# Patient Record
Sex: Male | Born: 1950
Health system: Southern US, Community
[De-identification: ages and names within clinical notes are randomized; demographics above are authoritative.]

## PROBLEM LIST (undated history)

## (undated) DIAGNOSIS — M549 Dorsalgia, unspecified: Secondary | ICD-10-CM

## (undated) DIAGNOSIS — I2510 Atherosclerotic heart disease of native coronary artery without angina pectoris: Secondary | ICD-10-CM

## (undated) DIAGNOSIS — J40 Bronchitis, not specified as acute or chronic: Secondary | ICD-10-CM

## (undated) DIAGNOSIS — T8859XA Other complications of anesthesia, initial encounter: Secondary | ICD-10-CM

## (undated) DIAGNOSIS — L853 Xerosis cutis: Secondary | ICD-10-CM

## (undated) DIAGNOSIS — E7800 Pure hypercholesterolemia, unspecified: Secondary | ICD-10-CM

## (undated) DIAGNOSIS — R0602 Shortness of breath: Secondary | ICD-10-CM

## (undated) DIAGNOSIS — M1990 Unspecified osteoarthritis, unspecified site: Secondary | ICD-10-CM

## (undated) DIAGNOSIS — G4730 Sleep apnea, unspecified: Secondary | ICD-10-CM

## (undated) DIAGNOSIS — G8929 Other chronic pain: Secondary | ICD-10-CM

## (undated) DIAGNOSIS — Z8719 Personal history of other diseases of the digestive system: Secondary | ICD-10-CM

## (undated) DIAGNOSIS — J189 Pneumonia, unspecified organism: Secondary | ICD-10-CM

## (undated) DIAGNOSIS — K219 Gastro-esophageal reflux disease without esophagitis: Secondary | ICD-10-CM

## (undated) DIAGNOSIS — T4145XA Adverse effect of unspecified anesthetic, initial encounter: Secondary | ICD-10-CM

## (undated) DIAGNOSIS — E119 Type 2 diabetes mellitus without complications: Secondary | ICD-10-CM

## (undated) DIAGNOSIS — I739 Peripheral vascular disease, unspecified: Secondary | ICD-10-CM

## (undated) HISTORY — PX: CORONARY ANGIOPLASTY: SHX604

## (undated) HISTORY — PX: SHOULDER SURGERY: SHX246

## (undated) HISTORY — PX: FOOT SURGERY: SHX648

## (undated) HISTORY — DX: Type 2 diabetes mellitus without complications: E11.9

## (undated) HISTORY — PX: UPPER GI ENDOSCOPY: SHX6162

---

## 1997-10-18 ENCOUNTER — Ambulatory Visit (HOSPITAL_COMMUNITY): Admission: RE | Admit: 1997-10-18 | Discharge: 1997-10-18 | Payer: Self-pay | Admitting: *Deleted

## 2000-11-17 ENCOUNTER — Encounter: Payer: Self-pay | Admitting: Emergency Medicine

## 2000-11-17 ENCOUNTER — Emergency Department (HOSPITAL_COMMUNITY): Admission: EM | Admit: 2000-11-17 | Discharge: 2000-11-17 | Payer: Self-pay | Admitting: Emergency Medicine

## 2000-11-22 ENCOUNTER — Encounter (INDEPENDENT_AMBULATORY_CARE_PROVIDER_SITE_OTHER): Payer: Self-pay

## 2000-11-22 ENCOUNTER — Ambulatory Visit (HOSPITAL_COMMUNITY): Admission: RE | Admit: 2000-11-22 | Discharge: 2000-11-22 | Payer: Self-pay | Admitting: *Deleted

## 2001-02-21 HISTORY — PX: OTHER SURGICAL HISTORY: SHX169

## 2001-11-05 ENCOUNTER — Encounter: Payer: Self-pay | Admitting: Internal Medicine

## 2001-11-05 ENCOUNTER — Encounter: Admission: RE | Admit: 2001-11-05 | Discharge: 2001-11-05 | Payer: Self-pay | Admitting: Internal Medicine

## 2001-11-12 ENCOUNTER — Encounter: Payer: Self-pay | Admitting: Internal Medicine

## 2001-11-12 ENCOUNTER — Ambulatory Visit (HOSPITAL_COMMUNITY): Admission: RE | Admit: 2001-11-12 | Discharge: 2001-11-13 | Payer: Self-pay | Admitting: Internal Medicine

## 2001-11-23 ENCOUNTER — Encounter: Payer: Self-pay | Admitting: Vascular Surgery

## 2001-11-27 ENCOUNTER — Encounter (INDEPENDENT_AMBULATORY_CARE_PROVIDER_SITE_OTHER): Payer: Self-pay | Admitting: *Deleted

## 2001-11-27 ENCOUNTER — Inpatient Hospital Stay (HOSPITAL_COMMUNITY): Admission: RE | Admit: 2001-11-27 | Discharge: 2001-12-04 | Payer: Self-pay | Admitting: Vascular Surgery

## 2001-11-27 ENCOUNTER — Encounter: Payer: Self-pay | Admitting: Vascular Surgery

## 2001-11-27 HISTORY — PX: AORTA - BILATERAL FEMORAL ARTERY BYPASS GRAFT: SHX1175

## 2001-11-28 ENCOUNTER — Encounter: Payer: Self-pay | Admitting: Vascular Surgery

## 2002-01-29 ENCOUNTER — Encounter: Payer: Self-pay | Admitting: Internal Medicine

## 2002-01-29 ENCOUNTER — Encounter: Admission: RE | Admit: 2002-01-29 | Discharge: 2002-01-29 | Payer: Self-pay | Admitting: Internal Medicine

## 2002-01-31 ENCOUNTER — Encounter: Payer: Self-pay | Admitting: Internal Medicine

## 2002-01-31 ENCOUNTER — Encounter: Admission: RE | Admit: 2002-01-31 | Discharge: 2002-01-31 | Payer: Self-pay | Admitting: Internal Medicine

## 2002-02-21 HISTORY — PX: BACK SURGERY: SHX140

## 2002-03-14 ENCOUNTER — Ambulatory Visit (HOSPITAL_COMMUNITY): Admission: RE | Admit: 2002-03-14 | Discharge: 2002-03-14 | Payer: Self-pay | Admitting: Neurosurgery

## 2002-03-14 ENCOUNTER — Encounter: Payer: Self-pay | Admitting: Neurosurgery

## 2002-07-01 ENCOUNTER — Encounter: Admission: RE | Admit: 2002-07-01 | Discharge: 2002-07-01 | Payer: Self-pay | Admitting: Neurosurgery

## 2002-07-01 ENCOUNTER — Encounter: Payer: Self-pay | Admitting: Neurosurgery

## 2002-07-01 ENCOUNTER — Encounter: Payer: Self-pay | Admitting: Diagnostic Radiology

## 2002-07-15 ENCOUNTER — Encounter: Admission: RE | Admit: 2002-07-15 | Discharge: 2002-07-15 | Payer: Self-pay | Admitting: Neurosurgery

## 2002-07-15 ENCOUNTER — Encounter: Payer: Self-pay | Admitting: Neurosurgery

## 2002-07-30 ENCOUNTER — Encounter: Payer: Self-pay | Admitting: Neurosurgery

## 2002-07-30 ENCOUNTER — Encounter: Admission: RE | Admit: 2002-07-30 | Discharge: 2002-07-30 | Payer: Self-pay | Admitting: Neurosurgery

## 2002-09-17 ENCOUNTER — Ambulatory Visit (HOSPITAL_COMMUNITY): Admission: RE | Admit: 2002-09-17 | Discharge: 2002-09-17 | Payer: Self-pay | Admitting: *Deleted

## 2002-09-17 ENCOUNTER — Encounter (INDEPENDENT_AMBULATORY_CARE_PROVIDER_SITE_OTHER): Payer: Self-pay | Admitting: Specialist

## 2002-10-01 ENCOUNTER — Encounter: Payer: Self-pay | Admitting: Neurosurgery

## 2002-10-03 ENCOUNTER — Inpatient Hospital Stay (HOSPITAL_COMMUNITY): Admission: RE | Admit: 2002-10-03 | Discharge: 2002-10-05 | Payer: Self-pay | Admitting: Neurosurgery

## 2002-10-03 ENCOUNTER — Encounter: Payer: Self-pay | Admitting: Neurosurgery

## 2003-05-06 ENCOUNTER — Encounter: Admission: RE | Admit: 2003-05-06 | Discharge: 2003-05-06 | Payer: Self-pay | Admitting: Neurosurgery

## 2004-02-10 ENCOUNTER — Ambulatory Visit (HOSPITAL_COMMUNITY): Admission: RE | Admit: 2004-02-10 | Discharge: 2004-02-10 | Payer: Self-pay | Admitting: *Deleted

## 2004-02-10 ENCOUNTER — Encounter (INDEPENDENT_AMBULATORY_CARE_PROVIDER_SITE_OTHER): Payer: Self-pay | Admitting: Specialist

## 2006-01-10 ENCOUNTER — Encounter: Admission: RE | Admit: 2006-01-10 | Discharge: 2006-01-10 | Payer: Self-pay | Admitting: Family Medicine

## 2006-02-03 ENCOUNTER — Encounter: Admission: RE | Admit: 2006-02-03 | Discharge: 2006-02-03 | Payer: Self-pay | Admitting: Neurosurgery

## 2006-05-22 ENCOUNTER — Encounter (INDEPENDENT_AMBULATORY_CARE_PROVIDER_SITE_OTHER): Payer: Self-pay | Admitting: Specialist

## 2006-05-22 ENCOUNTER — Ambulatory Visit (HOSPITAL_COMMUNITY): Admission: RE | Admit: 2006-05-22 | Discharge: 2006-05-22 | Payer: Self-pay | Admitting: *Deleted

## 2006-09-26 ENCOUNTER — Encounter: Admission: RE | Admit: 2006-09-26 | Discharge: 2006-09-26 | Payer: Self-pay | Admitting: Family Medicine

## 2006-10-09 ENCOUNTER — Ambulatory Visit (HOSPITAL_COMMUNITY): Admission: RE | Admit: 2006-10-09 | Discharge: 2006-10-10 | Payer: Self-pay | Admitting: Cardiology

## 2006-10-17 ENCOUNTER — Encounter: Admission: RE | Admit: 2006-10-17 | Discharge: 2006-10-17 | Payer: Self-pay | Admitting: Cardiology

## 2006-11-03 ENCOUNTER — Ambulatory Visit (HOSPITAL_COMMUNITY): Admission: RE | Admit: 2006-11-03 | Discharge: 2006-11-03 | Payer: Self-pay | Admitting: Cardiology

## 2007-05-29 ENCOUNTER — Encounter: Admission: RE | Admit: 2007-05-29 | Discharge: 2007-05-29 | Payer: Self-pay | Admitting: Neurosurgery

## 2007-08-23 ENCOUNTER — Ambulatory Visit (HOSPITAL_COMMUNITY): Admission: RE | Admit: 2007-08-23 | Discharge: 2007-08-23 | Payer: Self-pay | Admitting: *Deleted

## 2007-08-30 ENCOUNTER — Ambulatory Visit (HOSPITAL_COMMUNITY): Admission: RE | Admit: 2007-08-30 | Discharge: 2007-08-30 | Payer: Self-pay | Admitting: *Deleted

## 2007-08-30 ENCOUNTER — Encounter (INDEPENDENT_AMBULATORY_CARE_PROVIDER_SITE_OTHER): Payer: Self-pay | Admitting: *Deleted

## 2008-02-22 HISTORY — PX: CARDIAC CATHETERIZATION: SHX172

## 2008-07-08 ENCOUNTER — Encounter: Admission: RE | Admit: 2008-07-08 | Discharge: 2008-07-08 | Payer: Self-pay | Admitting: Family Medicine

## 2008-08-18 ENCOUNTER — Encounter: Admission: RE | Admit: 2008-08-18 | Discharge: 2008-08-18 | Payer: Self-pay | Admitting: Family Medicine

## 2008-08-24 ENCOUNTER — Inpatient Hospital Stay (HOSPITAL_COMMUNITY): Admission: EM | Admit: 2008-08-24 | Discharge: 2008-08-27 | Payer: Self-pay | Admitting: Emergency Medicine

## 2008-09-04 ENCOUNTER — Encounter (HOSPITAL_COMMUNITY): Admission: RE | Admit: 2008-09-04 | Discharge: 2008-12-03 | Payer: Self-pay | Admitting: Cardiology

## 2008-12-04 ENCOUNTER — Encounter (HOSPITAL_COMMUNITY): Admission: RE | Admit: 2008-12-04 | Discharge: 2009-02-18 | Payer: Self-pay | Admitting: Cardiology

## 2009-04-15 ENCOUNTER — Encounter: Admission: RE | Admit: 2009-04-15 | Discharge: 2009-04-15 | Payer: Self-pay | Admitting: Family Medicine

## 2009-08-04 HISTORY — PX: COLONOSCOPY: SHX174

## 2010-01-13 ENCOUNTER — Encounter: Admission: RE | Admit: 2010-01-13 | Discharge: 2010-01-13 | Payer: Self-pay | Admitting: Advanced Practice Midwife

## 2010-01-22 ENCOUNTER — Observation Stay (HOSPITAL_COMMUNITY)
Admission: EM | Admit: 2010-01-22 | Discharge: 2010-01-24 | Payer: Self-pay | Source: Home / Self Care | Admitting: Emergency Medicine

## 2010-05-03 LAB — CARDIAC PANEL(CRET KIN+CKTOT+MB+TROPI)
CK, MB: 1 ng/mL (ref 0.3–4.0)
Relative Index: 1.3 (ref 0.0–2.5)
Total CK: 71 U/L (ref 7–232)
Troponin I: 0.01 ng/mL (ref 0.00–0.06)
Troponin I: 0.02 ng/mL (ref 0.00–0.06)

## 2010-05-03 LAB — TROPONIN I: Troponin I: <0.01 ng/mL (ref 0.00–0.06)

## 2010-05-03 LAB — CULTURE, BLOOD (ROUTINE X 2)
Culture  Setup Time: 201112031729
Culture  Setup Time: 201112031729
Culture: NO GROWTH

## 2010-05-03 LAB — CBC
HCT: 40.5 % (ref 39.0–52.0)
Hemoglobin: 13.6 g/dL (ref 13.0–17.0)
Hemoglobin: 16.1 g/dL (ref 13.0–17.0)
MCHC: 33.6 g/dL (ref 30.0–36.0)
MCHC: 34.5 g/dL (ref 30.0–36.0)
MCV: 89.8 fL (ref 78.0–100.0)
RBC: 5.26 MIL/uL (ref 4.22–5.81)
RDW: 13.5 % (ref 11.5–15.5)
WBC: 11.5 10*3/uL — ABNORMAL HIGH (ref 4.0–10.5)

## 2010-05-03 LAB — URINALYSIS, ROUTINE W REFLEX MICROSCOPIC
Glucose, UA: NEGATIVE mg/dL
Ketones, ur: NEGATIVE mg/dL
Nitrite: NEGATIVE
Specific Gravity, Urine: 1.003 — ABNORMAL LOW (ref 1.005–1.030)
pH: 7 (ref 5.0–8.0)

## 2010-05-03 LAB — POCT CARDIAC MARKERS: Troponin i, poc: <0.05 ng/mL (ref 0.00–0.09)

## 2010-05-03 LAB — LIPID PANEL
LDL Cholesterol: 81 mg/dL (ref 0–99)
Total CHOL/HDL Ratio: 5.3 RATIO
VLDL: 38 mg/dL (ref 0–40)

## 2010-05-03 LAB — DIFFERENTIAL
Basophils Absolute: 0.1 10*3/uL (ref 0.0–0.1)
Eosinophils Absolute: 0.4 10*3/uL (ref 0.0–0.7)
Eosinophils Relative: 3 % (ref 0–5)
Lymphocytes Relative: 27 % (ref 12–46)
Monocytes Relative: 8 % (ref 3–12)
Neutro Abs: 7 10*3/uL (ref 1.7–7.7)
Neutrophils Relative %: 61 % (ref 43–77)

## 2010-05-03 LAB — BASIC METABOLIC PANEL
BUN: 14 mg/dL (ref 6–23)
Calcium: 8.8 mg/dL (ref 8.4–10.5)
Calcium: 9.8 mg/dL (ref 8.4–10.5)
Creatinine, Ser: 1.09 mg/dL (ref 0.4–1.5)
GFR calc Af Amer: >60 mL/min (ref >60)
GFR calc non Af Amer: >60 mL/min (ref >60)
GFR calc non Af Amer: >60 mL/min (ref >60)
Glucose, Bld: 137 mg/dL — ABNORMAL HIGH (ref 70–99)
Glucose, Bld: 82 mg/dL (ref 70–99)
Potassium: 4 mEq/L (ref 3.5–5.1)
Sodium: 140 mEq/L (ref 135–145)

## 2010-05-03 LAB — CK TOTAL AND CKMB (NOT AT ARMC)
CK, MB: 1.6 ng/mL (ref 0.3–4.0)
Relative Index: 1.6 (ref 0.0–2.5)
Total CK: 101 U/L (ref 7–232)

## 2010-05-03 LAB — URINE CULTURE
Colony Count: NO GROWTH
Culture  Setup Time: 201112021929

## 2010-05-03 LAB — BRAIN NATRIURETIC PEPTIDE: Pro B Natriuretic peptide (BNP): <30 pg/mL (ref 0.0–100.0)

## 2010-05-03 LAB — MRSA PCR SCREENING: MRSA by PCR: NEGATIVE

## 2010-05-30 LAB — CARDIAC PANEL(CRET KIN+CKTOT+MB+TROPI)
CK, MB: 0.7 ng/mL (ref 0.3–4.0)
CK, MB: 0.8 ng/mL (ref 0.3–4.0)
Total CK: 109 U/L (ref 7–232)

## 2010-05-30 LAB — CBC
HCT: 39.3 % (ref 39.0–52.0)
HCT: 41.4 % (ref 39.0–52.0)
HCT: 45.1 % (ref 39.0–52.0)
Hemoglobin: 13.4 g/dL (ref 13.0–17.0)
Hemoglobin: 14 g/dL (ref 13.0–17.0)
Hemoglobin: 15.6 g/dL (ref 13.0–17.0)
MCV: 92.9 fL (ref 78.0–100.0)
Platelets: 191 10*3/uL (ref 150–400)
RBC: 4.21 MIL/uL — ABNORMAL LOW (ref 4.22–5.81)
RBC: 4.24 MIL/uL (ref 4.22–5.81)
WBC: 6.4 10*3/uL (ref 4.0–10.5)
WBC: 7.1 10*3/uL (ref 4.0–10.5)
WBC: 7.4 10*3/uL (ref 4.0–10.5)
WBC: 8.5 10*3/uL (ref 4.0–10.5)

## 2010-05-30 LAB — COMPREHENSIVE METABOLIC PANEL
ALT: 14 U/L (ref 0–53)
Alkaline Phosphatase: 104 U/L (ref 39–117)
CO2: 24 mEq/L (ref 19–32)
Chloride: 107 mEq/L (ref 96–112)
GFR calc non Af Amer: >60 mL/min (ref >60)
Glucose, Bld: 85 mg/dL (ref 70–99)
Potassium: 4 mEq/L (ref 3.5–5.1)
Sodium: 138 mEq/L (ref 135–145)
Total Bilirubin: 0.9 mg/dL (ref 0.3–1.2)
Total Protein: 7.1 g/dL (ref 6.0–8.3)

## 2010-05-30 LAB — DIFFERENTIAL
Eosinophils Absolute: 0.1 10*3/uL (ref 0.0–0.7)
Eosinophils Relative: 1 % (ref 0–5)
Lymphocytes Relative: 27 % (ref 12–46)
Lymphs Abs: 2.3 10*3/uL (ref 0.7–4.0)
Monocytes Absolute: 0.5 10*3/uL (ref 0.1–1.0)

## 2010-05-30 LAB — POCT CARDIAC MARKERS
CKMB, poc: <1 ng/mL — ABNORMAL LOW (ref 1.0–8.0)
Myoglobin, poc: 48.4 ng/mL (ref 12–200)
Troponin i, poc: <0.05 ng/mL (ref 0.00–0.09)

## 2010-05-30 LAB — POCT I-STAT, CHEM 8
BUN: 9 mg/dL (ref 6–23)
Creatinine, Ser: 0.9 mg/dL (ref 0.4–1.5)
Potassium: 4 mEq/L (ref 3.5–5.1)
Sodium: 139 mEq/L (ref 135–145)
TCO2: 23 mmol/L (ref 0–100)

## 2010-05-30 LAB — BASIC METABOLIC PANEL
BUN: 5 mg/dL — ABNORMAL LOW (ref 6–23)
Chloride: 107 mEq/L (ref 96–112)
Potassium: 4.1 mEq/L (ref 3.5–5.1)

## 2010-05-30 LAB — HEPARIN LEVEL (UNFRACTIONATED): Heparin Unfractionated: 0.62 IU/mL (ref 0.30–0.70)

## 2010-05-30 LAB — PROTIME-INR: Prothrombin Time: 12.9 seconds (ref 11.6–15.2)

## 2010-05-30 LAB — CK TOTAL AND CKMB (NOT AT ARMC)
Relative Index: INVALID (ref 0.0–2.5)
Total CK: 80 U/L (ref 7–232)

## 2010-07-06 NOTE — Discharge Summary (Signed)
NAME:  Ray Brown, Ray Brown               ACCOUNT NO.:  [id number]   MEDICAL RECORD NO.:  [id number]          PATIENT TYPE:  INP   LOCATION:  2508                         FACILITY:  [facility]   PHYSICIAN:  Jake Bathe, MD      DATE OF BIRTH:  1950/08/31   DATE OF ADMISSION:  08/24/2008  DATE OF DISCHARGE:  08/27/2008                               DISCHARGE SUMMARY   FINAL DIAGNOSES:  1. Coronary artery disease.  2. Hypertension.  3. Hyperlipidemia.  4. Barrett esophagus.  5. Gastroesophageal reflux disease.  6. Hiatal hernia.  7. History of pneumonia.  8. Peripheral vascular disease, status post aortobifemoral bypass      graft.   PROCEDURES:  Cardiac catheterization performed on August 26, 2008,  demonstrated high-grade in-stent restenosis of mid right coronary  artery.  Drug-eluting stent to the mid right coronary artery was placed  - 2.75 x 23 mm XIENCE stent.  LVEF was 55% with no other abnormalities.   LABORATORIES ON DISCHARGE:  Creatinine 0.85 and potassium 4.1.  White  count 6.4, hemoglobin 14, hematocrit 41.4, and platelets 191.  CT  angiogram of the abdomen, pelvic, chest due to admission with severe  chest pain radiating to back showed no abnormalities with aorta or stent  or bypass grafts.   BRIEF HOSPITAL COURSE:  A 60 year old male with severe peripheral  vascular disease, status post aortobifemoral bypass in October 2003 with  coronary artery stenting with bare-metal stent in August 2008 to his  right coronary artery with hypertension, hyperlipidemia with severe mid  sternal pain radiating to his back which severe, felt as if something  had kicked him in the chest.  He was worked up as above with CT  angiogram showing no evidence of dissection.  He then went on to cardiac  catheterization where bare-metal stent showed in-stent restenosis and a  drug-eluting stent was placed.  He tolerated this procedure well.   PHYSICAL EXAMINATION:  VITAL SIGNS:  Temperature 97.9, pulse  57,  respirations 20, blood pressure 123/61, and sating 95% on room air.  GENERAL:  Alert and oriented x3, in no acute distress.  LUNGS:  Clear to auscultation bilaterally.  ABDOMEN:  Soft, nontender, and normoactive bowel sounds.  EXTREMITIES:  No clubbing, cyanosis, or edema.  Cath site clean, dry,  and intact, and ambulating well.   DISCHARGE MEDICATIONS:  1. Plavix 75 mg once a day.  2. Aspirin 325 mg a day.  3. Lipitor 20 mg a day.  4. Protonix 40 mg a day.  5. Niaspan 1000 mg a day.  6. Metoprolol tartrate 25 mg twice a day.  7. Lasix 20 mg a day.  8. NTG 0.4 mg SL PRN  9. Fish oil 1000 mg twice a day.   HOSPITAL FOLLOWUP:  He will be seeing me in 2 weeks posthospitalization.  He knows to contact me immediately if any problems arise with his  catheterization site.  Once again, discharge medications have been  reviewed and correlate with office medications.   Discharge time >31minutes.      Jake Bathe, MD  Electronically  Signed     MCS/MEDQ  D:  10/13/2008  T:  10/13/2008  Job:  [id number]

## 2010-07-06 NOTE — Cardiovascular Report (Signed)
NAME:  Ray Brown, Ray Brown               ACCOUNT NO.:  [id number]   MEDICAL RECORD NO.:  [id number]          PATIENT TYPE:  OIB   LOCATION:  6527                         FACILITY:  [facility]   PHYSICIAN:  Corky Crafts, MDDATE OF BIRTH:  November 28, 1950   DATE OF PROCEDURE:  DATE OF DISCHARGE:                            CARDIAC CATHETERIZATION   PROCEDURES PERFORMED:  PCI of the right coronary artery.   OPERATOR:  Dr. Eldridge Dace.   INDICATIONS:  Stable angina and peripheral arterial disease.   PROCEDURE NARRATIVE:  The diagnostic catheterization was performed by  Dr. Anne Fu.  This revealed an 80% mid-right coronary artery lesion.  A  JR-4 guiding catheter was used to engage the ostium of right coronary  artery.  A Prowater wire was placed across the lesion.  The lesion was a  short 80% lesion.  However, there is a small ectatic segment just past  the lesion.  Because this ectatic segment was out of proportion to the  rest of the vessel in terms of size, I thought a drug-eluting stent  would be difficult to apposed against all of the vessel wall.  Therefore, we went with a bare metal stent which I think would be easier  to endothelialize.  A 2.5 x 9-mm Maverick balloon was placed across the  lesion and inflated to 8 atmospheres for 29 seconds and then again to 8  atmospheres for 14 seconds.  A 2.5 x 16-mm Express stent was then placed  across the lesion.  It did cover the entire ectatic segment and extend  proximal to the lesion.  There was disease all the way up to an acute  marginal branch.  The stent was deployed at 14 atmospheres for 46  seconds.  There is a good angiographic result.  The ectatic segment  appeared to be well treated.  The tightest portion of the initial artery  was then post dilated with a 2.75 x 10 mm dura Star inflated to 20  atmospheres for 56 seconds and then to 20 atmospheres for 17 seconds to  post dilate the midportion of the stent.  There is a small irregularity  noted at the proximal edge of the stent.  This improved significantly  with 200 mcg of intracoronary nitroglycerin.  There was an excellent  angiographic result.  TIMI III flow was maintained throughout.  There  was no residual stenosis.   IMPRESSION:  1. Successful PCI of the right coronary artery with a bare metal      stent.  A bare metal stent was used because of the ectatic segment      as noted above.  2. Other moderate atherosclerosis including a 75% lesion in a small      posterolateral branch which was not amendable to PCI.   RECOMMENDATIONS:  The patient will continue with aspirin and Plavix  indefinitely.  He will be watched overnight.  Angiomax was used for  anticoagulation.  The sheath will be removed using manual compression.      Corky Crafts, MD  Electronically Signed     JSV/MEDQ  D:  10/09/2006  T:  10/10/2006  Job:  [id number]   cc:   Jake Bathe, MD  Donia Guiles, M.D.

## 2010-07-06 NOTE — Cardiovascular Report (Signed)
NAME:  Ray Brown, Ray Brown               ACCOUNT NO.:  [id number]   MEDICAL RECORD NO.:  [id number]          PATIENT TYPE:  INP   LOCATION:  2508                         FACILITY:  [facility]   PHYSICIAN:  Jake Bathe, MD      DATE OF BIRTH:  May 30, 1950   DATE OF PROCEDURE:  08/26/2008  DATE OF DISCHARGE:                            CARDIAC CATHETERIZATION   PROCEDURES:  1. Left heart catheterization.  2. Selective coronary angiography.  3. Left ventriculogram.   INDICATIONS:  A 60 year old male with unstable angina who was admitted  with severe chest pain radiating to his back.  He did not have any  evidence of dissection.  He has prior aortobifemoral bypasses.  Hyperlipidemia, peripheral vascular disease as above.  Yesterday, had a  vagal episode with 6-second pause while eating.   PROCEDURE DETAILS:  Informed consent was obtained.  Risk of stroke,  heart attack, death, renal impairment, bleeding, arterial damage, graft  damage were explained to the patient and his wife at length.  Lidocaine  1% was used for local anesthesia.  Visualization of femoral head with  fluoroscopy was obtained.  Using the modified Seldinger technique, a 5-  French sheath was placed into the graft right side.  A Judkins left #4  catheter and a Judkins right #4 catheter were used to selectively  cannulate the left coronary and right coronary artery.  Multiple views  with hand injection of Omnipaque were obtained.  An angled pigtail was  used crossing the left ventricle.  A 30 mL power injection shot in the  RAO position was performed for left ventriculogram.  Pullback was  obtained.  Hemodynamics obtained.  Following procedure, case was  discussed with Dr. Katrinka Blazing and the patient.   FINDINGS:  1. Left main artery.  No angiographically significant coronary artery      disease.  2. Left anterior descending artery - there are 3 diagonal branches,      small in caliber.  In the mid to distal segment, there is a  myocardial bridge present.  No angiographically significant      coronary artery disease.  There is some proximal calcification      noted.  3. Circumflex artery - there is some minor irregularity up to 30% in      the proximal circumflex region.  Otherwise, no angiographically      significant coronary artery disease.  4. Right coronary artery - there is 90% in-stent stenosis in the      previously placed 2.5 x 16-mm bare-metal stent, which was placed in      2008.  Minor irregularities throughout vessel.  5. Left ventriculogram - normal left ventricular ejection fraction of      55% with no wall motion abnormality.   HEMODYNAMICS:  Left ventricular systolic pressure 100 with an end-  diastolic pressure of 9 mmHg, aortic pressure 100/57 with a mean of 75  mmHg.   IMPRESSION:  Severe in-stent restenosis of right coronary artery mid  bare-metal stent of 90%.   PLAN:  Findings discussed with Dr. Verdis Prime, who will plan  percutaneous  intervention with drug-eluting stent.  Note:  The patient  does have a femoral bypass.      Jake Bathe, MD  Electronically Signed     MCS/MEDQ  D:  08/26/2008  T:  08/26/2008  Job:  [id number]   cc:   Donia Guiles, M.D.

## 2010-07-06 NOTE — Cardiovascular Report (Signed)
NAME:  Ray Brown, Ray Brown               ACCOUNT NO.:  [id number]   MEDICAL RECORD NO.:  [id number]          PATIENT TYPE:  OIB   LOCATION:  6527                         FACILITY:  [facility]   PHYSICIAN:  Jake Bathe, MD           DATE OF BIRTH:   DATE OF PROCEDURE:  10/09/2006  DATE OF DISCHARGE:                            CARDIAC CATHETERIZATION   INDICATIONS:  Mr. Ray Brown is a 60 year old male with peripheral arterial  disease status post aortofemoral bypass and right iliac stenting with  hyperlipidemia and former tobacco use, who was seen in consultation by  me and deemed to have stable angina.   Procedure: Patient was consented.  Risks and benefits of catheterization  were explained.  The patient was draped in a sterile fashion and placed  on the catheterization table.  His right groin was infiltrated with 1%  lidocaine for local anesthesia.  He was also given 1 mg of Versed and 25  mcg of fentanyl for mild conscious sedation.  Fluoroscopy of the groin  was performed to visualize femoral head.  Utilizing the modified  Seldinger technique, the right femoral artery was cannulated, and a #6  French sheath was placed in the right groin.  Utilizing a Wholey wire  and a JL4 catheter, selective angiography of the left coronary system  was performed with hand injections.  Catheter exchanges were made over  the wire.  A Judkins right 4 catheter was then used for selective  angiography of the right coronary artery, then 200 mcg of intracoronary  nitro was administered to the left coronary system via hand injection.  A pigtail catheter was then placed in the ascending aorta, then under  fluoroscopic guidance passed into the left ventricle.  Pressures were  recorded.  A left ventriculography was then performed with power  injection in the RAO position.  A pullback was performed across the  aortic valve.  Then an abdominal aortogram was performed with digital  subtraction angiography to visualize the  renal arteries.   FINDINGS:  1. Left main.  No significant disease.  Branches into LAD and left      circumflex.  2. Left anterior descending artery.  The mid segment of the left      anterior descending artery is somewhat small in caliber and did      expand slightly upon injection of intracoronary nitroglycerin.      There appears to be a small hinge point and/or a bridging of the      LAD.  The LAD continues to wrap around the apex.  There are 4      diagonal branches which are small in caliber, off  of the LAD.  3. Left circumflex.  There is a large obtuse marginal which is      branching off the circumflex system.  There are minor      irregularities throughout.  4. Right coronary artery.  There is heavy proximal calcification      noted.  There is a focal 80%  mid RCA lesion with an ectatic  aneurysmal segment.  There is a distal small-caliber posterolateral      branch with 75% stenosis not amenable to PCI.  5. Abdominal aortogram.  There is no renal artery stenosis.  6. Hemodynamics.  LV pressures were 108/4, with an LVEDP of 8.  Aortic      pressure was 108/64, with a mean of 82.  There was no gradient.  7. Left ventriculogram.  Normal LV systolic function, EF 60%.   IMPRESSIONS:  1. Coronary artery disease as described above, with a focal right      coronary artery 80% lesion which should be amenable to percutaneous      coronary intervention. I reviewed the films with Dr. Catalina Gravel,      who will perform percutaneous intervention with a bare metal stent      to his right coronary artery (see intervention note).  2. Normal left ventricular systolic function, with ejection fraction      of 60%.  3. No renal artery stenosis.  4. Will be placed on Plavix in addition to his other outpatient      medications including Lipitor, Niaspan, Toprol and aspirin.  Will      follow up with me in clinic after observation overnight.      Jake Bathe, MD  Electronically  Signed     MCS/MEDQ  D:  10/09/2006  T:  10/10/2006  Job:  [id number]   cc:   Donia Guiles, M.D.  Corky Crafts, MD

## 2010-07-06 NOTE — Op Note (Signed)
NAME:  Ray Brown, Ray Brown               ACCOUNT NO.:  [id number]   MEDICAL RECORD NO.:  [id number]          PATIENT TYPE:  AMB   LOCATION:  ENDO                         FACILITY:  [facility]   PHYSICIAN:  Georgiana Spinner, M.D.    DATE OF BIRTH:  1950-10-30   DATE OF PROCEDURE:  08/30/2007  DATE OF DISCHARGE:                               OPERATIVE REPORT   PROCEDURE:  Upper endoscopy with dilation and biopsy.   ANESTHESIA:  1. Fentanyl 50 mcg.  2. Versed 5 mg.   PROCEDURE:  With the patient mildly sedated in the left lateral  decubitus position, the Pentax videoscopic endoscope was inserted in the  mouth and passed under direct vision through the esophagus, which  appeared normal, until we reached distal esophagus, and there were  changes of Barrett's esophagus.  We entered into the stomach.  Fundus,  body, antrum, duodenal bulb, and second portion of the duodenum were  visualized.  From this point, the endoscope was slowly withdrawn, taking  circumferential views of the duodenal mucosa until the endoscope had  been pulled back into the stomach and placed in retroflexion to view the  stomach from below, and there appeared to be a tight wrap of the GE  junction around the endoscope, which was photographed.  The endoscope  was then straightened, and a guidewire was passed.  Subsequently, Savary  dilators 15 and 17 were passed rather easily, with no blood seen on  either dilator.  With the latter, the guidewire was removed.  The  endoscope was reinserted.  No bleeding was noted, and biopsies were  taken of the area of Barrett's esophagus.  The endoscope was withdrawn.  The patient's vital signs and pulse oximeter remained stable.  The  patient tolerated the procedure well, without apparent complications.   FINDINGS:  Barrett's esophagus, biopsied, with dilation of distal  esophagus to 15 and 17 Savary.   PLAN:  Await clinical response and biopsy report.  The patient will call  me for results  and follow up with me as needed as an outpatient.           ______________________________  Georgiana Spinner, M.D.     GMO/MEDQ  D:  08/30/2007  T:  08/30/2007  Job:  [id number]   cc:   Donia Guiles, M.D.  Fax: [phone]

## 2010-07-06 NOTE — Cardiovascular Report (Signed)
NAME:  Ray Brown, Ray Brown               ACCOUNT NO.:  [id number]   MEDICAL RECORD NO.:  [id number]          PATIENT TYPE:  INP   LOCATION:  2508                         FACILITY:  [facility]   PHYSICIAN:  Lyn Records, M.D.   DATE OF BIRTH:  24-Dec-1950   DATE OF PROCEDURE:  08/26/2008  DATE OF DISCHARGE:                            CARDIAC CATHETERIZATION   INDICATION:  The patient presented with atypical chest pain.  He had an  episode of sinus arrest with near syncope after being admitted to the  unit.  Markers have been negative.  Cath today by Dr. Anne Fu  demonstrated high-grade in-stent restenosis in the mid-right coronary in  the 80-90% range.   PROCEDURE PERFORMED:  Drug-eluting stent mid-right coronary artery in-  stent restenosis.   DESCRIPTION:  The diagnostic procedure was performed by Dr. Anne Fu.  We  discussed with the patient the proposed procedure of DES stenting of the  right coronary ISR region.   A 6-French side-hole right coronary artery guide catheter was used.  A  0.75 mg/kg bolus of Angiomax was given followed by 1.75 mg/kg/hour  infusion.  ACT was 460 seconds.  Plavix 300 mg was administered.   A BMW wire was used across the region of stenosis in the right coronary  artery.  A 10-mm long 2.75-mm cutting balloon was then used for  predilatation.  We then performed stenting of the mid right coronary  artery using a 2.75- x 23-mm Xience V.  The stent was deployed to 10  atmospheres.  We then postdilated with a 15-mm long x 3.0-mm Quantum  Apex balloon to 13 atmospheres.  Three balloon inflations were  performed.  Postprocedure, a beautiful angiographic result was noted.  A  0% stenosis was seen.  No dissection was noted.  TIMI III was noted.   Angiomax was discontinued poststenting.   CONCLUSION:  Successful PCI of the right coronary for ISR within a bare-  metal stent (deployed in 2008) from 85% to 0% with TIMI grade 3 flow.   PLAN:  Plavix for at least a year.   Further management per Dr. Anne Fu.      Lyn Records, M.D.  Electronically Signed     HWS/MEDQ  D:  08/26/2008  T:  08/26/2008  Job:  [id number]   cc:   Jake Bathe, MD  Donia Guiles, M.D.

## 2010-07-06 NOTE — Discharge Summary (Signed)
NAME:  Delpilar, Tasha               ACCOUNT NO.:  [id number]   MEDICAL RECORD NO.:  [id number]          PATIENT TYPE:  OIB   LOCATION:  6527                         FACILITY:  [facility]   PHYSICIAN:  Jake Bathe, MD      DATE OF BIRTH:  September 15, 1950   DATE OF ADMISSION:  10/09/2006  DATE OF DISCHARGE:  10/10/2006                               DISCHARGE SUMMARY   DISCHARGE DIAGNOSES:  1. Coronary artery disease status post bare-metal stent to the right      coronary artery.  2. Peripheral vascular disease.  3. Hyperlipidemia.  4. Former smoker, smoking cessation counseling.  5. Family history of coronary artery disease.   HOSPITAL COURSE:  Mr. Clayson is a 60 year old male patient who was  initially seen in the office on October 05, 2006 with angina.  He has a  strong family history of coronary artery disease with his brother  recently undergoing coronary artery bypass grafting.  Patient has  significant risk factors including peripheral vascular disease,  hyperlipidemia, age and sex.  Because of his symptoms and significant  cardiac risk factors, he was planned to be brought into [facility]  Cardiac Catheterization.   On October 09, 2006, the patient was brought in for cardiac  catheterization and found to have the following:  A focal 80% mid-RCA  stenosis with a small distal PL 75% stenosis; LAD showed minimal  irregularities as well as the circumflex.  The left main was normal, EF  was normal.  No renal artery stenosis.   The patient then underwent bare-metal stent placement to the right  coronary artery under the care of Dr. Everette Rank utilizing splenic  grafts, two Monorail stents 2.5 x 16 mm in length.  Patient remained in  the hospital and was ready for discharge the following day.   LAB STUDIES:  BUN 11, creatinine 1.03, potassium 3.9, hemoglobin 14.6,  hematocrit 40.8.   Patient was discharged home in stable and improved condition.   DISCHARGE MEDICATIONS:  1.  Enteric-coated aspirin 325 mg a day.  2. Plavix 75 mg a day for 30 days.  3. Aciphex 20 mg a day.  4. Lipitor 20 mg a day.  5. Niaspan 500 mg daily.  6. Sublingual nitroglycerin p.r.n. chest pain.  7. Metoprolol ER 25mg  a day.   Remain on a low sodium, heart-healthy diet.  Clean cath site gently with  soap and water, no scrubbing.  Increase activity slowly.  No driving for  two days.  No lifting over ten pounds for one week.   Follow up with Dr. Anne Fu on October 25, 2006 at 9:30 a.m.      Guy Franco, P.A.      Jake Bathe, MD  Electronically Signed    LB/MEDQ  D:  10/10/2006  T:  10/10/2006  Job:  [id number]   cc:   Donia Guiles, M.D.

## 2010-07-06 NOTE — H&P (Signed)
NAME:  Ray Brown, Ray Brown               ACCOUNT NO.:  [id number]   MEDICAL RECORD NO.:  [id number]          PATIENT TYPE:  EMS   LOCATION:  MAJO                         FACILITY:  [facility]   PHYSICIAN:  Georga Hacking, M.D.DATE OF BIRTH:  Feb 12, 1951   DATE OF ADMISSION:  08/24/2008  DATE OF DISCHARGE:                              HISTORY & PHYSICAL   REASON FOR ADMISSION:  Chest pain.   HISTORY:  This is 60 year old male has a known history of severe  peripheral vascular disease with a previous aortobifemoral bypass  grafting in October of 2003, and coronary artery disease with stenting  of the right coronary artery with a bare metal stent in August of 2008.  He has previously had a history of hypertension, hyperlipidemia, and  cigarette smoking but quit smoking when he had his aortobifemoral bypass  grafting.  He has been doing well, but when he came out of church today  had the onset of severe midsternal pain radiating through to his back,  which was severe and felt as if something had kicked him in the chest.  The discomfort then abated somewhat but never went away and has  continued, and he came to the emergency room where he was continuing to  have chest discomfort.  The pain definitely radiates to his back under  his scapula.  It radiated initially up into the neck somewhat.  He did  not have syncope or severe shortness of breath with it.  Initial cardiac  enzymes were negative and 2 EKGs were normal.  Because he was having  ongoing chest pain, I was asked to see him.   PAST MEDICAL HISTORY:  Remarkable for:  1. A known history of hypertension.  2. A previous history of hyperlipidemia.  3. Barrett's esophagitis.  4. Hiatal hernia.  5. Reflux disease.  6. A previous history of pneumonia.   PAST SURGICAL HISTORY:  1. Previous foot surgery for infections in 1981.  2. Previous aortobifemoral bypass grafting.   ALLERGIES:  None.   CURRENT MEDICATIONS:  Include Lipitor, Lasix,  Niaspan, Plavix,  metoprolol, Protonix, and aspirin.   FAMILY HISTORY:  Mother died of colon cancer.  Father died at age 61 of  diabetes.  One brother had bypass grafting at an early age.   SOCIAL HISTORY:  He is married with 3 children and is retired as a  Herbalist.  He has a greater than 50 pack-year  history of smoking but quit smoking in 2002.   REVIEW OF SYSTEMS:  No history of weight loss.  No eye, ear, nose, or  throat problems.  He has a history of pneumonia and bronchitis.  He has  a past history of esophageal reflux disease with a hiatal hernia and has  known Barrett's esophagus.  No recent GI or urinary symptoms.  No  history of stroke or TIA, no headaches.   PHYSICAL EXAMINATION:  GENERAL:  He is a pleasant male, who appears  mildly uncomfortable.  VITAL SIGNS:  His blood pressure was 115/73 in the right arm, pulse was  50.  SKIN:  Warm  and dry.  ENT: EOMI, PERRLA, CNS clear, fundi not examined, pharynx negative.  NECK:  Supple without masses, JVD, thyromegaly, or bruits.  LUNGS:  Clear to A and P.  CARDIAC:  Normal S1 and S2, no S3, no murmur.  ABDOMEN:  Soft and nontender.  A midline abdominal scar noted.  Bilateral femoral scars noted.  PULSES:  Present in the femoral area, reduced in the pedals, no edema is  noted.  Radial pulses appear equal bilaterally.  Carotids appear equal  bilaterally.  NEUROLOGIC:  Normal.   A 12-lead EKG initially was normal and a repeat EKG an hour and a half  later is normal.   LABORATORY DATA:  Shows a normal CBC.  D-dimer is 0.30.  Initial point-  of-care enzymes were negative.  Sodium is 139, potassium is 4, chloride  106, glucose is 88, BUN 9, and creatinine is 0.9.  Chest x-ray,  portable, shows questionable cardiomegaly, no definite acute change,  clear lungs.   IMPRESSION:  1. Severe midsternal pain initially radiating through to the back,      which is persistent with negative electrocardiogram and  enzymes.      Some features of the pain may suggest aortic dissection and this      will need to be looked for.  Other possibilities would include      acute coronary syndrome, but initial enzymes are negative.  2. Coronary artery disease with stenting of the right coronary artery      in 2008 with minimal disease in other vessels.  3. Hyperlipidemia under treatment.  4. History of esophageal reflux and Barrett's esophagitis.  5. Peripheral vascular disease with previous aortobifemoral bypass      grafting for severe claudication.   RECOMMENDATIONS:  Obtain CT angiogram of chest, rule out MI, serial CPK,  and EKGs.      Georga Hacking, M.D.  Electronically Signed     WST/MEDQ  D:  08/24/2008  T:  08/24/2008  Job:  [id number]   cc:   Donia Guiles, M.D.  Jake Bathe, MD

## 2010-07-09 NOTE — Op Note (Signed)
NAME:  Ray [name], Ray [name]               ACCOUNT NO.:  [id number]   MEDICAL RECORD NO.:  [id number]          PATIENT TYPE:  AMB   LOCATION:  ENDO                         FACILITY:  [facility]   PHYSICIAN:  Georgiana Spinner, M.D.    DATE OF BIRTH:  09/27/1950   DATE OF PROCEDURE:  05/22/2006  DATE OF DISCHARGE:                               OPERATIVE REPORT   PROCEDURE:  Upper endoscopy.   INDICATIONS:  GERD with Barrett esophagus.   ANESTHESIA:  Fentanyl 75 mcg, Versed 7.5 mg.   PROCEDURE:  With patient mildly sedated in the left lateral decubitus  position, the Pentax videoscopic endoscope was inserted in the mouth,  passed under direct vision through the esophagus, which appeared normal  until we reached the distal esophagus and there appeared to be Barrett  esophagus, photographed and multiple biopsies taken.  We entered into  the stomach.  The fundus, body, antrum appeared normal.  The duodenal  bulb showed minimal erythema.  The second portion of duodenum appeared  normal.  From this point, the endoscope was slowly withdrawn taking  circumferential views of the duodenal mucosa until the endoscope had  been pulled back into the stomach, placed in retroflexion and viewed the  stomach from below.  The endoscope was straightened and withdrawn,  taking circumferential views of the remaining gastric and esophageal  mucosa.  The patient's vital signs and pulse oximeter remained stable.  The patient tolerated the procedure well.  No apparent complications.   FINDINGS:  Changes of Barrett esophagus, biopsied.  Await biopsy report.  The patient will call me for results and follow up with me as an  outpatient.  Continue PPI therapy.  Minimal erythema of duodenal bulb  noted.           ______________________________  Georgiana Spinner, M.D.     GMO/MEDQ  D:  05/22/2006  T:  05/22/2006  Job:  [id number]   cc:   Donia Guiles, M.D.

## 2010-07-09 NOTE — Op Note (Signed)
NAME:  Ray [name], Ray [name]                           ACCOUNT NO.:  [id number]   MEDICAL RECORD NO.:  [id number]                   PATIENT TYPE:  INP   LOCATION:  2856                                 FACILITY:  [facility]   PHYSICIAN:  Larina Earthly, M.D.                 DATE OF BIRTH:  1951-02-19   DATE OF PROCEDURE:  11/27/2001  DATE OF DISCHARGE:                                 OPERATIVE REPORT   PREOPERATIVE DIAGNOSIS:  Aortoiliac occlusive disease.   POSTOPERATIVE DIAGNOSIS:  Aortoiliac occlusive disease.   PROCEDURE:  Aortobifemoral bypass with a 14 x 8 Hemashield graft.   SURGEON:  Larina Earthly, M.D.   ASSISTANTS:  Di Kindle. Edilia Bo, M.D., and Eber Hong, P.A.   ANESTHESIA:  General endotracheal.   COMPLICATIONS:  None.   DISPOSITION:  To recovery room, extubated and hemodynamically stable.   INDICATION FOR PROCEDURE:  The patient is a 60 year old gentleman with  progressive pain in his right leg with walking.  Preoperative noninvasive  vascular studies revealed bilateral occlusive disease.  He underwent  arteriography in the radiology department and was found to have severe  stenosis of the distal aorta above the level of the bifurcation.  The  procedure was complicated by dissection of the right iliac artery, and he  had stenting from the hypogastric artery takeoff into the distal external  iliac artery on the right.  I discussed options with the patient and  recommended aortofemoral bypass for better long-term patency.  I explained  the option of bilateral stents placed in the distal aorta into the iliac  arteries but explained that this would have a much less durable long-term  result.  The patient understands and wishes to proceed with surgery.   DESCRIPTION OF PROCEDURE:  The patient was taken to the operating room and  placed in the supine position, where the area of the abdomen and both groins  was prepped and draped in the usual sterile fashion.  An  incision made from  the level of the xiphoid to the umbilicus, carried down through the midline  fascia with electrocautery.  The abdomen was explored.  The liver, stomach,  small and large bowel were normal.  The Omnitract retractor was used for  exposure.  The duodenum was reflected to the right, and the aorta was  encircled with a vessel loop below the level of the renal arteries.  The  aorta had minimal atherosclerotic disease at this level.  Next separate  incisions were made obliquely over the femoral level bilaterally and carried  down to isolate the common, superficial femoral, and profunda femoris  arteries bilaterally.  There was extensive plaque disease in the common  femoral arteries bilaterally.  Tunnels were created from the level of the  groin to the aortic bifurcation bilaterally, taking care to pass behind the  level of the ureters bilaterally.  The patient was given  8000 units of  intravenous heparin and 25 g of mannitol.  After adequate circulation time  the aorta was occluded with a Harken clamp below the level of the renal  arteries, and the aorta was occluded slightly more distally above the  inferior mesenteric artery takeoff.  The aorta was transected at this level.  An ellipse of aorta was removed, and the distal aorta was oversewn with two  layers of 3-0 Prolene suture.  The distal clamp was removed.  Next the 14 x  8 Hemashield graft was brought onto the field, was cut to appropriate length  and using a felt strip for reinforcement was sewn end-to-end to the aorta  with a running 3-0 Prolene suture.  A felt strip was used for reinforcement.  The anastomosis was tested and found to be adequate.  The right and left  limbs of the graft were brought to the appropriate groin, and the common,  superficial, and profunda femoris arteries were occluded bilaterally.  The  common femoral arteries were opened bilaterally and extended longitudinally  with Potts scissors onto  the superficial femoral artery takeoff.  Grafts  were cut to the appropriate length, were spatulated and sewn end-to-side to  the junction of the common and superficial femoral arteries bilaterally with  running 5-0 Prolene sutures.  Prior to completion of each anastomosis, the  usual flushing maneuvers were undertaken.  The anastomosis was completed and  clamps were removed, restoring flow to the groins bilaterally.  The patient  was given 50 mg of protamine to reverse the heparin.  The wounds were  irrigated with saline, hemostasis with electrocautery.  The wounds were  closed with 3-0 Vicryl in the subcutaneous and subcuticular tissue, Benzoin  and Steri-Strips were applied.                                               Larina Earthly, M.D.    TFE/MEDQ  D:  11/27/2001  T:  11/27/2001  Job:  [id number]   cc:   Ike Bene, M.D.  [address]  [place]  [place] [address]  Fax: [phone]   Art A. Hoss, M.D.  [address]  [place], [place] [address]

## 2010-07-09 NOTE — Procedures (Signed)
[facility]  Patient:    OSBORNE, SERIO Visit Number: [id number] MRN: [id number]          Service Type: END Location: ENDO Attending Physician:  Sabino Gasser Proc. Date: 11/22/00 Admit Date:  11/22/2000                             Procedure Report  PROCEDURE:  Upper endoscopy with biopsy.  INDICATIONS:  Gastroesophageal reflux disease.  ANESTHESIA:  Demerol 50 mg, Versed 5 mg.  DESCRIPTION OF PROCEDURE:  With the patient mildly sedated in the left lateral decubitus position, the Olympus videoscopic endoscope was inserted into the mouth and passed under direct vision through the esophagus.  There were changes of mild irritation, photographed and biopsied, consistent with esophagitis.  We entered into the stomach.  Fundus and body were erythematous, and there was a demarcation between the body of the stomach and the antrum. This was photographed, and the erythematous area was biopsied.  We entered into the duodenal bulb, and it too showed changes of duodenitis, photographed. The second portion of the duodenum was normal.  From this point, the endoscope was slowly withdrawn, taking circumferential views of the entire duodenal mucosa until the endoscope then pulled back into the stomach and placed in retroflexion to view the stomach from below.  The endoscope was then straightened and withdrawn taking circumferential views of the entire gastric and subsequently the esophageal mucosa which otherwise appeared normal.  The patients vital signs and pulse oximeter remained stable.  The patient tolerated the procedure well without apparent complications.  FINDINGS: 1. Esophagitis, biopsied. 2. Gastritis, biopsied. 3. Duodenitis.  PLAN:  Await biopsy report.  The patient call me for the results and follow up with me as an outpatient.  Proceed to colonoscopy as planned. Attending Physician:  Sabino Gasser DD:  11/22/00 TD:  11/22/00 Job:  [id number]

## 2010-07-09 NOTE — Op Note (Signed)
NAME:  Ray [name], Ray [name]               ACCOUNT NO.:  [id number]   MEDICAL RECORD NO.:  [id number]          PATIENT TYPE:  AMB   LOCATION:  ENDO                         FACILITY:  [facility]   PHYSICIAN:  Georgiana Spinner, M.D.    DATE OF BIRTH:  10-10-1950   DATE OF PROCEDURE:  02/10/2004  DATE OF DISCHARGE:                                 OPERATIVE REPORT   PROCEDURE:  Upper endoscopy with biopsy.   ENDOSCOPIST:  Georgiana Spinner, M.D.   INDICATIONS:  Gastroesophageal reflux disease with Barrett's esophagus.   ANESTHESIA:  Demerol 40 mg, Versed 5 mg.   DESCRIPTION OF PROCEDURE:  With the patient mildly sedated in the left  lateral decubitus position, the Olympus videoscopic endoscope was inserted  into the mouth, passed under direct vision through the esophagus, and there  were changes of Barrett's esophagus, photographed and biopsied as we entered  into the stomach.  The fundus, biopsy, antrum, duodenal bulb, second portion  of the duodenum were visualized.  From this point, the endoscope was slowly  withdrawn taking  circumferential views of duodenal mucosa until the  endoscope was pulled back into the stomach, placed in retroflexion to view  the stomach from below.  The endoscope was then straightened and withdrawn  taking circumferential views of the remaining gastric and esophageal mucosa.  The patient's vital signs and pulse oximeter remained stable.  The patient  tolerated the procedure well without apparent complications.   FINDINGS:  1.  Changes of Barrett's esophagus, biopsied.  Await biopsy report.   PLAN:  The patient is to call me for results and follow up with me as an  outpatient.       GMO/MEDQ  D:  02/10/2004  T:  02/10/2004  Job:  [id number]   cc:   Ike Bene, M.D.  [address]  [place]  [place] [address]  Fax: [phone]

## 2010-07-09 NOTE — Discharge Summary (Signed)
NAMEBRENDEN, Ray [name]                           ACCOUNT NO.:  [id number]   MEDICAL RECORD NO.:  [id number]                   PATIENT TYPE:  INP   LOCATION:  2034                                 FACILITY:  [facility]   PHYSICIAN:  Larina Earthly, M.D.                 DATE OF BIRTH:  1950-03-04   DATE OF ADMISSION:  11/27/2001  DATE OF DISCHARGE:  12/03/2001                                 DISCHARGE SUMMARY   PRIMARY ADMITTING DIAGNOSES:  1. Aortoiliac occlusive disease.  2. Progressive bilateral lower extremity claudication.   ADDITIONAL DIAGNOSES:  1. Hyperlipidemia.  2. History of hiatal hernia/gastroesophageal reflux disease.   PROCEDURE PERFORMED:  Aortobifemoral bypass with 14 x 8 Hemashield graft.   HISTORY:  The patient is a 60 year old male who, over the past two years,  has had progressive bilateral lower extremity claudication symptoms.  He  does not have rest pain but notices that prolonged walking causes severe  pain in his hips, groin, buttocks and his legs.  He initially had  noninvasive vascular study which showed evidence of occlusive disease  bilaterally.  He underwent arteriography in the radiology department and was  found to have 70-80% stenosis of the distal abdominal aorta with mild  disease in the right external iliac artery.  The procedure was complicated  by dissection of the right iliac artery and he had stenting of hypogastric  artery take-off into the distal external iliac artery on the right.  He was  seen by Dr. Kristen Loader. Early and it was felt that he would benefit from  surgical revascularization.   HOSPITAL COURSE:  He was admitted on November 27, 2001 and taken to the  operating room where he underwent an aortobifemoral bypass with 14 x 8  Hemashield graft.  He tolerated the procedure well and was transferred to  the SICU in stable condition.  He was hemodynamically stable by postop day  1, therefore was placed in the ICU for further observation.  At that  time,  he was having larger than expected output from his NG tube, therefore, it  was kept in place.  By postop day 2, the output had decreased and his NG  tube was removed.  He was stable and doing well overall and was able at that  time to be transferred to the floor.  Postoperatively, he has done well.  By  postop day 4, he was having small bowel movements and passing flatus without  difficulty.  He was initially started on a clear liquid diet and has been  slowly advanced, as his bowel function has returned, to a regular diet.  He  is at the present, tolerating a regular diet without difficulty.  Again, his  bowel function has slowly returned.  He has been ambulating in the halls  without difficulty.  He has remained afebrile and all vital signs have been  stable.  He has been weaned off supplemental oxygen and is maintaining O2  saturations between 90% and 95% on room air.  His incisions are healing  well.  He has strong peripheral pulses bilaterally.  His pain is well-  controlled with p.o. pain medication.  It is felt that if he continues to  remain stable, he will be ready for discharge home on December 03, 2001.   DISCHARGE MEDICATIONS:  1. Pepcid 20 mg b.i.d.  2. Vicodin 5/500 mg one to two q.4h. p.r.n. for pain.  3. Aspirin 81 mg every day.  4. Lipitor 20 mg every day.   DISCHARGE INSTRUCTIONS:  He is to refrain from driving, heavy lifting or  strenuous activity.  He may continue daily walking and he is to use his  incentive spirometer.  He is asked to continue a regular diet.  He may  shower daily and clean his incisions with soap and water.   DISCHARGE FOLLOWUP:  The [facility] office will call and schedule a followup  appointment with Dr. Arbie Cookey in three weeks and have repeat ankle brachial  indices at that time.  Of note, his postoperative ABIs were greater than 1  bilaterally.  He is asked to call our office if he experiences any redness,  swelling or drainage from the  incision site or fever greater than 101.     Coral Ceo, [place]                          Larina Earthly, M.D.    GC/MEDQ  D:  12/02/2001  T:  12/04/2001  Job:  [id number]   cc:   Ike Bene, M.D.  [address]  [place]  [place] [address]  Fax: [phone]

## 2010-07-09 NOTE — Procedures (Signed)
[facility]  Patient:    Ray [name], Ray [name] Visit Number: [id number] MRN: [id number]          Service Type: END Location: ENDO Attending Physician:  Sabino Gasser Proc. Date: 11/22/00 Admit Date:  11/22/2000                             Procedure Report  PROCEDURE:  Colonoscopy.  INDICATIONS:  Colon polyps, rectal bleeding.  ANESTHESIA:  Demerol 10, Versed 1 mg additionally.  DESCRIPTION OF PROCEDURE:  With the patient mildly sedated in the left lateral decubitus position, the Olympus videoscopic colonoscope was inserted in the rectum after a normal rectal exam and passed under direct vision to the cecum, identified by the ileocecal valve and the crows foot of the cecum. After exploring the cecum and clearing it of some fecal debris, the colonoscope was then slowly withdrawn taking circumferential views of the entire colonic mucosa until we reached the rectum.  We stopped only at 30 cm from the anal verge, where proximally in that area three polyps were seen adjacent to each other.  The largest was removed with snare cautery technique, the other two with hot biopsy forceps technique setting of 20-20 blended current.  There were some diverticula noticed in this area as well.  The endoscope, as stated, was pulled back to the rectum which showed a polyp.  It, too, was removed using snare cautery technique, setting of 20-20 blended current, and there were three smaller polyps in the rectum seen on retroflex view only.  One was removed using hot biopsy forceps technique.  The two much smaller ones were just eradicated using the hot biopsy forceps, all with a setting of 20-20 blended current.  The endoscope was then straightened and withdrawn.  The patients vital signs and pulse oximeter remained stable.  The patient tolerated the procedure well without apparent complications.  FINDINGS:  Scattered rare diverticula throughout the colon, more so as typically,  on the left side.  Polyps at 30 cm within the rectum.  Await biopsy report.  The patient will call me for results and follow up with me as an outpatient.  Avoid aspirin therapy or any NSAIDs for approximately two weeks. Attending Physician:  Sabino Gasser DD:  11/22/00 TD:  11/22/00 Job: [id number]

## 2010-07-09 NOTE — H&P (Signed)
Ray [name], Ray [name]                           ACCOUNT NO.:  [id number]   MEDICAL RECORD NO.:  [id number]                   PATIENT TYPE:  INP   LOCATION:  2856                                 FACILITY:  [facility]   PHYSICIAN:  Larina Earthly, M.D.                 DATE OF BIRTH:  30-Oct-1950   DATE OF ADMISSION:  11/27/2001  DATE OF DISCHARGE:                                HISTORY & PHYSICAL   PRIMARY CARE PHYSICIAN:  Ike Bene, M.D., [place], [place].   PRESENTING CIRCUMSTANCE:  Aortoiliac occlusive disease.   HISTORY OF PRESENT ILLNESS:  The patient is a 60 year old male with history  of progressive bilateral lower extremity claudication, greater than two year  history which involves the hips, the groins, buttocks and legs.  He finds  that his symptoms are aggravated by prolonged walking and that they are  relieved by rest.  He does not have rest pain.  He has no lower extremity  ischemic changes.  No varicosities.  He is status post an aortogram done  November 12, 2001.  This study showed the right renal arteries were patent.  There was a 70% to 80% stenosis of the distal abdominal aorta.  There was  mild disease in the right external iliac artery.  It was noted that the  right external iliac artery was dissected from the right internal iliac  artery to the distal portion of the right external iliac artery.  During the  aortogram two stents were placed.  The left external iliac artery was widely  patent.  Ankle branchial indexes were also obtained November 05, 2001 and  varied from rest to after exercising.  On the right they went from 0.8 to  0.52; on the left 0.81 to 0.66.  The patient was scheduled to admit to [facility] on November 27, 2001.  He will undergo aortobifemoral  bypass by Dr. Gretta Began.   PAST MEDICAL HISTORY:  1. Hypercholesterolemia.  2. History of bronchitis and pneumonia.  3. Hiatal hernia, gastroesophageal reflux  disease.   PAST SURGICAL HISTORY:  The patient is status post foot surgery for  infections in 1981.  Patient is status post colonoscopy X2.   MEDICATIONS:  Include Lipitor 20 mg q.d. at bedtime and ibuprofen 20 mg 4X  daily which currently has been discontinued.   ALLERGIES:  The patient has no known drug allergies.   REVIEW OF SYMPTOMS:  CARDIAC:  Patient has no prior history of coronary  artery disease.  No history of myocardial infarction.  No history of  dysrhythmias or angina.  No history of syncope.  He does not paroxysmal  nocturnal dyspnea nor does he have orthopnea.  PULMONARY:  He has history of  pneumonia and bronchitis but no history of asthma or chronic obstructive  pulmonary disease.  GASTROINTESTINAL:  He has history of gastroesophageal  reflux disease with hiatal  hernia.  No history of peptic ulcer disease.  No  history of hemoptysis or hematemesis.  He has no current weight gain or  loss.  He has no history of fever or chills. NEUROLOGICAL:  He has no  history of cerebrovascular accident or transient ischemic attack.  No  amaurosis fugax.  No presyncope or dizziness.  He has no prior history of  congestive heart failure or hypertension.   SOCIAL HISTORY:  Patient is married.  He has three children.  He works as a  Data processing manager person. He has a history of tobacco habituation,  smoking two packs per day for the last 37 years.  He quit one year ago.  He  does not partake of alcoholic beverages.   FAMILY HISTORY:  Mother died of colon cancer.  Father died age 72, had  diabetes.  He has one brother who is status post coronary artery bypass  grafting at an early age.   PHYSICAL EXAMINATION:  GENERAL:  An alert, oriented male in no acute  distress.  VITAL SIGNS:  Taken at [facility] on preadmission  physical.  HEENT:  Normocephalic, atraumatic.  Eyes: Pupils equal, round, reactive to  light. Extraocular movements intact.  He does not present  with macular  degeneration or arcus senilis.  NECK:  Supple.  No jugular venous distention.  No carotid bruits are  auscultated.  No cervical lymphadenopathy.  CHEST:  Symmetrical on inspiration.  There are no wheezes, rhonchi or rales.  LUNGS:  Clear to auscultation and percussion bilaterally.  HEART:  Regular rate and rhythm without murmurs.  ABDOMEN:  Soft, nondistended, nontender.  Bowel sounds are present  throughout.  No masses palpable.  No abdominal bruits auscultated.  EXTREMITIES:  Show no evidence of cyanosis, clubbing or edema.  He has no  varicosities in the lower extremities.  No ulcerations.  He has palpable  pedal pulses at the dorsalis pedis region bilaterally.  Both feet are warm.  Carotid upstrokes 2+ bilaterally.  Femoral pulses 4/4 bilaterally.  He has  evidence of aortogram which was done November 20, 2001.  There is a small  hematoma in the left groin which is nontender and is only palpable with  careful palpation.   IMPRESSION:  1. Aortoiliac occlusive disease.  2. Progressive bilateral lower extremity claudication.   PLAN:  Aortobifemoral bypass by Dr. Gretta Began, November 27, 2001.     Maple Mirza, P.A.                    Larina Earthly, M.D.    GM/MEDQ  D:  11/23/2001  T:  11/27/2001  Job:  [id number]

## 2010-07-09 NOTE — Op Note (Signed)
   NAME:  [name], Ray                           ACCOUNT NO.:  [id number]   MEDICAL RECORD NO.:  [id number]                   PATIENT TYPE:  AMB   LOCATION:  ENDO                                 FACILITY:  [facility]   PHYSICIAN:  Georgiana Spinner, M.D.                 DATE OF BIRTH:  10-09-1950   DATE OF PROCEDURE:  09/17/2002  DATE OF DISCHARGE:                                 OPERATIVE REPORT   PROCEDURE:  Upper endoscopy with biopsy.   INDICATIONS:  Known Barrett's esophagus by biopsy.   ANESTHESIA:  Demerol 70 mg, Versed 7 mg.   DESCRIPTION OF PROCEDURE:  With the patient mildly sedated in the left  lateral decubitus position, the Olympus videoscopic endoscope was inserted  in the mouth, passed under direct vision through the esophagus, which  appeared normal until we reached the distal esophagus, and there was clear  evidence of Barrett's seen, photographed, and multiple biopsies were taken.  It was difficult to ascertain that we were actually getting the areas of  Barrett's clearly because of a lot of esophageal peristalsis and the patient  also started to cough and have hiccups, but we biopsied as best we could.  We entered into the stomach.  Fundus, body, antrum all were normal.  Duodenal bulb showed mild change of duodenitis.  Second-portion duodenum was  normal.  From this point the endoscope was slowly withdrawn, taking  circumferential views of the duodenal mucosa until the endoscope had been  pulled back into the stomach, placed in retroflexion to view the stomach  from below.  The endoscope was then straightened and withdrawn, taking  circumferential views of the remaining gastric and esophageal mucosa.  The  patient's vital signs and pulse oximetry remained stable.  The patient  tolerated the procedure well and without apparent complications.   FINDINGS:  1. Barrett's esophagus.  2. Mild duodenitis.   Await biopsy report.  The patient will call me for results and follow  up  with me as an outpatient.                                                Georgiana Spinner, M.D.    GMO/MEDQ  D:  09/17/2002  T:  09/17/2002  Job:  [id number]

## 2010-07-09 NOTE — Op Note (Signed)
NAME:  KOBI, MARIO               ACCOUNT NO.:  [id number]   MEDICAL RECORD NO.:  [id number]          PATIENT TYPE:  AMB   LOCATION:  ENDO                         FACILITY:  [facility]   PHYSICIAN:  Georgiana Spinner, M.D.    DATE OF BIRTH:  [birth year]   DATE OF PROCEDURE:  05/22/2006  DATE OF DISCHARGE:                               OPERATIVE REPORT   SURGEON:  Georgiana Spinner, M.D.   PROCEDURE:  Colonoscopy.   INDICATIONS:  Colon polyp, colon cancer screening.   ANESTHESIA:  Fentanyl 25 mcg, Versed 2.5 mg.   DESCRIPTION OF PROCEDURE:  With the patient mildly sedated in the left  lateral decubitus position, a rectal exam was performed, which was  unremarkable to my examination.  Subsequently, the Olympus videoscopic  colonoscope was inserted into the rectum and passed under direct vision  to the cecum, identified by the ileocecal valve and appendiceal orifice,  both of which were photographed.  From this point, the colonoscope was  slowly withdrawn, taking circumferential views of colonic mucosa,  stopping in the sigmoid colon at approximately 30 cm from the anal  verge, at which point a small polyp was seen, photographed and removed  using hot biopsy forceps technique at a setting of 20/200 blended  current.  There were diverticula noted in this area as well, which were  photographed.  And then we withdrew all the way to the rectum, which  appeared normal.  And the rectum showed small hemorrhoids on retroflex  view.  The endoscope was straightened and withdrawn.  The patient's  vital signs and pulse oximetry remained stable.  The patient tolerated  the procedure well and without apparent complications.   FINDINGS:  1. Small polyp at 30 cm from the anal verge.  2. Internal hemorrhoids.  3. Diverticulosis, mild, of the sigmoid colon.   PLAN:  Await biopsy report.  The patient will call me for results and  follow up with me as needed as an outpatient.     ______________________________  Georgiana Spinner, M.D.     GMO/MEDQ  D:  05/22/2006  T:  05/22/2006  Job:  [id number]   cc:   Donia Guiles, M.D.

## 2010-07-09 NOTE — Op Note (Signed)
NAME:  Ray [name], Ray [name]                           ACCOUNT NO.:  [id number]   MEDICAL RECORD NO.:  [id number]                   PATIENT TYPE:  INP   LOCATION:  3005                                 FACILITY:  [facility]   PHYSICIAN:  Donalee Citrin, M.D.                     DATE OF BIRTH:  09-29-1950   DATE OF PROCEDURE:  10/03/2002  DATE OF DISCHARGE:                                 OPERATIVE REPORT   PREOPERATIVE DIAGNOSIS:  Diskogenic mechanical back pain L3-4, L4-5,  spondylosis L3-4 and L4-5 with biforaminal stenosis, right greater than left  at L4 and L5.   POSTOPERATIVE DIAGNOSIS:  Diskogenic mechanical back pain L3-4, L4-5,  spondylosis L3-4 and L4-5 with biforaminal stenosis, right greater than left  at L4 and L5.   OPERATION PERFORMED:  Radical decompressive laminectomy L3-4, L4-5,  posterior lumbar interbody fusion L3-4 and L4-5 using 12 x 26 mm tangent  allograft wedges, pedical screw fixation L3-4, L4-5 using Legacy pedicle  screw system with 6.5 x 45 pedicle screws inserted at all six pedicles.  Posterolateral arthrodesis using locally harvested autograft L3-4, L4-5 and  placement of medium Hemovac drain.   SURGEON:  Donalee Citrin, M.D.   ASSISTANT:  Reinaldo Meeker, M.D.   ANESTHESIA:  General endotracheal.   INDICATIONS FOR PROCEDURE:  The patient is a very pleasant 60 year old  gentleman who has had longstanding back pain going on for a couple of years  progressively worse with radiation down both legs in what appeared to be an  L4 and L5 distribution.  The patient has been refractory to all forms of  conservative treatment.  His pain was predominantly diskogenic and  mechanical in nature worst going from a lying to sitting and sitting to  standing position.  His back pain was much, much greater than his leg pain.  His films showed degenerative disk disease and spondylosis at L3-4 and L4-5  with biforaminal stenosis.  Due to the patient's failure of conservative  treatment  and his symptomatology being prominently back pain, mechanical in  nature with secondary radiculopathy, the patient is recommended  decompression and stabilization procedure.  I extensively went over the  risks and benefits of surgery with him. He understands and agreed to proceed  forward.   DESCRIPTION OF PROCEDURE:  The patient was brought to the operating room and  placed under general anesthesia, positioned prone on a Wilson frame, back  prepped and draped in the usual sterile fashion.  Preoperative x-ray  localized the L3-4 disk space and a midline incision was made after  infiltration of 10mL lidocaine with epinephrine.  Bovie electrocautery was  used to take down subcutaneous tissues.  Subperiosteal dissection was  carried out to the limit of L3, 4, and 5 bilaterally.  A self-retaining  retractor was placed and the transverse processes of L3, 4, and 5 were also  exposed, then radical  decompressive laminectomy was begun after  intraoperative x-ray confirmed localization of the L4 pedicle.  Leksell  rongeur was used to remove the spinous processes of L3 and L4 with removal  of the inferior aspect of the lamina of L3-4 bilaterally.  Then using a high  speed drill the facet complex of the L3-4 and L4-5 were removed.  Then using  a 3 and 4 mm Kerrison punch the remainder of the laminectomy was completed.  Radical medial facetectomies exposing the L3, L4 and L5 nerve roots. There  was noted to be a large amount of facet arthropathy and degenerative facet  disease predominantly at L4-5 and this was much worse on the right side than  it was on the left.  This was also evident at L3-4 with incompetence of the  facet joint and diastasis.  After all the medial facets were removed and the  L3, 4, 5 nerve roots were adequately decompressed out their neural foramina,  attention was taken to pedicle screw placement.  The pilot holes were  drilled in the pedicle at L3 on the right and cannulated  with the awl,  tapped with a 5.5 tap and 6.5 x 45 pedicle screw inserted.  All pedicles  were probed in a 360-degree orientation and direct intracanalicular  inspection confirmed no medial breach.  The remainder of the pedicle screws  were inserted in a similar fashion at L4 and L5 on the right as well as L3,  4, 5 on the left.  After this, D'Errico nerve root retractor was used to  reflect the right L4 nerve root medially.  Annulotomy was made with an 11  blade scalpel, pituitary rongeurs were used to adequately clean out  degenerated disk as well as bulbous disk fragment that was compressing the  proximal aspect of the L4 nerve root.  Then a 12 distractor was inserted.  This had good apposition with the end plates on the left side.  Disk was  adequately cleaned out, cut with a size 12 cutter and chisel and the 12 x 26  mm tangent allograft was inserted on the left side.  Then attention taken  back to the right side.  Disk space was again adequately cleaned out, cut  with a size 12 cutter and chisel.  Epstein curet used to scrape the central  end plates and a large amount of central disk was also removed from the L3-4  disk space with the __________  and locally harvested autograft was packed  against the allograft on the left side and right-sided allograft was  inserted.  Both allografts were inserted approximately 2 mm deep to a  posterior vertebral body line and confirmed by fluoroscopy.  At the L4-5  disk space the procedure was repeated with distractor inserted on the left  side, tangent on the right and then locally harvested allograft inserted on  the left and the tangent allograft inserted on the left.  This procedure was  repeated in a similar fashion as the L3-4 disk space.  All grafts were noted  to be in good position and end plates had been scraped and noted to have  good apposition with all bone grafts. Then the wound was copiously irrigated.  Meticulous hemostasis was  maintained.  The lateral gutters and  transverse processes were aggressively decorticated.  The remainder of the  locally harvested allograft was packed in the lateral gutters on the  transverse processes and lateral facet complexes.  Then a size 60 mm rod  was  sized, selected, and inserted, felt to be a nice fit, tightened down and  torqued down at L5.  The pedicle screw at L4 was compressed against L5 and  L3 against L4.  Then a 321 Crosslink was inserted. All screws and rods and  bone grafts were noted to be in excellent position on postoperative  fluoroscopy.  Medium Hemovac drain was placed.  Gelfoam was overlaid on top  of the dura.  Meticulous hemostasis was maintained.  Muscle and fascia  reapproximated with 0 interrupted Vicryl and the subcutaneous tissue was  closed with 2-0 interrupted Vicryl and the skin was closed with running 4-0  subcuticular.  Benzoin and Steri-Strips applied.  The patient was then  transferred to the recovery room in stable condition.  At the end of the  case all sponge, needle and instrument counts were correct.                                               Donalee Citrin, M.D.    GC/MEDQ  D:  10/03/2002  T:  10/03/2002  Job:  [id number]

## 2010-10-31 ENCOUNTER — Emergency Department (HOSPITAL_COMMUNITY): Payer: 59

## 2010-10-31 ENCOUNTER — Inpatient Hospital Stay (HOSPITAL_COMMUNITY)
Admission: EM | Admit: 2010-10-31 | Discharge: 2010-11-01 | DRG: 313 | Disposition: A | Discharge status: Home / Self Care | Payer: 59 | Attending: Cardiology | Admitting: Cardiology

## 2010-10-31 DIAGNOSIS — I10 Essential (primary) hypertension: Secondary | ICD-10-CM | POA: Diagnosis present

## 2010-10-31 DIAGNOSIS — I498 Other specified cardiac arrhythmias: Secondary | ICD-10-CM | POA: Diagnosis present

## 2010-10-31 DIAGNOSIS — I2510 Atherosclerotic heart disease of native coronary artery without angina pectoris: Secondary | ICD-10-CM | POA: Diagnosis present

## 2010-10-31 DIAGNOSIS — I70209 Unspecified atherosclerosis of native arteries of extremities, unspecified extremity: Secondary | ICD-10-CM | POA: Diagnosis present

## 2010-10-31 DIAGNOSIS — Z9861 Coronary angioplasty status: Secondary | ICD-10-CM

## 2010-10-31 DIAGNOSIS — Z7982 Long term (current) use of aspirin: Secondary | ICD-10-CM

## 2010-10-31 DIAGNOSIS — R0789 Other chest pain: Principal | ICD-10-CM | POA: Diagnosis present

## 2010-10-31 DIAGNOSIS — Z87891 Personal history of nicotine dependence: Secondary | ICD-10-CM

## 2010-10-31 DIAGNOSIS — R079 Chest pain, unspecified: Secondary | ICD-10-CM

## 2010-10-31 DIAGNOSIS — Z79899 Other long term (current) drug therapy: Secondary | ICD-10-CM

## 2010-10-31 DIAGNOSIS — Z7902 Long term (current) use of antithrombotics/antiplatelets: Secondary | ICD-10-CM

## 2010-10-31 DIAGNOSIS — K449 Diaphragmatic hernia without obstruction or gangrene: Secondary | ICD-10-CM | POA: Diagnosis present

## 2010-10-31 DIAGNOSIS — E785 Hyperlipidemia, unspecified: Secondary | ICD-10-CM | POA: Diagnosis present

## 2010-10-31 DIAGNOSIS — K219 Gastro-esophageal reflux disease without esophagitis: Secondary | ICD-10-CM | POA: Diagnosis present

## 2010-10-31 LAB — BASIC METABOLIC PANEL
BUN: 11 mg/dL (ref 6–23)
CO2: 25 mEq/L (ref 19–32)
Chloride: 109 mEq/L (ref 96–112)
Glucose, Bld: 95 mg/dL (ref 70–99)
Potassium: 4.2 mEq/L (ref 3.5–5.1)

## 2010-10-31 LAB — CBC
HCT: 39.8 % (ref 39.0–52.0)
Hemoglobin: 13.8 g/dL (ref 13.0–17.0)
MCHC: 34.7 g/dL (ref 30.0–36.0)
RBC: 4.48 MIL/uL (ref 4.22–5.81)
WBC: 7.6 10*3/uL (ref 4.0–10.5)

## 2010-10-31 LAB — DIFFERENTIAL
Basophils Absolute: 0 10*3/uL (ref 0.0–0.1)
Lymphocytes Relative: 33 % (ref 12–46)
Monocytes Absolute: 0.6 10*3/uL (ref 0.1–1.0)
Neutro Abs: 4.2 10*3/uL (ref 1.7–7.7)
Neutrophils Relative %: 55 % (ref 43–77)

## 2010-10-31 LAB — POCT I-STAT TROPONIN I
Troponin i, poc: 0 ng/mL (ref 0.00–0.08)
Troponin i, poc: 0 ng/mL (ref 0.00–0.08)

## 2010-10-31 LAB — CK TOTAL AND CKMB (NOT AT ARMC)
CK, MB: 4.7 ng/mL — ABNORMAL HIGH (ref 0.3–4.0)
Relative Index: 1.6 (ref 0.0–2.5)

## 2010-10-31 LAB — D-DIMER, QUANTITATIVE: D-Dimer, Quant: 0.51 ug/mL-FEU — ABNORMAL HIGH (ref 0.00–0.48)

## 2010-10-31 MED ORDER — IOHEXOL 300 MG/ML  SOLN: 80.0000 mL | Freq: Once | INTRAMUSCULAR | Status: AC | PRN

## 2010-11-01 LAB — CARDIAC PANEL(CRET KIN+CKTOT+MB+TROPI)
CK, MB: 3.1 ng/mL (ref 0.3–4.0)
Relative Index: 1.8 (ref 0.0–2.5)
Troponin I: <0.3 ng/mL (ref <0.30)

## 2010-11-01 LAB — CK TOTAL AND CKMB (NOT AT ARMC)
CK, MB: 3.6 ng/mL (ref 0.3–4.0)
Relative Index: 1.6 (ref 0.0–2.5)

## 2010-11-01 LAB — PROTIME-INR: Prothrombin Time: 12.6 seconds (ref 11.6–15.2)

## 2010-11-01 NOTE — H&P (Signed)
NAMEKATRELL, [name]               ACCOUNT NO.:  [id number]  MEDICAL RECORD NO.:  [id number]  LOCATION:  3729                         FACILITY:  [facility]  PHYSICIAN:  Zacarias Pontes, MD       DATE OF BIRTH:  03-Oct-1950  DATE OF ADMISSION:  10/31/2010 DATE OF DISCHARGE:                             HISTORY & PHYSICAL   PRIMARY CARDIOLOGIST:  Loraine Leriche C. Anne Fu, MD  CHIEF COMPLAINT:  Chest pain.  HISTORY OF PRESENT ILLNESS:  Mr. Ray Brown is a 60 year old gentleman with coronary artery disease requiring PCI to his RCA most recently in 2010 who presents with abrupt onset chest pain earlier today.  The patient was at church this morning and while seated, he developed chest pain radiating to both sides of his chest and to his back.  He experienced some associated nausea.  He denies any associated diaphoresis or shortness of breath.  Pain lasted 20-30 minutes and was similar to his pain years ago that led to his PCI.  The patient says that he has had a recent cough with some URI symptoms in the last week, but that he has not had any fevers nor any productive cough or sputum.  He denies PND, orthopnea, or any recent leg swelling.  He denies palpitations or syncope.  Upon arrival to the ED, he became chest pain free after administration of aspirin and sublingual nitroglycerin.  PAST MEDICAL HISTORY: 1. Coronary artery disease status post PCI to his RCA in July 2010 for     90% in-stent restenosis in that vessel.  That cath also revealed     30% proximal disease in his left circumflex.  An LV gram done at     that time revealed a normal ejection fraction. 2. Esophagitis. 3. Hypertension. 4. Dyslipidemia. 5. Peripheral vascular disease status post aortofemoral bypass. 6. GERD. 7. Hiatal hernia. 8. History of facial cellulitis.  SOCIAL HISTORY:  Remote tobacco having quit 8 years ago.  He is accompanied in the ER tonight by his wife.  FAMILY HISTORY:  Noncontributory.  REVIEW OF SYSTEMS:   As per HPI, otherwise is comprehensively negative.  ALLERGIES:  No known drug allergies.  MEDICATIONS: 1. Aspirin 81 mg daily. 2. Plavix 75 mg daily. 3. Metoprolol 12.5 mg p.o. b.i.d. 4. Pantoprazole 40 mg daily. 5. Lasix 20 mg p.o. p.r.n. 6. Atorvastatin 80 mg daily.  PHYSICAL EXAMINATION:  VITAL SIGNS:  Heart rate is 62 and a blood pressure of 132/78 with a respiratory rate of 15 and oxygen saturations of 97% on room air. GENERAL:  He is in no acute distress with no use of accessory muscles to assist in his breathing. HEENT:  Notable for a supple neck with no masses or lymphadenopathy. His JVP is normal. CARDIAC:  Notable for a normal S1, S2 with a regular rate and no murmurs or rubs appreciated. LUNGS:  Clear to auscultation bilaterally with no crackles or wheezing appreciated. ABDOMEN:  Soft, nontender, nondistended with positive bowel sounds and no abdominal bruits. EXTREMITIES:  Warm and well perfused with no edema appreciated. NEUROLOGICAL:  He is alert and oriented x3 with no focal neurologic deficits detected.  LABORATORY DATA:  His white count is 7,  hematocrit 39, platelets 226,000.  Sodium 141, potassium 4.2, chloride 109, bicarb 25, BUN 11, creatinine 0.7 with a glucose of 95.  Initial set of cardiac enzymes showed CK of 300, CK-MB of 4.7, and troponin of 0.0.  A D-dimer was mildly elevated at 0.51.  His ECG showed normal sinus rhythm with no ST-segment deviation.  A CT of his chest with IV contrast did not show any evidence of pulmonary embolism.  Of note, he received 80 mL of IV contrast to the setting.  IMPRESSIONS:  This is a 60 year old gentleman with known history of coronary artery disease with 20-30 minutes of chest pain earlier today similar to his past angina, concerning for unstable angina.  We will admit to telemetry on IV heparin for serial enzymes and ECG acquisition.  If his enzymes and ECGs evolve, we will plan to take him to the cath lab for  invasive angiography in particular focus on his stented RCA.  However, if his enzymes and ECGs remain unremarkable with no return of his symptoms until tomorrow, we can then discontinue the heparin and pursue a provocative noninvasive test as a way to assess whether he indeed has inducible ischemia with particular attention to the territory supplied by his stented RCA.  Given his exposure to IV contrast today for his CT of the chest performed in the emergency room, we will hydrate him gently with IV fluids in the event that he ends up requiring more contrast during this admission.  The patient and his wife voiced understanding to the plan and their questions were answered to the best of my ability.          ______________________________ Zacarias Pontes, MD     DM/MEDQ  D:  11/01/2010  T:  11/01/2010  Job:  [id number]  Electronically Signed by Zacarias Pontes MD on 11/01/2010 04:19:04 AM

## 2010-11-04 NOTE — Discharge Summary (Signed)
  Ray Brown, Ray Brown               ACCOUNT NO.:  [id number]  MEDICAL RECORD NO.:  [id number]  LOCATION:  3729                         FACILITY:  [facility]  PHYSICIAN:  Jake Bathe, MD      DATE OF BIRTH:  24-Mar-1950  DATE OF ADMISSION:  10/31/2010 DATE OF DISCHARGE:                              DISCHARGE SUMMARY   ADDENDUM.  MEDICATIONS:  His medications also include: 1. Trilipix 135 mg once a day. 2. As well as niacin sustained release 1000 mg once a day. 3. Fish oil 2 caps b.i.d.     Jake Bathe, MD     MCS/MEDQ  D:  11/01/2010  T:  11/01/2010  Job:  [id number]  Electronically Signed by Donato Schultz MD on 11/04/2010 06:14:50 AM

## 2010-11-04 NOTE — Discharge Summary (Signed)
Ray Brown, Ray Brown               ACCOUNT NO.:  [id number]  MEDICAL RECORD NO.:  [id number]  LOCATION:  3729                         FACILITY:  [facility]  PHYSICIAN:  Jake Bathe, MD      DATE OF BIRTH:  [birth year]  DATE OF ADMISSION:  10/31/2010 DATE OF DISCHARGE:  11/01/2010                              DISCHARGE SUMMARY   CARDIOLOGIST:  Jake Bathe, MD  FINAL DIAGNOSES: 1. Chest pain. 2. Coronary artery disease with recent catheterization in 2010, with     drug-eluting stent placed to right coronary artery due to in-stent     restenosis from 2008. 3. Gastroesophageal reflux disease. 4. Prior esophagitis with clindamycin. 5. Hypertension. 6. Prior syncopal syncope, vasovagal. 7. Hiatal hernia. 8. History of facial cellulitis. 9. Peripheral vascular disease status post aortobifemoral bypass. 10.Dyslipidemia. 11.Hypertension, prior smoker quit 8 years ago.  BRIEF HOSPITAL COURSE:  A 60 year old male with coronary artery disease status post two prior interventions to his right coronary artery first of which was in 2008, with a bare-metal stent, the second which was in 2010, with a drug-eluting stent within that bare-metal stent secondary to in-stent restenosis who was sent to the [facility] Emergency Department from church after developing sudden onset severe chest discomfort substernal radiating both sides of his chest into his back. While in the emergency department, this pain was not relieved immediately with nitroglycerin or morphine, in fact it took him several hours before his pain subsided.  He did state that when turning in his bed to the right side while lying down, the pain changed in character somewhat.  He states that he is currently pain free this morning, however, he feels  a very subtle change when he twists his body.  D-dimer was positive in the ED therefore, a CT scan was performed which demonstrated no evidence of pulmonary embolism, no evidence of  aortic dissection, and less than 4-mm tiny scattered nodules in the right upper lung in which the radiologist stated that one may consider a repeat CT scan in 12 months to document stability or resolution.  LABORATORY DATA:  Cardiac markers thus far are all negative.  Creatinine is 0.7, hemoglobin 13.8, white count 7.6, platelet count 226.  In the last week, he has had some mild cough but no fevers or productive sputum.  No heart failure like symptoms.  In the ED, the EKG showed sinus bradycardia rate 53 with no ST-segment changes.  No new Q-waves developed.  EKG in the morning was the same. These were both personally reviewed.  After discussion with the patient and if cardiac markers from earlier this morning are negative with concomitant silent EKG and resolution of chest discomfort with some slight atypical features such as twisting his body and worsening pain, I feel comfortable allowing him to be discharged and to follow up as an outpatient with a prompt nuclear stress test.  I am having this established currently.  We will continuewith his current medications which will include nitroglycerin p.r.n.  DISCHARGE MEDICATIONS: 1. Aspirin 81 mg a day. 2. Plavix 75 mg a day. 3. Metoprolol 12.5 mg twice a day. 4. Pantoprazole 40 mg a day. 5. Lasix  20 mg p.r.n. 6. Atorvastatin 80 mg a day. 7. Nitroglycerin 0.4 mg sublingual p.r.n.  I have instructed him to seek prompt medical attention if his chest pain returns or becomes more severe.  Certainly, with his prior coronary artery disease, he may still bear possibilities of once again in-stent restenosis of the right coronary artery, however, I do not have any objective evidence of myocardial infarction or EKG changes.  Up until the point in church, he was able to complete all activities at home without any difficulty.  Residual disease was 30% in the left circumflex on prior catheterization with normal EF.  Discharge time was 35  minutes spent with the patient med reconciliation, review of labs, medical records, prior catheterization, and the patient instruction.     Jake Bathe, MD     MCS/MEDQ  D:  11/01/2010  T:  11/01/2010  Job:  [id number]  Electronically Signed by Donato Schultz MD on 11/04/2010 06:14:44 AM

## 2010-11-12 ENCOUNTER — Ambulatory Visit (HOSPITAL_COMMUNITY)
Admission: RE | Admit: 2010-11-12 | Discharge: 2010-11-12 | Disposition: A | Discharge status: Home / Self Care | Payer: 59 | Source: Ambulatory Visit | Attending: Cardiology | Admitting: Cardiology

## 2010-11-12 DIAGNOSIS — I2510 Atherosclerotic heart disease of native coronary artery without angina pectoris: Secondary | ICD-10-CM | POA: Insufficient documentation

## 2010-11-12 DIAGNOSIS — Z9861 Coronary angioplasty status: Secondary | ICD-10-CM | POA: Insufficient documentation

## 2010-11-12 DIAGNOSIS — R079 Chest pain, unspecified: Secondary | ICD-10-CM | POA: Insufficient documentation

## 2010-11-19 NOTE — Cardiovascular Report (Signed)
NAME:  Ray Brown, Ray Brown               ACCOUNT NO.:  [id number]  MEDICAL RECORD NO.:  [id number]  LOCATION:  [facility]                         FACILITY:  [facility]  PHYSICIAN:  Jake Bathe, MD      DATE OF BIRTH:  08-22-1950  DATE OF PROCEDURE:  11/12/2010 DATE OF DISCHARGE:                           CARDIAC CATHETERIZATION   PROCEDURE:  Left heart catheterization via the right radial artery approach.  INDICATIONS:  Abnormal nuclear stress test, prior coronary artery disease with two separate stents in the right coronary artery, the first one bare-metal, the second one drug-eluting after in-stent re-stenosis who has been having chest pain radiating to his back at church, was admitted over the hospital stay, negative troponin.  CT of chest negative for emboli, anxious.  His nuclear stress test did show some inferior wall ischemia and therefore, he is being sent for catheterization.  He had some inferior wall hypokinesis as well on this stress test.  Informed consent was obtained.  PROCEDURE DETAILS:  Informed consent was obtained.  Risk of stroke, heart attack, death, renal impairment, arterial damage, bleeding were explained to the patient at length.  Questions were answered, alternative treatments were discussed.  A 5-French hydrophilic sheath was inserted in the right radial artery without difficulty after 1% lidocaine was used for local anesthesia.  A 3 mg Verapamil was inserted into the sheath and 4000 units of heparin was administered after the aortic arch was successfully traversed.  A Judkins left 3.5 catheter was used to of successfully cannulate the left main artery.  Multiple views with hand injection of Omnipaque were obtained.  The Judkins right catheter was difficult and could not selectively engage the right eye. I then tried a no torque Williams right, which also was unsuccessful and then, we used a Medtronic ERAD right catheter, which was successful cannulating the right  coronary artery.  Multiple views with hand injection of Omnipaque were then obtained.  Left ventriculogram utilizing 25 mL contrast was then successfully performed in the RAO position with power injection.  FINDINGS: 1. Left main artery branched into the LAD and circumflex.  No     angiographically significant disease is present. 2. LAD.  There are four diagonal branches, first of which is large in     caliber than the other small caliber branches.  Mild irregularities     throughout this vessel.  No angiographically significant disease is     present. 3. Circumflex artery.  This vessel gives rise to three obtuse marginal     branches more distally and two very small obtuse marginal branches     in its proximal/mid section.  Minor luminal irregularities     throughout. 4. Right coronary artery.  The previously placed stents are widely     patent and just distal to the stent, there was a minor step-down;     however, PDA bifurcation is normal and reassuring. 5. Left ventriculogram.  Multiple ectopic beats were present during     the left ventriculogram; however, that does not appear to be any     significant wall motion abnormality present especially in the     inferior wall as was described by  nuclear stress test.  EF appears     to be normal in the 60% range.  HEMODYNAMICS:  Left ventricular systolic pressure is 115 with an end- diastolic pressure of 16 mmHg.  Aortic pressure 113/62 with a mean of 83 mmHg.  Following procedure, sheath was removed.  Allen's test was normal pre and post procedure, Terumo T band was used for hemostasis.  IMPRESSION: 1. Previously placed stent in the right coronary artery distribution,     both bare-metal and drug-eluting for in-stent restenosis are widely     patent.  Other arteries demonstrate only minor luminal     irregularities. 2. Normal left ventricular ejection fraction of 60% and there was no     apparent inferior wall motion abnormality.   There were several     successive beats of ectopy during left ventriculogram, which     reduces sensitivity. 3. No mitral regurgitation present and no significant aortic valve     gradient.  PLAN:  Continue with aggressive medical management, reassurance.  We will see the patient back in 1 week for postcatheterization followup.     Jake Bathe, MD     MCS/MEDQ  D:  11/12/2010  T:  11/12/2010  Job:  [id number]  Electronically Signed by Donato Schultz MD on 11/19/2010 06:49:16 AM

## 2010-12-03 LAB — CBC
HCT: 40.8
MCHC: 34.7
MCV: 88.6
RBC: 4.61

## 2010-12-03 LAB — BASIC METABOLIC PANEL
Creatinine, Ser: 1.03
Glucose, Bld: 109 — ABNORMAL HIGH
Potassium: 3.9
Sodium: 141

## 2011-02-28 DIAGNOSIS — E782 Mixed hyperlipidemia: Secondary | ICD-10-CM | POA: Diagnosis not present

## 2011-02-28 DIAGNOSIS — Z79899 Other long term (current) drug therapy: Secondary | ICD-10-CM | POA: Diagnosis not present

## 2011-04-01 DIAGNOSIS — H00019 Hordeolum externum unspecified eye, unspecified eyelid: Secondary | ICD-10-CM | POA: Diagnosis not present

## 2011-04-01 DIAGNOSIS — G5600 Carpal tunnel syndrome, unspecified upper limb: Secondary | ICD-10-CM | POA: Diagnosis not present

## 2011-05-02 DIAGNOSIS — H43399 Other vitreous opacities, unspecified eye: Secondary | ICD-10-CM | POA: Diagnosis not present

## 2011-06-24 DIAGNOSIS — J209 Acute bronchitis, unspecified: Secondary | ICD-10-CM | POA: Diagnosis not present

## 2011-07-14 DIAGNOSIS — J209 Acute bronchitis, unspecified: Secondary | ICD-10-CM | POA: Diagnosis not present

## 2011-07-14 DIAGNOSIS — H9209 Otalgia, unspecified ear: Secondary | ICD-10-CM | POA: Diagnosis not present

## 2011-08-15 DIAGNOSIS — I2510 Atherosclerotic heart disease of native coronary artery without angina pectoris: Secondary | ICD-10-CM | POA: Diagnosis not present

## 2011-08-15 DIAGNOSIS — R42 Dizziness and giddiness: Secondary | ICD-10-CM | POA: Diagnosis not present

## 2011-08-15 DIAGNOSIS — I70209 Unspecified atherosclerosis of native arteries of extremities, unspecified extremity: Secondary | ICD-10-CM | POA: Diagnosis not present

## 2011-08-15 DIAGNOSIS — E782 Mixed hyperlipidemia: Secondary | ICD-10-CM | POA: Diagnosis not present

## 2011-08-30 ENCOUNTER — Other Ambulatory Visit: Payer: Self-pay | Admitting: Family Medicine

## 2011-08-30 DIAGNOSIS — M5430 Sciatica, unspecified side: Secondary | ICD-10-CM | POA: Diagnosis not present

## 2011-08-30 DIAGNOSIS — M545 Low back pain: Secondary | ICD-10-CM | POA: Diagnosis not present

## 2011-09-04 ENCOUNTER — Ambulatory Visit
Admission: RE | Admit: 2011-09-04 | Discharge: 2011-09-04 | Disposition: A | Discharge status: Home / Self Care | Payer: Medicare Other | Source: Ambulatory Visit | Attending: Family Medicine | Admitting: Family Medicine

## 2011-09-04 DIAGNOSIS — M5430 Sciatica, unspecified side: Secondary | ICD-10-CM

## 2011-09-29 DIAGNOSIS — M47817 Spondylosis without myelopathy or radiculopathy, lumbosacral region: Secondary | ICD-10-CM | POA: Diagnosis not present

## 2011-09-29 DIAGNOSIS — M48061 Spinal stenosis, lumbar region without neurogenic claudication: Secondary | ICD-10-CM | POA: Diagnosis not present

## 2011-09-29 DIAGNOSIS — M5137 Other intervertebral disc degeneration, lumbosacral region: Secondary | ICD-10-CM | POA: Diagnosis not present

## 2011-09-29 DIAGNOSIS — IMO0002 Reserved for concepts with insufficient information to code with codable children: Secondary | ICD-10-CM | POA: Diagnosis not present

## 2011-10-17 ENCOUNTER — Other Ambulatory Visit: Payer: Self-pay | Admitting: Neurosurgery

## 2011-11-03 ENCOUNTER — Encounter (HOSPITAL_COMMUNITY): Payer: Self-pay | Admitting: Pharmacist

## 2011-11-03 NOTE — Pre-Procedure Instructions (Signed)
[address] [name]  11/03/2011   Your procedure is scheduled on:  Friday November 18, 2011.  Report to [facility] at 0530 AM.  Call this number if you have problems the morning of surgery: [phone]   Remember:   Do not eat food or drink:After Midnight.    Take these medicines the morning of surgery with A SIP OF WATER: Hydrocodone (Vicodin) if needed for pain, and Pantoprazole (Protonix)    Do not wear jewelry  Do not wear lotions or colognes.  Men may shave face and neck.  Do not bring valuables to the hospital.  Contacts, dentures or bridgework may not be worn into surgery.  Leave suitcase in the car. After surgery it may be brought to your room.  For patients admitted to the hospital, checkout time is 11:00 AM the day of discharge.   Patients discharged the day of surgery will not be allowed to drive home.  Name and phone number of your driver:   Special Instructions: CHG Shower Use Special Wash: 1/2 bottle night before surgery and 1/2 bottle morning of surgery.   Please read over the following fact sheets that you were given: Pain Booklet, Coughing and Deep Breathing, Blood Transfusion Information, MRSA Information and Surgical Site Infection Prevention

## 2011-11-04 ENCOUNTER — Encounter (HOSPITAL_COMMUNITY)
Admission: RE | Admit: 2011-11-04 | Discharge: 2011-11-04 | Disposition: A | Discharge status: Home / Self Care | Payer: 59 | Source: Ambulatory Visit | Attending: Neurosurgery | Admitting: Neurosurgery

## 2011-11-04 ENCOUNTER — Encounter (HOSPITAL_COMMUNITY): Payer: Self-pay

## 2011-11-04 DIAGNOSIS — Z01811 Encounter for preprocedural respiratory examination: Secondary | ICD-10-CM | POA: Diagnosis not present

## 2011-11-04 HISTORY — DX: Unspecified osteoarthritis, unspecified site: M19.90

## 2011-11-04 HISTORY — DX: Gastro-esophageal reflux disease without esophagitis: K21.9

## 2011-11-04 HISTORY — DX: Other chronic pain: G89.29

## 2011-11-04 HISTORY — DX: Pure hypercholesterolemia, unspecified: E78.00

## 2011-11-04 HISTORY — DX: Dorsalgia, unspecified: M54.9

## 2011-11-04 HISTORY — DX: Personal history of other diseases of the digestive system: Z87.19

## 2011-11-04 HISTORY — DX: Sleep apnea, unspecified: G47.30

## 2011-11-04 HISTORY — DX: Xerosis cutis: L85.3

## 2011-11-04 HISTORY — DX: Pneumonia, unspecified organism: J18.9

## 2011-11-04 HISTORY — DX: Bronchitis, not specified as acute or chronic: J40

## 2011-11-04 HISTORY — DX: Peripheral vascular disease, unspecified: I73.9

## 2011-11-04 HISTORY — DX: Atherosclerotic heart disease of native coronary artery without angina pectoris: I25.10

## 2011-11-04 LAB — ABO/RH: ABO/RH(D): O POS

## 2011-11-04 LAB — BASIC METABOLIC PANEL
CO2: 24 mEq/L (ref 19–32)
GFR calc non Af Amer: 87 mL/min — ABNORMAL LOW (ref >90)
Glucose, Bld: 98 mg/dL (ref 70–99)
Potassium: 3.9 mEq/L (ref 3.5–5.1)
Sodium: 139 mEq/L (ref 135–145)

## 2011-11-04 LAB — CBC
Hemoglobin: 14.9 g/dL (ref 13.0–17.0)
MCH: 31.4 pg (ref 26.0–34.0)
RBC: 4.75 MIL/uL (ref 4.22–5.81)

## 2011-11-04 LAB — TYPE AND SCREEN: ABO/RH(D): O POS

## 2011-11-04 NOTE — Progress Notes (Signed)
Patient had stress test several years ago but unaware as to where he had it performed. Patient also had a cardiac caths and had two stents placed. Cardiologist is Dr. Anne Fu. PCP is Dr. Cam Hai at [facility]. Patient informed Nurse that he was tested several years ago for sleep apnea but does not wear CPAP as instructed.

## 2011-11-04 NOTE — Progress Notes (Signed)
Nurse called and spoke with Erie Noe at Dr. Lonie Peak office and informed her that the orders were under "signed and held" and Dr. Wynetta Emery would need to sign orders. Erie Noe stated Dr. Wynetta Emery was in the office and she would let him know that he needed to sign the orders.

## 2011-11-04 NOTE — Progress Notes (Signed)
Orders remain under the "signed and held" tab. Patient will sign consent form DOS.

## 2011-11-08 NOTE — Consult Note (Addendum)
Anesthesia Chart Review:  Patient is a 61 year old male posted for one level posterior lumbar fusion with hardware removal on 11/18/11 by Dr. Wynetta Emery.  History includes former smoker, OSA with CPAP use, GERD, CAD s/p RCA (DES 08/2008 and BMS 09/2006), hiatal hernia, hypercholesterolemia, PVD s/p AFBG '03.  PCP is Dr. Cam Hai.  His Cardiologist is Dr. Anne Fu, last visit was on 08/15/11.  By notes, patient was doing well from a CAD standpoint.  He remained on Plavix due to PVD.  His metoprolol was stopped in June 2013 due to dizziness.  Six month follow-up was recommended.   EKG on 11/04/11 showed NSR, poor r wave progression/possible anterior infarct (age undetermined).   His r wave is slightly lower in V3, otherwise I think his EKG is overall stable when compared to one from 11/04/10 Deboraha Sprang).  Cardiac cath on 11/12/10 showed (done after abnormal stress test on 11/04/10 that showed some inferior wall ischemia, EF 42%): 1. Previously placed stent in the right coronary artery distribution, both bare-metal and drug-eluting for in-stent restenosis are widely patent. Other arteries demonstrate only minor luminal irregularities.  2. Normal left ventricular ejection fraction of 60% and there was no apparent inferior wall motion abnormality. There were several successive beats of ectopy during left ventriculogram, which reduces sensitivity.  3. No mitral regurgitation present and no significant aortic valve gradient.  Continued medical therapy was recommended at that time.  Echo on 10/17/06 showed was normal, EF 56%.  There was no active disease on CXR from 11/04/11.  Labs noted.  Patient was seen by his Cardiologist within the past three months, his cardiac cath from 11/12/10 showed patent stents with normal EF.  If no new/acute CV symptoms, then anticipate he can proceed as planned.Shonna Chock, PA-C

## 2011-11-17 MED ORDER — CEFAZOLIN SODIUM-DEXTROSE 2-3 GM-% IV SOLR
2.0000 g | INTRAVENOUS | Status: AC
  Administered 2011-11-18: 2 g via INTRAVENOUS
  Filled 2011-11-17: qty 50

## 2011-11-17 MED ORDER — DEXAMETHASONE SODIUM PHOSPHATE 10 MG/ML IJ SOLN
10.0000 mg | INTRAMUSCULAR | Status: AC
  Administered 2011-11-18: 10 mg via INTRAVENOUS
  Filled 2011-11-17: qty 1

## 2011-11-18 ENCOUNTER — Encounter (HOSPITAL_COMMUNITY): Payer: Self-pay | Admitting: *Deleted

## 2011-11-18 ENCOUNTER — Ambulatory Visit (HOSPITAL_COMMUNITY): Payer: 59 | Admitting: Vascular Surgery

## 2011-11-18 ENCOUNTER — Encounter (HOSPITAL_COMMUNITY): Payer: Self-pay | Admitting: Vascular Surgery

## 2011-11-18 ENCOUNTER — Encounter (HOSPITAL_COMMUNITY)
Admission: RE | Disposition: A | Discharge status: Home / Self Care | Payer: Self-pay | Source: Ambulatory Visit | Attending: Neurosurgery

## 2011-11-18 ENCOUNTER — Ambulatory Visit (HOSPITAL_COMMUNITY): Payer: 59

## 2011-11-18 ENCOUNTER — Inpatient Hospital Stay (HOSPITAL_COMMUNITY)
Admission: RE | Admit: 2011-11-18 | Discharge: 2011-11-21 | DRG: 460 | Disposition: A | Discharge status: Home / Self Care | Payer: 59 | Source: Ambulatory Visit | Attending: Neurosurgery | Admitting: Neurosurgery

## 2011-11-18 DIAGNOSIS — M51379 Other intervertebral disc degeneration, lumbosacral region without mention of lumbar back pain or lower extremity pain: Secondary | ICD-10-CM | POA: Diagnosis present

## 2011-11-18 DIAGNOSIS — K219 Gastro-esophageal reflux disease without esophagitis: Secondary | ICD-10-CM | POA: Diagnosis not present

## 2011-11-18 DIAGNOSIS — Z981 Arthrodesis status: Secondary | ICD-10-CM

## 2011-11-18 DIAGNOSIS — I2510 Atherosclerotic heart disease of native coronary artery without angina pectoris: Secondary | ICD-10-CM | POA: Diagnosis present

## 2011-11-18 DIAGNOSIS — G4730 Sleep apnea, unspecified: Secondary | ICD-10-CM | POA: Diagnosis present

## 2011-11-18 DIAGNOSIS — M5137 Other intervertebral disc degeneration, lumbosacral region: Secondary | ICD-10-CM | POA: Diagnosis present

## 2011-11-18 DIAGNOSIS — M48061 Spinal stenosis, lumbar region without neurogenic claudication: Principal | ICD-10-CM | POA: Diagnosis present

## 2011-11-18 DIAGNOSIS — M549 Dorsalgia, unspecified: Secondary | ICD-10-CM | POA: Diagnosis not present

## 2011-11-18 DIAGNOSIS — I70209 Unspecified atherosclerosis of native arteries of extremities, unspecified extremity: Secondary | ICD-10-CM | POA: Diagnosis present

## 2011-11-18 DIAGNOSIS — G4710 Hypersomnia, unspecified: Secondary | ICD-10-CM | POA: Diagnosis not present

## 2011-11-18 SURGERY — POSTERIOR LUMBAR FUSION 1 WITH HARDWARE REMOVAL
Anesthesia: General | Site: Back | Laterality: Bilateral | Wound class: Clean

## 2011-11-18 MED ORDER — PHENOL 1.4 % MT LIQD: 1.0000 | OROMUCOSAL | Status: DC | PRN

## 2011-11-18 MED ORDER — BACITRACIN 50000 UNITS IM SOLR
INTRAMUSCULAR | Status: AC
  Filled 2011-11-18: qty 1

## 2011-11-18 MED ORDER — CEFAZOLIN SODIUM 1-5 GM-% IV SOLN
1.0000 g | Freq: Three times a day (TID) | INTRAVENOUS | Status: AC
  Administered 2011-11-18 – 2011-11-19 (×2): 1 g via INTRAVENOUS
  Filled 2011-11-18 (×3): qty 50

## 2011-11-18 MED ORDER — OXYCODONE-ACETAMINOPHEN 5-325 MG PO TABS: 1.0000 | ORAL_TABLET | ORAL | Status: DC | PRN

## 2011-11-18 MED ORDER — SODIUM CHLORIDE 0.9 % IR SOLN
Status: DC | PRN
  Administered 2011-11-18: 08:00:00

## 2011-11-18 MED ORDER — PANTOPRAZOLE SODIUM 40 MG PO TBEC
40.0000 mg | DELAYED_RELEASE_TABLET | Freq: Every day | ORAL | Status: DC
  Administered 2011-11-18 – 2011-11-21 (×4): 40 mg via ORAL
  Filled 2011-11-18 (×4): qty 1

## 2011-11-18 MED ORDER — OXYCODONE HCL 5 MG PO TABS
5.0000 mg | ORAL_TABLET | Freq: Once | ORAL | Status: AC | PRN
  Administered 2011-11-18: 5 mg via ORAL

## 2011-11-18 MED ORDER — PROPOFOL 10 MG/ML IV BOLUS
INTRAVENOUS | Status: DC | PRN
  Administered 2011-11-18: 120 mg via INTRAVENOUS

## 2011-11-18 MED ORDER — CYCLOBENZAPRINE HCL 10 MG PO TABS
10.0000 mg | ORAL_TABLET | Freq: Three times a day (TID) | ORAL | Status: DC | PRN
  Administered 2011-11-18 – 2011-11-21 (×4): 10 mg via ORAL
  Filled 2011-11-18 (×4): qty 1

## 2011-11-18 MED ORDER — MIDAZOLAM HCL 5 MG/5ML IJ SOLN
INTRAMUSCULAR | Status: DC | PRN
  Administered 2011-11-18: 2 mg via INTRAVENOUS

## 2011-11-18 MED ORDER — ACETAMINOPHEN 650 MG RE SUPP: 650.0000 mg | RECTAL | Status: DC | PRN

## 2011-11-18 MED ORDER — ONDANSETRON HCL 4 MG/2ML IJ SOLN: 4.0000 mg | Freq: Once | INTRAMUSCULAR | Status: AC | PRN

## 2011-11-18 MED ORDER — CLOPIDOGREL BISULFATE 75 MG PO TABS
75.0000 mg | ORAL_TABLET | Freq: Every day | ORAL | Status: DC
  Administered 2011-11-19 – 2011-11-20 (×2): 75 mg via ORAL
  Filled 2011-11-18 (×3): qty 1

## 2011-11-18 MED ORDER — NEOSTIGMINE METHYLSULFATE 1 MG/ML IJ SOLN
INTRAMUSCULAR | Status: DC | PRN
  Administered 2011-11-18: 5 mg via INTRAVENOUS

## 2011-11-18 MED ORDER — OXYCODONE-ACETAMINOPHEN 5-325 MG PO TABS
1.0000 | ORAL_TABLET | ORAL | Status: DC | PRN
  Administered 2011-11-21: 2 via ORAL
  Filled 2011-11-18: qty 2

## 2011-11-18 MED ORDER — HYDROMORPHONE HCL PF 1 MG/ML IJ SOLN
0.5000 mg | INTRAMUSCULAR | Status: DC | PRN
  Administered 2011-11-19: 1 mg via INTRAVENOUS
  Filled 2011-11-18: qty 1

## 2011-11-18 MED ORDER — TRIAMCINOLONE ACETONIDE 0.147 MG/GM EX AERS
INHALATION_SPRAY | Freq: Three times a day (TID) | CUTANEOUS | Status: DC
  Filled 2011-11-18 (×3): qty 1

## 2011-11-18 MED ORDER — THROMBIN 20000 UNITS EX SOLR
CUTANEOUS | Status: DC | PRN
  Administered 2011-11-18 (×2): via TOPICAL

## 2011-11-18 MED ORDER — LIDOCAINE HCL (CARDIAC) 20 MG/ML IV SOLN
INTRAVENOUS | Status: DC | PRN
  Administered 2011-11-18: 100 mg via INTRAVENOUS

## 2011-11-18 MED ORDER — SODIUM CHLORIDE 0.9 % IJ SOLN
3.0000 mL | Freq: Two times a day (BID) | INTRAMUSCULAR | Status: DC
  Administered 2011-11-18 – 2011-11-21 (×4): 3 mL via INTRAVENOUS

## 2011-11-18 MED ORDER — HYDROCODONE-ACETAMINOPHEN 5-325 MG PO TABS
2.0000 | ORAL_TABLET | ORAL | Status: DC | PRN
  Administered 2011-11-18 – 2011-11-20 (×3): 2 via ORAL
  Filled 2011-11-18 (×4): qty 2

## 2011-11-18 MED ORDER — OXYCODONE HCL 5 MG PO TABS: 5.0000 mg | ORAL_TABLET | Freq: Once | ORAL | Status: AC | PRN

## 2011-11-18 MED ORDER — OXYCODONE HCL 5 MG PO TABS
ORAL_TABLET | ORAL | Status: AC
  Filled 2011-11-18: qty 1

## 2011-11-18 MED ORDER — NIACIN ER (ANTIHYPERLIPIDEMIC) 500 MG PO TBCR
1500.0000 mg | EXTENDED_RELEASE_TABLET | Freq: Every day | ORAL | Status: DC
  Administered 2011-11-18 – 2011-11-20 (×3): 1500 mg via ORAL
  Filled 2011-11-18 (×4): qty 3

## 2011-11-18 MED ORDER — MENTHOL 3 MG MT LOZG
1.0000 | LOZENGE | OROMUCOSAL | Status: DC | PRN
  Filled 2011-11-18: qty 9

## 2011-11-18 MED ORDER — VECURONIUM BROMIDE 10 MG IV SOLR
INTRAVENOUS | Status: DC | PRN
  Administered 2011-11-18: 3 mg via INTRAVENOUS
  Administered 2011-11-18 (×4): 2 mg via INTRAVENOUS

## 2011-11-18 MED ORDER — LACTATED RINGERS IV SOLN
INTRAVENOUS | Status: DC | PRN
  Administered 2011-11-18 (×3): via INTRAVENOUS

## 2011-11-18 MED ORDER — ACETAMINOPHEN 325 MG PO TABS: 650.0000 mg | ORAL_TABLET | ORAL | Status: DC | PRN

## 2011-11-18 MED ORDER — ROCURONIUM BROMIDE 100 MG/10ML IV SOLN
INTRAVENOUS | Status: DC | PRN
  Administered 2011-11-18: 50 mg via INTRAVENOUS

## 2011-11-18 MED ORDER — CYCLOBENZAPRINE HCL 10 MG PO TABS: 10.0000 mg | ORAL_TABLET | Freq: Three times a day (TID) | ORAL | Status: DC | PRN

## 2011-11-18 MED ORDER — ACETAMINOPHEN 10 MG/ML IV SOLN
1000.0000 mg | Freq: Four times a day (QID) | INTRAVENOUS | Status: AC
  Administered 2011-11-18 – 2011-11-19 (×4): 1000 mg via INTRAVENOUS
  Filled 2011-11-18 (×4): qty 100

## 2011-11-18 MED ORDER — GLYCOPYRROLATE 0.2 MG/ML IJ SOLN
INTRAMUSCULAR | Status: DC | PRN
  Administered 2011-11-18: .8 mg via INTRAVENOUS

## 2011-11-18 MED ORDER — FENOFIBRATE 160 MG PO TABS
160.0000 mg | ORAL_TABLET | Freq: Every day | ORAL | Status: DC
  Administered 2011-11-18 – 2011-11-20 (×3): 160 mg via ORAL
  Filled 2011-11-18 (×4): qty 1

## 2011-11-18 MED ORDER — NITROGLYCERIN 0.4 MG SL SUBL: 0.4000 mg | SUBLINGUAL_TABLET | SUBLINGUAL | Status: DC | PRN

## 2011-11-18 MED ORDER — HYDROXYZINE HCL 25 MG PO TABS
25.0000 mg | ORAL_TABLET | ORAL | Status: DC | PRN
  Filled 2011-11-18: qty 1

## 2011-11-18 MED ORDER — MEPERIDINE HCL 25 MG/ML IJ SOLN: 6.2500 mg | INTRAMUSCULAR | Status: DC | PRN

## 2011-11-18 MED ORDER — HEPARIN SODIUM (PORCINE) 1000 UNIT/ML IJ SOLN
INTRAMUSCULAR | Status: AC
  Filled 2011-11-18: qty 1

## 2011-11-18 MED ORDER — OXYCODONE HCL 5 MG/5ML PO SOLN: 5.0000 mg | Freq: Once | ORAL | Status: AC | PRN

## 2011-11-18 MED ORDER — HYDROMORPHONE HCL PF 1 MG/ML IJ SOLN
INTRAMUSCULAR | Status: AC
  Filled 2011-11-18: qty 1

## 2011-11-18 MED ORDER — ACETAMINOPHEN 10 MG/ML IV SOLN
INTRAVENOUS | Status: AC
  Administered 2011-11-18: 1000 mg via INTRAVENOUS
  Filled 2011-11-18: qty 100

## 2011-11-18 MED ORDER — SIMVASTATIN 5 MG PO TABS
5.0000 mg | ORAL_TABLET | Freq: Every day | ORAL | Status: DC
  Administered 2011-11-18 – 2011-11-20 (×3): 5 mg via ORAL
  Filled 2011-11-18 (×4): qty 1

## 2011-11-18 MED ORDER — ONDANSETRON HCL 4 MG/2ML IJ SOLN
INTRAMUSCULAR | Status: DC | PRN
  Administered 2011-11-18: 4 mg via INTRAVENOUS

## 2011-11-18 MED ORDER — ONDANSETRON HCL 4 MG/2ML IJ SOLN: 4.0000 mg | Freq: Once | INTRAMUSCULAR | Status: DC | PRN

## 2011-11-18 MED ORDER — ALBUTEROL SULFATE (5 MG/ML) 0.5% IN NEBU
2.5000 mg | INHALATION_SOLUTION | Freq: Once | RESPIRATORY_TRACT | Status: AC
  Administered 2011-11-18: 2.5 mg via RESPIRATORY_TRACT

## 2011-11-18 MED ORDER — ASPIRIN EC 81 MG PO TBEC
81.0000 mg | DELAYED_RELEASE_TABLET | Freq: Every day | ORAL | Status: DC
  Administered 2011-11-18 – 2011-11-20 (×3): 81 mg via ORAL
  Filled 2011-11-18 (×4): qty 1

## 2011-11-18 MED ORDER — BUPIVACAINE HCL (PF) 0.25 % IJ SOLN
INTRAMUSCULAR | Status: DC | PRN
  Administered 2011-11-18: 10 mL

## 2011-11-18 MED ORDER — ALBUTEROL SULFATE (5 MG/ML) 0.5% IN NEBU
INHALATION_SOLUTION | RESPIRATORY_TRACT | Status: AC
  Filled 2011-11-18: qty 0.5

## 2011-11-18 MED ORDER — LIDOCAINE-EPINEPHRINE 1 %-1:100000 IJ SOLN
INTRAMUSCULAR | Status: DC | PRN
  Administered 2011-11-18: 10 mL

## 2011-11-18 MED ORDER — FENTANYL CITRATE 0.05 MG/ML IJ SOLN
INTRAMUSCULAR | Status: DC | PRN
  Administered 2011-11-18 (×2): 50 ug via INTRAVENOUS
  Administered 2011-11-18: 100 ug via INTRAVENOUS
  Administered 2011-11-18: 50 ug via INTRAVENOUS
  Administered 2011-11-18: 100 ug via INTRAVENOUS
  Administered 2011-11-18: 50 ug via INTRAVENOUS

## 2011-11-18 MED ORDER — SODIUM CHLORIDE 0.9 % IJ SOLN: 3.0000 mL | INTRAMUSCULAR | Status: DC | PRN

## 2011-11-18 MED ORDER — INFLUENZA VIRUS VACC SPLIT PF IM SUSP
0.5000 mL | INTRAMUSCULAR | Status: AC
  Administered 2011-11-19: 0.5 mL via INTRAMUSCULAR
  Filled 2011-11-18: qty 0.5

## 2011-11-18 MED ORDER — SODIUM CHLORIDE 0.9 % IV SOLN
INTRAVENOUS | Status: AC
  Filled 2011-11-18: qty 500

## 2011-11-18 MED ORDER — POTASSIUM CHLORIDE IN NACL 20-0.9 MEQ/L-% IV SOLN
INTRAVENOUS | Status: DC
  Administered 2011-11-18: 999 mL via INTRAVENOUS
  Administered 2011-11-19: 07:00:00 via INTRAVENOUS
  Filled 2011-11-18 (×7): qty 1000

## 2011-11-18 MED ORDER — SODIUM CHLORIDE 0.9 % IV SOLN: 250.0000 mL | INTRAVENOUS | Status: DC

## 2011-11-18 MED ORDER — HYDROMORPHONE HCL PF 1 MG/ML IJ SOLN: 0.2500 mg | INTRAMUSCULAR | Status: DC | PRN

## 2011-11-18 MED ORDER — 0.9 % SODIUM CHLORIDE (POUR BTL) OPTIME
TOPICAL | Status: DC | PRN
  Administered 2011-11-18: 1000 mL

## 2011-11-18 MED ORDER — HYDROMORPHONE HCL PF 1 MG/ML IJ SOLN
0.2500 mg | INTRAMUSCULAR | Status: DC | PRN
  Administered 2011-11-18 (×4): 0.5 mg via INTRAVENOUS

## 2011-11-18 MED ORDER — ONDANSETRON HCL 4 MG/2ML IJ SOLN
4.0000 mg | INTRAMUSCULAR | Status: DC | PRN
  Administered 2011-11-21: 4 mg via INTRAVENOUS
  Filled 2011-11-18: qty 2

## 2011-11-18 SURGICAL SUPPLY — 74 items
ADH SKN CLS APL DERMABOND .7 (GAUZE/BANDAGES/DRESSINGS) ×1
APL SKNCLS STERI-STRIP NONHPOA (GAUZE/BANDAGES/DRESSINGS) ×1
BAG DECANTER FOR FLEXI CONT (MISCELLANEOUS) ×2 IMPLANT
BENZOIN TINCTURE PRP APPL 2/3 (GAUZE/BANDAGES/DRESSINGS) ×2 IMPLANT
BLADE SURG 11 STRL SS (BLADE) ×2 IMPLANT
BLADE SURG ROTATE 9660 (MISCELLANEOUS) ×1 IMPLANT
BRUSH SCRUB EZ PLAIN DRY (MISCELLANEOUS) ×2 IMPLANT
BUR MATCHSTICK NEURO 3.0 LAGG (BURR) ×2 IMPLANT
BUR PRECISION FLUTE 6.0 (BURR) ×2 IMPLANT
CANISTER SUCTION 2500CC (MISCELLANEOUS) ×2 IMPLANT
CLOTH BEACON ORANGE TIMEOUT ST (SAFETY) ×2 IMPLANT
CONT SPEC 4OZ CLIKSEAL STRL BL (MISCELLANEOUS) ×4 IMPLANT
COVER BACK TABLE 24X17X13 BIG (DRAPES) IMPLANT
COVER TABLE BACK 60X90 (DRAPES) ×2 IMPLANT
CROSSLINK DANEK (Orthopedic Implant) ×1 IMPLANT
DECANTER SPIKE VIAL GLASS SM (MISCELLANEOUS) ×2 IMPLANT
DERMABOND ADVANCED (GAUZE/BANDAGES/DRESSINGS) ×1
DERMABOND ADVANCED .7 DNX12 (GAUZE/BANDAGES/DRESSINGS) ×1 IMPLANT
DRAPE C-ARM 42X72 X-RAY (DRAPES) ×4 IMPLANT
DRAPE LAPAROTOMY 100X72X124 (DRAPES) ×2 IMPLANT
DRAPE POUCH INSTRU U-SHP 10X18 (DRAPES) ×2 IMPLANT
DRAPE PROXIMA HALF (DRAPES) IMPLANT
DRAPE SURG 17X23 STRL (DRAPES) ×2 IMPLANT
DRSG OPSITE 4X5.5 SM (GAUZE/BANDAGES/DRESSINGS) ×3 IMPLANT
ELECT REM PT RETURN 9FT ADLT (ELECTROSURGICAL) ×2
ELECTRODE REM PT RTRN 9FT ADLT (ELECTROSURGICAL) ×1 IMPLANT
EVACUATOR 3/16  PVC DRAIN (DRAIN) ×1
EVACUATOR 3/16 PVC DRAIN (DRAIN) ×1 IMPLANT
GAUZE SPONGE 4X4 16PLY XRAY LF (GAUZE/BANDAGES/DRESSINGS) ×2 IMPLANT
GLOVE BIO SURGEON STRL SZ8 (GLOVE) ×4 IMPLANT
GLOVE BIOGEL PI IND STRL 7.0 (GLOVE) IMPLANT
GLOVE BIOGEL PI IND STRL 8 (GLOVE) IMPLANT
GLOVE BIOGEL PI INDICATOR 7.0 (GLOVE) ×1
GLOVE BIOGEL PI INDICATOR 8 (GLOVE) ×2
GLOVE ECLIPSE 7.5 STRL STRAW (GLOVE) ×1 IMPLANT
GLOVE EXAM NITRILE LRG STRL (GLOVE) IMPLANT
GLOVE EXAM NITRILE MD LF STRL (GLOVE) IMPLANT
GLOVE EXAM NITRILE XL STR (GLOVE) IMPLANT
GLOVE EXAM NITRILE XS STR PU (GLOVE) IMPLANT
GLOVE INDICATOR 7.0 STRL GRN (GLOVE) ×1 IMPLANT
GLOVE INDICATOR 8.5 STRL (GLOVE) ×4 IMPLANT
GLOVE SS BIOGEL STRL SZ 6.5 (GLOVE) IMPLANT
GLOVE SUPERSENSE BIOGEL SZ 6.5 (GLOVE) ×2
GLOVE SURG SS PI 7.5 STRL IVOR (GLOVE) ×2 IMPLANT
GOWN BRE IMP SLV AUR LG STRL (GOWN DISPOSABLE) IMPLANT
GOWN BRE IMP SLV AUR XL STRL (GOWN DISPOSABLE) ×5 IMPLANT
GOWN STRL REIN 2XL LVL4 (GOWN DISPOSABLE) IMPLANT
KIT BASIN OR (CUSTOM PROCEDURE TRAY) ×2 IMPLANT
KIT ROOM TURNOVER OR (KITS) ×2 IMPLANT
MILL MEDIUM DISP (BLADE) ×1 IMPLANT
NDL HYPO 25X1 1.5 SAFETY (NEEDLE) ×1 IMPLANT
NEEDLE HYPO 25X1 1.5 SAFETY (NEEDLE) ×2 IMPLANT
NS IRRIG 1000ML POUR BTL (IV SOLUTION) ×2 IMPLANT
PACK LAMINECTOMY NEURO (CUSTOM PROCEDURE TRAY) ×2 IMPLANT
PAD ARMBOARD 7.5X6 YLW CONV (MISCELLANEOUS) ×6 IMPLANT
PUTTY BONE DBX 5CC MIX (Putty) ×1 IMPLANT
ROD PREBENT CDH 5.5X60 (Rod) ×2 IMPLANT
SCREW NON BREAK OFF LGY 55 (Screw) ×4 IMPLANT
SCREW PEDICLE VA L55 6.5X45MM (Screw) ×2 IMPLANT
SPACER CALIBER 10X22MM 11-15MM (Spacer) ×2 IMPLANT
SPONGE GAUZE 4X4 12PLY (GAUZE/BANDAGES/DRESSINGS) ×2 IMPLANT
SPONGE LAP 4X18 X RAY DECT (DISPOSABLE) IMPLANT
SPONGE SURGIFOAM ABS GEL 100 (HEMOSTASIS) ×3 IMPLANT
STRIP CLOSURE SKIN 1/2X4 (GAUZE/BANDAGES/DRESSINGS) ×3 IMPLANT
SUT BONE WAX W31G (SUTURE) ×1 IMPLANT
SUT VIC AB 0 CT1 18XCR BRD8 (SUTURE) ×2 IMPLANT
SUT VIC AB 0 CT1 8-18 (SUTURE) ×2
SUT VIC AB 2-0 CT1 18 (SUTURE) ×2 IMPLANT
SUT VICRYL 4-0 PS2 18IN ABS (SUTURE) ×2 IMPLANT
SYR 20ML ECCENTRIC (SYRINGE) ×2 IMPLANT
TOWEL OR 17X24 6PK STRL BLUE (TOWEL DISPOSABLE) ×2 IMPLANT
TOWEL OR 17X26 10 PK STRL BLUE (TOWEL DISPOSABLE) ×2 IMPLANT
TRAY FOLEY CATH 14FRSI W/METER (CATHETERS) ×2 IMPLANT
WATER STERILE IRR 1000ML POUR (IV SOLUTION) ×2 IMPLANT

## 2011-11-18 NOTE — H&P (Signed)
Ray Brown is an 61 y.o. male.   Chief Complaint: Back and bilateral leg pain HPI: Patient is a very pleasant 61 year old gentleman has undergone previous L3-L5 fusion many years ago he did very well in the last several weeks and months of progressive worsening back and bilateral hip and leg pain radiating to the anterior and Interpore of his thighs consistent with both an L2 and L3 nerve root pattern physical exam was consistent with the decreased in lower extremity reflexes and decreased sensation in L3 nerve root pattern. Imaging findings showed progressive breakdown disc space above his previous fusion with appear to be solid bony fusion on flexion extension films of his lower lumbar levels. Patient's clinical syndrome was progressing with worsening pain he failed conservative treatment with anti-inflammatories exercise treatment was not interested  Proceeding 4 with any injection treatments. So due to his failure conservative treatment progression of clinical syndrome and imaging findings she was recommended decompression stabilization procedure the disc space above his fusion I extensively reviewed the risks and benefits of a posterior lumbar interbody fusion L2-3 as well as perioperative course and expectations of outcome alternatives of surgery he understands and agrees to proceed forward.  Past Medical History  Diagnosis Date  . Sleep apnea     has CPAP but does not use  . Coronary artery disease   . GERD (gastroesophageal reflux disease)   . Pneumonia   . Bronchitis   . H/O hiatal hernia   . Arthritis   . Hypercholesteremia   . Dry skin     in the back of head-left side  . Peripheral vascular disease   . Chronic back pain     Past Surgical History  Procedure Date  . Foot surgery 1980s    right foot  . Back surgery 2004  . Aortic bypass 2003    aortobifemoral bypass  . Cardiac catheterization 2010    x 2 stents Right Iliac artery; first closed so they restented the area    . Coronary angioplasty   . Shoulder surgery     left shoulder rotator cuff repair  . Colonoscopy 08/04/2009    History reviewed. No pertinent family history. Social History:  reports that he has quit smoking. His smoking use included Cigarettes. He quit after 35 years of use. He does not have any smokeless tobacco history on file. He reports that he does not drink alcohol or use illicit drugs.  Allergies:  Allergies  Allergen Reactions  . Ciprofloxacin Rash  . Daypro (Oxaprozin) Rash    Medications Prior to Admission  Medication Sig Dispense Refill  . aspirin EC 81 MG tablet Take 81 mg by mouth at bedtime.      . clopidogrel (PLAVIX) 75 MG tablet Take 75 mg by mouth at bedtime.      . fenofibrate 160 MG tablet Take 160 mg by mouth at bedtime.      Marland Kitchen HYDROcodone-acetaminophen (VICODIN) 5-500 MG per tablet Take 1 tablet by mouth every 6 (six) hours as needed. For pain      . niacin (NIASPAN) 750 MG CR tablet Take 1,500 mg by mouth at bedtime.      . nitroGLYCERIN (NITROSTAT) 0.4 MG SL tablet Place 0.4 mg under the tongue every 5 (five) minutes as needed. For chest pain      . pantoprazole (PROTONIX) 40 MG tablet Take 40 mg by mouth at bedtime.      . pravastatin (PRAVACHOL) 80 MG tablet Take 80 mg by mouth at  bedtime.        No results found for this or any previous visit (from the past 48 hour(s)). No results found.  Review of Systems  Constitutional: Negative.   HENT: Negative.   Eyes: Negative.   Respiratory: Negative.   Cardiovascular: Negative.   Gastrointestinal: Negative.   Genitourinary: Negative.   Musculoskeletal: Positive for back pain.  Skin: Negative.   Neurological: Positive for tingling.    Blood pressure 113/60, pulse 63, temperature 97.5 F (36.4 C), temperature source Oral, resp. rate 18, SpO2 96.00%. Physical Exam  Constitutional: He is oriented to person, place, and time. He appears well-developed.  Eyes: Pupils are equal, round, and reactive to  light.  Neck: Normal range of motion.  Respiratory: Effort normal.  GI: Soft.  Neurological: He is alert and oriented to person, place, and time.  Skin: Skin is warm and dry.     Assessment/Plan 61 year old presents for an L2-3 posterior lumbar interbody fusion  Ray Brown 11/18/2011, 7:09 AM

## 2011-11-18 NOTE — Preoperative (Signed)
Beta Blockers   Reason not to administer Beta Blockers:Not Applicable 

## 2011-11-18 NOTE — Op Note (Signed)
Preoperative diagnosis: Degenerative disc disease lumbar spinal stenosis instability at L2-3 with severe stenosis at L2 and L3  Postoperative diagnosis: Same  Procedure: #1 decompressive laminectomy in  excess of what would  be needed with a standard interbody fusion at L2-3  #2 posterior lumbar interbody fusion L2-3 using a caliper expandable peek cages size 11 mm x 15 lordosis packed with local graft mixed DBX  #3 pedicle screw fixation L2-3 using the 5.5 Medtronic Legacy pedicle to system  #4 exploration of fusion removal of hardware L3-L5  #5 placement of a large Hemovac drain  Surgeon: Jillyn Hidden Torry Istre  Assistant: Shirlean Kelly  Anesthesia: Gen.  EBL less than 500  History of present illness: Patient is a very pleasant 61 year old gentleman is a progress worsening back and bilateral leg pain radiating through both hips and legs and L2 and L3 nerve root pattern. Imaging revealed progressive breakdown L2-3 above the level of his previous C5 fusion was severe stenosis at the L2 and L3 nerve roots the patient takes her treatment imaging findings and progression of clinical syndrome he was recommended a reexploration of fusion removal of hardware L3-L5 and posterior lumbar interbody fusion and decompression at L2-3 I excessively reviewed the risks and benefits of the operation as well as perioperative course expectations about alternatives of surgery he understood and agreed to proceed forward.  Operative procedure: Patient brought into the or was induced under general anesthesia positioned prone the Wilson frame his back was prepped and draped in routine sterile fashion also checked in his lower extremities and felt and noted be strong adequate. Then his old incision was opened up and extended slightly cephalad dissection was carried out and subperiosteal dissections care lamina of L2 and L3 exposing the TPS at L2-L3 and the hardware was exposed and L3 down at L5 fusion was inspected and felt to  be solid the crossing was disconnected the rods were also disconnected the fusion was again reinspected and felt to be solid from L3-L5 city L4 and L5 screws removed. Then the spinous process at L2 was removed central decompression was begun there was marked ligamentous hypertrophy causing hourglass compression of thecal sac at this level for this is all teased off the dura with a 4 Penfield movement piecemeal fashion. The medial facetectomies were performed at L2-3 the L2 and L3 nerve root were markedly stenotic from a marked facet arthropathy this is all unroofed removing these foramina were widely decompressed the aggressive abutting the superior tickling facet of L3 helped in access to the lateral margins disc space after adequate decompression exposure been achieved this taken to pedicle screw placement using a high-speed drill a pilot hole was drilled L2 on the right cannulated awl probed P. Probed again a 6 x 45 screw inserted 2 in the right fluoroscopy U. C7 we confirm Deppen trajectory. Similar fashion 6 x 45 screws inserted on the left and intense taken the interbody work the epidermis isolated and the space was incised and cleanout of the right a size 10 distractor was inserted based on the position this retractor and the interspace this oh was indicative using a size 11:15 degree lordotic graft so than the spaces cleanout the left with 9 rotating cutter downgoing Epstein curettes suture rongeurs. After adequate endplate preparation been achieved the graft was packed with local autograft mixed DBX and inserted then expanded up to approximately 14 mm this had good apposition the endplates opened up the disc space without over distraction. Then the right-sided disc spaces cleanout  central disc was cleaned out a local are graft was packed centrally right-sided cage was inserted and opened up in a similar fashion fluoroscopy confirmed good position of the implants and screws then the was closely irrigated  aggressive decortication care MTPs or lateral gutters the remainder the local Arthrex packed posterior laterally then rods were then placed top tightness tightened at L3 DL prescription 2 screws compressed against L3 all the foraminal reinspected and noted be widely patent then Gelfoam was laid top of the dura across it was applied Hemovac drain was placed and was closed in layers with Vicryl and skin was closed running 4 subcuticular benzo and Steri-Strips were applied patient recovered in stable condition at the M. and counts sponge counts were correct.

## 2011-11-18 NOTE — OR Nursing (Signed)
Hardware removed (four screws and caps, crosslink) were saved for patient. These were sent to processing to be decontaminated

## 2011-11-18 NOTE — Anesthesia Preprocedure Evaluation (Signed)
Anesthesia Evaluation  Patient identified by MRN, date of birth, ID band Patient awake    Reviewed: Allergy & Precautions, H&P , NPO status , Patient's Chart, lab work & pertinent test results  Airway Mallampati: II TM Distance: >3 FB Neck ROM: Full  Mouth opening: Limited Mouth Opening  Dental   Pulmonary          Cardiovascular + CAD and + Cardiac Stents     Neuro/Psych    GI/Hepatic GERD-  Medicated and Controlled,  Endo/Other    Renal/GU      Musculoskeletal   Abdominal   Peds  Hematology   Anesthesia Other Findings   Reproductive/Obstetrics                           Anesthesia Physical Anesthesia Plan  ASA: III  Anesthesia Plan: General   Post-op Pain Management:    Induction: Intravenous  Airway Management Planned: Oral ETT  Additional Equipment:   Intra-op Plan:   Post-operative Plan: Extubation in OR  Informed Consent: I have reviewed the patients History and Physical, chart, labs and discussed the procedure including the risks, benefits and alternatives for the proposed anesthesia with the patient or authorized representative who has indicated his/her understanding and acceptance.     Plan Discussed with: CRNA and Surgeon  Anesthesia Plan Comments:         Anesthesia Quick Evaluation

## 2011-11-18 NOTE — Transfer of Care (Addendum)
Immediate Anesthesia Transfer of Care Note  Patient: Ray Brown  Procedure(s) Performed: Procedure(s) (LRB) with comments: POSTERIOR LUMBAR FUSION 1 WITH HARDWARE REMOVAL (Bilateral) - Lumbar two-three posterior lumbar interbody fusion, exploration and removal of hardware of lumbar three to five  Patient Location: PACU  Anesthesia Type: General  Level of Consciousness: awake, alert  and oriented  Airway & Oxygen Therapy: Pt spontaneously breathing with nasal cannula Oxygen 4 LPM, pt complains of not being able to breathe. O2 sat 100% pt appears stable good chest rise and fall no other weakness. Anesthesiologist notified breathing treatment ordered.  Post-op Assessment: Report given to PACU RN, Post -op Vital signs reviewed and stable, Patient moving all extremities X 4 and Patient able to stick tongue midline  Post vital signs: Reviewed and stable  Complications: No apparent anesthesia complications

## 2011-11-18 NOTE — Anesthesia Procedure Notes (Signed)
Procedure Name: Intubation Date/Time: 11/18/2011 7:41 AM Performed by: Margaree Mackintosh Pre-anesthesia Checklist: Patient identified, Timeout performed, Emergency Drugs available, Suction available and Patient being monitored Patient Re-evaluated:Patient Re-evaluated prior to inductionOxygen Delivery Method: Circle system utilized Preoxygenation: Pre-oxygenation with 100% oxygen Intubation Type: IV induction Ventilation: Mask ventilation without difficulty and Oral airway inserted - appropriate to patient size Laryngoscope Size: Mac and 3 Grade View: Grade II Tube type: Oral Tube size: 7.5 mm Number of attempts: 2 Airway Equipment and Method: Stylet and LTA kit utilized Placement Confirmation: ETT inserted through vocal cords under direct vision,  positive ETCO2 and breath sounds checked- equal and bilateral Secured at: 23 cm Tube secured with: Tape Dental Injury: Teeth and Oropharynx as per pre-operative assessment

## 2011-11-18 NOTE — Care Management Note (Signed)
    Page 1 of 1   11/21/2011     4:45:30 PM   CARE MANAGEMENT NOTE 11/21/2011  Patient:  Ray Brown, Ray Brown   Account Number:  [id number]  Date Initiated:  11/18/2011  Documentation initiated by:  Onnie Boer  Subjective/Objective Assessment:   PT WAS ADMITTED FOR A BACK SURGERY     Action/Plan:   PROGRESSION OF CARE AND DISCHARGE PLANNING   Anticipated DC Date:  11/20/2011   Anticipated DC Plan:  HOME W HOME HEALTH SERVICES      DC Planning Services  CM consult      Choice offered to / List presented to:  C-1 Patient   DME arranged  3-N-1  Levan Hurst      DME agency  [facility]        Status of service:  Completed, signed off Medicare Important Message given?   (If response is "NO", the following Medicare IM given date fields will be blank) Date Medicare IM given:   Date Additional Medicare IM given:    Discharge Disposition:  HOME/SELF CARE  Per UR Regulation:  Reviewed for med. necessity/level of care/duration of stay  If discussed at Long Length of Stay Meetings, dates discussed:    Comments:  11/18/11 Onnie Boer, RN, BSN 1500 PT WAS ADMITTED FOR A LEVEL 1 FUSION.  PTA PT WAS AT HOME WITH SELF CARE.

## 2011-11-18 NOTE — Anesthesia Postprocedure Evaluation (Signed)
Anesthesia Post Note  Patient: Ray Brown  Procedure(s) Performed: Procedure(s) (LRB): POSTERIOR LUMBAR FUSION 1 WITH HARDWARE REMOVAL (Bilateral)  Anesthesia type: general  Patient location: PACU  Post pain: Pain level controlled  Post assessment: Patient's Cardiovascular Status Stable  Last Vitals:  Filed Vitals:   11/18/11 1120  BP:   Pulse:   Temp: 36.1 C  Resp:     Post vital signs: Reviewed and stable  Level of consciousness: sedated  Complications: No apparent anesthesia complications

## 2011-11-19 MED ORDER — OXYCODONE HCL 5 MG PO TABS
5.0000 mg | ORAL_TABLET | ORAL | Status: DC | PRN
Start: 2011-11-19 — End: 2011-11-21
  Administered 2011-11-19 – 2011-11-21 (×5): 10 mg via ORAL
  Filled 2011-11-19 (×6): qty 2

## 2011-11-19 NOTE — Progress Notes (Signed)
Filed Vitals:   11/18/11 2340 11/19/11 0140 11/19/11 0846 11/19/11 0918  BP: 105/55 108/60 104/67 114/61  Pulse: 69 70 77 66  Temp: 98.4 F (36.9 C) 98.6 F (37 C)  97.9 F (36.6 C)  TempSrc: Oral Oral  Oral  Resp: 16 16  18   SpO2: 96% 98%  99%    Patient resting in bed. Having significant pain which is limiting mobility and activity. Dressing clean and dry, Hemovac drain in place, put out 40 cc overnight.  Plan: Have added OxyIR as an alternative analgesic. We'll plan on changing dressing and removing drain in a.m.  Hewitt Shorts, MD 11/19/2011, 10:46 AM

## 2011-11-19 NOTE — Progress Notes (Signed)
Occupational Therapy Evaluation Patient Details Name: Ray Brown MRN: [id number] DOB: 08/18/1950 Today's Date: 11/19/2011 Time: 4401-0272 OT Time Calculation (min): 29 min  OT Assessment / Plan / Recommendation Clinical Impression  Pt s/p PLF L2-3 with hardware removal L5-S1 thus affecting PLOF. Will benefit from acute OT services to address below problem list in prep for safe d/c home.    OT Assessment  Patient needs continued OT Services    Follow Up Recommendations  No OT follow up;Supervision/Assistance - 24 hour    Barriers to Discharge      Equipment Recommendations  3 in 1 bedside comode;Rolling walker with 5" wheels    Recommendations for Other Services    Frequency  Min 2X/week    Precautions / Restrictions Precautions Precautions: Back Precaution Booklet Issued: Yes (comment) Precaution Comments: pt educated on 3/3 back precautions Required Braces or Orthoses: Spinal Brace Spinal Brace: Lumbar corset;Applied in sitting position Restrictions Weight Bearing Restrictions: No   Pertinent Vitals/Pain See vitals    ADL  Upper Body Bathing: Simulated;Moderate assistance Where Assessed - Upper Body Bathing: Supported sitting Lower Body Bathing: Simulated;+1 Total assistance Where Assessed - Lower Body Bathing: Supported sit to stand Upper Body Dressing: Simulated;Moderate assistance Where Assessed - Upper Body Dressing: Supported sitting Lower Body Dressing: Performed;+1 Total assistance Where Assessed - Lower Body Dressing: Supported sit to Pharmacist, hospital: [employer] Method: Sit to Barista:  (bed) Equipment Used: Back brace Transfers/Ambulation Related to ADLs: Min assist for support with sit<>stand.  Pt unable to tolerate ambulation due to pain. ADL Comments: Pt significantly limited by pain during session. Required mod assist to don/doff back brace as pt was bracing himself with bil UE while  sitting EOB.    OT Diagnosis: Acute pain  OT Problem List: Decreased activity tolerance;Decreased knowledge of use of DME or AE;Decreased knowledge of precautions;Pain OT Treatment Interventions: Self-care/ADL training;DME and/or AE instruction;Therapeutic activities;Patient/family education   OT Goals Acute Rehab OT Goals OT Goal Formulation: With patient Time For Goal Achievement: 11/26/11 Potential to Achieve Goals: Good ADL Goals Pt Will Perform Grooming: with modified independence;Standing at sink ADL Goal: Grooming - Progress: Goal set today Pt Will Perform Lower Body Bathing: with modified independence;Sit to stand from chair;Sit to stand from bed;with adaptive equipment ADL Goal: Lower Body Bathing - Progress: Goal set today Pt Will Perform Lower Body Dressing: with modified independence;Sit to stand from chair;Sit to stand from bed;with adaptive equipment ADL Goal: Lower Body Dressing - Progress: Goal set today Pt Will Transfer to Toilet: with modified independence;Ambulation;with DME;Comfort height toilet;Maintaining back safety precautions ADL Goal: Toilet Transfer - Progress: Goal set today Miscellaneous OT Goals Miscellaneous OT Goal #1: Pt will independently don/doff back brace sitting EOB in prep for functional mobility. OT Goal: Miscellaneous Goal #1 - Progress: Goal set today Miscellaneous OT Goal #2: Pt will perform bed mobility with mod I in prep for EOB ADLs. OT Goal: Miscellaneous Goal #2 - Progress: Goal set today  Visit Information  Last OT Received On: 11/19/11 Assistance Needed: +1    Subjective Data      Prior Functioning     Home Living Lives With: Spouse Available Help at Discharge: Family;Available 24 hours/day Type of Home: House Home Access: Stairs to enter [employer] of Steps: 6 Entrance Stairs-Rails: Right;Left Home Layout: One level Bathroom Shower/Tub: Walk-in shower;Door [employer] Toilet: Standard Bathroom Accessibility:  Yes How Accessible: Accessible via walker Home Adaptive Equipment: Straight cane Prior Function Level of Independence:  Independent Able to Take Stairs?: Yes Driving: Yes Vocation: Retired Musician: No difficulties Dominant Hand: Right         Vision/Perception     Cognition  Overall Cognitive Status: Appears within functional limits for tasks assessed/performed Arousal/Alertness: Awake/alert Orientation Level: Appears intact for tasks assessed Behavior During Session: [facility] for tasks performed    Extremity/Trunk Assessment Right Upper Extremity Assessment RUE ROM/Strength/Tone: Within functional levels Left Upper Extremity Assessment LUE ROM/Strength/Tone: Within functional levels     Mobility Bed Mobility Bed Mobility: Rolling Right;Right Sidelying to Sit;Sit to Sidelying Right;Scooting to [facility] Rolling Right: 4: Min assist Right Sidelying to Sit: 4: Min assist Sit to Sidelying Right: 4: Min assist Scooting to [facility]: 3: Mod assist Details for Bed Mobility Assistance: VC for proper sequencing and log rolling to maintain back precautions. Mod assist to get to [facility] as pt in a lot of pain after activity.  Transfers Transfers: Sit to Stand;Stand to Sit Sit to Stand: 4: Min assist;With upper extremity assist;From bed Stand to Sit: 4: Min assist;With upper extremity assist;To bed Details for Transfer Assistance: Min assist for stability as pt with increased pain with all mobility. Cues for hand placement for a safe stand to/from RW     Shoulder Instructions     Exercise     Balance Static Standing Balance Static Standing - Balance Support: Bilateral upper extremity supported Static Standing - Level of Assistance: 5: Stand by assistance Static Standing - Comment/# of Minutes: Pt stood at [facility] for ~3 minutes without any physical assist. Pt requested to sit secondary rto pain   End of Session OT - End of Session Equipment Utilized During Treatment: Gait  belt;Back brace Activity Tolerance: Patient limited by pain Patient left: in bed;with call bell/phone within reach;with family/visitor present Nurse Communication: Mobility status  GO   11/19/2011 Cipriano Mile OTR/L Pager [phone] Office [phone]   Cipriano Mile 11/19/2011, 3:02 PM

## 2011-11-19 NOTE — Progress Notes (Signed)
Patient ambulated in hall a short distance with severe pain despite pre-medicatting, however did much better physically than his early attempt with PT. Patient located back to bed, reposition, more comfortable when not moving. Will cont to monitor and ambulate.  Minor, Yvette Rack

## 2011-11-19 NOTE — Evaluation (Signed)
Physical Therapy Evaluation Patient Details Name: Ray Brown MRN: [id number] DOB: January 18, 1951 Today's Date: 11/19/2011 Time: 8295-6213 PT Time Calculation (min): 32 min  PT Assessment / Plan / Recommendation Clinical Impression  Pt s/p PLF L2-3 with hardware removal L5-S1. Evaluation limited secondary to pain in 10/10 pain with all mobility. Pt will benefit from skilled PT in the acute care setting in order to maximize functional mobility and return to PLOF for a safe d/c home    PT Assessment  Patient needs continued PT services    Follow Up Recommendations  No PT follow up;Supervision for mobility/OOB    Barriers to Discharge        Equipment Recommendations  Rolling walker with 5" wheels    Recommendations for Other Services     Frequency Min 5X/week    Precautions / Restrictions Precautions Precautions: Back Precaution Booklet Issued: Yes (comment) Precaution Comments: pt educated on 3/3 back precautions Required Braces or Orthoses: Spinal Brace Spinal Brace: Lumbar corset;Applied in sitting position Restrictions Weight Bearing Restrictions: No   Pertinent Vitals/Pain 10/10 pain during all mobility       Mobility  Bed Mobility Bed Mobility: Rolling Right;Right Sidelying to Sit;Sit to Sidelying Right;Scooting to [facility] Rolling Right: 4: Min assist Right Sidelying to Sit: 4: Min assist Sit to Sidelying Right: 4: Min assist Scooting to [facility]: 3: Mod assist Details for Bed Mobility Assistance: VC for proper sequencing and log rolling to maintain back precautions. Mod assist to get to [facility] as pt in a lot of pain after activity.  Transfers Transfers: Sit to Stand;Stand to Sit Sit to Stand: 4: Min assist;With upper extremity assist;From bed Stand to Sit: 4: Min assist;With upper extremity assist;To bed Details for Transfer Assistance: Min assist for stability as pt with increased pain with all mobility. Cues for hand placement for a safe stand to/from  RW Ambulation/Gait Ambulation/Gait Assistance: Not tested (comment) (pt in 10/10 pain with standing)           PT Diagnosis: Acute pain;Difficulty walking  PT Problem List: Decreased activity tolerance;Decreased mobility;Decreased knowledge of use of DME;Decreased safety awareness;Decreased knowledge of precautions;Pain PT Treatment Interventions: DME instruction;Gait training;Stair training;Functional mobility training;Therapeutic activities;Patient/family education   PT Goals Acute Rehab PT Goals PT Goal Formulation: With patient Time For Goal Achievement: 11/26/11 Potential to Achieve Goals: Good Pt will Roll Supine to Right Side: with modified independence PT Goal: Rolling Supine to Right Side - Progress: Goal set today Pt will Roll Supine to Left Side: with modified independence PT Goal: Rolling Supine to Left Side - Progress: Goal set today Pt will go Supine/Side to Sit: with modified independence PT Goal: Supine/Side to Sit - Progress: Goal set today Pt will go Sit to Supine/Side: with modified independence PT Goal: Sit to Supine/Side - Progress: Goal set today Pt will go Sit to Stand: with modified independence PT Goal: Sit to Stand - Progress: Goal set today Pt will go Stand to Sit: with modified independence PT Goal: Stand to Sit - Progress: Goal set today Pt will Transfer Bed to Chair/Chair to Bed: with modified independence PT Transfer Goal: Bed to Chair/Chair to Bed - Progress: Goal set today Pt will Ambulate: >150 feet;with modified independence;with least restrictive assistive device PT Goal: Ambulate - Progress: Goal set today Pt will Go Up / Down Stairs: 3-5 stairs;with rail(s);with supervision PT Goal: Up/Down Stairs - Progress: Goal set today  Visit Information  Last PT Received On: 11/19/11 Assistance Needed: +1 PT/OT Co-Evaluation/Treatment: Yes  Subjective Data  Patient Stated Goal: to go home soon   Prior Functioning  Home Living Lives With:  Spouse Available Help at Discharge: Family;Available 24 hours/day Type of Home: House Home Access: Stairs to enter [employer] of Steps: 6 Entrance Stairs-Rails: Right;Left Home Layout: One level Bathroom Shower/Tub: Walk-in shower;Door [employer] Toilet: Standard Bathroom Accessibility: Yes How Accessible: Accessible via walker Home Adaptive Equipment: Straight cane Prior Function Level of Independence: Independent Able to Take Stairs?: Yes Driving: Yes Vocation: Retired Comments: woodworking, very active Musician: No difficulties Dominant Hand: Right    Cognition  Overall Cognitive Status: Appears within functional limits for tasks assessed/performed Arousal/Alertness: Awake/alert Orientation Level: Appears intact for tasks assessed Behavior During Session: [facility] for tasks performed    Extremity/Trunk Assessment Right Lower Extremity Assessment RLE ROM/Strength/Tone: Within functional levels RLE Sensation: WFL - Light Touch Left Lower Extremity Assessment LLE ROM/Strength/Tone: Within functional levels LLE Sensation: WFL - Light Touch   Balance Balance Balance Assessed: Yes Static Standing Balance Static Standing - Balance Support: Bilateral upper extremity supported Static Standing - Level of Assistance: 5: Stand by assistance Static Standing - Comment/# of Minutes: Pt stood at [facility] for ~3 minutes without any physical assist. Pt requested to sit secondary rto pain  End of Session PT - End of Session Equipment Utilized During Treatment: Back brace;Gait belt Activity Tolerance: Patient limited by pain Patient left: in bed;with call bell/phone within reach Nurse Communication: Mobility status;Patient requests pain meds    Milana Kidney 11/19/2011, 1:00 PM  11/19/2011 Milana Kidney DPT PAGER: [phone] OFFICE: [phone]

## 2011-11-20 NOTE — Progress Notes (Signed)
Patient ambulated down half the unit this morning, with severe pain despite pre-medication. However patient walked a much further distance than was previously able. Pt saying that his right hip pain is all gone, and that the main pain now is just centered in his back.  Patient walked back to chair, more comfortable once sitting. Call bell in reach.  Minor, Yvette Rack

## 2011-11-20 NOTE — Progress Notes (Signed)
Filed Vitals:   11/19/11 1818 11/19/11 2151 11/20/11 0109 11/20/11 0513  BP: 108/51 97/43 110/53 96/52  Pulse: 71 73 73 77  Temp: 98.1 F (36.7 C) 98.4 F (36.9 C) 98.2 F (36.8 C) 98.1 F (36.7 C)  TempSrc: Oral Oral Oral Oral  Resp: 18 16 16 18   Height:   5\' 8"  (1.727 m)   Weight:   87.998 kg (194 lb)   SpO2: 96% 97% 97% 96%    Patient sitting up in chair, has already ambulate in the halls. However having significant pain. Asking for saline lock to be removed. Hemovac drain put out 140 cc over last night, will leave in place for now, and continue to monitor.  Plan: Continue to progress with PT and OT as tolerated.  Hewitt Shorts, MD 11/20/2011, 9:17 AM

## 2011-11-20 NOTE — Progress Notes (Signed)
Physical Therapy Treatment Patient Details Name: Ray Brown MRN: [id number] DOB: [birth year] Today's Date: 11/20/2011 Time: 4098-1191 PT Time Calculation (min): 30 min  PT Assessment / Plan / Recommendation Comments on Treatment Session  Pt progressing well, although still limited by pain. Cueing throughout for safety with back precautions during all mobility. Continue per plan, attempt stairs next session prior to d/c home.    Follow Up Recommendations  No PT follow up;Supervision for mobility/OOB    Barriers to Discharge        Equipment Recommendations  3 in 1 bedside comode;Rolling walker with 5" wheels    Recommendations for Other Services    Frequency Min 5X/week   Plan Discharge plan remains appropriate;Frequency remains appropriate    Precautions / Restrictions Precautions Precautions: Back Precaution Comments: pt able to verbalize 3/3 back precautions. Min cueing for safety to maintain throughout treatment Required Braces or Orthoses: Spinal Brace Spinal Brace: Lumbar corset;Applied in sitting position Restrictions Weight Bearing Restrictions: No   Pertinent Vitals/Pain Pt with 10/10 pain during sit/stand. Improved with repitition.     Mobility  Bed Mobility Bed Mobility: Rolling Right;Right Sidelying to Sit;Sit to Sidelying Right;Scooting to [facility] Rolling Right: 4: Min guard Right Sidelying to Sit: 4: Min guard Sit to Sidelying Right: 4: Min guard Scooting to HOB: 4: Min guard Details for Bed Mobility Assistance: VC for proper sequencing to avoid twisting in/out of bed by performing log roll. No assistance of LEs needed this session.  Transfers Transfers: Sit to Stand;Stand to Sit Sit to Stand: 4: Min assist;With upper extremity assist;From bed Stand to Sit: 4: Min assist;With upper extremity assist;To bed Details for Transfer Assistance: Pt required multiple attempts to stand using UEs on bed, unsuccesful. Pt able to stand with 1 hand on RW, pt instructed on  reasons for UE on bed and safety. VC to maintain back precautions during standing as well as pt with minimal twisting and bending back to get into standing, encouraged pt to use LEs for strength into standing.  Ambulation/Gait Ambulation/Gait Assistance: 4: Min guard Ambulation Distance (Feet): 400 Feet Assistive device: Rolling walker Ambulation/Gait Assistance Details: VC for posture throughout session. Pt with improvements in posture, although still with flexion with increased fatigue.  Gait Pattern: Step-to pattern;Decreased stride length;Decreased hip/knee flexion - right;Decreased hip/knee flexion - left;Trunk flexed Gait velocity: decreased gait speed    Exercises     PT Diagnosis:    PT Problem List:   PT Treatment Interventions:     PT Goals Acute Rehab PT Goals PT Goal: Rolling Supine to Right Side - Progress: Progressing toward goal PT Goal: Rolling Supine to Left Side - Progress: Progressing toward goal PT Goal: Supine/Side to Sit - Progress: Progressing toward goal PT Goal: Sit to Supine/Side - Progress: Progressing toward goal PT Goal: Sit to Stand - Progress: Progressing toward goal PT Goal: Stand to Sit - Progress: Progressing toward goal PT Transfer Goal: Bed to Chair/Chair to Bed - Progress: Progressing toward goal PT Goal: Ambulate - Progress: Progressing toward goal  Visit Information  Last PT Received On: 11/20/11 Assistance Needed: +1    Subjective Data      Cognition  Overall Cognitive Status: Appears within functional limits for tasks assessed/performed Arousal/Alertness: Awake/alert Orientation Level: Appears intact for tasks assessed Behavior During Session: [facility] for tasks performed    Balance  Static Standing Balance Static Standing - Balance Support: Bilateral upper extremity supported Static Standing - Level of Assistance: 6: Modified independent (Device/Increase time) Static Standing -  Comment/# of Minutes: Pt stood at RW while using bathroom  with UEs both on and off RW. No difficulties  End of Session PT - End of Session Equipment Utilized During Treatment: Back brace;Gait belt Activity Tolerance: Patient limited by pain Patient left: in bed;with call bell/phone within reach Nurse Communication: Mobility status;Patient requests pain meds   GP     Milana Kidney 11/20/2011, 4:22 PM

## 2011-11-21 NOTE — Progress Notes (Signed)
Physical Therapy Treatment Patient Details Name: Ray Brown MRN: [id number] DOB: [birth year] Today's Date: 11/21/2011 Time: 4098-1191 PT Time Calculation (min): 25 min  PT Assessment / Plan / Recommendation Comments on Treatment Session  Pt continues to have significant pain, yet is able to mobilize safely. Pt and wife agree he is ready for d/c home from a mobility standpoint, yet they are concerned re: pain control and drainage from wound.    Follow Up Recommendations  No PT follow up;Supervision for mobility/OOB    Barriers to Discharge        Equipment Recommendations  3 in 1 bedside comode;Rolling walker with 5" wheels    Recommendations for Other Services    Frequency Min 5X/week   Plan Discharge plan remains appropriate;Frequency remains appropriate    Precautions / Restrictions Precautions Precautions: Back Precaution Comments: able to state 2/3 precautions initially; 3/3 by end of session; cuing x 2 to not twist; multiple cues for no flexion with use of RW Required Braces or Orthoses: Spinal Brace Spinal Brace: Lumbar corset;Applied in sitting position   Pertinent Vitals/Pain 8/10 back pain on arrival; RN notified and in to give pain meds by end of session.    Mobility  Bed Mobility Bed Mobility: Not assessed Transfers Transfers: Sit to Stand;Stand to Sit Sit to Stand: 4: Min guard;With upper extremity assist;With armrests;From chair/3-in-1 Stand to Sit: 4: Min guard;With upper extremity assist;With armrests;To chair/3-in-1 Details for Transfer Assistance: Pt prefers one hand on RW as he ascends/descends; pt demonstrated the ability to push straight down on RW and does not cause it to tip sideways; discussed again possible risks and pt/wife verbalized understanding Ambulation/Gait Ambulation/Gait Assistance: 5: Supervision Ambulation Distance (Feet): 300 Feet Assistive device: Rolling walker Ambulation/Gait Assistance Details: RW intially too tall and causing pt  to push it too far ahead and flexing his spine; adjusted and then continued to require cues for upright posture and keeping RW closer to him Gait Pattern: Step-through pattern;Decreased stride length;Trunk flexed Gait velocity: decreased gait speed Stairs: Yes Stairs Assistance: 4: Min guard Stair Management Technique: One rail Right;One rail Left;Step to pattern;Sideways;Forwards (pt tried forwards, sideways with Rt rail, sideways with Lt) Number of Stairs: 6     Exercises     PT Diagnosis:    PT Problem List:   PT Treatment Interventions:     PT Goals Acute Rehab PT Goals Pt will go Sit to Stand: with modified independence PT Goal: Sit to Stand - Progress: Progressing toward goal Pt will go Stand to Sit: with modified independence PT Goal: Stand to Sit - Progress: Progressing toward goal Pt will Ambulate: >150 feet;with modified independence;with least restrictive assistive device PT Goal: Ambulate - Progress: Progressing toward goal Pt will Go Up / Down Stairs: 3-5 stairs;with rail(s);with supervision PT Goal: Up/Down Stairs - Progress: Progressing toward goal  Visit Information  Last PT Received On: 11/21/11 Assistance Needed: +1    Subjective Data  Subjective: Pt reports he hurts more when he lies down than when he is sitting Patient Stated Goal: to go home soon   Cognition  Overall Cognitive Status: Appears within functional limits for tasks assessed/performed Arousal/Alertness: Awake/alert Orientation Level: Appears intact for tasks assessed Behavior During Session: [facility] for tasks performed    Balance     End of Session PT - End of Session Equipment Utilized During Treatment: Back brace Activity Tolerance: Patient tolerated treatment well Patient left: in chair;with call bell/phone within reach;with family/visitor present;with nursing in room Nurse Communication: Mobility  status;Patient requests pain meds;Other (comment) (OK for d/c from PT standpoint)   GP      Yola Paradiso 11/21/2011, 9:09 AM Pager [phone]

## 2011-11-21 NOTE — Progress Notes (Signed)
Occupational Therapy Treatment Patient Details Name: Ray Brown MRN: [id number] DOB: 06-Jun-1950 Today's Date: 11/21/2011 Time: 4098-1191 OT Time Calculation (min): 24 min   Equipment Recommendations  3 in 1 bedside comode;Rolling walker with 5" wheels          Plan Discharge plan remains appropriate    Precautions / Restrictions Precautions Precautions: Back Spinal Brace: Lumbar corset Restrictions Other Position/Activity Restrictions: able to recall 3 of 3 back precautions in context of ADL activity       ADL  Lower Body Dressing: Performed;Min guard Where Assessed - Lower Body Dressing: Unsupported sit to stand;Other (comment) (with AE) Transfers/Ambulation Related to ADLs: Pt practiced with AE including reacher, sock aid and shoe horn. Instructed pt where he could obtain.  Pts wife present for education       OT Goals ADL Goals ADL Goal: Lower Body Dressing - Progress: Progressing toward goals  Visit Information  Last OT Received On: 11/21/11    Subjective Data  Subjective: that sock aid is pretty handy      Cognition  Overall Cognitive Status: Appears within functional limits for tasks assessed/performed Arousal/Alertness: Awake/alert Orientation Level: Appears intact for tasks assessed Behavior During Session: [facility] for tasks performed             End of Session OT - End of Session Activity Tolerance: Patient tolerated treatment well Patient left: in chair  GO     Larine Fielding, [place] Kung 11/21/2011, 1:26 PM

## 2011-11-21 NOTE — Discharge Summary (Signed)
D/c instruction and script and home equipment delivered and given to pt and wife. Pt verbalized understanding by verbalization and demonstrationing  DC to the main lobby via W/C by Nursing staff and accompanied by wife in  Stable condition back incision  Remained intact.   Follow up/ Incision care instructions ,back precaation reenforced

## 2011-11-21 NOTE — Progress Notes (Signed)
Subjective: Patient reports She's feeling great no leg pain to some back soreness but well-controlled on pills.  Objective: Vital signs in last 24 hours: Temp:  [98.2 F (36.8 C)-99.7 F (37.6 C)] 99.7 F (37.6 C) (09/30 0938) Pulse Rate:  [74-85] 81  (09/30 0938) Resp:  [18] 18  (09/30 0938) BP: (102-116)/(52-62) 109/56 mmHg (09/30 0938) SpO2:  [95 %-97 %] 97 % (09/30 0938)  Intake/Output from previous day: 09/29 0701 - 09/30 0700 In: 120 [P.O.:120] Out: 125 [Drains:125] Intake/Output this shift:    Strength out of 5 wound clean and dry  Lab Results: No results found for this basename: WBC:2,HGB:2,HCT:2,PLT:2 in the last 72 hours BMET No results found for this basename: NA:2,K:2,CL:2,CO2:2,GLUCOSE:2,BUN:2,CREATININE:2,CALCIUM:2 in the last 72 hours  Studies/Results: No results found.  Assessment/Plan: Postop day 3 from a plate doing great discharge him  LOS: 3 days     Ray Brown P 11/21/2011, 12:12 PM

## 2011-11-21 NOTE — Discharge Summary (Signed)
  Physician Discharge Summary  Patient ID: Ray Brown MRN: [id number] DOB/AGE: November 17, 1950 61 y.o.  Admit date: 11/18/2011 Discharge date: 11/21/2011  Admission Diagnoses: Degenerative disease lumbar spinal stenosis L2-3  Discharge Diagnoses: Same Active Problems:  * No active hospital problems. *    Discharged Condition: good  Hospital Course: Patient was brought into the hospital underwent a L2-3 decompressive laminectomy and fusion exploration of fusion we will hardware L3-L5 postoperative patient did very well with recovered in the floor on the floor patient was convalescing well ambulating and voiding spontaneously tolerating regular diet and was he'll be discharged home scheduled followup approximately 1-2 weeks time of discharge she was neurologically intact.  Consults: Significant Diagnostic Studies: Treatments: Posterior lumbar interbody fusion L2-3 Discharge Exam: Blood pressure 109/56, pulse 81, temperature 99.7 F (37.6 C), temperature source Oral, resp. rate 18, height 5\' 8"  (1.727 m), weight 87.998 kg (194 lb), SpO2 97.00%. Strength out of 5 wound clean and dry  Disposition: Home     Medication List     As of 11/21/2011 12:13 PM    TAKE these medications         aspirin EC 81 MG tablet   Take 81 mg by mouth at bedtime.      clopidogrel 75 MG tablet   Commonly known as: PLAVIX   Take 75 mg by mouth at bedtime.      cyclobenzaprine 10 MG tablet   Commonly known as: FLEXERIL   Take 1 tablet (10 mg total) by mouth 3 (three) times daily as needed for muscle spasms.      fenofibrate 160 MG tablet   Take 160 mg by mouth at bedtime.      HYDROcodone-acetaminophen 5-500 MG per tablet   Commonly known as: VICODIN   Take 1 tablet by mouth every 6 (six) hours as needed. For pain      niacin 750 MG CR tablet   Commonly known as: NIASPAN   Take 1,500 mg by mouth at bedtime.      nitroGLYCERIN 0.4 MG SL tablet   Commonly known as: NITROSTAT   Place 0.4 mg  under the tongue every 5 (five) minutes as needed. For chest pain      oxyCODONE-acetaminophen 5-325 MG per tablet   Commonly known as: PERCOCET/ROXICET   Take 1-2 tablets by mouth every 4 (four) hours as needed.      pantoprazole 40 MG tablet   Commonly known as: PROTONIX   Take 40 mg by mouth at bedtime.      pravastatin 80 MG tablet   Commonly known as: PRAVACHOL   Take 80 mg by mouth at bedtime.         Signed: Cassandra Harbold P 11/21/2011, 12:13 PM

## 2011-11-23 MED FILL — Sodium Chloride Irrigation Soln 0.9%: Qty: 3000 | Status: AC

## 2011-11-23 MED FILL — Heparin Sodium (Porcine) Inj 1000 Unit/ML: INTRAMUSCULAR | Qty: 30 | Status: AC

## 2011-11-23 MED FILL — Sodium Chloride IV Soln 0.9%: INTRAVENOUS | Qty: 1000 | Status: AC

## 2012-02-06 DIAGNOSIS — G4733 Obstructive sleep apnea (adult) (pediatric): Secondary | ICD-10-CM | POA: Diagnosis not present

## 2012-02-06 DIAGNOSIS — I70209 Unspecified atherosclerosis of native arteries of extremities, unspecified extremity: Secondary | ICD-10-CM | POA: Diagnosis not present

## 2012-02-06 DIAGNOSIS — I10 Essential (primary) hypertension: Secondary | ICD-10-CM | POA: Diagnosis not present

## 2012-02-06 DIAGNOSIS — E782 Mixed hyperlipidemia: Secondary | ICD-10-CM | POA: Diagnosis not present

## 2012-02-16 ENCOUNTER — Other Ambulatory Visit: Payer: Self-pay | Admitting: Family Medicine

## 2012-02-16 DIAGNOSIS — L723 Sebaceous cyst: Secondary | ICD-10-CM | POA: Diagnosis not present

## 2012-02-16 DIAGNOSIS — L565 Disseminated superficial actinic porokeratosis (DSAP): Secondary | ICD-10-CM | POA: Diagnosis not present

## 2012-02-16 DIAGNOSIS — D485 Neoplasm of uncertain behavior of skin: Secondary | ICD-10-CM | POA: Diagnosis not present

## 2012-02-28 DIAGNOSIS — IMO0002 Reserved for concepts with insufficient information to code with codable children: Secondary | ICD-10-CM | POA: Diagnosis not present

## 2012-02-28 DIAGNOSIS — M5106 Intervertebral disc disorders with myelopathy, lumbar region: Secondary | ICD-10-CM | POA: Diagnosis not present

## 2012-02-28 DIAGNOSIS — M48061 Spinal stenosis, lumbar region without neurogenic claudication: Secondary | ICD-10-CM | POA: Diagnosis not present

## 2012-03-05 ENCOUNTER — Other Ambulatory Visit: Payer: Self-pay | Admitting: Neurosurgery

## 2012-03-05 DIAGNOSIS — I7389 Other specified peripheral vascular diseases: Secondary | ICD-10-CM

## 2012-03-05 DIAGNOSIS — IMO0002 Reserved for concepts with insufficient information to code with codable children: Secondary | ICD-10-CM

## 2012-03-05 DIAGNOSIS — M5106 Intervertebral disc disorders with myelopathy, lumbar region: Secondary | ICD-10-CM

## 2012-03-05 DIAGNOSIS — M48061 Spinal stenosis, lumbar region without neurogenic claudication: Secondary | ICD-10-CM

## 2012-03-05 DIAGNOSIS — M25519 Pain in unspecified shoulder: Secondary | ICD-10-CM

## 2012-03-12 ENCOUNTER — Ambulatory Visit
Admission: RE | Admit: 2012-03-12 | Discharge: 2012-03-12 | Disposition: A | Discharge status: Home / Self Care | Payer: 59 | Source: Ambulatory Visit | Attending: Neurosurgery | Admitting: Neurosurgery

## 2012-03-12 DIAGNOSIS — M5106 Intervertebral disc disorders with myelopathy, lumbar region: Secondary | ICD-10-CM

## 2012-03-12 DIAGNOSIS — IMO0002 Reserved for concepts with insufficient information to code with codable children: Secondary | ICD-10-CM

## 2012-03-12 DIAGNOSIS — I7389 Other specified peripheral vascular diseases: Secondary | ICD-10-CM

## 2012-03-12 DIAGNOSIS — M25519 Pain in unspecified shoulder: Secondary | ICD-10-CM

## 2012-03-12 DIAGNOSIS — M48061 Spinal stenosis, lumbar region without neurogenic claudication: Secondary | ICD-10-CM

## 2012-03-12 MED ORDER — IOHEXOL 350 MG/ML SOLN
80.0000 mL | Freq: Once | INTRAVENOUS | Status: AC | PRN
  Administered 2012-03-12: 80 mL via INTRAVENOUS

## 2012-04-10 DIAGNOSIS — N401 Enlarged prostate with lower urinary tract symptoms: Secondary | ICD-10-CM | POA: Diagnosis not present

## 2012-05-31 DIAGNOSIS — M47817 Spondylosis without myelopathy or radiculopathy, lumbosacral region: Secondary | ICD-10-CM | POA: Diagnosis not present

## 2012-05-31 DIAGNOSIS — M5137 Other intervertebral disc degeneration, lumbosacral region: Secondary | ICD-10-CM | POA: Diagnosis not present

## 2012-05-31 DIAGNOSIS — M545 Low back pain: Secondary | ICD-10-CM | POA: Diagnosis not present

## 2012-05-31 DIAGNOSIS — M4804 Spinal stenosis, thoracic region: Secondary | ICD-10-CM | POA: Diagnosis not present

## 2012-06-11 DIAGNOSIS — M25559 Pain in unspecified hip: Secondary | ICD-10-CM | POA: Diagnosis not present

## 2012-07-03 DIAGNOSIS — M25559 Pain in unspecified hip: Secondary | ICD-10-CM | POA: Diagnosis not present

## 2012-07-20 DIAGNOSIS — M25559 Pain in unspecified hip: Secondary | ICD-10-CM | POA: Diagnosis not present

## 2012-08-13 DIAGNOSIS — E782 Mixed hyperlipidemia: Secondary | ICD-10-CM | POA: Diagnosis not present

## 2012-08-13 DIAGNOSIS — I2510 Atherosclerotic heart disease of native coronary artery without angina pectoris: Secondary | ICD-10-CM | POA: Diagnosis not present

## 2012-08-13 DIAGNOSIS — I10 Essential (primary) hypertension: Secondary | ICD-10-CM | POA: Diagnosis not present

## 2012-08-27 DIAGNOSIS — L84 Corns and callosities: Secondary | ICD-10-CM | POA: Diagnosis not present

## 2012-08-27 DIAGNOSIS — T148XXA Other injury of unspecified body region, initial encounter: Secondary | ICD-10-CM | POA: Diagnosis not present

## 2012-09-28 DIAGNOSIS — L84 Corns and callosities: Secondary | ICD-10-CM | POA: Diagnosis not present

## 2012-09-28 DIAGNOSIS — J40 Bronchitis, not specified as acute or chronic: Secondary | ICD-10-CM | POA: Diagnosis not present

## 2012-10-02 DIAGNOSIS — M79609 Pain in unspecified limb: Secondary | ICD-10-CM | POA: Diagnosis not present

## 2012-10-08 DIAGNOSIS — M2010 Hallux valgus (acquired), unspecified foot: Secondary | ICD-10-CM | POA: Diagnosis not present

## 2012-11-17 ENCOUNTER — Other Ambulatory Visit: Payer: Self-pay | Admitting: Cardiology

## 2012-11-17 DIAGNOSIS — Z79899 Other long term (current) drug therapy: Secondary | ICD-10-CM

## 2012-11-17 DIAGNOSIS — E782 Mixed hyperlipidemia: Secondary | ICD-10-CM

## 2012-12-05 ENCOUNTER — Telehealth: Payer: Self-pay

## 2012-12-06 MED ORDER — CLOPIDOGREL BISULFATE 75 MG PO TABS: 75.0000 mg | ORAL_TABLET | Freq: Every day | ORAL | Status: DC

## 2012-12-06 NOTE — Telephone Encounter (Signed)
Refill rx

## 2012-12-13 ENCOUNTER — Telehealth: Payer: Self-pay | Admitting: Cardiology

## 2012-12-13 NOTE — Telephone Encounter (Signed)
Walk in pt From " Disability [employer]a-Card" Gave to [facility] 12/13/12/km

## 2012-12-21 ENCOUNTER — Ambulatory Visit (INDEPENDENT_AMBULATORY_CARE_PROVIDER_SITE_OTHER): Payer: 59 | Admitting: Cardiology

## 2012-12-21 DIAGNOSIS — E782 Mixed hyperlipidemia: Secondary | ICD-10-CM | POA: Diagnosis not present

## 2012-12-21 DIAGNOSIS — Z79899 Other long term (current) drug therapy: Secondary | ICD-10-CM

## 2012-12-21 LAB — ALT: ALT: 16 U/L (ref 0–53)

## 2012-12-23 LAB — NMR LIPOPROFILE WITH LIPIDS
Cholesterol, Total: 113 mg/dL (ref <200)
HDL Size: 8.5 nm — ABNORMAL LOW (ref >=9.2)
HDL-C: 35 mg/dL — ABNORMAL LOW (ref >=40)
LDL Size: 19.9 nm — ABNORMAL LOW (ref >20.5)
Large HDL-P: 2.2 umol/L — ABNORMAL LOW (ref >=4.8)
Large VLDL-P: 3.4 nmol/L — ABNORMAL HIGH (ref <=2.7)
Small LDL Particle Number: 704 nmol/L — ABNORMAL HIGH (ref <=527)

## 2012-12-26 DIAGNOSIS — M25559 Pain in unspecified hip: Secondary | ICD-10-CM | POA: Diagnosis not present

## 2012-12-27 MED ORDER — ATORVASTATIN CALCIUM 80 MG PO TABS: 80.0000 mg | ORAL_TABLET | Freq: Every day | ORAL | Status: DC

## 2012-12-27 NOTE — Progress Notes (Signed)
Advised patient that lab work was good, and we would repeat in a year. Confirmed with patient that he was taking atorvastatin 80 mg QD.

## 2013-01-11 ENCOUNTER — Telehealth: Payer: Self-pay | Admitting: Cardiology

## 2013-01-11 NOTE — Telephone Encounter (Signed)
New message   Patient is returning your call, please call him on his cell phone.

## 2013-01-16 NOTE — Telephone Encounter (Signed)
Spoke w/Dr. Anne Fu.  States he hasn't seen pt since June and will see him Mon 12/1 at 8:45 to do EKG and clear him for surgery.  Pt aware and will be here for appointment.

## 2013-01-16 NOTE — Telephone Encounter (Signed)
Mr. Blanton wanted to know if cardiac clearance has been done.  Needs to have shoulder surgery but can't schedule until has clearance.  Pt upset because he left message on 11/21 w/[facility]. Will forward to Dr. Anne Fu.

## 2013-01-16 NOTE — Telephone Encounter (Signed)
Since he has not seen me since 6/14, please have him come in for pre op office visit. Thanks.

## 2013-01-16 NOTE — Telephone Encounter (Signed)
Follow up    Pt needs confirmation that Dr. Rennis Chris.[facility] .got the surgical clearance was been done and pt is returning our call.

## 2013-01-20 ENCOUNTER — Encounter: Payer: Self-pay | Admitting: Cardiology

## 2013-01-21 ENCOUNTER — Encounter: Payer: Self-pay | Admitting: Cardiology

## 2013-01-21 ENCOUNTER — Ambulatory Visit (INDEPENDENT_AMBULATORY_CARE_PROVIDER_SITE_OTHER): Payer: 59 | Admitting: Cardiology

## 2013-01-21 VITALS — BP 126/74 | HR 74 | Ht 68.0 in | Wt 207.4 lb

## 2013-01-21 DIAGNOSIS — E785 Hyperlipidemia, unspecified: Secondary | ICD-10-CM | POA: Diagnosis not present

## 2013-01-21 DIAGNOSIS — E669 Obesity, unspecified: Secondary | ICD-10-CM | POA: Diagnosis not present

## 2013-01-21 DIAGNOSIS — I739 Peripheral vascular disease, unspecified: Secondary | ICD-10-CM | POA: Insufficient documentation

## 2013-01-21 DIAGNOSIS — I2510 Atherosclerotic heart disease of native coronary artery without angina pectoris: Secondary | ICD-10-CM | POA: Diagnosis not present

## 2013-01-21 NOTE — Patient Instructions (Signed)
Your physician recommends that you schedule a follow-up appointment in:  AS  SCHEDULED Your physician has recommended you make the following change in your medication:  HOLD  PLAVIX  5  DAYS  PRIOR TO  SURGERY

## 2013-01-21 NOTE — Progress Notes (Signed)
[address] [place], [place]  [address] Phone: [phone] Fax:  [phone]  Date:  01/21/2013   ID:  Ray Brown, DOB 1950/12/25, MRN [id number]  PCP:  Lupita Raider, MD   History of Present Illness: Ray Brown is a 62 y.o. male with coronary artery disease drug eluting stent in July 2010, bare-metal stent and RCA August 08 (had in-stent restenosis) with hypertension, hyperlipidemia, aortobifemoral bypass in 2003, sleep apnea not on CPAP here he here for followup.  10/2010-cardiac catheterization showing patent RCA stents after nuclear stress test showed inferior wall ischemia. Overall reassuring.  Here for right shoulder surgery surgery risk stratification. Had prior left shoulder surgery. No CP no SOB. Shoulder pain. No claudication. Hip pain, cortisone. Hip pain is lateral.   Wt Readings from Last 3 Encounters:  01/21/13 207 lb 6.4 oz (94.076 kg)  11/20/11 194 lb (87.998 kg)  11/20/11 194 lb (87.998 kg)     Past Medical History  Diagnosis Date  . Sleep apnea     has CPAP but does not use  . Coronary artery disease   . GERD (gastroesophageal reflux disease)   . Pneumonia   . Bronchitis   . H/O hiatal hernia   . Arthritis   . Hypercholesteremia   . Dry skin     in the back of head-left side  . Peripheral vascular disease   . Chronic back pain     Past Surgical History  Procedure Laterality Date  . Foot surgery  1980s    right foot  . Back surgery  2004  . Aortic bypass  2003    aortobifemoral bypass  . Cardiac catheterization  2010    x 2 stents Right Iliac artery; first closed so they restented the area  . Coronary angioplasty    . Shoulder surgery      left shoulder rotator cuff repair  . Colonoscopy  08/04/2009    Current Outpatient Prescriptions  Medication Sig Dispense Refill  . aspirin EC 81 MG tablet Take 81 mg by mouth at bedtime.      Marland Kitchen atorvastatin (LIPITOR) 80 MG tablet Take 1 tablet (80 mg total) by mouth daily.   90 tablet  3  . clopidogrel (PLAVIX) 75 MG tablet Take 1 tablet (75 mg total) by mouth at bedtime.  90 tablet  3  . fenofibrate 160 MG tablet Take 160 mg by mouth at bedtime.      . niacin (NIASPAN) 750 MG CR tablet Take 1,500 mg by mouth at bedtime.      . nitroGLYCERIN (NITROSTAT) 0.4 MG SL tablet Place 0.4 mg under the tongue every 5 (five) minutes as needed. For chest pain      . pantoprazole (PROTONIX) 40 MG tablet Take 40 mg by mouth at bedtime.       No current facility-administered medications for this visit.    Allergies:    Allergies  Allergen Reactions  . Ciprofloxacin Rash  . Daypro [Oxaprozin] Rash    Social History:  The patient  reports that he has quit smoking. His smoking use included Cigarettes. He smoked 0.00 packs per day for 35 years. He does not have any smokeless tobacco history on file. He reports that he does not drink alcohol or use illicit drugs.   ROS:  Please see the history of present illness.   Denies any bleeding, syncope, orthopnea, PND    PHYSICAL EXAM: VS:  BP 126/74  Pulse 74  Ht 5\' 8"  (1.727 m)  Wt 207 lb 6.4 oz (94.076 kg)  BMI 31.54 kg/m2 Well nourished, well developed, in no acute distress HEENT: normal Neck: no JVD Cardiac:  normal S1, S2; RRR; no murmur Lungs:  clear to auscultation bilaterally, no wheezing, rhonchi or rales Abd: soft, nontender, no hepatomegaly Ext: no edemaPalpable distal pulses Skin: warm and dry Neuro: no focal abnormalities noted  EKG:  Sinus rhythm rate 74 with no other significant abnormalities.     ASSESSMENT AND PLAN:  1. Preoperative risk stratification-last cardiac catheterization in 2012 showed patent right coronary artery stents. Overall he is having no active anginal symptoms. No arrhythmias. No signs of heart failure. He may proceed with surgery. He would be of low to moderate risk given his prior coronary artery disease. Dr. Francena Hanly. He will need to hold his Plavix for 5 days prior to surgery. He  is greater than one year post stent placement. He is at very low risk at this point for stent thrombosis. 2. Peripheral vascular disease-currently asymptomatic. No claudication. This is why he is on both Plavix as well as aspirin. Continue with exercise. 3. Obesity-BMI is currently greater than 30. Encourage weight loss. 4. Hyperlipidemia-last LDL was 54. Continue with current regimen. 5. We will see him back in approximately 6 months. I believe he had an appointment scheduled for June of 2015.  Signed, Donato Schultz, MD [facility]  01/21/2013 9:04 AM

## 2013-01-24 NOTE — Telephone Encounter (Signed)
Patient had office visit on 01/21/2013 and was cleared for surgery

## 2013-02-25 ENCOUNTER — Encounter (HOSPITAL_COMMUNITY): Payer: Self-pay

## 2013-02-25 NOTE — Pre-Procedure Instructions (Signed)
JOSHUE BADAL  02/25/2013   Your procedure is scheduled on:  Thurs, Jan 15 @ 12:30 PM  Report to [facility]  at 10:30 AM.  Call this number if you have problems the morning of surgery: [phone]   Remember:   Do not eat food or drink liquids after midnight.                Stop taking your Aspirin and Plavix. No Goody's,BC's,Aleve,Ibuprofen,Fish Oil,or any Herbal Medications   Do not wear jewelry  Do not wear lotions, powders, or colognes. You may wear deodorant.  Men may shave face and neck.  Do not bring valuables to the hospital.  [facility] is not responsible                  for any belongings or valuables.               Contacts, dentures or bridgework may not be worn into surgery.  Leave suitcase in the car. After surgery it may be brought to your room.  For patients admitted to the hospital, discharge time is determined by your                treatment team.               Patients discharged the day of surgery will not be allowed to drive  home.    Special Instructions: Shower using CHG 2 nights before surgery and the night before surgery.  If you shower the day of surgery use CHG.  Use special wash - you have one bottle of CHG for all showers.  You should use approximately 1/3 of the bottle for each shower.   Please read over the following fact sheets that you were given: Pain Booklet, Coughing and Deep Breathing and Surgical Site Infection Prevention

## 2013-02-26 ENCOUNTER — Encounter (HOSPITAL_COMMUNITY)
Admission: RE | Admit: 2013-02-26 | Discharge: 2013-02-26 | Disposition: A | Discharge status: Home / Self Care | Payer: 59 | Source: Ambulatory Visit | Attending: Anesthesiology | Admitting: Anesthesiology

## 2013-02-26 ENCOUNTER — Encounter (HOSPITAL_COMMUNITY): Payer: Self-pay

## 2013-02-26 ENCOUNTER — Encounter (HOSPITAL_COMMUNITY)
Admission: RE | Admit: 2013-02-26 | Discharge: 2013-02-26 | Disposition: A | Discharge status: Home / Self Care | Payer: 59 | Source: Ambulatory Visit | Attending: Orthopedic Surgery | Admitting: Orthopedic Surgery

## 2013-02-26 DIAGNOSIS — Z01812 Encounter for preprocedural laboratory examination: Secondary | ICD-10-CM | POA: Diagnosis not present

## 2013-02-26 DIAGNOSIS — Z01818 Encounter for other preprocedural examination: Secondary | ICD-10-CM | POA: Insufficient documentation

## 2013-02-26 HISTORY — DX: Shortness of breath: R06.02

## 2013-02-26 HISTORY — DX: Other complications of anesthesia, initial encounter: T88.59XA

## 2013-02-26 HISTORY — DX: Adverse effect of unspecified anesthetic, initial encounter: T41.45XA

## 2013-02-26 LAB — CBC
HCT: 48 % (ref 39.0–52.0)
HEMOGLOBIN: 16.4 g/dL (ref 13.0–17.0)
MCH: 31.3 pg (ref 26.0–34.0)
MCHC: 34.2 g/dL (ref 30.0–36.0)
MCV: 91.6 fL (ref 78.0–100.0)
Platelets: 253 10*3/uL (ref 150–400)
RBC: 5.24 MIL/uL (ref 4.22–5.81)
RDW: 13.5 % (ref 11.5–15.5)
WBC: 8.2 10*3/uL (ref 4.0–10.5)

## 2013-02-26 LAB — BASIC METABOLIC PANEL
BUN: 12 mg/dL (ref 6–23)
CO2: 24 meq/L (ref 19–32)
Calcium: 9.4 mg/dL (ref 8.4–10.5)
Chloride: 102 mEq/L (ref 96–112)
Creatinine, Ser: 0.88 mg/dL (ref 0.50–1.35)
GFR calc Af Amer: >90 mL/min (ref >90)
GFR calc non Af Amer: 90 mL/min — ABNORMAL LOW (ref >90)
GLUCOSE: 132 mg/dL — AB (ref 70–99)
POTASSIUM: 4.8 meq/L (ref 3.7–5.3)
SODIUM: 140 meq/L (ref 137–147)

## 2013-02-26 NOTE — Progress Notes (Signed)
Anesthesia Chart Review: Patient is a 63 year old male scheduled for right shoulder arthroscopy with subacromial decompression and distal clavicle resection on 03/07/13 by Dr. Onnie Graham.  History includes former smoker, OSA with CPAP use, GERD, CAD s/p RCA (DES 08/2008 and BMS 09/2006), hiatal hernia, hypercholesterolemia, PVD s/p AFBG '03, L2-3 PLIF 11/18/11. PCP is Dr. [name].   His Cardiologist is Dr. [name] who cleared patient for this procedure with low to moderate risk.   EKG on 01/21/13 showed NSR.  Cardiac cath on 11/12/10 (done after abnormal stress test on 11/04/10 that showed some inferior wall ischemia, EF 42%) showed:  1. Previously placed stent in the right coronary artery distribution, both bare-metal and drug-eluting for in-stent restenosis are widely patent. Other arteries demonstrate only minor luminal irregularities.  2. Normal left ventricular ejection fraction of 60% and there was no apparent inferior wall motion abnormality. There were several successive beats of ectopy during left ventriculogram, which reduces sensitivity.  3. No mitral regurgitation present and no significant aortic valve gradient.  Continued medical therapy was recommended at that time.   Echo on 10/17/06 showed was normal, EF 56%.   Preoperative CXR and labs noted.  If no acute changes then I anticipate that he can proceed as planned.  George Hugh [facility] Short Stay Center/Anesthesiology Phone [phone] 02/26/2013 6:32 PM

## 2013-02-26 NOTE — Progress Notes (Signed)
REQUESTED SLEEP STUDY FROM PCP (DR. Joelene Millin SHAW) [facility]

## 2013-03-01 DIAGNOSIS — J209 Acute bronchitis, unspecified: Secondary | ICD-10-CM | POA: Diagnosis not present

## 2013-03-04 DIAGNOSIS — R059 Cough, unspecified: Secondary | ICD-10-CM | POA: Diagnosis not present

## 2013-03-04 DIAGNOSIS — R05 Cough: Secondary | ICD-10-CM | POA: Diagnosis not present

## 2013-03-13 ENCOUNTER — Encounter: Payer: Self-pay | Admitting: Cardiology

## 2013-03-27 MED ORDER — CHLORHEXIDINE GLUCONATE 4 % EX LIQD: 60.0000 mL | Freq: Once | CUTANEOUS | Status: DC

## 2013-03-27 MED ORDER — LACTATED RINGERS IV SOLN
INTRAVENOUS | Status: DC
  Administered 2013-03-28: 50 mL/h via INTRAVENOUS

## 2013-03-27 MED ORDER — DEXTROSE 5 % IV SOLN
3.0000 g | INTRAVENOUS | Status: AC
  Administered 2013-03-28: 3 g via INTRAVENOUS
  Filled 2013-03-27: qty 3000

## 2013-03-28 ENCOUNTER — Ambulatory Visit (HOSPITAL_COMMUNITY): Payer: 59 | Admitting: Certified Registered Nurse Anesthetist

## 2013-03-28 ENCOUNTER — Encounter (HOSPITAL_COMMUNITY): Payer: 59 | Admitting: Vascular Surgery

## 2013-03-28 ENCOUNTER — Observation Stay (HOSPITAL_COMMUNITY)
Admission: RE | Admit: 2013-03-28 | Discharge: 2013-03-29 | Disposition: A | Discharge status: Home / Self Care | Payer: 59 | Source: Ambulatory Visit | Attending: Orthopedic Surgery | Admitting: Orthopedic Surgery

## 2013-03-28 ENCOUNTER — Encounter (HOSPITAL_COMMUNITY)
Admission: RE | Disposition: A | Discharge status: Home / Self Care | Payer: Self-pay | Source: Ambulatory Visit | Attending: Orthopedic Surgery

## 2013-03-28 ENCOUNTER — Encounter (HOSPITAL_COMMUNITY): Payer: Self-pay | Admitting: *Deleted

## 2013-03-28 DIAGNOSIS — M7580 Other shoulder lesions, unspecified shoulder: Principal | ICD-10-CM

## 2013-03-28 DIAGNOSIS — M19019 Primary osteoarthritis, unspecified shoulder: Secondary | ICD-10-CM | POA: Diagnosis not present

## 2013-03-28 DIAGNOSIS — Z886 Allergy status to analgesic agent status: Secondary | ICD-10-CM | POA: Insufficient documentation

## 2013-03-28 DIAGNOSIS — I2510 Atherosclerotic heart disease of native coronary artery without angina pectoris: Secondary | ICD-10-CM | POA: Insufficient documentation

## 2013-03-28 DIAGNOSIS — I739 Peripheral vascular disease, unspecified: Secondary | ICD-10-CM | POA: Insufficient documentation

## 2013-03-28 DIAGNOSIS — M25819 Other specified joint disorders, unspecified shoulder: Principal | ICD-10-CM | POA: Insufficient documentation

## 2013-03-28 DIAGNOSIS — Z87891 Personal history of nicotine dependence: Secondary | ICD-10-CM | POA: Insufficient documentation

## 2013-03-28 DIAGNOSIS — M7541 Impingement syndrome of right shoulder: Secondary | ICD-10-CM | POA: Diagnosis present

## 2013-03-28 DIAGNOSIS — G8918 Other acute postprocedural pain: Secondary | ICD-10-CM | POA: Diagnosis not present

## 2013-03-28 DIAGNOSIS — G4730 Sleep apnea, unspecified: Secondary | ICD-10-CM | POA: Insufficient documentation

## 2013-03-28 DIAGNOSIS — Z881 Allergy status to other antibiotic agents status: Secondary | ICD-10-CM | POA: Insufficient documentation

## 2013-03-28 DIAGNOSIS — K219 Gastro-esophageal reflux disease without esophagitis: Secondary | ICD-10-CM | POA: Insufficient documentation

## 2013-03-28 DIAGNOSIS — E7800 Pure hypercholesterolemia, unspecified: Secondary | ICD-10-CM | POA: Insufficient documentation

## 2013-03-28 HISTORY — PX: SHOULDER ARTHROSCOPY WITH SUBACROMIAL DECOMPRESSION: SHX5684

## 2013-03-28 LAB — BASIC METABOLIC PANEL
BUN: 17 mg/dL (ref 6–23)
CO2: 19 mEq/L (ref 19–32)
Calcium: 9.3 mg/dL (ref 8.4–10.5)
Chloride: 109 mEq/L (ref 96–112)
Creatinine, Ser: 0.8 mg/dL (ref 0.50–1.35)
GFR calc Af Amer: >90 mL/min (ref >90)
GLUCOSE: 120 mg/dL — AB (ref 70–99)
Potassium: 4.5 mEq/L (ref 3.7–5.3)
Sodium: 142 mEq/L (ref 137–147)

## 2013-03-28 LAB — CBC
HEMATOCRIT: 43.1 % (ref 39.0–52.0)
Hemoglobin: 14.9 g/dL (ref 13.0–17.0)
MCH: 31.8 pg (ref 26.0–34.0)
MCHC: 34.6 g/dL (ref 30.0–36.0)
MCV: 91.9 fL (ref 78.0–100.0)
Platelets: 177 10*3/uL (ref 150–400)
RBC: 4.69 MIL/uL (ref 4.22–5.81)
RDW: 13.7 % (ref 11.5–15.5)
WBC: 10.6 10*3/uL — ABNORMAL HIGH (ref 4.0–10.5)

## 2013-03-28 SURGERY — SHOULDER ARTHROSCOPY WITH SUBACROMIAL DECOMPRESSION
Anesthesia: Regional | Site: Shoulder | Laterality: Right

## 2013-03-28 MED ORDER — NEOSTIGMINE METHYLSULFATE 1 MG/ML IJ SOLN
INTRAMUSCULAR | Status: AC
  Filled 2013-03-28: qty 10

## 2013-03-28 MED ORDER — DIPHENHYDRAMINE HCL 12.5 MG/5ML PO ELIX
12.5000 mg | ORAL_SOLUTION | ORAL | Status: DC | PRN
  Filled 2013-03-28: qty 10

## 2013-03-28 MED ORDER — MIDAZOLAM HCL 2 MG/2ML IJ SOLN
INTRAMUSCULAR | Status: AC
  Filled 2013-03-28: qty 2

## 2013-03-28 MED ORDER — CLOPIDOGREL BISULFATE 75 MG PO TABS
75.0000 mg | ORAL_TABLET | Freq: Every day | ORAL | Status: DC
  Administered 2013-03-28: 75 mg via ORAL
  Filled 2013-03-28 (×2): qty 1

## 2013-03-28 MED ORDER — MIDAZOLAM HCL 5 MG/ML IJ SOLN
1.0000 mg | Freq: Once | INTRAMUSCULAR | Status: AC
  Administered 2013-03-28: 2 mg via INTRAVENOUS

## 2013-03-28 MED ORDER — PROPOFOL 10 MG/ML IV BOLUS
INTRAVENOUS | Status: DC | PRN
  Administered 2013-03-28: 140 mg via INTRAVENOUS

## 2013-03-28 MED ORDER — KETOROLAC TROMETHAMINE 30 MG/ML IJ SOLN
INTRAMUSCULAR | Status: AC
  Administered 2013-03-28: 15 mg
  Filled 2013-03-28: qty 1

## 2013-03-28 MED ORDER — ONDANSETRON HCL 4 MG PO TABS: 4.0000 mg | ORAL_TABLET | Freq: Four times a day (QID) | ORAL | Status: DC | PRN

## 2013-03-28 MED ORDER — FENTANYL CITRATE 0.05 MG/ML IJ SOLN
INTRAMUSCULAR | Status: AC
  Administered 2013-03-28: 50 ug via INTRAVENOUS
  Filled 2013-03-28: qty 2

## 2013-03-28 MED ORDER — METOCLOPRAMIDE HCL 5 MG PO TABS
5.0000 mg | ORAL_TABLET | Freq: Three times a day (TID) | ORAL | Status: DC | PRN
  Filled 2013-03-28: qty 2

## 2013-03-28 MED ORDER — DOCUSATE SODIUM 100 MG PO CAPS
100.0000 mg | ORAL_CAPSULE | Freq: Two times a day (BID) | ORAL | Status: DC
  Filled 2013-03-28 (×3): qty 1

## 2013-03-28 MED ORDER — FENTANYL CITRATE 0.05 MG/ML IJ SOLN
INTRAMUSCULAR | Status: AC
  Filled 2013-03-28: qty 5

## 2013-03-28 MED ORDER — ONDANSETRON HCL 4 MG/2ML IJ SOLN
INTRAMUSCULAR | Status: AC
  Filled 2013-03-28: qty 2

## 2013-03-28 MED ORDER — ROCURONIUM BROMIDE 100 MG/10ML IV SOLN
INTRAVENOUS | Status: DC | PRN
  Administered 2013-03-28: 50 mg via INTRAVENOUS

## 2013-03-28 MED ORDER — ONDANSETRON HCL 4 MG/2ML IJ SOLN
INTRAMUSCULAR | Status: DC | PRN
  Administered 2013-03-28: 4 mg via INTRAVENOUS

## 2013-03-28 MED ORDER — POLYETHYLENE GLYCOL 3350 17 G PO PACK
17.0000 g | PACK | Freq: Every day | ORAL | Status: DC | PRN
  Filled 2013-03-28: qty 1

## 2013-03-28 MED ORDER — NIACIN ER (ANTIHYPERLIPIDEMIC) 500 MG PO TBCR
1500.0000 mg | EXTENDED_RELEASE_TABLET | Freq: Every day | ORAL | Status: DC
  Administered 2013-03-28: 1500 mg via ORAL
  Filled 2013-03-28 (×2): qty 3

## 2013-03-28 MED ORDER — PHENOL 1.4 % MT LIQD: 1.0000 | OROMUCOSAL | Status: DC | PRN

## 2013-03-28 MED ORDER — ONDANSETRON HCL 4 MG/2ML IJ SOLN: 4.0000 mg | Freq: Four times a day (QID) | INTRAMUSCULAR | Status: DC | PRN

## 2013-03-28 MED ORDER — HYDROMORPHONE HCL PF 1 MG/ML IJ SOLN: 0.2500 mg | INTRAMUSCULAR | Status: DC | PRN

## 2013-03-28 MED ORDER — MENTHOL 3 MG MT LOZG
1.0000 | LOZENGE | OROMUCOSAL | Status: DC | PRN
Start: 2013-03-28 — End: 2013-03-29

## 2013-03-28 MED ORDER — ROPIVACAINE HCL 5 MG/ML IJ SOLN
INTRAMUSCULAR | Status: DC | PRN
  Administered 2013-03-28: 20 mL via PERINEURAL

## 2013-03-28 MED ORDER — ASPIRIN EC 81 MG PO TBEC
81.0000 mg | DELAYED_RELEASE_TABLET | Freq: Every day | ORAL | Status: DC
  Administered 2013-03-28: 81 mg via ORAL
  Filled 2013-03-28 (×2): qty 1

## 2013-03-28 MED ORDER — OXYCODONE-ACETAMINOPHEN 5-325 MG PO TABS
1.0000 | ORAL_TABLET | ORAL | Status: DC | PRN
  Administered 2013-03-28: 1 via ORAL
  Administered 2013-03-29: 2 via ORAL
  Filled 2013-03-28: qty 1
  Filled 2013-03-28: qty 2

## 2013-03-28 MED ORDER — KETOROLAC TROMETHAMINE 30 MG/ML IJ SOLN
INTRAMUSCULAR | Status: AC
  Filled 2013-03-28: qty 1

## 2013-03-28 MED ORDER — GLYCOPYRROLATE 0.2 MG/ML IJ SOLN
INTRAMUSCULAR | Status: AC
  Filled 2013-03-28: qty 4

## 2013-03-28 MED ORDER — LACTATED RINGERS IV SOLN
INTRAVENOUS | Status: DC
Start: 2013-03-28 — End: 2013-03-29

## 2013-03-28 MED ORDER — LIDOCAINE HCL (CARDIAC) 20 MG/ML IV SOLN
INTRAVENOUS | Status: AC
  Filled 2013-03-28: qty 5

## 2013-03-28 MED ORDER — NITROGLYCERIN 0.4 MG SL SUBL: 0.4000 mg | SUBLINGUAL_TABLET | SUBLINGUAL | Status: DC | PRN

## 2013-03-28 MED ORDER — PANTOPRAZOLE SODIUM 40 MG PO TBEC
40.0000 mg | DELAYED_RELEASE_TABLET | Freq: Every day | ORAL | Status: DC
  Administered 2013-03-28: 40 mg via ORAL
  Filled 2013-03-28: qty 1

## 2013-03-28 MED ORDER — FENTANYL CITRATE 0.05 MG/ML IJ SOLN
50.0000 ug | Freq: Once | INTRAMUSCULAR | Status: AC
Start: 2013-03-28 — End: 2013-03-28
  Administered 2013-03-28: 50 ug via INTRAVENOUS

## 2013-03-28 MED ORDER — ROCURONIUM BROMIDE 50 MG/5ML IV SOLN
INTRAVENOUS | Status: AC
  Filled 2013-03-28: qty 1

## 2013-03-28 MED ORDER — SODIUM CHLORIDE 0.9 % IR SOLN
Status: DC | PRN
  Administered 2013-03-28 (×2): 3000 mL

## 2013-03-28 MED ORDER — GLYCOPYRROLATE 0.2 MG/ML IJ SOLN
INTRAMUSCULAR | Status: AC
  Filled 2013-03-28: qty 2

## 2013-03-28 MED ORDER — GLYCOPYRROLATE 0.2 MG/ML IJ SOLN
INTRAMUSCULAR | Status: DC | PRN
  Administered 2013-03-28: .8 mg via INTRAVENOUS

## 2013-03-28 MED ORDER — ATORVASTATIN CALCIUM 80 MG PO TABS
80.0000 mg | ORAL_TABLET | Freq: Every day | ORAL | Status: DC
  Administered 2013-03-28: 80 mg via ORAL
  Filled 2013-03-28 (×2): qty 1

## 2013-03-28 MED ORDER — FENOFIBRATE 160 MG PO TABS
160.0000 mg | ORAL_TABLET | Freq: Every day | ORAL | Status: DC
  Administered 2013-03-28: 160 mg via ORAL
  Filled 2013-03-28 (×2): qty 1

## 2013-03-28 MED ORDER — ALUM & MAG HYDROXIDE-SIMETH 200-200-20 MG/5ML PO SUSP: 30.0000 mL | ORAL | Status: DC | PRN

## 2013-03-28 MED ORDER — BISACODYL 5 MG PO TBEC
5.0000 mg | DELAYED_RELEASE_TABLET | Freq: Every day | ORAL | Status: DC | PRN
  Filled 2013-03-28: qty 1

## 2013-03-28 MED ORDER — ACETAMINOPHEN 325 MG PO TABS: 650.0000 mg | ORAL_TABLET | Freq: Four times a day (QID) | ORAL | Status: DC | PRN

## 2013-03-28 MED ORDER — FENTANYL CITRATE 0.05 MG/ML IJ SOLN
INTRAMUSCULAR | Status: DC | PRN
  Administered 2013-03-28: 100 ug via INTRAVENOUS

## 2013-03-28 MED ORDER — METOCLOPRAMIDE HCL 5 MG/ML IJ SOLN
5.0000 mg | Freq: Three times a day (TID) | INTRAMUSCULAR | Status: DC | PRN
  Filled 2013-03-28: qty 2

## 2013-03-28 MED ORDER — LACTATED RINGERS IV SOLN: INTRAVENOUS | Status: DC

## 2013-03-28 MED ORDER — DIAZEPAM 5 MG PO TABS
2.5000 mg | ORAL_TABLET | Freq: Four times a day (QID) | ORAL | Status: DC | PRN
  Administered 2013-03-28: 5 mg via ORAL
  Filled 2013-03-28: qty 1

## 2013-03-28 MED ORDER — TEMAZEPAM 15 MG PO CAPS: 15.0000 mg | ORAL_CAPSULE | Freq: Every evening | ORAL | Status: DC | PRN

## 2013-03-28 MED ORDER — KETOROLAC TROMETHAMINE 15 MG/ML IJ SOLN
15.0000 mg | Freq: Four times a day (QID) | INTRAMUSCULAR | Status: DC
  Administered 2013-03-28 – 2013-03-29 (×3): 15 mg via INTRAVENOUS
  Filled 2013-03-28 (×7): qty 1

## 2013-03-28 MED ORDER — LIDOCAINE HCL (CARDIAC) 20 MG/ML IV SOLN
INTRAVENOUS | Status: DC | PRN
  Administered 2013-03-28: 70 mg via INTRAVENOUS

## 2013-03-28 MED ORDER — PROPOFOL 10 MG/ML IV BOLUS
INTRAVENOUS | Status: AC
  Filled 2013-03-28: qty 20

## 2013-03-28 MED ORDER — ACETAMINOPHEN 650 MG RE SUPP: 650.0000 mg | Freq: Four times a day (QID) | RECTAL | Status: DC | PRN

## 2013-03-28 MED ORDER — LACTATED RINGERS IV SOLN
INTRAVENOUS | Status: DC | PRN
  Administered 2013-03-28 (×2): via INTRAVENOUS

## 2013-03-28 MED ORDER — NEOSTIGMINE METHYLSULFATE 1 MG/ML IJ SOLN
INTRAMUSCULAR | Status: DC | PRN
  Administered 2013-03-28: 5 mg via INTRAVENOUS

## 2013-03-28 SURGICAL SUPPLY — 63 items
BLADE CUTTER GATOR 3.5 (BLADE) ×3 IMPLANT
BLADE GREAT WHITE 4.2 (BLADE) ×2 IMPLANT
BLADE GREAT WHITE 4.2MM (BLADE) ×1
BLADE SURG 11 STRL SS (BLADE) ×3 IMPLANT
BOOTCOVER CLEANROOM LRG (PROTECTIVE WEAR) ×6 IMPLANT
BUR 3.5 LG SPHERICAL (BURR) IMPLANT
BUR OVAL 4.0 (BURR) ×3 IMPLANT
BURR 3.5 LG SPHERICAL (BURR)
BURR 3.5MM LG SPHERICAL (BURR)
CANISTER SUCT LVC 12 LTR MEDI- (MISCELLANEOUS) ×3 IMPLANT
CANNULA ACUFLEX KIT 5X76 (CANNULA) ×3 IMPLANT
CANNULA DRILOCK 5.0MMX75MM (CANNULA)
CANNULA DRILOCK 5.0X75 (CANNULA) IMPLANT
CLOSURE WOUND 1/2 X4 (GAUZE/BANDAGES/DRESSINGS) ×1
CLOTH BEACON ORANGE TIMEOUT ST (SAFETY) ×3 IMPLANT
CONNECTOR 5 IN 1 STRAIGHT STRL (MISCELLANEOUS) ×3 IMPLANT
DRAPE INCISE 23X17 IOBAN STRL (DRAPES) ×2
DRAPE INCISE 23X17 STRL (DRAPES) ×1 IMPLANT
DRAPE INCISE IOBAN 23X17 STRL (DRAPES) ×1 IMPLANT
DRAPE INCISE IOBAN 66X45 STRL (DRAPES) ×3 IMPLANT
DRAPE STERI 35X30 U-POUCH (DRAPES) ×3 IMPLANT
DRAPE SURG 17X11 SM STRL (DRAPES) ×3 IMPLANT
DRAPE U-SHAPE 47X51 STRL (DRAPES) ×3 IMPLANT
DRSG PAD ABDOMINAL 8X10 ST (GAUZE/BANDAGES/DRESSINGS) ×6 IMPLANT
DURAPREP 26ML APPLICATOR (WOUND CARE) ×6 IMPLANT
ELECT REM PT RETURN 9FT ADLT (ELECTROSURGICAL) ×3
ELECTRODE REM PT RTRN 9FT ADLT (ELECTROSURGICAL) ×1 IMPLANT
GLOVE BIO SURGEON STRL SZ7.5 (GLOVE) ×3 IMPLANT
GLOVE BIO SURGEON STRL SZ8 (GLOVE) ×3 IMPLANT
GLOVE EUDERMIC 7 POWDERFREE (GLOVE) ×3 IMPLANT
GLOVE SS BIOGEL STRL SZ 7.5 (GLOVE) ×1 IMPLANT
GLOVE SUPERSENSE BIOGEL SZ 7.5 (GLOVE) ×2
GOWN STRL NON-REIN LRG LVL3 (GOWN DISPOSABLE) ×3 IMPLANT
GOWN STRL REIN XL XLG (GOWN DISPOSABLE) ×12 IMPLANT
KIT BASIN OR (CUSTOM PROCEDURE TRAY) ×3 IMPLANT
KIT ROOM TURNOVER OR (KITS) ×3 IMPLANT
KIT SHOULDER TRACTION (DRAPES) ×3 IMPLANT
MANIFOLD NEPTUNE II (INSTRUMENTS) ×3 IMPLANT
NDL SPNL 18GX3.5 QUINCKE PK (NEEDLE) ×1 IMPLANT
NDL SUT 6 .5 CRC .975X.05 MAYO (NEEDLE) IMPLANT
NEEDLE MAYO TAPER (NEEDLE)
NEEDLE SPNL 18GX3.5 QUINCKE PK (NEEDLE) ×3 IMPLANT
NS IRRIG 1000ML POUR BTL (IV SOLUTION) ×3 IMPLANT
PACK SHOULDER (CUSTOM PROCEDURE TRAY) ×3 IMPLANT
PAD ABD 8X10 STRL (GAUZE/BANDAGES/DRESSINGS) ×4 IMPLANT
PAD ARMBOARD 7.5X6 YLW CONV (MISCELLANEOUS) ×6 IMPLANT
SET ARTHROSCOPY TUBING (MISCELLANEOUS) ×3
SET ARTHROSCOPY TUBING LN (MISCELLANEOUS) ×1 IMPLANT
SLING ARM FOAM STRAP XLG (SOFTGOODS) ×2 IMPLANT
SLING ARM LRG ADULT FOAM STRAP (SOFTGOODS) IMPLANT
SLING ARM MED ADULT FOAM STRAP (SOFTGOODS) ×3 IMPLANT
SPONGE GAUZE 4X4 12PLY (GAUZE/BANDAGES/DRESSINGS) ×3 IMPLANT
SPONGE LAP 4X18 X RAY DECT (DISPOSABLE) ×3 IMPLANT
STRIP CLOSURE SKIN 1/2X4 (GAUZE/BANDAGES/DRESSINGS) ×2 IMPLANT
SUT MNCRL AB 3-0 PS2 18 (SUTURE) ×3 IMPLANT
SUT PDS AB 0 CT 36 (SUTURE) IMPLANT
SUT RETRIEVER GRASP 30 DEG (SUTURE) IMPLANT
SYR 20CC LL (SYRINGE) ×3 IMPLANT
TAPE PAPER 3X10 WHT MICROPORE (GAUZE/BANDAGES/DRESSINGS) ×3 IMPLANT
TOWEL OR 17X24 6PK STRL BLUE (TOWEL DISPOSABLE) ×3 IMPLANT
TOWEL OR 17X26 10 PK STRL BLUE (TOWEL DISPOSABLE) ×3 IMPLANT
WAND SUCTION MAX 4MM 90S (SURGICAL WAND) ×3 IMPLANT
WATER STERILE IRR 1000ML POUR (IV SOLUTION) ×3 IMPLANT

## 2013-03-28 NOTE — Transfer of Care (Signed)
Immediate Anesthesia Transfer of Care Note  Patient: Ray Brown  Procedure(s) Performed: Procedure(s): RIGHT SHOULDER ARTHROSCOPY WITH SUBACROMIAL DECOMPRESSION AND DISTAL CLAVICLE RESECTION (Right)  Patient Location: PACU  Anesthesia Type:General and Regional  Level of Consciousness: awake and alert   Airway & Oxygen Therapy: Patient Spontanous Breathing and Patient connected to nasal cannula oxygen  Post-op Assessment: Report given to PACU RN, Post -op Vital signs reviewed and stable and Patient moving all extremities X 4  Post vital signs: Reviewed and stable  Complications: No apparent anesthesia complications

## 2013-03-28 NOTE — Discharge Instructions (Signed)
° °  Metta Clines. Supple, M.D., F.A.A.O.S. Orthopaedic Surgery Specializing in Arthroscopic and Reconstructive Surgery of the Shoulder and Knee [phone] [address] [place], [place] [address] - Fax [phone]   POST-OP SHOULDER ARTHROSCOPY INSTRUCTIONS  1. Call the office at [phone] to schedule your first post-op appointment 7-10 days from the date of your surgery.  2. Leave the steri-strips in place over your incisions when performing dressing changes and showering. You may remove your dressings and begin showering 72 hours from surgery. You can expect drainage that is clear to bloody in nature that occasionally will soak through your dressings. If this occurs go ahead and perform a dressing change. The drainage should lessen daily and when there is no drainage from your incisions feel free to go without a dressing.  3. Wear your sling for comfort. You may come out of your sling for ad lib activity and even decide not to use the sling at all. If you find you are more comfortable in your sling, make sure you come out of your sling at least 3-4 times a day to do the exercises that are included below.  4. Range of motion to your elbow, wrist, and hand are encouraged 3-5 times daily. Exercise to your hand and fingers helps to reduce swelling you may experience.  5. Utilize ice to the shoulder 3-4 times minimum a day and additionally if you are experiencing pain.  6. You may drive when safely off narcotics and muscle relaxants.  7. If you had a block pre-operatively to provide post-op pain relief you may want to go ahead and begin utilizing your pain meds as your arm begins to wake up. Blocks can sometimes last up to 16-18 hours. If you are still pain-free prior to going to bed you may want to strongly consider taking a pain medication to avoid being awakened in the night with the onset of pain. A muscle relaxant is also provided for you should you experience muscle spasms. It  is recommended that if you are experiencing pain that your pain medication alone is not controlling, add the muscle relaxant along with the pain medication which can give additional pain relief. The first one to two days is generally the most severe of your pain and then should gradually decrease. As your pain lessens it is recommended that you decrease your use of the pain medications to an "as needed basis" only and to always comply with the recommended dosages of the pain medications.  8. Pain medications can produce constipation along with their use. If you experience this, the use of an over the counter stool softener or laxative daily is recommended.   9. For additional questions or concerns, please do not hesitate to call the office. If after hours there is an answering service to forward your concerns to the physician on call.   POST-OP EXERCISES  The pendulum exercises should be performed while bending at the waist as far over as possible thereby letting gravity do the work for you.  Range of Motion Exercises: Pendulum (circular)  Repeat 20 times. Do 3 sessions per day.     Range of Motion Exercises: Pendulum (side-to-side)  Repeat 20 times. Do 3 sessions per day.    Range of Motion Exercises (self-stretching activities):  Slide arm up wall with palm toward you, moving closer to the wall. Hold for 5 seconds.  Repeat 10 times. Do 3 sessions per day.

## 2013-03-28 NOTE — Anesthesia Procedure Notes (Addendum)
Anesthesia Regional Block:  Interscalene brachial plexus block  Pre-Anesthetic Checklist: ,, timeout performed, Correct Patient, Correct Site, Correct Laterality, Correct Procedure, Correct Position, site marked, Risks and benefits discussed,  Surgical consent,  Pre-op evaluation,  At surgeon's request and post-op pain management  Laterality: Right  Prep: chloraprep       Needles:  Injection technique: Single-shot  Needle Type: Echogenic Stimulator Needle          Additional Needles:  Procedures: ultrasound guided (picture in chart) Interscalene brachial plexus block Narrative:  Start time: 03/28/2013 9:34 AM End time: 03/28/2013 9:41 AM Injection made incrementally with aspirations every 5 mL.  Performed by: Personally  Anesthesiologist: Moser   Procedure Name: Intubation Date/Time: 03/28/2013 10:28 AM Performed by: Jacob Moores Pre-anesthesia Checklist: Patient identified, Emergency Drugs available, Suction available and Patient being monitored Patient Re-evaluated:Patient Re-evaluated prior to inductionOxygen Delivery Method: Circle system utilized Preoxygenation: Pre-oxygenation with 100% oxygen Intubation Type: IV induction Ventilation: Mask ventilation without difficulty and Oral airway inserted - appropriate to patient size Laryngoscope Size: Sabra Heck and 2 Grade View: Grade II Tube type: Oral Tube size: 7.5 mm Number of attempts: 1 Airway Equipment and Method: Stylet and Oral airway Placement Confirmation: ETT inserted through vocal cords under direct vision,  positive ETCO2 and breath sounds checked- equal and bilateral Secured at: 22 cm Tube secured with: Tape Dental Injury: Teeth and Oropharynx as per pre-operative assessment

## 2013-03-28 NOTE — Preoperative (Signed)
Beta Blockers   Reason not to administer Beta Blockers:Not Applicable 

## 2013-03-28 NOTE — H&P (Signed)
Ray Brown    Chief Complaint: right shoulder chronic impingement HPI: The patient is a 63 y.o. male with chronic right shoulder impingement refractory to conservative mangement  Past Medical History  Diagnosis Date  . Coronary artery disease   . GERD (gastroesophageal reflux disease)   . Bronchitis   . H/O hiatal hernia   . Arthritis   . Hypercholesteremia   . Dry skin     in the back of head-left side  . Chronic back pain   . Complication of anesthesia     difficulty breathing s/p back surgery at [facility] 2013  . Pneumonia   . Shortness of breath   . Sleep apnea     has CPAP but does not use  . Peripheral vascular disease     Past Surgical History  Procedure Laterality Date  . Foot surgery  1980s    right foot  . Back surgery  2004  . Aortic bypass  2003    aortobifemoral bypass  . Cardiac catheterization  2010    x 2 stents Right Iliac artery; first closed so they restented the area  . Shoulder surgery      left shoulder rotator cuff repair  . Colonoscopy  08/04/2009  . Coronary angioplasty      2 stents  . Upper gi endoscopy      Family History  Problem Relation Age of Onset  . Colon cancer Mother   . Heart attack Father     , massive  . Coronary artery disease Brother   . Thyroid cancer Brother     Social History:  reports that he has quit smoking. His smoking use included Cigarettes. He smoked 0.00 packs per day for 35 years. He does not have any smokeless tobacco history on file. He reports that he does not drink alcohol or use illicit drugs.  Allergies:  Allergies  Allergen Reactions  . Ciprofloxacin Rash  . Daypro [Oxaprozin] Rash    Medications Prior to Admission  Medication Sig Dispense Refill  . aspirin EC 81 MG tablet Take 81 mg by mouth at bedtime.      Marland Kitchen atorvastatin (LIPITOR) 80 MG tablet Take 1 tablet (80 mg total) by mouth daily.  90 tablet  3  . clopidogrel (PLAVIX) 75 MG tablet Take 1 tablet (75 mg total) by mouth at bedtime.  90  tablet  3  . fenofibrate 160 MG tablet Take 160 mg by mouth at bedtime.      . niacin (NIASPAN) 750 MG CR tablet Take 1,500 mg by mouth at bedtime.      . pantoprazole (PROTONIX) 40 MG tablet Take 40 mg by mouth at bedtime.      . nitroGLYCERIN (NITROSTAT) 0.4 MG SL tablet Place 0.4 mg under the tongue every 5 (five) minutes as needed. For chest pain         Physical Exam: right shoulder with painful and restricted motion as noted at recent office visits  Vitals  Temp:  [98 F (36.7 C)] 98 F (36.7 C) (02/05 0817) Pulse Rate:  [66-75] 73 (02/05 0940) Resp:  [8-25] 15 (02/05 0940) BP: (109-126)/(48-66) 122/52 mmHg (02/05 0920) SpO2:  [96 %-100 %] 97 % (02/05 0940)  Assessment/Plan  Impression: right shoulder chronic impingement  Plan of Action: Procedure(s): RIGHT SHOULDER ARTHROSCOPY WITH SUBACROMIAL DECOMPRESSION AND DISTAL CLAVICLE RESECTION  Jashon Ishida M 03/28/2013, 9:43 AM

## 2013-03-28 NOTE — Op Note (Signed)
03/28/2013  11:33 AM  PATIENT:   Ray Brown  63 y.o. male  PRE-OPERATIVE DIAGNOSIS:  right shoulder chronic impingement, AC joint OA  POST-OPERATIVE DIAGNOSIS:  Same with type 1 SLAP lesion  PROCEDURE:  RSA, labral debridement, SAD, DCR  SURGEON:  Donovan Gatchel, Metta Clines M.D.  ASSISTANTS: Shuford pac   ANESTHESIA:   GET + ISB  EBL: min  SPECIMEN:  none  Drains: none   PATIENT DISPOSITION:  PACU - hemodynamically stable.    PLAN OF CARE: Admit for overnight observation  Dictation# [id number]

## 2013-03-28 NOTE — Anesthesia Postprocedure Evaluation (Signed)
  Anesthesia Post-op Note  Patient: Ray Brown  Procedure(s) Performed: Procedure(s): RIGHT SHOULDER ARTHROSCOPY WITH SUBACROMIAL DECOMPRESSION AND DISTAL CLAVICLE RESECTION (Right)  Patient Location: PACU  Anesthesia Type:General and Regional  Level of Consciousness: awake, alert  and oriented  Airway and Oxygen Therapy: Patient Spontanous Breathing and Patient connected to nasal cannula oxygen  Post-op Pain: mild  Post-op Assessment: Post-op Vital signs reviewed, Patient's Cardiovascular Status Stable, Respiratory Function Stable, Patent Airway, No signs of Nausea or vomiting and Pain level controlled  Post-op Vital Signs: Reviewed and stable  Complications: No apparent anesthesia complications

## 2013-03-28 NOTE — Anesthesia Preprocedure Evaluation (Addendum)
Anesthesia Evaluation  Patient identified by MRN, date of birth, ID band Patient awake    Reviewed: Allergy & Precautions, H&P , NPO status , Patient's Chart, lab work & pertinent test results  History of Anesthesia Complications (+) history of anesthetic complications  Airway Mallampati: II TM Distance: >3 FB Neck ROM: Full    Dental  (+) Dental Advisory Given and Teeth Intact   Pulmonary shortness of breath, sleep apnea , former smoker,    Pulmonary exam normal       Cardiovascular Exercise Tolerance: Good + CAD and + Peripheral Vascular Disease - CHF - dysrhythmias - pacemakerRhythm:Regular Rate:Normal     Neuro/Psych negative neurological ROS  negative psych ROS   GI/Hepatic Neg liver ROS, hiatal hernia, GERD-  Medicated,  Endo/Other  diabetes, Type 2Morbid obesity  Renal/GU negative Renal ROS     Musculoskeletal  (+) Arthritis -,   Abdominal   Peds  Hematology   Anesthesia Other Findings   Reproductive/Obstetrics                          Anesthesia Physical Anesthesia Plan  ASA: III  Anesthesia Plan: General and Regional   Post-op Pain Management:    Induction: Intravenous  Airway Management Planned: Oral ETT  Additional Equipment:   Intra-op Plan:   Post-operative Plan: Extubation in OR  Informed Consent: I have reviewed the patients History and Physical, chart, labs and discussed the procedure including the risks, benefits and alternatives for the proposed anesthesia with the patient or authorized representative who has indicated his/her understanding and acceptance.   Dental advisory given  Plan Discussed with: CRNA, Anesthesiologist and Surgeon  Anesthesia Plan Comments:         Anesthesia Quick Evaluation

## 2013-03-29 MED ORDER — OXYCODONE-ACETAMINOPHEN 5-325 MG PO TABS: 1.0000 | ORAL_TABLET | ORAL | Status: DC | PRN

## 2013-03-29 MED ORDER — DIAZEPAM 5 MG PO TABS: 2.5000 mg | ORAL_TABLET | Freq: Four times a day (QID) | ORAL | Status: DC | PRN

## 2013-03-29 NOTE — Op Note (Signed)
Ray Brown, Ray Brown               ACCOUNT NO.:  [id number]  MEDICAL RECORD NO.:  [id number]  LOCATION:  [id number]                        FACILITY:  [facility]  PHYSICIAN:  Metta Clines. Norvin Ohlin, M.D.  DATE OF BIRTH:  1950/08/31  DATE OF PROCEDURE:  03/28/2013 DATE OF DISCHARGE:                              OPERATIVE REPORT   PREOPERATIVE DIAGNOSES: 1. Chronic right shoulder impingement syndrome. 2. Right shoulder symptomatic acromioclavicular joint arthropathy.  POSTOPERATIVE DIAGNOSES: 1. Chronic right shoulder impingement syndrome. 2. Right shoulder symptomatic acromioclavicular joint arthropathy. 3. Right shoulder complex and extensive superior labral tear.  PROCEDURES: 1. Right shoulder examination anesthesia. 2. Right shoulder glenohumeral joint diagnostic arthroscopy. 3. Debridement of type 1 SLAP lesion. 4. Arthroscopic subacromial decompression bursectomy. 5. Arthroscopic distal clavicle resection.  SURGEON:  Metta Clines. Micheal Murad, M.D.  Terrence DupontOlivia Mackie A. Shuford, P.A.-C.  ANESTHESIA:  General endotracheal as well as an interscalene block.  ESTIMATED BLOOD LOSS:  Minimal.  DRAINS:  None.  HISTORY:  Mr. Swart is a 63 year old gentleman who has had chronic and progressive increasing right shoulder pain with impingement syndrome that has been refractory to prolong and multiple attempts at conservative management.  He is brought to the operating room at this time for planned right shoulder arthroscopy as described below.  Preoperatively, I counseled Mr. Rorie on treatment options as well as risks versus benefits thereof.  Possible surgical complications were all reviewed including potential for bleeding, infection, neurovascular injury, persistent pain, loss of motion, anesthetic complication, possible need for additional surgery.  He understands and accepts and agrees with our planned procedure.  PROCEDURE IN DETAIL:  After undergoing routine preop evaluation, the patient  received prophylactic antibiotics, and an interscalene block was established in the holding area by the Anesthesia Department.  He was placed supine on the operating table, underwent smooth induction of a general endotracheal anesthesia.  Turned to left lateral decubitus position on beanbag, and appropriately padded and protected.  Right shoulder examination under anesthesia revealed full motion and no instability patterns were noted.  Right arm was then sustained at 70 degrees of abduction with 10 pounds of traction.  The right shoulder girdle region was sterilely prepped and draped in standard fashion. Time-out was called.  Posterior portal established in the glenohumeral joint, anterior portal established under direct visualization.  The glenohumeral articular surfaces were all in excellent condition.  No instability patterns were noted.  There was however significant degenerative tearing of the superior half of the labrum consistent with a type 1 SLAP lesion.  There was a relatively large sublabral sulcus superiorly, but I did not appreciate any obvious pathologic instability to biceps anchor.  Biceps tendon was normal caliber with no distal instability.  The superior labral tear was debrided with a shaver back to stable margin.  The rotator cuff was carefully inspected, found to be intact throughout with no fraying or obvious degeneration.  At this point, fluid and instruments were then removed from the glenohumeral joint.  The arms dropped down to 30 degrees of abduction with arthroscope introduced in the subacromial space to the posterior portal and a direct lateral portal was established in the subacromial space. Abundant dense bursal tissue and multiple  adhesions were encountered and these were all divided and excised with a combination of shaver and Stryker wand.  The wand was then used to remove the periosteum and undersurface of the anterior half of the acromion, and a  subacromial decompression was performed with burr creating a type 1 morphology. Pedicles were then established directly anterior to the distal clavicle, and a distal clavicle resection was performed with a burr.  Care was taken to confirm visualization of the entire circumference of the distal clavicle to ensure adequate removal of bone.  We then completed the subacromial/subdeltoid bursectomy.  The bursal surface of rotator cuff was carefully inspected and probed, found to be intact throughout. Final hemostasis was obtained.  Fluid and instruments removed.  The portals were closed with Monocryl and Steri-Strips.  A dry dressing was taped at the right shoulder.  Right arm was placed in a sling.  The patient was awakened, extubated, and taken to recovery room in stable condition.  Tracy A. Shuford, P.A.-C., was used an Environmental consultant throughout this case essential for help with positioning of the patient, positioning of the extremity, and management of the arthroscopic equipment, tissue manipulation, wound closure, and intraoperative decision making.     Metta Clines. Jyasia Markoff, M.D.     KMS/MEDQ  D:  03/28/2013  T:  03/29/2013  Job:  [id number]

## 2013-03-29 NOTE — Progress Notes (Signed)
Pt and wife given D/C instructions with Rx's, verbal understanding was given. Pt's dressing changed per MD order. Pt was stable @ D/C and had no other needs. Pt D/C'd home via walking per MD order. Holli Humbles, RN

## 2013-03-29 NOTE — Discharge Summary (Signed)
PATIENT ID:      Ray Brown  MRN:     [id number] DOB/AGE:    January 05, 1951 / 63 y.o.     DISCHARGE SUMMARY  ADMISSION DATE:    03/28/2013 DISCHARGE DATE:    ADMISSION DIAGNOSIS: right shoulder chronic impingement Past Medical History  Diagnosis Date  . Coronary artery disease   . GERD (gastroesophageal reflux disease)   . Bronchitis   . H/O hiatal hernia   . Arthritis   . Hypercholesteremia   . Dry skin     in the back of head-left side  . Chronic back pain   . Complication of anesthesia     difficulty breathing s/p back surgery at [facility] 2013  . Pneumonia   . Shortness of breath   . Sleep apnea     has CPAP but does not use  . Peripheral vascular disease     DISCHARGE DIAGNOSIS:   Active Problems:   Impingement syndrome of right shoulder   PROCEDURE: Procedure(s): RIGHT SHOULDER ARTHROSCOPY WITH SUBACROMIAL DECOMPRESSION AND DISTAL CLAVICLE RESECTION on 03/28/2013  CONSULTS:  none   HISTORY:  See H&P in chart.  HOSPITAL COURSE:  Ray Brown is a 63 y.o. admitted on 03/28/2013 with a chief complaint of right shoulder pain refractory to conservative care, and found to have a diagnosis of right shoulder chronic impingement.  They were brought to the operating room on 03/28/2013 and underwent Procedure(s): RIGHT SHOULDER ARTHROSCOPY WITH SUBACROMIAL DECOMPRESSION AND DISTAL CLAVICLE RESECTION.    They were given perioperative antibiotics: Anti-infectives   Start     Dose/Rate Route Frequency Ordered Stop   03/28/13 0600  ceFAZolin (ANCEF) 3 g in dextrose 5 % 50 mL IVPB     3 g 160 mL/hr over 30 Minutes Intravenous On call to O.R. 03/27/13 1422 03/28/13 1032    .  Patient underwent the above named procedure and tolerated it well. The following day they were hemodynamically stable and pain was controlled on oral analgesics. They were neurovascularly intact to the operative extremity. He was kept overnight due to his sleep apnea for which he did not use CPAP . The following am  he was breathing well without difficulty and ready for DC home  DIAGNOSTIC STUDIES:  RECENT RADIOGRAPHIC STUDIES :  No results found.  RECENT VITAL SIGNS:  Patient Vitals for the past 24 hrs:  BP Temp Temp src Pulse Resp SpO2  03/29/13 0759 113/75 mmHg 98.4 F (36.9 C) - 61 16 96 %  03/29/13 0327 86/50 mmHg 98.3 F (36.8 C) Oral 72 16 95 %  03/28/13 2329 89/55 mmHg 99.4 F (37.4 C) Oral 71 16 96 %  03/28/13 1947 95/64 mmHg 99.1 F (37.3 C) Oral 70 16 95 %  03/28/13 1622 113/65 mmHg 97.8 F (36.6 C) - 66 16 96 %  03/28/13 1549 - - - 63 - 99 %  03/28/13 1545 - - - 62 13 99 %  03/28/13 1542 126/62 mmHg 98.2 F (36.8 C) - 63 14 100 %  03/28/13 1530 106/54 mmHg - - 67 15 99 %  03/28/13 1515 115/52 mmHg - - 65 17 100 %  03/28/13 1500 109/56 mmHg - - 66 18 100 %  03/28/13 1445 128/64 mmHg - - 75 19 100 %  03/28/13 1430 91/58 mmHg - - 62 14 100 %  03/28/13 1415 118/69 mmHg - - 58 14 100 %  03/28/13 1400 118/64 mmHg - - 54 17 97 %  03/28/13 1345 113/68  mmHg - - 63 14 100 %  03/28/13 1330 121/62 mmHg - - 63 11 100 %  03/28/13 1315 119/62 mmHg 97.1 F (36.2 C) - 71 16 100 %  03/28/13 1300 122/69 mmHg - - 67 16 100 %  03/28/13 1245 130/75 mmHg - - 56 12 100 %  03/28/13 1230 131/70 mmHg - - 58 14 100 %  03/28/13 1215 125/66 mmHg - - 71 17 100 %  03/28/13 1200 - - - 78 12 98 %  03/28/13 1157 135/106 mmHg 97.4 F (36.3 C) - 80 14 97 %  03/28/13 0940 - - - 73 15 97 %  03/28/13 0930 - - - 73 25 96 %  03/28/13 0925 - - - 70 19 99 %  03/28/13 0920 122/52 mmHg - - 75 17 98 %  03/28/13 0915 - - - 72 21 96 %  03/28/13 0905 119/48 mmHg - - 70 14 99 %  03/28/13 0845 - - - 68 16 100 %  .  RECENT EKG RESULTS:    Orders placed in visit on 01/21/13  . EKG 12-LEAD    DISCHARGE INSTRUCTIONS:   Future Appointments Provider Department Dept Phone   08/13/2013 9:00 AM Candee Furbish, MD [facility] [phone]      DISCHARGE MEDICATIONS:     Medication List          aspirin EC 81 MG tablet  Take 81 mg by mouth at bedtime.     atorvastatin 80 MG tablet  Commonly known as:  LIPITOR  Take 1 tablet (80 mg total) by mouth daily.     clopidogrel 75 MG tablet  Commonly known as:  PLAVIX  Take 1 tablet (75 mg total) by mouth at bedtime.     diazepam 5 MG tablet  Commonly known as:  VALIUM  Take 0.5-1 tablets (2.5-5 mg total) by mouth every 6 (six) hours as needed for muscle spasms or sedation.     fenofibrate 160 MG tablet  Take 160 mg by mouth at bedtime.     niacin 750 MG CR tablet  Commonly known as:  NIASPAN  Take 1,500 mg by mouth at bedtime.     nitroGLYCERIN 0.4 MG SL tablet  Commonly known as:  NITROSTAT  Place 0.4 mg under the tongue every 5 (five) minutes as needed. For chest pain     oxyCODONE-acetaminophen 5-325 MG per tablet  Commonly known as:  PERCOCET  Take 1-2 tablets by mouth every 4 (four) hours as needed.     pantoprazole 40 MG tablet  Commonly known as:  PROTONIX  Take 40 mg by mouth at bedtime.        FOLLOW UP VISIT:       Follow-up Information   Follow up with Marin Shutter, MD. (call to be seen in 7-10 days)    Specialty:  Orthopedic Surgery   Contact information:   [address] [place] [place] [address] [phone]       DISCHARGE TO: Home  DISPOSITION: Good  DISCHARGE CONDITION:  Festus Barren for Dr. [name] 03/29/2013, 8:44 AM

## 2013-04-01 ENCOUNTER — Encounter (HOSPITAL_COMMUNITY): Payer: Self-pay | Admitting: Orthopedic Surgery

## 2013-04-05 DIAGNOSIS — M25519 Pain in unspecified shoulder: Secondary | ICD-10-CM | POA: Diagnosis not present

## 2013-04-24 ENCOUNTER — Telehealth: Payer: Self-pay | Admitting: Cardiology

## 2013-04-24 NOTE — Telephone Encounter (Signed)
New message     Pt having colonoscopy and endoscopy---can he stop his plavix starting tomorrow for a 3-12 procedure?  Fax  Clearance to [phone]

## 2013-04-25 NOTE — Telephone Encounter (Signed)
Follow up    Ray Brown calling back to speak with nurse. patient need to stop plavix today.

## 2013-04-26 ENCOUNTER — Telehealth: Payer: Self-pay | Admitting: Cardiology

## 2013-04-26 NOTE — Telephone Encounter (Signed)
To Dr. Skains, please advise. 

## 2013-04-26 NOTE — Telephone Encounter (Signed)
Follow up     [facility] still has not received the clearance.  They now have passed the time limit to stop the plavix.  Is it too late to stop plavix and have colonoscopy?

## 2013-04-26 NOTE — Telephone Encounter (Signed)
Surgical Clearance

## 2013-04-26 NOTE — Telephone Encounter (Signed)
Received request from Nurse fax box, documents faxed for surgical clearance. To: [facility] Fax number: [phone] Attention: [phone]/KJefferies

## 2013-04-26 NOTE — Telephone Encounter (Signed)
Clearance faxed to [facility].

## 2013-05-02 ENCOUNTER — Other Ambulatory Visit: Payer: Self-pay | Admitting: Gastroenterology

## 2013-05-14 ENCOUNTER — Encounter: Payer: 59 | Attending: Family Medicine

## 2013-05-14 VITALS — Ht 68.0 in | Wt 203.8 lb

## 2013-05-14 DIAGNOSIS — Z713 Dietary counseling and surveillance: Secondary | ICD-10-CM | POA: Insufficient documentation

## 2013-05-14 DIAGNOSIS — E119 Type 2 diabetes mellitus without complications: Secondary | ICD-10-CM | POA: Insufficient documentation

## 2013-05-15 NOTE — Progress Notes (Signed)
Patient was seen on 05/14/13 for the first of a series of three diabetes self-management courses at the [facility].  Current HbA1c: 6.5%  The following learning objectives were met by the patient during this class:  Describe diabetes  State some common risk factors for diabetes  Defines the role of glucose and insulin  Identifies type of diabetes and pathophysiology  Describe the relationship between diabetes and cardiovascular risk  State the members of the Healthcare Team  States the rationale for glucose monitoring  State when to test glucose  State their individual Target Range  State the importance of logging glucose readings  Describe how to interpret glucose readings  Identifies A1C target  Explain the correlation between A1c and eAG values  State symptoms and treatment of high blood glucose  State symptoms and treatment of low blood glucose  Explain proper technique for glucose testing  Identifies proper sharps disposal  Handouts given during class include:  Living Well with Diabetes book  Carb Counting and Meal Planning book  Meal Plan Card  Carbohydrate guide  Meal planning worksheet  Low Sodium Flavoring Tips  The diabetes portion plate  G9E to eAG Conversion Chart  Diabetes Medications  Diabetes Recommended Care Schedule  Support Group  Diabetes Success Plan  Core Class Satisfaction Survey  Follow-Up Plan:  Attend core 2

## 2013-05-21 DIAGNOSIS — E119 Type 2 diabetes mellitus without complications: Secondary | ICD-10-CM

## 2013-05-22 NOTE — Progress Notes (Signed)

## 2013-05-28 ENCOUNTER — Encounter: Payer: 59 | Attending: Family Medicine

## 2013-05-28 DIAGNOSIS — Z713 Dietary counseling and surveillance: Secondary | ICD-10-CM | POA: Insufficient documentation

## 2013-05-28 DIAGNOSIS — IMO0001 Reserved for inherently not codable concepts without codable children: Secondary | ICD-10-CM

## 2013-05-28 DIAGNOSIS — E119 Type 2 diabetes mellitus without complications: Secondary | ICD-10-CM | POA: Insufficient documentation

## 2013-05-28 DIAGNOSIS — E1165 Type 2 diabetes mellitus with hyperglycemia: Secondary | ICD-10-CM

## 2013-05-28 NOTE — Progress Notes (Signed)
Patient was seen on 05/28/13 for the third of a series of three diabetes self-management courses at the [facility]. The following learning objectives were met by the patient during this class:    State the amount of activity recommended for healthy living   Describe activities suitable for individual needs   Identify ways to regularly incorporate activity into daily life   Identify barriers to activity and ways to over come these barriers  Identify diabetes medications being personally used and their primary action for lowering glucose and possible side effects   Describe role of stress on blood glucose and develop strategies to address psychosocial issues   Identify diabetes complications and ways to prevent them  Explain how to manage diabetes during illness   Evaluate success in meeting personal goal   Establish 2-3 goals that they will plan to diligently work on until they return for the  59-monthfollow-up visit  Goals:  Follow Diabetes Meal Plan as instructed  Aim for 15-30 mins of physical activity daily as tolerated  Bring food record and glucose log to your follow up visit  Your patient has established the following 4 month goals in their individualized success plan: I will count my carb choices at most meals and snacks I will increase my activity level at least 3 days a week  Your patient has identified these potential barriers to change:  None stated  Your patient has identified their diabetes self-care support plan as  [facility]  My wife  Plan:  Attend Core 4 in 4 months

## 2013-06-25 ENCOUNTER — Encounter: Payer: Self-pay | Admitting: Cardiology

## 2013-06-28 ENCOUNTER — Other Ambulatory Visit: Payer: Self-pay | Admitting: Cardiology

## 2013-08-13 ENCOUNTER — Ambulatory Visit: Payer: 59 | Admitting: Cardiology

## 2013-09-05 ENCOUNTER — Ambulatory Visit: Payer: 59 | Admitting: Cardiology

## 2013-09-26 ENCOUNTER — Encounter: Payer: Self-pay | Admitting: Cardiology

## 2013-09-26 ENCOUNTER — Other Ambulatory Visit: Payer: Self-pay | Admitting: Cardiology

## 2013-10-04 ENCOUNTER — Encounter: Payer: Self-pay | Admitting: Cardiology

## 2013-10-04 ENCOUNTER — Ambulatory Visit (INDEPENDENT_AMBULATORY_CARE_PROVIDER_SITE_OTHER): Payer: 59 | Admitting: Cardiology

## 2013-10-04 VITALS — BP 130/82 | HR 69 | Ht 68.0 in | Wt 202.0 lb

## 2013-10-04 DIAGNOSIS — E669 Obesity, unspecified: Secondary | ICD-10-CM

## 2013-10-04 DIAGNOSIS — E7800 Pure hypercholesterolemia, unspecified: Secondary | ICD-10-CM

## 2013-10-04 DIAGNOSIS — I798 Other disorders of arteries, arterioles and capillaries in diseases classified elsewhere: Secondary | ICD-10-CM

## 2013-10-04 DIAGNOSIS — I2510 Atherosclerotic heart disease of native coronary artery without angina pectoris: Secondary | ICD-10-CM | POA: Insufficient documentation

## 2013-10-04 DIAGNOSIS — M25519 Pain in unspecified shoulder: Secondary | ICD-10-CM

## 2013-10-04 DIAGNOSIS — E1159 Type 2 diabetes mellitus with other circulatory complications: Secondary | ICD-10-CM

## 2013-10-04 DIAGNOSIS — E1151 Type 2 diabetes mellitus with diabetic peripheral angiopathy without gangrene: Secondary | ICD-10-CM | POA: Insufficient documentation

## 2013-10-04 NOTE — Patient Instructions (Signed)
The current medical regimen is effective;  continue present plan and medications.  Follow up in 6 months with Dr. Skains.  You will receive a letter in the mail 2 months before you are due.  Please call us when you receive this letter to schedule your follow up appointment.  

## 2013-10-04 NOTE — Progress Notes (Signed)
[place]. [address], [address], [place]  [address] Phone: [phone] Fax:  [phone]  Date:  10/04/2013   ID:  KENZEL RUESCH, DOB 02-Oct-1950, MRN [id number]  PCP:  [name], MD   History of Present Illness: Ray Brown is a 63 y.o. male with coronary artery disease drug eluting stent in July 2010, bare-metal stent and RCA August 08 (had in-stent restenosis) with hypertension, hyperlipidemia, aortobifemoral bypass in 2003, sleep apnea not on CPAP here he here for followup.  10/2010-cardiac catheterization showing patent RCA stents after nuclear stress test showed inferior wall ischemia. Overall reassuring.  Prior right shoulder surgery surgery risk stratification. Had prior left shoulder surgery. No CP no SOB. Shoulder pain. No claudication. Hip pain, cortisone. Hip pain is lateral.   Wt Readings from Last 3 Encounters:  10/04/13 202 lb (91.627 kg)  05/15/13 203 lb 12.8 oz (92.443 kg)  02/26/13 209 lb 1.6 oz (94.847 kg)     Past Medical History  Diagnosis Date  . Coronary artery disease   . GERD (gastroesophageal reflux disease)   . Bronchitis   . H/O hiatal hernia   . Arthritis   . Hypercholesteremia   . Dry skin     in the back of head-left side  . Chronic back pain   . Complication of anesthesia     difficulty breathing s/p back surgery at [facility] 2013  . Pneumonia   . Shortness of breath   . Sleep apnea     has CPAP but does not use  . Peripheral vascular disease   . Diabetes mellitus without complication     Past Surgical History  Procedure Laterality Date  . Foot surgery  1980s    right foot  . Back surgery  2004  . Aortic bypass  2003    aortobifemoral bypass  . Cardiac catheterization  2010    x 2 stents RCA  . Shoulder surgery      left shoulder rotator cuff repair  . Colonoscopy  08/04/2009  . Coronary angioplasty      2 stents  . Upper gi endoscopy    . Shoulder arthroscopy with subacromial decompression Right 03/28/2013   Procedure: RIGHT SHOULDER ARTHROSCOPY WITH SUBACROMIAL DECOMPRESSION AND DISTAL CLAVICLE RESECTION;  Surgeon: Marin Shutter, MD;  Location: [place];  Service: Orthopedics;  Laterality: Right;    Current Outpatient Prescriptions  Medication Sig Dispense Refill  . aspirin EC 81 MG tablet Take 81 mg by mouth at bedtime.      Marland Kitchen atorvastatin (LIPITOR) 80 MG tablet Take 1 tablet by mouth  daily  90 tablet  0  . clopidogrel (PLAVIX) 75 MG tablet Take 1 tablet (75 mg total) by mouth at bedtime.  90 tablet  3  . diazepam (VALIUM) 5 MG tablet Take 0.5-1 tablets (2.5-5 mg total) by mouth every 6 (six) hours as needed for muscle spasms or sedation.  40 tablet  1  . fenofibrate 160 MG tablet Take 160 mg by mouth at bedtime.      . niacin (NIASPAN) 750 MG CR tablet Take 1,500 mg by mouth at bedtime.      . nitroGLYCERIN (NITROSTAT) 0.4 MG SL tablet Place 0.4 mg under the tongue every 5 (five) minutes as needed. For chest pain      . pantoprazole (PROTONIX) 40 MG tablet Take 40 mg by mouth at bedtime.      . traMADol (ULTRAM) 50 MG tablet  No current facility-administered medications for this visit.    Allergies:    Allergies  Allergen Reactions  . Ciprofloxacin Rash  . Daypro [Oxaprozin] Rash    Social History:  The patient  reports that he has quit smoking. His smoking use included Cigarettes. He smoked 0.00 packs per day for 35 years. He does not have any smokeless tobacco history on file. He reports that he does not drink alcohol or use illicit drugs.   ROS:  Please see the history of present illness.   Denies any bleeding, syncope, orthopnea, PND    PHYSICAL EXAM: VS:  BP 130/82  Pulse 69  Ht 5\' 8"  (1.727 m)  Wt 202 lb (91.627 kg)  BMI 30.72 kg/m2 Well nourished, well developed, in no acute distress HEENT: normal Neck: no JVD Cardiac:  normal S1, S2; RRR; no murmur Lungs:  clear to auscultation bilaterally, no wheezing, rhonchi or rales Abd: soft, nontender, no hepatomegaly Ext:  no edemaPalpable distal pulses, left arm excoriations from dog Skin: warm and dry Neuro: no focal abnormalities noted  EKG: 10/04/13: Sinus rhythm rate 69 with no other significant abnormalities.   No change from prior.   ASSESSMENT AND PLAN:  1. Shoudler pain. Last cardiac catheterization in 2012 showed patent right coronary artery stents. Overall he is having no active anginal symptoms. No arrhythmias. No signs of heart failure. Dr. Lennette Bihari Supple shoulder surgery. Still with pain.  2. Peripheral vascular disease-currently asymptomatic. No claudication. This is why he is on both Plavix as well as aspirin. Continue with exercise. 3. Obesity-BMI is currently greater than 30. Encourage weight loss. 4. Hyperlipidemia-last LDL was 43. Continue with current regimen. 5. Diabetes - Hg 6.6 down to 6. Diet control.  6. We will see him back in approximately 6 months.  Signed, Candee Furbish, MD [facility]  10/04/2013 8:08 AM

## 2013-11-29 ENCOUNTER — Other Ambulatory Visit (HOSPITAL_COMMUNITY): Payer: Self-pay | Admitting: Family Medicine

## 2013-11-29 DIAGNOSIS — R112 Nausea with vomiting, unspecified: Secondary | ICD-10-CM

## 2013-12-09 ENCOUNTER — Ambulatory Visit (HOSPITAL_COMMUNITY)
Admission: RE | Admit: 2013-12-09 | Discharge: 2013-12-09 | Disposition: A | Discharge status: Home / Self Care | Payer: 59 | Source: Ambulatory Visit | Attending: Family Medicine | Admitting: Family Medicine

## 2013-12-09 DIAGNOSIS — E119 Type 2 diabetes mellitus without complications: Secondary | ICD-10-CM | POA: Diagnosis not present

## 2013-12-09 DIAGNOSIS — R112 Nausea with vomiting, unspecified: Secondary | ICD-10-CM | POA: Insufficient documentation

## 2013-12-25 ENCOUNTER — Ambulatory Visit
Admission: RE | Admit: 2013-12-25 | Discharge: 2013-12-25 | Disposition: A | Discharge status: Home / Self Care | Payer: 59 | Source: Ambulatory Visit | Attending: Family Medicine | Admitting: Family Medicine

## 2013-12-25 ENCOUNTER — Other Ambulatory Visit: Payer: Self-pay | Admitting: Family Medicine

## 2013-12-25 DIAGNOSIS — R059 Cough, unspecified: Secondary | ICD-10-CM

## 2013-12-25 DIAGNOSIS — R05 Cough: Secondary | ICD-10-CM

## 2014-01-08 ENCOUNTER — Other Ambulatory Visit: Payer: Self-pay | Admitting: Cardiology

## 2014-04-16 ENCOUNTER — Ambulatory Visit: Payer: Medicare Other | Admitting: Cardiology

## 2014-05-06 ENCOUNTER — Ambulatory Visit (INDEPENDENT_AMBULATORY_CARE_PROVIDER_SITE_OTHER): Payer: 59 | Admitting: Cardiology

## 2014-05-06 ENCOUNTER — Encounter: Payer: Self-pay | Admitting: Cardiology

## 2014-05-06 VITALS — BP 143/74 | HR 78 | Ht 68.0 in | Wt 203.0 lb

## 2014-05-06 DIAGNOSIS — E669 Obesity, unspecified: Secondary | ICD-10-CM

## 2014-05-06 DIAGNOSIS — E785 Hyperlipidemia, unspecified: Secondary | ICD-10-CM

## 2014-05-06 DIAGNOSIS — I739 Peripheral vascular disease, unspecified: Secondary | ICD-10-CM

## 2014-05-06 DIAGNOSIS — I2510 Atherosclerotic heart disease of native coronary artery without angina pectoris: Secondary | ICD-10-CM | POA: Diagnosis not present

## 2014-05-06 MED ORDER — ATORVASTATIN CALCIUM 40 MG PO TABS: 40.0000 mg | ORAL_TABLET | Freq: Every day | ORAL | Status: DC

## 2014-05-06 NOTE — Patient Instructions (Signed)
Please decrease your atorvastatin to 40 mg a day. Continue all other medications as listed.  Follow up in 6 months with Dr. [name].  You will receive a letter in the mail 2 months before you are due.  Please call us when you receive this letter to schedule your follow up appointment.  Thank you for choosing [facility]!!

## 2014-05-06 NOTE — Progress Notes (Signed)
[place]. [address] [place], [place]  [address] Phone: [phone] Fax:  [phone]  Date:  05/06/2014   ID:  KOWEN KLUTH, DOB 28-Aug-1950, MRN [id number]  PCP:  [name], MD   History of Present Illness: Ray Brown is a 64 y.o. male with coronary artery disease drug eluting stent in July 2010, bare-metal stent and RCA August 08 (had in-stent restenosis) with hypertension, hyperlipidemia, aortobifemoral bypass in 2003, sleep apnea not on CPAP here he here for followup.  10/2010-cardiac catheterization showing patent RCA stents after nuclear stress test showed inferior wall ischemia. Overall reassuring.  Prior right shoulder surgery surgery risk stratification. Had prior left shoulder surgery. No CP no SOB. Shoulder pain. No claudication. +Hip pain, cortisone. Hip pain is lateral.   Right hip pain, hard to get up in the morning. Both hips hurt with exertion. Tramadol does not seem to help.   Hyperlipidemia-excellently controlled.  Wt Readings from Last 3 Encounters:  05/06/14 203 lb (92.08 kg)  10/04/13 202 lb (91.627 kg)  05/15/13 203 lb 12.8 oz (92.443 kg)     Past Medical History  Diagnosis Date  . Coronary artery disease   . GERD (gastroesophageal reflux disease)   . Bronchitis   . H/O hiatal hernia   . Arthritis   . Hypercholesteremia   . Dry skin     in the back of head-left side  . Chronic back pain   . Complication of anesthesia     difficulty breathing s/p back surgery at [facility] 2013  . Pneumonia   . Shortness of breath   . Sleep apnea     has CPAP but does not use  . Peripheral vascular disease   . Diabetes mellitus without complication     Past Surgical History  Procedure Laterality Date  . Foot surgery  1980s    right foot  . Back surgery  2004  . Aortic bypass  2003    aortobifemoral bypass  . Cardiac catheterization  2010    x 2 stents RCA  . Shoulder surgery      left shoulder rotator cuff repair  . Colonoscopy   08/04/2009  . Coronary angioplasty      2 stents  . Upper gi endoscopy    . Shoulder arthroscopy with subacromial decompression Right 03/28/2013    Procedure: RIGHT SHOULDER ARTHROSCOPY WITH SUBACROMIAL DECOMPRESSION AND DISTAL CLAVICLE RESECTION;  Surgeon: Marin Shutter, MD;  Location: [place];  Service: Orthopedics;  Laterality: Right;    Current Outpatient Prescriptions  Medication Sig Dispense Refill  . aspirin EC 81 MG tablet Take 81 mg by mouth at bedtime.    Marland Kitchen atorvastatin (LIPITOR) 80 MG tablet Take 1 tablet by mouth  daily 90 tablet 1  . clopidogrel (PLAVIX) 75 MG tablet Take 1 tablet (75 mg total) by mouth at bedtime. 90 tablet 1  . fenofibrate 160 MG tablet Take 160 mg by mouth at bedtime.    . niacin (NIASPAN) 750 MG CR tablet Take 1,500 mg by mouth at bedtime.    . pantoprazole (PROTONIX) 40 MG tablet Take 40 mg by mouth at bedtime.    . nitroGLYCERIN (NITROSTAT) 0.4 MG SL tablet Place 0.4 mg under the tongue every 5 (five) minutes as needed. For chest pain    . traMADol (ULTRAM) 50 MG tablet      No current facility-administered medications for this visit.    Allergies:    Allergies  Allergen Reactions  .  Ciprofloxacin Rash  . Daypro [Oxaprozin] Rash    Social History:  The patient  reports that he has quit smoking. His smoking use included Cigarettes. He quit after 35 years of use. He does not have any smokeless tobacco history on file. He reports that he does not drink alcohol or use illicit drugs.   ROS:  Please see the history of present illness.   Denies any bleeding, syncope, orthopnea, PND    PHYSICAL EXAM: VS:  BP 143/74 mmHg  Pulse 78  Ht 5\' 8"  (1.727 m)  Wt 203 lb (92.08 kg)  BMI 30.87 kg/m2 Well nourished, well developed, in no acute distress HEENT: normal Neck: no JVD Cardiac:  normal S1, S2; RRR; no murmur Lungs:  clear to auscultation bilaterally, no wheezing, rhonchi or rales Abd: soft, nontender, no hepatomegaly Ext: no edemaPalpable distal  pulses Skin: warm and dry Neuro: no focal abnormalities noted  EKG: 10/04/13: Sinus rhythm rate 69 with no other significant abnormalities.   No change from prior.   ASSESSMENT AND PLAN:  1. Shoudler pain. Last cardiac catheterization in 2012 showed patent right coronary artery stents. Overall he is having no active anginal symptoms. No arrhythmias. No signs of heart failure. Dr. Lennette Bihari Supple shoulder surgery. Still with pain.  2. Peripheral vascular disease-currently asymptomatic. No claudication. This is why he is on both Plavix as well as aspirin. Continue with exercise. 3. Obesity-BMI is currently greater than 30. Encourage weight loss. This will help with borderline diabetes as well. 4. Hyperlipidemia-last LDL was 43. I would be fine with him decreasing his atorvastatin to 40 mg from 80 mg. Dr. Brigitte Pulse has been monitoring. 5. Diabetes - Hg 6.6 down to 6. Diet control.  6. We will see him back in approximately 6 months.  Signed, Candee Furbish, MD [facility]  05/06/2014 8:40 AM

## 2014-06-04 ENCOUNTER — Other Ambulatory Visit: Payer: Self-pay | Admitting: Cardiology

## 2014-09-30 ENCOUNTER — Encounter: Payer: Self-pay | Admitting: Cardiology

## 2014-10-02 ENCOUNTER — Other Ambulatory Visit: Payer: Self-pay | Admitting: *Deleted

## 2014-10-02 MED ORDER — ATORVASTATIN CALCIUM 80 MG PO TABS: 80.0000 mg | ORAL_TABLET | Freq: Every day | ORAL | Status: DC

## 2014-11-13 ENCOUNTER — Ambulatory Visit (INDEPENDENT_AMBULATORY_CARE_PROVIDER_SITE_OTHER): Payer: Commercial Managed Care - HMO | Admitting: Cardiology

## 2014-11-13 ENCOUNTER — Encounter: Payer: Self-pay | Admitting: Cardiology

## 2014-11-13 VITALS — BP 126/80 | HR 59 | Ht 68.0 in | Wt 206.8 lb

## 2014-11-13 DIAGNOSIS — I2510 Atherosclerotic heart disease of native coronary artery without angina pectoris: Secondary | ICD-10-CM

## 2014-11-13 DIAGNOSIS — I739 Peripheral vascular disease, unspecified: Secondary | ICD-10-CM

## 2014-11-13 DIAGNOSIS — E785 Hyperlipidemia, unspecified: Secondary | ICD-10-CM

## 2014-11-13 NOTE — Progress Notes (Signed)
[place]. [address] [place], [place]  [address] Phone: [phone] Fax:  [phone]  Date:  11/13/2014   ID:  Ray Brown, DOB [birth year], MRN [id number]  PCP:  [name], MD   History of Present Illness: Ray Brown is a 64 y.o. male with coronary artery disease drug eluting stent in July 2010, bare-metal stent and RCA August 08 (had in-stent restenosis) with hypertension, hyperlipidemia, aortobifemoral bypass in 2001/06/21, sleep apnea not on CPAP here he here for followup.  10/2010-cardiac catheterization showing patent RCA stents after nuclear stress test showed inferior wall ischemia. Overall reassuring.  Prior right shoulder surgery surgery risk stratification. Had prior left shoulder surgery. No CP no SOB. Shoulder pain. No claudication. +Hip pain, cortisone. Hip pain is lateral.   Right hip pain, hard to get up in the morning. Both hips hurt with exertion. Tramadol does not seem to help.   Hyperlipidemia-excellently controlled. See below  Mother died after surgery. Jun 22, 2014  If walking hip pain, back pain.   Right iliac stent (the way he describes his procedure) as well as aortobifemoral bypass. Dr. Donnetta Hutching.   Wt Readings from Last 3 Encounters:  11/13/14 206 lb 12.8 oz (93.804 kg)  05/06/14 203 lb (92.08 kg)  10/04/13 202 lb (91.627 kg)     Past Medical History  Diagnosis Date  . Coronary artery disease   . GERD (gastroesophageal reflux disease)   . Bronchitis   . H/O hiatal hernia   . Arthritis   . Hypercholesteremia   . Dry skin     in the back of head-left side  . Chronic back pain   . Complication of anesthesia     difficulty breathing s/p back surgery [facility] 06-22-2011  . Pneumonia   . Shortness of breath   . Sleep apnea     has CPAP but does not use  . Peripheral vascular disease   . Diabetes mellitus without complication     Past Surgical History  Procedure Laterality Date  . Foot surgery  1980s    right foot  . Back surgery  Jun 22, 2002    . Aortic bypass  06/21/01    aortobifemoral bypass  . Cardiac catheterization  21-Jun-2008    x 2 stents RCA  . Shoulder surgery      left shoulder rotator cuff repair  . Colonoscopy  08/04/2009  . Coronary angioplasty      2 stents  . Upper gi endoscopy    . Shoulder arthroscopy with subacromial decompression Right 03/28/2013    Procedure: RIGHT SHOULDER ARTHROSCOPY WITH SUBACROMIAL DECOMPRESSION AND DISTAL CLAVICLE RESECTION;  Surgeon: Marin Shutter, MD;  Location: [place];  Service: Orthopedics;  Laterality: Right;    Current Outpatient Prescriptions  Medication Sig Dispense Refill  . aspirin EC 81 MG tablet Take 81 mg by mouth at bedtime.    Marland Kitchen atorvastatin (LIPITOR) 80 MG tablet Take 1 tablet (80 mg total) by mouth daily at 6 PM. 90 tablet 3  . clopidogrel (PLAVIX) 75 MG tablet Take 1 tablet by mouth at  bedtime 90 tablet 2  . fenofibrate 160 MG tablet Take 160 mg by mouth at bedtime.    . niacin (NIASPAN) 750 MG CR tablet Take 1,500 mg by mouth at bedtime.    . nitroGLYCERIN (NITROSTAT) 0.4 MG SL tablet Place 0.4 mg under the tongue every 5 (five) minutes as needed. For chest pain    . pantoprazole (PROTONIX) 40 MG tablet Take  40 mg by mouth at bedtime.    . traMADol (ULTRAM-ER) 100 MG 24 hr tablet TAKE 1 TABLET EVERY DAY AS NEEDED  0   No current facility-administered medications for this visit.    Allergies:    Allergies  Allergen Reactions  . Ciprofloxacin Rash  . Daypro [Oxaprozin] Rash    Social History:  The patient  reports that he has quit smoking. His smoking use included Cigarettes. He quit after 35 years of use. He does not have any smokeless tobacco history on file. He reports that he does not drink alcohol or use illicit drugs.   ROS:  Please see the history of present illness.   Denies any bleeding, syncope, orthopnea, PND    PHYSICAL EXAM: VS:  BP 126/80 mmHg  Pulse 59  Ht 5\' 8"  (1.727 m)  Wt 206 lb 12.8 oz (93.804 kg)  BMI 31.45 kg/m2 Well nourished, well  developed, in no acute distress HEENT: normal Neck: no JVD Cardiac:  normal S1, S2; RRR; no murmur Lungs:  clear to auscultation bilaterally, no wheezing, rhonchi or rales Abd: soft, nontender, no hepatomegalyOverweight Ext: no edemaPalpable distal pulses bilaterally dorsalis pedis Skin: warm and dry Neuro: no focal abnormalities noted  EKG: Today-11/13/14-sinus rhythm, no other abnormalities, 77 bpm. Personally viewed-prior 10/04/13: Sinus rhythm rate 69 with no other significant abnormalities.   No change from prior.   ASSESSMENT AND PLAN:  1. Coronary artery disease-Last cardiac catheterization in 2012 showed patent right coronary artery stents. Overall he is having no active anginal symptoms. No arrhythmias. No signs of heart failure. Dr. Lennette Bihari Supple shoulder surgery. Still with pain.  2. Peripheral vascular disease-he is concerned that his symptoms while walking, bilateral hip pain, back pain may be related to his prior peripheral vascular disease. It has been a while since he is seen Dr. Donnetta Hutching. I will refer him back to Dr. early further evaluation. This is why he is on both Plavix as well as aspirin. Continue with exercise. 3. Obesity-BMI is currently greater than 30. Encourage weight loss. This will help with borderline diabetes as well. 4. Hyperlipidemia-previously, we try to decrease his atorvastatin to 40 mg however his LDL increased greater than 70. We are continuing with the 80 mg atorvastatin. We also discussed changing his Niaspan, fibrate. He has been on these for years and at one point he struggles rise were greater than 500. His mother also had hypertriglyceridemia. He would rather keep his current medication regimen. Seems reasonable. Discussed lack of MI reduction with those other auxiliary medications. Dr. Brigitte Pulse has been monitoring. 5. Diabetes - Hg 6.6 down to 6. Diet control.  6. We will see him back in approximately 6 months.  Signed, Candee Furbish, MD [facility]  11/13/2014 8:53  AM

## 2014-11-13 NOTE — Patient Instructions (Signed)
Medication Instructions:  Your physician recommends that you continue on your current medications as directed. Please refer to the Current Medication list given to you today.  Labwork: NONE  Testing/Procedures: NONE  Follow-Up: Your physician wants you to follow-up in: [place] will receive a reminder letter in the mail two months in advance. If you don't receive a letter, please call our office to schedule the follow-up appointment.  Any Other Special Instructions Will Be Listed Below (If Applicable). Will arrange for you to see Dr Donnetta Hutching, if you do not hear from that office please call them directly at [phone]

## 2014-11-27 ENCOUNTER — Other Ambulatory Visit: Payer: Self-pay | Admitting: *Deleted

## 2014-11-27 DIAGNOSIS — I739 Peripheral vascular disease, unspecified: Secondary | ICD-10-CM

## 2014-12-05 ENCOUNTER — Telehealth: Payer: Self-pay | Admitting: Cardiology

## 2014-12-05 ENCOUNTER — Encounter (HOSPITAL_COMMUNITY): Payer: Self-pay | Admitting: Nurse Practitioner

## 2014-12-05 ENCOUNTER — Emergency Department (HOSPITAL_COMMUNITY): Payer: Commercial Managed Care - HMO

## 2014-12-05 ENCOUNTER — Inpatient Hospital Stay (HOSPITAL_COMMUNITY)
Admission: EM | Admit: 2014-12-05 | Discharge: 2014-12-08 | DRG: 287 | Disposition: A | Discharge status: Home / Self Care | Payer: Commercial Managed Care - HMO | Attending: Cardiovascular Disease | Admitting: Cardiovascular Disease

## 2014-12-05 DIAGNOSIS — I200 Unstable angina: Secondary | ICD-10-CM | POA: Diagnosis not present

## 2014-12-05 DIAGNOSIS — E785 Hyperlipidemia, unspecified: Secondary | ICD-10-CM | POA: Diagnosis present

## 2014-12-05 DIAGNOSIS — E1151 Type 2 diabetes mellitus with diabetic peripheral angiopathy without gangrene: Secondary | ICD-10-CM | POA: Diagnosis present

## 2014-12-05 DIAGNOSIS — Z7982 Long term (current) use of aspirin: Secondary | ICD-10-CM | POA: Diagnosis not present

## 2014-12-05 DIAGNOSIS — G4733 Obstructive sleep apnea (adult) (pediatric): Secondary | ICD-10-CM | POA: Diagnosis present

## 2014-12-05 DIAGNOSIS — Z955 Presence of coronary angioplasty implant and graft: Secondary | ICD-10-CM | POA: Diagnosis not present

## 2014-12-05 DIAGNOSIS — I739 Peripheral vascular disease, unspecified: Secondary | ICD-10-CM | POA: Diagnosis not present

## 2014-12-05 DIAGNOSIS — Z881 Allergy status to other antibiotic agents status: Secondary | ICD-10-CM

## 2014-12-05 DIAGNOSIS — Z79899 Other long term (current) drug therapy: Secondary | ICD-10-CM | POA: Diagnosis not present

## 2014-12-05 DIAGNOSIS — Z7902 Long term (current) use of antithrombotics/antiplatelets: Secondary | ICD-10-CM | POA: Diagnosis not present

## 2014-12-05 DIAGNOSIS — R079 Chest pain, unspecified: Secondary | ICD-10-CM

## 2014-12-05 DIAGNOSIS — R0602 Shortness of breath: Secondary | ICD-10-CM | POA: Diagnosis not present

## 2014-12-05 DIAGNOSIS — I7101 Dissection of thoracic aorta: Secondary | ICD-10-CM | POA: Diagnosis not present

## 2014-12-05 DIAGNOSIS — R0600 Dyspnea, unspecified: Secondary | ICD-10-CM | POA: Diagnosis present

## 2014-12-05 DIAGNOSIS — Z87891 Personal history of nicotine dependence: Secondary | ICD-10-CM

## 2014-12-05 DIAGNOSIS — I10 Essential (primary) hypertension: Secondary | ICD-10-CM | POA: Diagnosis present

## 2014-12-05 DIAGNOSIS — Z8249 Family history of ischemic heart disease and other diseases of the circulatory system: Secondary | ICD-10-CM | POA: Diagnosis not present

## 2014-12-05 DIAGNOSIS — R42 Dizziness and giddiness: Secondary | ICD-10-CM | POA: Diagnosis not present

## 2014-12-05 DIAGNOSIS — Z23 Encounter for immunization: Secondary | ICD-10-CM

## 2014-12-05 DIAGNOSIS — K219 Gastro-esophageal reflux disease without esophagitis: Secondary | ICD-10-CM | POA: Diagnosis present

## 2014-12-05 DIAGNOSIS — Z886 Allergy status to analgesic agent status: Secondary | ICD-10-CM | POA: Diagnosis not present

## 2014-12-05 DIAGNOSIS — I2510 Atherosclerotic heart disease of native coronary artery without angina pectoris: Secondary | ICD-10-CM | POA: Diagnosis not present

## 2014-12-05 DIAGNOSIS — I25110 Atherosclerotic heart disease of native coronary artery with unstable angina pectoris: Secondary | ICD-10-CM | POA: Diagnosis not present

## 2014-12-05 HISTORY — DX: Unstable angina: I20.0

## 2014-12-05 LAB — MRSA PCR SCREENING: MRSA BY PCR: NEGATIVE

## 2014-12-05 LAB — BASIC METABOLIC PANEL
ANION GAP: 7 (ref 5–15)
BUN: 14 mg/dL (ref 6–20)
CHLORIDE: 104 mmol/L (ref 101–111)
CO2: 26 mmol/L (ref 22–32)
Calcium: 9.3 mg/dL (ref 8.9–10.3)
Creatinine, Ser: 0.88 mg/dL (ref 0.61–1.24)
Glucose, Bld: 110 mg/dL — ABNORMAL HIGH (ref 65–99)
POTASSIUM: 4.1 mmol/L (ref 3.5–5.1)
SODIUM: 137 mmol/L (ref 135–145)

## 2014-12-05 LAB — I-STAT TROPONIN, ED: TROPONIN I, POC: 0.01 ng/mL (ref 0.00–0.08)

## 2014-12-05 LAB — PROTIME-INR
INR: 1.01 (ref 0.00–1.49)
Prothrombin Time: 13.5 seconds (ref 11.6–15.2)

## 2014-12-05 LAB — CBC
HEMATOCRIT: 44.1 % (ref 39.0–52.0)
HEMOGLOBIN: 15.2 g/dL (ref 13.0–17.0)
MCH: 31.4 pg (ref 26.0–34.0)
MCHC: 34.5 g/dL (ref 30.0–36.0)
MCV: 91.1 fL (ref 78.0–100.0)
Platelets: 231 10*3/uL (ref 150–400)
RBC: 4.84 MIL/uL (ref 4.22–5.81)
RDW: 13.1 % (ref 11.5–15.5)
WBC: 11.1 10*3/uL — AB (ref 4.0–10.5)

## 2014-12-05 LAB — BRAIN NATRIURETIC PEPTIDE: B NATRIURETIC PEPTIDE 5: 15.7 pg/mL (ref 0.0–100.0)

## 2014-12-05 LAB — TROPONIN I: Troponin I: <0.03 ng/mL (ref <0.031)

## 2014-12-05 MED ORDER — CLOPIDOGREL BISULFATE 75 MG PO TABS
75.0000 mg | ORAL_TABLET | Freq: Once | ORAL | Status: AC
  Administered 2014-12-05: 75 mg via ORAL
  Filled 2014-12-05: qty 1

## 2014-12-05 MED ORDER — FENOFIBRATE 160 MG PO TABS
160.0000 mg | ORAL_TABLET | Freq: Every day | ORAL | Status: DC
  Administered 2014-12-05 – 2014-12-07 (×3): 160 mg via ORAL
  Filled 2014-12-05 (×3): qty 1

## 2014-12-05 MED ORDER — ONDANSETRON HCL 4 MG/2ML IJ SOLN: 4.0000 mg | Freq: Four times a day (QID) | INTRAMUSCULAR | Status: DC | PRN

## 2014-12-05 MED ORDER — NIACIN ER (ANTIHYPERLIPIDEMIC) 500 MG PO TBCR
1500.0000 mg | EXTENDED_RELEASE_TABLET | Freq: Every day | ORAL | Status: DC
  Administered 2014-12-05 – 2014-12-07 (×3): 1500 mg via ORAL
  Filled 2014-12-05 (×4): qty 3

## 2014-12-05 MED ORDER — INFLUENZA VAC SPLIT QUAD 0.5 ML IM SUSY: 0.5000 mL | PREFILLED_SYRINGE | INTRAMUSCULAR | Status: DC

## 2014-12-05 MED ORDER — ASPIRIN EC 81 MG PO TBEC
81.0000 mg | DELAYED_RELEASE_TABLET | Freq: Every day | ORAL | Status: DC
  Administered 2014-12-06 – 2014-12-07 (×2): 81 mg via ORAL
  Filled 2014-12-05 (×2): qty 1

## 2014-12-05 MED ORDER — NITROGLYCERIN 0.4 MG SL SUBL
SUBLINGUAL_TABLET | SUBLINGUAL | Status: AC
  Administered 2014-12-05: 0.4 mg via SUBLINGUAL
  Filled 2014-12-05: qty 1

## 2014-12-05 MED ORDER — INFLUENZA VAC SPLIT QUAD 0.5 ML IM SUSY
0.5000 mL | PREFILLED_SYRINGE | INTRAMUSCULAR | Status: AC
  Administered 2014-12-06: 0.5 mL via INTRAMUSCULAR
  Filled 2014-12-05: qty 0.5

## 2014-12-05 MED ORDER — METOPROLOL TARTRATE 12.5 MG HALF TABLET
12.5000 mg | ORAL_TABLET | Freq: Two times a day (BID) | ORAL | Status: DC
  Administered 2014-12-05 – 2014-12-08 (×6): 12.5 mg via ORAL
  Filled 2014-12-05 (×6): qty 1

## 2014-12-05 MED ORDER — NITROGLYCERIN IN D5W 200-5 MCG/ML-% IV SOLN: 3.0000 ug/min | INTRAVENOUS | Status: DC

## 2014-12-05 MED ORDER — NITROGLYCERIN IN D5W 200-5 MCG/ML-% IV SOLN
2.0000 ug/min | INTRAVENOUS | Status: DC
  Administered 2014-12-05: 5 ug/min via INTRAVENOUS
  Filled 2014-12-05: qty 250

## 2014-12-05 MED ORDER — ATORVASTATIN CALCIUM 80 MG PO TABS
80.0000 mg | ORAL_TABLET | Freq: Every day | ORAL | Status: DC
  Administered 2014-12-05 – 2014-12-07 (×3): 80 mg via ORAL
  Filled 2014-12-05 (×3): qty 1

## 2014-12-05 MED ORDER — PANTOPRAZOLE SODIUM 40 MG PO TBEC
40.0000 mg | DELAYED_RELEASE_TABLET | Freq: Every day | ORAL | Status: DC
  Administered 2014-12-05 – 2014-12-07 (×3): 40 mg via ORAL
  Filled 2014-12-05 (×4): qty 1

## 2014-12-05 MED ORDER — HEPARIN BOLUS VIA INFUSION
4000.0000 [IU] | Freq: Once | INTRAVENOUS | Status: AC
  Administered 2014-12-05: 4000 [IU] via INTRAVENOUS
  Filled 2014-12-05: qty 4000

## 2014-12-05 MED ORDER — ACETAMINOPHEN 325 MG PO TABS: 650.0000 mg | ORAL_TABLET | ORAL | Status: DC | PRN

## 2014-12-05 MED ORDER — HEPARIN (PORCINE) IN NACL 100-0.45 UNIT/ML-% IJ SOLN
1300.0000 [IU]/h | INTRAMUSCULAR | Status: DC
  Administered 2014-12-05: 1100 [IU]/h via INTRAVENOUS
  Administered 2014-12-06 – 2014-12-08 (×3): 1300 [IU]/h via INTRAVENOUS
  Filled 2014-12-05 (×5): qty 250

## 2014-12-05 MED ORDER — NITROGLYCERIN 0.4 MG SL SUBL
0.4000 mg | SUBLINGUAL_TABLET | SUBLINGUAL | Status: DC | PRN
  Administered 2014-12-05: 0.4 mg via SUBLINGUAL

## 2014-12-05 MED ORDER — ASPIRIN 325 MG PO TABS
325.0000 mg | ORAL_TABLET | Freq: Once | ORAL | Status: AC
  Administered 2014-12-05: 325 mg via ORAL
  Filled 2014-12-05: qty 1

## 2014-12-05 MED ORDER — ASPIRIN 81 MG PO CHEW
324.0000 mg | CHEWABLE_TABLET | Freq: Once | ORAL | Status: DC
Start: 2014-12-05 — End: 2014-12-05

## 2014-12-05 NOTE — Progress Notes (Signed)
ANTICOAGULATION CONSULT NOTE - Initial Consult  Pharmacy Consult for Heparin Indication: ACS / STEMI  Allergies  Allergen Reactions  . Ciprofloxacin Rash  . Daypro [Oxaprozin] Rash    Patient Measurements: TBW 94 kg in Sept. 2016 IBW 68.4 kg Heparin dosing weight: 88 kg  Vital Signs: Temp: 97.7 F (36.5 C) (10/14 1333) Temp Source: Oral (10/14 1333) BP: 139/70 mmHg (10/14 1630) Pulse Rate: 68 (10/14 1630)  Labs:  Recent Labs  12/05/14 1348  HGB 15.2  HCT 44.1  PLT 231  LABPROT 13.5  INR 1.01  CREATININE 0.88    CrCl cannot be calculated (Unknown ideal weight.).   Medical History: Past Medical History  Diagnosis Date  . Coronary artery disease   . GERD (gastroesophageal reflux disease)   . Bronchitis   . H/O hiatal hernia   . Arthritis   . Hypercholesteremia   . Dry skin     in the back of head-left side  . Chronic back pain   . Complication of anesthesia     difficulty breathing s/p back surgery at [facility] 2013  . Pneumonia   . Shortness of breath   . Sleep apnea     has CPAP but does not use  . Peripheral vascular disease ([place])   . Diabetes mellitus without complication [facility])     Assessment: 64 yo M presents on 10/14 with SOB and chest pain. Pharmacy consulted to dose heparin for ACS. CBC stable. No s/s of bleed. No anticoag PTA.  Goal of Therapy:  Heparin level 0.3-0.7 units/ml Monitor platelets by anticoagulation protocol: Yes   Plan:  Give 4,000 units heparin BOLUS Start heparin gtt at 1,100 units/hr Check 6 hr HL Monitor daily HL, CBC, s/s of bleed   Javaughn Opdahl J 12/05/2014,5:11 PM

## 2014-12-05 NOTE — Telephone Encounter (Signed)
Call transferred into triage. Pt c/o SOB last night after climbing 2 flights of steps, recovered after a few minutes but this is new for him.  Woke at 2 am with mild chest pain which has gone away without NTG All morning he has been very dizzy and nauseated BP is 149/87, HR is 75  Hx of stent in 2008, restenosis in 2010.  Discussed with Dr. Mare Ferrari (DOD) Recommends patient should go to the ED for evaluation.  Called patient back to inform. I did recommend that he contact EMS to take him.  Pt said his wife will be home shortly and can take him to the [facility] ED.

## 2014-12-05 NOTE — Telephone Encounter (Signed)
Pt c/o Shortness Of Breath: STAT if SOB developed within the last 24 hours or pt is noticeably SOB on the phone  1. Are you currently SOB (can you hear that pt is SOB on the phone)? A little  2. How long have you been experiencing SOB? Since last night when he walk up to flights of steps  3. Are you SOB when sitting or when up moving around? both  4. Are you currently experiencing any other symptoms? Woke up at 2am with slight chest pain, sob and dizziness.

## 2014-12-05 NOTE — ED Notes (Signed)
He noticed last night feeling SOB after walking up 2 flights of stairs. This am he woke with CP, SOB at rest, nausea, dizziiness that have persisted throughout the day. He denies LOC. Symptoms aggravated by exertion, relieved by nothing. A&Ox4, resp e/u

## 2014-12-05 NOTE — ED Notes (Signed)
Attempted report 

## 2014-12-05 NOTE — ED Provider Notes (Signed)
CSN: [id number]     Arrival date & time 12/05/14  1319 History   First MD Initiated Contact with Patient 12/05/14 1502     Chief Complaint  Patient presents with  . Shortness of Breath      HPI Patient presents to emergency department complaining of anterior chest pain and shortness of breath while exerting himself upstairs last night.  He has some nausea at that time.  He then went home and went to bed.  He was awoken this morning with left-sided chest pain with some radiation towards his right chest.  He denies radiation of his pain to his neck or to his shoulders.  There is no pleuritic component.  He reports there is some tenderness of his anterior left chest with palpation but reports this is a different type of discomfort and is feeling.  Patient does have a history of coronary artery disease status post post PCI in 2008 and then again in 2010.  He had a repeat heart catheterization 2012 demonstrated patent arteries at that time.  He does have a history of arterial disease with a aortobifem in 2003.  He sees Dr. Pascal Lux gains from a cardiology standpoint.  He called their office and they recommended that he come to the ER for evaluation.  He reports his pain is left chest is somewhat better at this time but still present.  He denies shortness of breath.  No fevers or chills.  No productive cough.   Past Medical History  Diagnosis Date  . Coronary artery disease   . GERD (gastroesophageal reflux disease)   . Bronchitis   . H/O hiatal hernia   . Arthritis   . Hypercholesteremia   . Dry skin     in the back of head-left side  . Chronic back pain   . Complication of anesthesia     difficulty breathing s/p back surgery at [facility] 2013  . Pneumonia   . Shortness of breath   . Sleep apnea     has CPAP but does not use  . Peripheral vascular disease ([place])   . Diabetes mellitus without complication [facility])    Past Surgical History  Procedure Laterality Date  . Foot surgery  1980s    right  foot  . Back surgery  2004  . Aortic bypass  2003    aortobifemoral bypass  . Cardiac catheterization  2010    x 2 stents RCA  . Shoulder surgery      left shoulder rotator cuff repair  . Colonoscopy  08/04/2009  . Coronary angioplasty      2 stents  . Upper gi endoscopy    . Shoulder arthroscopy with subacromial decompression Right 03/28/2013    Procedure: RIGHT SHOULDER ARTHROSCOPY WITH SUBACROMIAL DECOMPRESSION AND DISTAL CLAVICLE RESECTION;  Surgeon: Marin Shutter, MD;  Location: [place];  Service: Orthopedics;  Laterality: Right;   Family History  Problem Relation Age of Onset  . Colon cancer Mother   . Heart attack Father     , massive  . Coronary artery disease Brother   . Thyroid cancer Brother    Social History  Substance Use Topics  . Smoking status: Former Smoker -- 35 years    Types: Cigarettes  . Smokeless tobacco: None  . Alcohol Use: No    Review of Systems  All other systems reviewed and are negative.     Allergies  Ciprofloxacin and Daypro  Home Medications   Prior to Admission medications  Medication Sig Start Date End Date Taking? Authorizing Provider  aspirin EC 81 MG tablet Take 81 mg by mouth at bedtime.    Historical Provider, MD  atorvastatin (LIPITOR) 80 MG tablet Take 1 tablet (80 mg total) by mouth daily at 6 PM. 10/02/14   [name], MD  clopidogrel (PLAVIX) 75 MG tablet Take 1 tablet by mouth at  bedtime 06/04/14   [name], MD  fenofibrate 160 MG tablet Take 160 mg by mouth at bedtime.    Historical Provider, MD  niacin (NIASPAN) 750 MG CR tablet Take 1,500 mg by mouth at bedtime.    Historical Provider, MD  nitroGLYCERIN (NITROSTAT) 0.4 MG SL tablet Place 0.4 mg under the tongue every 5 (five) minutes as needed. For chest pain    Historical Provider, MD  pantoprazole (PROTONIX) 40 MG tablet Take 40 mg by mouth at bedtime.    Historical Provider, MD  traMADol (ULTRAM-ER) 100 MG 24 hr tablet TAKE 1 TABLET EVERY DAY AS NEEDED 10/13/14    Historical Provider, MD   BP 127/54 mmHg  Pulse 65  Temp(Src) 97.7 F (36.5 C) (Oral)  Resp 16  SpO2 98% Physical Exam  Constitutional: He is oriented to person, place, and time. He appears well-developed and well-nourished.  HENT:  Head: Normocephalic and atraumatic.  Eyes: EOM are normal.  Neck: Normal range of motion.  Cardiovascular: Normal rate, regular rhythm, normal heart sounds and intact distal pulses.   Pulmonary/Chest: Effort normal and breath sounds normal. No respiratory distress.  Abdominal: Soft. He exhibits no distension. There is no tenderness.  Musculoskeletal: Normal range of motion.  Neurological: He is alert and oriented to person, place, and time.  Skin: Skin is warm and dry.  Psychiatric: He has a normal mood and affect. Judgment normal.  Nursing note and vitals reviewed.   ED Course  Procedures (including critical care time) Labs Review Labs Reviewed  BASIC METABOLIC PANEL - Abnormal; Notable for the following:    Glucose, Bld 110 (*)    All other components within normal limits  CBC - Abnormal; Notable for the following:    WBC 11.1 (*)    All other components within normal limits  PROTIME-INR  BRAIN NATRIURETIC PEPTIDE  I-STAT TROPOININ, ED    Imaging Review Dg Chest 2 View  12/05/2014  CLINICAL DATA:  Chest pain and shortness of breath for 1 day. EXAM: CHEST - 2 VIEW COMPARISON:  Two-view chest x-ray 12/25/2013. FINDINGS: The heart size is normal. The lungs are clear. Mild chronic interstitial coarsening is stable. No focal airspace disease is present. There is no edema or effusion to suggest failure. The visualized soft tissues and bony thorax are unremarkable. IMPRESSION: 1. No acute cardiopulmonary disease or significant interval change. Electronically Signed   By: San Morelle M.D.   On: 12/05/2014 14:57   I have personally reviewed and evaluated these images and lab results as part of my medical decision-making.   EKG  Interpretation   Date/Time:  Friday December 05 2014 13:30:26 EDT Ventricular Rate:  81 PR Interval:  134 QRS Duration: 96 QT Interval:  372 QTC Calculation: 432 R Axis:   -17 Text Interpretation:  Normal sinus rhythm Normal ECG No significant change  was found Confirmed by Eliaz Fout  MD, Clotee Schlicker ([id number]) on 12/05/2014 3:42:42 PM       ECG interpretation    Date: 12/05/2014  1636  Rate: 67  Rhythm: normal sinus rhythm  QRS Axis: normal  Intervals: normal  ST/T Wave abnormalities: normal  Conduction Disutrbances: none  Narrative Interpretation:   Old EKG Reviewed: No significant changes noted     MDM   Final diagnoses:  Chest pain, unspecified chest pain type  Unstable angina (HCC)    Symptoms concerning for unstable angina.  EKG without ischemic changes.  Repeat EKG demonstrates no significant change.  Initial troponin is negative.  Case discussed with cardiology will valuate at the bedside.    Jola Schmidt, MD 12/05/14 [id number]

## 2014-12-05 NOTE — H&P (Signed)
Cardiologist: Ray Brown is an 64 y.o. male.   Chief Complaint: SOB, Chest pain HPI:   Ray Brown is a 64 y.o. male with coronary artery disease drug eluting stent to the RCA August 2008.  In July 2010, he had instent restenosis with another stent placement.  He also has hypertension, hyperlipidemia, DM, aortobifemoral bypass in 2003-Dr. Early. Right iliac stent, sleep apnea not on CPAP.  10/2010-cardiac catheterization showing patent RCA stents after nuclear stress test showed inferior wall ischemia.   Patient presents with shortness of breath, dizziness and chest pain. He said yesterday he was at his granddaughter's house he walked up 2 sets of stairs and was completely out of breath when he got to the top. He's been up the stairs several times and has not had that problem before. He's also been dizzy throughout the day today. He woke up at 0200 hrs. this morning with 2 out of 10 chest pain. He thinks it may have woken him up from sleep. At that time the pain felt like a knife or pin in his left side. He wasn't nauseous yesterday but today's panache nauseated. When I had him sit up during the exam he developed 4 out of 10 dull chest pain.  He says the symptoms are similar to prior episodes when his needed a stent.  The patient currently denies  vomiting, fever, orthopnea, PND, cough, congestion, abdominal pain, hematochezia, melena, lower extremity edema.  Patient also reports having bilateral hip and upper leg pain with walking short distances.  He has an appointment scheduled with Dr. Donnetta Hutching.  Medications: Medication Sig  aspirin EC 81 MG tablet Take 81 mg by mouth at bedtime.  atorvastatin (LIPITOR) 80 MG tablet Take 1 tablet (80 mg total) by mouth daily at 6 PM.  clopidogrel (PLAVIX) 75 MG tablet Take 1 tablet by mouth at  bedtime  fenofibrate 160 MG tablet Take 160 mg by mouth at bedtime.  niacin (NIASPAN) 750 MG CR tablet Take 1,500 mg by mouth at bedtime.  nitroGLYCERIN  (NITROSTAT) 0.4 MG SL tablet Place 0.4 mg under the tongue every 5 (five) minutes as needed. For chest pain  pantoprazole (PROTONIX) 40 MG tablet Take 40 mg by mouth at bedtime.  traMADol (ULTRAM-ER) 100 MG 24 hr tablet TAKE 1 TABLET EVERY DAY AS NEEDED      Past Medical History  Diagnosis Date  . Coronary artery disease   . GERD (gastroesophageal reflux disease)   . Bronchitis   . H/O hiatal hernia   . Arthritis   . Hypercholesteremia   . Dry skin     in the back of head-left side  . Chronic back pain   . Complication of anesthesia     difficulty breathing s/p back surgery at [facility] 2013  . Pneumonia   . Shortness of breath   . Sleep apnea     has CPAP but does not use  . Peripheral vascular disease ([place])   . Diabetes mellitus without complication [facility])     Past Surgical History  Procedure Laterality Date  . Foot surgery  1980s    right foot  . Back surgery  2004  . Aortic bypass  2003    aortobifemoral bypass  . Cardiac catheterization  2010    x 2 stents RCA  . Shoulder surgery      left shoulder rotator cuff repair  . Colonoscopy  08/04/2009  . Coronary angioplasty      2 stents  .  Upper gi endoscopy    . Shoulder arthroscopy with subacromial decompression Right 03/28/2013    Procedure: RIGHT SHOULDER ARTHROSCOPY WITH SUBACROMIAL DECOMPRESSION AND DISTAL CLAVICLE RESECTION;  Surgeon: Marin Shutter, MD;  Location: [place];  Service: Orthopedics;  Laterality: Right;    Family History  Problem Relation Age of Onset  . Colon cancer Mother   . Heart attack Father     , massive  . Coronary artery disease Brother   . Thyroid cancer Brother    Social History:  reports that he has quit smoking. His smoking use included Cigarettes. He quit after 35 years of use. He does not have any smokeless tobacco history on file. He reports that he does not drink alcohol or use illicit drugs.  Allergies:  Allergies  Allergen Reactions  . Ciprofloxacin Rash  . Daypro [Oxaprozin] Rash      (Not in a hospital admission)  Results for orders placed or performed during the hospital encounter of 12/05/14 (from the past 48 hour(s))  Basic metabolic panel     Status: Abnormal   Collection Time: 12/05/14  1:48 PM  Result Value Ref Range   Sodium 137 135 - 145 mmol/L   Potassium 4.1 3.5 - 5.1 mmol/L   Chloride 104 101 - 111 mmol/L   CO2 26 22 - 32 mmol/L   Glucose, Bld 110 (H) 65 - 99 mg/dL   BUN 14 6 - 20 mg/dL   Creatinine, Ser 0.88 0.61 - 1.24 mg/dL   Calcium 9.3 8.9 - 10.3 mg/dL   GFR calc non Af Amer >60 >60 mL/min   GFR calc Af Amer >60 >60 mL/min    Comment: (NOTE) The eGFR has been calculated using the CKD EPI equation. This calculation has not been validated in all clinical situations. eGFR's persistently <60 mL/min signify possible Chronic Kidney Disease.    Anion gap 7 5 - 15  CBC     Status: Abnormal   Collection Time: 12/05/14  1:48 PM  Result Value Ref Range   WBC 11.1 (H) 4.0 - 10.5 K/uL   RBC 4.84 4.22 - 5.81 MIL/uL   Hemoglobin 15.2 13.0 - 17.0 g/dL   HCT 44.1 39.0 - 52.0 %   MCV 91.1 78.0 - 100.0 fL   MCH 31.4 26.0 - 34.0 pg   MCHC 34.5 30.0 - 36.0 g/dL   RDW 13.1 11.5 - 15.5 %   Platelets 231 150 - 400 K/uL  Protime-INR - (order if Patient is taking Coumadin / Warfarin)     Status: None   Collection Time: 12/05/14  1:48 PM  Result Value Ref Range   Prothrombin Time 13.5 11.6 - 15.2 seconds   INR 1.01 0.00 - 1.49  Brain natriuretic peptide     Status: None   Collection Time: 12/05/14  1:48 PM  Result Value Ref Range   B Natriuretic Peptide 15.7 0.0 - 100.0 pg/mL  I-stat troponin, ED     Status: None   Collection Time: 12/05/14  2:01 PM  Result Value Ref Range   Troponin i, poc 0.01 0.00 - 0.08 ng/mL   Comment 3            Comment: Due to the release kinetics of cTnI, a negative result within the first hours of the onset of symptoms does not rule out myocardial infarction with certainty. If myocardial infarction is still  suspected, repeat the test at appropriate intervals.    Dg Chest 2 View  12/05/2014  CLINICAL DATA:  Chest pain and shortness of breath for 1 day. EXAM: CHEST - 2 VIEW COMPARISON:  Two-view chest x-ray 12/25/2013. FINDINGS: The heart size is normal. The lungs are clear. Mild chronic interstitial coarsening is stable. No focal airspace disease is present. There is no edema or effusion to suggest failure. The visualized soft tissues and bony thorax are unremarkable. IMPRESSION: 1. No acute cardiopulmonary disease or significant interval change. Electronically Signed   By: San Morelle M.D.   On: 12/05/2014 14:57    Review of Systems  Constitutional: Negative for fever, chills and diaphoresis.  HENT: Negative for congestion and sore throat.   Respiratory: Positive for shortness of breath. Negative for cough.   Cardiovascular: Positive for chest pain and claudication. Negative for palpitations, orthopnea, leg swelling and PND.  Gastrointestinal: Positive for nausea. Negative for vomiting, abdominal pain, blood in stool and melena.  Genitourinary: Negative for hematuria.  Musculoskeletal: Negative for myalgias.  Neurological: Positive for dizziness. Negative for weakness.  All other systems reviewed and are negative.   Blood pressure 139/70, pulse 68, temperature 97.7 F (36.5 C), temperature source Oral, resp. rate 12, SpO2 98 %. Physical Exam  Nursing note and vitals reviewed. Constitutional: He is oriented to person, place, and time. He appears well-developed and well-nourished. No distress.  HENT:  Head: Normocephalic and atraumatic.  Mouth/Throat: No oropharyngeal exudate.  Eyes: EOM are normal. Pupils are equal, round, and reactive to light. No scleral icterus.  Neck: Normal range of motion. No JVD present.  Cardiovascular: Normal rate, regular rhythm, S1 normal and S2 normal.   No murmur heard. Pulses:      Radial pulses are 2+ on the right side, and 2+ on the left side.        Dorsalis pedis pulses are 2+ on the right side, and 2+ on the left side.       Posterior tibial pulses are 2+ on the right side, and 2+ on the left side.  No carotid bruits  Respiratory: Effort normal and breath sounds normal. He has no wheezes. He has no rales.  GI: Soft. Bowel sounds are normal. He exhibits no distension. There is no tenderness.  Musculoskeletal: He exhibits no edema.  Lymphadenopathy:    He has no cervical adenopathy.  Neurological: He is alert and oriented to person, place, and time. He exhibits normal muscle tone.  Skin: Skin is warm and dry.  Psychiatric: He has a normal mood and affect.     Assessment/Plan Principal Problem:   Unstable angina [facility]) Active Problems:   Coronary artery disease   Peripheral vascular disease (HCC)   Hyperlipemia   Diabetes mellitus with peripheral artery disease (HCC)   Dyspnea   Dizziness  63 y.o. male with coronary artery disease drug eluting stent to the RCA August 2008.  In July 2010, he had instent restenosis with another stent placement.  He also has hypertension, hyperlipidemia, DM, aortobifemoral bypass in 2003-Dr. Early. Right iliac stent, sleep apnea not on CPAP.  10/2010-cardiac catheterization showing patent RCA stents after nuclear stress test showed inferior wall ischemia.  Patient presents with dyspnea and chest pain which is similar to prior episodes at which time he needed stents..  EKG shows no acute changes blood pressure and heart rate are controlled initial troponin is negative.  Chest x-ray shows nothing acute.    Patient will be admitted for observation. We'll continue cycle troponin.  We'll add IV heparin and nitroglycerin.  Left heart cath Monday.  Will order echo.  Check  lipids.  Monitor on telemetry for explanation of dizziness which occurs regardless of position.   Tarri Fuller, [place] 12/05/2014, 4:54 PM    Agree with note written by Luisa Dago PAC  Pt with known CAD and PAD s/p cor stenting and  restenting in the past. H/O ABFBG. Other probs as outlined. Pt has developed accelerated angina and SOB over the past few days, worse last PM and today. Pain was relieved with SL NTG. Currently pain free, Exam benign. EKG w/o acute changes. Labs OK. Enz neg. Plan adm 2H step down. IV hep/ntg. Cath on Monday.   Quay Burow 12/05/2014 5:12 PM

## 2014-12-06 DIAGNOSIS — E1151 Type 2 diabetes mellitus with diabetic peripheral angiopathy without gangrene: Secondary | ICD-10-CM

## 2014-12-06 DIAGNOSIS — R42 Dizziness and giddiness: Secondary | ICD-10-CM

## 2014-12-06 DIAGNOSIS — G4733 Obstructive sleep apnea (adult) (pediatric): Secondary | ICD-10-CM

## 2014-12-06 DIAGNOSIS — I10 Essential (primary) hypertension: Secondary | ICD-10-CM

## 2014-12-06 DIAGNOSIS — E785 Hyperlipidemia, unspecified: Secondary | ICD-10-CM

## 2014-12-06 DIAGNOSIS — I25110 Atherosclerotic heart disease of native coronary artery with unstable angina pectoris: Principal | ICD-10-CM

## 2014-12-06 LAB — TROPONIN I
Troponin I: <0.03 ng/mL (ref <0.031)
Troponin I: <0.03 ng/mL

## 2014-12-06 LAB — CBC
HEMATOCRIT: 39.7 % (ref 39.0–52.0)
HEMOGLOBIN: 13.6 g/dL (ref 13.0–17.0)
MCH: 31.6 pg (ref 26.0–34.0)
MCHC: 34.3 g/dL (ref 30.0–36.0)
MCV: 92.1 fL (ref 78.0–100.0)
Platelets: 189 10*3/uL (ref 150–400)
RBC: 4.31 MIL/uL (ref 4.22–5.81)
RDW: 13.2 % (ref 11.5–15.5)
WBC: 7.5 10*3/uL (ref 4.0–10.5)

## 2014-12-06 LAB — HEPARIN LEVEL (UNFRACTIONATED)
Heparin Unfractionated: 0.26 IU/mL — ABNORMAL LOW (ref 0.30–0.70)
Heparin Unfractionated: 0.32 [IU]/mL (ref 0.30–0.70)
Heparin Unfractionated: 0.48 [IU]/mL (ref 0.30–0.70)

## 2014-12-06 NOTE — Progress Notes (Signed)
SUBJECTIVE: Feeling well. Denies chest pain and shortness of breath.     Intake/Output Summary (Last 24 hours) at 12/06/14 1225 Last data filed at 12/06/14 0958  Gross per 24 hour  Intake    840 ml  Output    800 ml  Net     40 ml    Current Facility-Administered Medications  Medication Dose Route Frequency Provider Last Rate Last Dose  . acetaminophen (TYLENOL) tablet 650 mg  650 mg Oral Q4H PRN Brett Canales, PA-C      . aspirin EC tablet 81 mg  81 mg Oral QHS Brett Canales, PA-C   81 mg at 12/05/14 2104  . atorvastatin (LIPITOR) tablet 80 mg  80 mg Oral q1800 Brett Canales, PA-C   80 mg at 12/05/14 2130  . fenofibrate tablet 160 mg  160 mg Oral QHS Brett Canales, PA-C   160 mg at 12/05/14 2123  . heparin ADULT infusion 100 units/mL (25000 units/250 mL)  1,300 Units/hr Intravenous Continuous Roma Schanz, RPH 13 mL/hr at 12/06/14 0936 1,300 Units/hr at 12/06/14 0936  . metoprolol tartrate (LOPRESSOR) tablet 12.5 mg  12.5 mg Oral BID Brett Canales, PA-C   12.5 mg at 12/06/14 8413  . niacin (NIASPAN) CR tablet 1,500 mg  1,500 mg Oral QHS Brett Canales, PA-C   1,500 mg at 12/05/14 2122  . nitroGLYCERIN (NITROSTAT) SL tablet 0.4 mg  0.4 mg Sublingual Q5 min PRN Brett Canales, PA-C   0.4 mg at 12/05/14 1637  . nitroGLYCERIN 50 mg in dextrose 5 % 250 mL (0.2 mg/mL) infusion  2-200 mcg/min Intravenous Titrated Brett Canales, PA-C 1.5 mL/hr at 12/05/14 1842 5 mcg/min at 12/05/14 1842  . ondansetron (ZOFRAN) injection 4 mg  4 mg Intravenous Q6H PRN Brett Canales, PA-C      . pantoprazole (PROTONIX) EC tablet 40 mg  40 mg Oral QHS Brett Canales, PA-C   40 mg at 12/05/14 2122    Filed Vitals:   12/06/14 0225 12/06/14 0325 12/06/14 0425 12/06/14 0525  BP: 98/55 110/57 108/61 100/58  Pulse: 67 64 70 71  Temp:   97.6 F (36.4 C)   TempSrc:   Oral   Resp: 19 18 16 15   Height:      Weight:      SpO2: 93% 93% 95% 93%    PHYSICAL EXAM General: NAD HEENT: Normal. Neck: No  JVD, no thyromegaly.  Lungs: Clear to auscultation bilaterally with normal respiratory effort. CV: Nondisplaced PMI.  Regular rate and rhythm, normal S1/S2, no S3/S4, no murmur.  No pretibial edema.    Abdomen: Soft, nontender, no distention.  Neurologic: Alert and oriented x 3.  Psych: Normal affect. Musculoskeletal: No gross deformities. Extremities: No clubbing or cyanosis.   TELEMETRY: Reviewed telemetry pt in sinus rhythm.  LABS: Basic Metabolic Panel:  Recent Labs  12/05/14 1348  NA 137  K 4.1  CL 104  CO2 26  GLUCOSE 110*  BUN 14  CREATININE 0.88  CALCIUM 9.3   Liver Function Tests: No results for input(s): AST, ALT, ALKPHOS, BILITOT, PROT, ALBUMIN in the last 72 hours. No results for input(s): LIPASE, AMYLASE in the last 72 hours. CBC:  Recent Labs  12/05/14 1348 12/06/14 0224  WBC 11.1* 7.5  HGB 15.2 13.6  HCT 44.1 39.7  MCV 91.1 92.1  PLT 231 189   Cardiac Enzymes:  Recent Labs  12/05/14 2055 12/06/14 0224 12/06/14  0745  TROPONINI <0.03 <0.03 <0.03   BNP: Invalid input(s): POCBNP D-Dimer: No results for input(s): DDIMER in the last 72 hours. Hemoglobin A1C: No results for input(s): HGBA1C in the last 72 hours. Fasting Lipid Panel: No results for input(s): CHOL, HDL, LDLCALC, TRIG, CHOLHDL, LDLDIRECT in the last 72 hours. Thyroid Function Tests: No results for input(s): TSH, T4TOTAL, T3FREE, THYROIDAB in the last 72 hours.  Invalid input(s): FREET3 Anemia Panel: No results for input(s): VITAMINB12, FOLATE, FERRITIN, TIBC, IRON, RETICCTPCT in the last 72 hours.  RADIOLOGY: Dg Chest 2 View  12/05/2014  CLINICAL DATA:  Chest pain and shortness of breath for 1 day. EXAM: CHEST - 2 VIEW COMPARISON:  Two-view chest x-ray 12/25/2013. FINDINGS: The heart size is normal. The lungs are clear. Mild chronic interstitial coarsening is stable. No focal airspace disease is present. There is no edema or effusion to suggest failure. The visualized soft  tissues and bony thorax are unremarkable. IMPRESSION: 1. No acute cardiopulmonary disease or significant interval change. Electronically Signed   By: San Morelle M.D.   On: 12/05/2014 14:57      ASSESSMENT AND PLAN: 1. Unstable angina: Symptomatically stable on IV nitro and heparin. Has RCA stents. On ASA, Lipitor, and metoprolol. Echo pending. Cath Monday.  2. Essential HTN: BP low normal.  3. Hyperlipidemia: On statin therapy.  4. PVD with aortobifemoral bypass in 2003: On ASA and statin.  5. OSA: Not on CPAP.   Kate Sable, M.D., F.A.C.C.

## 2014-12-06 NOTE — Progress Notes (Addendum)
ANTICOAGULATION CONSULT NOTE - Follow Up Consult  Pharmacy Consult for Heparin  Indication: chest pain/ACS  Allergies  Allergen Reactions  . Ciprofloxacin Rash  . Daypro [Oxaprozin] Rash    Patient Measurements: Height: 5\' 8"  (172.7 cm) Weight: 201 lb 4.8 oz (91.309 kg) IBW/kg (Calculated) : 68.4  Vital Signs: Temp: 97.6 F (36.4 C) (10/15 0425) Temp Source: Oral (10/15 0425) BP: 100/58 mmHg (10/15 0525) Pulse Rate: 71 (10/15 0525)  Labs:  Recent Labs  12/05/14 1348 12/05/14 2055 12/06/14 0012 12/06/14 0224 12/06/14 0745  HGB 15.2  --   --  13.6  --   HCT 44.1  --   --  39.7  --   PLT 231  --   --  189  --   LABPROT 13.5  --   --   --   --   INR 1.01  --   --   --   --   HEPARINUNFRC  --   --  0.32  --  0.26*  CREATININE 0.88  --   --   --   --   TROPONINI  --  <0.03  --  <0.03 <0.03    Estimated Creatinine Clearance: 91.9 mL/min (by C-G formula based on Cr of 0.88).  Assessment: 64 yo M presents on 10/14 with SOB and chest pain. Pharmacy consulted to dose heparin for ACS. CBC stable. No s/s of bleed. No anticoag PTA. HL 0.26 on 1100 units/hr and 4000 unit bolus.   Goal of Therapy:  Heparin level 0.3-0.7 units/ml Monitor platelets by anticoagulation protocol: Yes   Plan:  Increase to heparin 1300 units/hr 1530 HL Monitor daily HL, CBC, s/s of bleed  Joya San, PharmD Clinical Pharmacy Resident Pager # [phone] 12/06/2014 9:15 AM   Addendum: Heparin level is now therapeutic at 0.48 after rate increase. Continue heparin at 1300 units/hr and follow up AM labs.  Nena Jordan, PharmD, BCPS 12/06/2014, 4:15 PM

## 2014-12-06 NOTE — Progress Notes (Signed)
ANTICOAGULATION CONSULT NOTE - Follow Up Consult  Pharmacy Consult for Heparin  Indication: chest pain/ACS  Allergies  Allergen Reactions  . Ciprofloxacin Rash  . Daypro [Oxaprozin] Rash    Patient Measurements: Height: 5\' 8"  (172.7 cm) Weight: 201 lb 4.8 oz (91.309 kg) IBW/kg (Calculated) : 68.4  Vital Signs: Temp: 97.6 F (36.4 C) (10/14 2358) Temp Source: Oral (10/14 2019) BP: 123/77 mmHg (10/14 2019) Pulse Rate: 69 (10/14 2019)  Labs:  Recent Labs  12/05/14 1348 12/05/14 2055 12/06/14 0012  HGB 15.2  --   --   HCT 44.1  --   --   PLT 231  --   --   LABPROT 13.5  --   --   INR 1.01  --   --   HEPARINUNFRC  --   --  0.32  CREATININE 0.88  --   --   TROPONINI  --  <0.03  --     Estimated Creatinine Clearance: 91.9 mL/min (by C-G formula based on Cr of 0.88).  Assessment: Therapeutic heparin level x 1  Goal of Therapy:  Heparin level 0.3-0.7 units/ml Monitor platelets by anticoagulation protocol: Yes   Plan:  -Continue heparin at 1100 units/hr -0800 HL  Doye Montilla 12/06/2014,1:01 AM

## 2014-12-07 ENCOUNTER — Inpatient Hospital Stay (HOSPITAL_COMMUNITY): Payer: Commercial Managed Care - HMO

## 2014-12-07 DIAGNOSIS — I7101 Dissection of thoracic aorta: Secondary | ICD-10-CM

## 2014-12-07 LAB — CBC
HEMATOCRIT: 42 % (ref 39.0–52.0)
HEMOGLOBIN: 14.2 g/dL (ref 13.0–17.0)
MCH: 31.3 pg (ref 26.0–34.0)
MCHC: 33.8 g/dL (ref 30.0–36.0)
MCV: 92.7 fL (ref 78.0–100.0)
Platelets: 211 10*3/uL (ref 150–400)
RBC: 4.53 MIL/uL (ref 4.22–5.81)
RDW: 13.3 % (ref 11.5–15.5)
WBC: 8.5 10*3/uL (ref 4.0–10.5)

## 2014-12-07 LAB — HEPARIN LEVEL (UNFRACTIONATED): HEPARIN UNFRACTIONATED: 0.5 [IU]/mL (ref 0.30–0.70)

## 2014-12-07 NOTE — Progress Notes (Signed)
  Echocardiogram 2D Echocardiogram has been performed.  Ray Brown 12/07/2014, 10:22 AM

## 2014-12-07 NOTE — Progress Notes (Signed)
Patient Name: Ray Brown Date of Encounter: 12/07/2014  Primary Cardiologist: Dr. [name]   Principal Problem:   Unstable angina [facility]) Active Problems:   Coronary artery disease   Peripheral vascular disease ([place])   Hyperlipemia   Diabetes mellitus with peripheral artery disease ([place])   Dyspnea   Dizziness    SUBJECTIVE  Denies any CP or SOB overnight.   CURRENT MEDS . aspirin EC  81 mg Oral QHS  . atorvastatin  80 mg Oral q1800  . fenofibrate  160 mg Oral QHS  . metoprolol tartrate  12.5 mg Oral BID  . niacin  1,500 mg Oral QHS  . pantoprazole  40 mg Oral QHS    OBJECTIVE  Filed Vitals:   12/06/14 1304 12/06/14 2000 12/06/14 2328 12/07/14 0400  BP: 101/63 119/60 127/61 106/64  Pulse: 67 72 67 64  Temp:  97.2 F (36.2 C) 97.3 F (36.3 C) 97.9 F (36.6 C)  TempSrc:      Resp: 23 24 18 20   Height:      Weight:    201 lb 14.4 oz (91.581 kg)  SpO2: 95% 97% 92% 96%    Intake/Output Summary (Last 24 hours) at 12/07/14 0728 Last data filed at 12/07/14 0500  Gross per 24 hour  Intake 1793.6 ml  Output   3850 ml  Net -2056.4 ml   Filed Weights   12/05/14 2019 12/07/14 0400  Weight: 201 lb 4.8 oz (91.309 kg) 201 lb 14.4 oz (91.581 kg)    PHYSICAL EXAM  General: Pleasant, NAD. Neuro: Alert and oriented X 3. Moves all extremities spontaneously. Psych: Normal affect. HEENT:  Normal  Neck: Supple without bruits or JVD. Lungs:  Resp regular and unlabored, CTA. Heart: RRR no s3, s4, or murmurs. Abdomen: Soft, non-tender, non-distended, BS + x 4.  Extremities: No clubbing, cyanosis or edema. DP/PT/Radials 2+ and equal bilaterally.  Accessory Clinical Findings  CBC  Recent Labs  12/06/14 0224 12/07/14 0340  WBC 7.5 8.5  HGB 13.6 14.2  HCT 39.7 42.0  MCV 92.1 92.7  PLT 189 824   Basic Metabolic Panel  Recent Labs  12/05/14 1348  NA 137  K 4.1  CL 104  CO2 26  GLUCOSE 110*  BUN 14  CREATININE 0.88  CALCIUM 9.3   Cardiac  Enzymes  Recent Labs  12/05/14 2055 12/06/14 0224 12/06/14 0745  TROPONINI <0.03 <0.03 <0.03    TELE NSR without significant ventricular ectopy    ECG  NSR without significant ST-T wave changes  Echocardiogram  pending    Radiology/Studies  Dg Chest 2 View  12/05/2014  CLINICAL DATA:  Chest pain and shortness of breath for 1 day. EXAM: CHEST - 2 VIEW COMPARISON:  Two-view chest x-ray 12/25/2013. FINDINGS: The heart size is normal. The lungs are clear. Mild chronic interstitial coarsening is stable. No focal airspace disease is present. There is no edema or effusion to suggest failure. The visualized soft tissues and bony thorax are unremarkable. IMPRESSION: 1. No acute cardiopulmonary disease or significant interval change. Electronically Signed   By: San Morelle M.D.   On: 12/05/2014 14:57    ASSESSMENT AND PLAN  1. Unstable angina: on IV nitro and heparin.   - echo pending and pending cath Monday  - Risk and benefit of procedure explained to the patient who display clear understanding and agree to proceed. Discussed with patient possible procedural risk include bleeding, vascular injury, renal injury, arrythmia, MI, stroke and loss of limb or life.  2. CAD DES to RCA in 09/2006  3. HTN  4. HLD  5. PVD with aortobifemoral bypass in 2003: On ASA and statin.  6. OSA: Not on CPAP  Signed, Woodward Ku Pager: [phone]  The patient was seen and examined, and I agree with the assessment and plan as documented above, with modifications as noted below. Pt with unstable angina. Walked around nursing station without chest pain on IV nitro 5 mcg/min. Plan for cath tomorrow.

## 2014-12-07 NOTE — Progress Notes (Signed)
ANTICOAGULATION CONSULT NOTE - Follow Up Consult  Pharmacy Consult for Heparin  Indication: chest pain/ACS  Allergies  Allergen Reactions  . Ciprofloxacin Rash  . Daypro [Oxaprozin] Rash    Patient Measurements: Height: 5\' 8"  (172.7 cm) Weight: 201 lb 14.4 oz (91.581 kg) IBW/kg (Calculated) : 68.4  Vital Signs: Temp: 97.9 F (36.6 C) (10/16 0400) BP: 106/64 mmHg (10/16 0400) Pulse Rate: 64 (10/16 0400)  Labs:  Recent Labs  12/05/14 1348 12/05/14 2055  12/06/14 0224 12/06/14 0745 12/06/14 1542 12/07/14 0340  HGB 15.2  --   --  13.6  --   --  14.2  HCT 44.1  --   --  39.7  --   --  42.0  PLT 231  --   --  189  --   --  211  LABPROT 13.5  --   --   --   --   --   --   INR 1.01  --   --   --   --   --   --   HEPARINUNFRC  --   --   < >  --  0.26* 0.48 0.50  CREATININE 0.88  --   --   --   --   --   --   TROPONINI  --  <0.03  --  <0.03 <0.03  --   --   < > = values in this interval not displayed.  Estimated Creatinine Clearance: 92 mL/min (by C-G formula based on Cr of 0.88).  Assessment: 64 yo M presents on 10/14 with SOB and chest pain. Pharmacy consulted to dose heparin for ACS. CBC stable. No s/s of bleed. No anticoag PTA. HL remains therapeutic x2 on 1300 units/hr.  Goal of Therapy:  Heparin level 0.3-0.7 units/ml Monitor platelets by anticoagulation protocol: Yes   Plan:  Continue heparin 1300 units/hr Monitor daily HL, CBC, s/s of bleed  Joya San, PharmD Clinical Pharmacy Resident Pager # [phone] 12/07/2014 8:20 AM

## 2014-12-08 ENCOUNTER — Encounter (HOSPITAL_COMMUNITY)
Admission: EM | Disposition: A | Discharge status: Home / Self Care | Payer: Self-pay | Source: Home / Self Care | Attending: Cardiovascular Disease

## 2014-12-08 ENCOUNTER — Encounter (HOSPITAL_COMMUNITY): Payer: Self-pay | Admitting: Cardiovascular Disease

## 2014-12-08 HISTORY — PX: CARDIAC CATHETERIZATION: SHX172

## 2014-12-08 LAB — CBC
HEMATOCRIT: 42.4 % (ref 39.0–52.0)
HEMOGLOBIN: 14.1 g/dL (ref 13.0–17.0)
MCH: 30.8 pg (ref 26.0–34.0)
MCHC: 33.3 g/dL (ref 30.0–36.0)
MCV: 92.6 fL (ref 78.0–100.0)
Platelets: 206 10*3/uL (ref 150–400)
RBC: 4.58 MIL/uL (ref 4.22–5.81)
RDW: 13.2 % (ref 11.5–15.5)
WBC: 7.4 10*3/uL (ref 4.0–10.5)

## 2014-12-08 LAB — HEPARIN LEVEL (UNFRACTIONATED): Heparin Unfractionated: 0.46 IU/mL (ref 0.30–0.70)

## 2014-12-08 LAB — HEMOGLOBIN A1C
HEMOGLOBIN A1C: 6.5 % — AB (ref 4.8–5.6)
Mean Plasma Glucose: 140 mg/dL

## 2014-12-08 SURGERY — LEFT HEART CATH AND CORONARY ANGIOGRAPHY
Anesthesia: LOCAL

## 2014-12-08 MED ORDER — MIDAZOLAM HCL 2 MG/2ML IJ SOLN
INTRAMUSCULAR | Status: DC | PRN
  Administered 2014-12-08: 1 mg via INTRAVENOUS

## 2014-12-08 MED ORDER — SODIUM CHLORIDE 0.9 % IV SOLN: 250.0000 mL | INTRAVENOUS | Status: DC | PRN

## 2014-12-08 MED ORDER — CLOPIDOGREL BISULFATE 75 MG PO TABS
75.0000 mg | ORAL_TABLET | Freq: Every day | ORAL | Status: DC
Start: 2014-12-09 — End: 2014-12-08

## 2014-12-08 MED ORDER — VERAPAMIL HCL 2.5 MG/ML IV SOLN
INTRAVENOUS | Status: AC
  Filled 2014-12-08: qty 2

## 2014-12-08 MED ORDER — IOHEXOL 350 MG/ML SOLN
INTRAVENOUS | Status: DC | PRN
  Administered 2014-12-08: 80 mL via INTRA_ARTERIAL

## 2014-12-08 MED ORDER — SODIUM CHLORIDE 0.9 % WEIGHT BASED INFUSION: 1.0000 mL/kg/h | INTRAVENOUS | Status: DC

## 2014-12-08 MED ORDER — NITROGLYCERIN 1 MG/10 ML FOR IR/CATH LAB
INTRA_ARTERIAL | Status: DC | PRN
  Administered 2014-12-08: 13:00:00

## 2014-12-08 MED ORDER — DILTIAZEM HCL 60 MG PO TABS: 60.0000 mg | ORAL_TABLET | Freq: Once | ORAL | Status: DC

## 2014-12-08 MED ORDER — MIDAZOLAM HCL 2 MG/2ML IJ SOLN
INTRAMUSCULAR | Status: AC
  Filled 2014-12-08: qty 4

## 2014-12-08 MED ORDER — FENTANYL CITRATE (PF) 100 MCG/2ML IJ SOLN
INTRAMUSCULAR | Status: DC | PRN
  Administered 2014-12-08: 25 ug via INTRAVENOUS

## 2014-12-08 MED ORDER — NITROGLYCERIN 1 MG/10 ML FOR IR/CATH LAB
INTRA_ARTERIAL | Status: AC
  Filled 2014-12-08: qty 10

## 2014-12-08 MED ORDER — SODIUM CHLORIDE 0.9 % IJ SOLN: 3.0000 mL | INTRAMUSCULAR | Status: DC | PRN

## 2014-12-08 MED ORDER — ONDANSETRON HCL 4 MG/2ML IJ SOLN: 4.0000 mg | Freq: Four times a day (QID) | INTRAMUSCULAR | Status: DC | PRN

## 2014-12-08 MED ORDER — FENTANYL CITRATE (PF) 100 MCG/2ML IJ SOLN
INTRAMUSCULAR | Status: AC
  Filled 2014-12-08: qty 4

## 2014-12-08 MED ORDER — ATORVASTATIN CALCIUM 80 MG PO TABS
80.0000 mg | ORAL_TABLET | Freq: Every day | ORAL | Status: DC
  Filled 2014-12-08: qty 1

## 2014-12-08 MED ORDER — SODIUM CHLORIDE 0.9 % WEIGHT BASED INFUSION: 3.0000 mL/kg/h | INTRAVENOUS | Status: AC

## 2014-12-08 MED ORDER — ASPIRIN 81 MG PO CHEW
81.0000 mg | CHEWABLE_TABLET | ORAL | Status: AC
  Administered 2014-12-08: 81 mg via ORAL
  Filled 2014-12-08: qty 1

## 2014-12-08 MED ORDER — LIDOCAINE HCL (PF) 1 % IJ SOLN
INTRAMUSCULAR | Status: DC | PRN
  Administered 2014-12-08: 5 mL

## 2014-12-08 MED ORDER — ACETAMINOPHEN 325 MG PO TABS: 650.0000 mg | ORAL_TABLET | ORAL | Status: DC | PRN

## 2014-12-08 MED ORDER — MORPHINE SULFATE (PF) 2 MG/ML IV SOLN: 2.0000 mg | INTRAVENOUS | Status: DC | PRN

## 2014-12-08 MED ORDER — METOPROLOL SUCCINATE ER 25 MG PO TB24: 25.0000 mg | ORAL_TABLET | Freq: Every day | ORAL | Status: DC

## 2014-12-08 MED ORDER — HEPARIN (PORCINE) IN NACL 2-0.9 UNIT/ML-% IJ SOLN
INTRAMUSCULAR | Status: AC
  Filled 2014-12-08: qty 1000

## 2014-12-08 MED ORDER — SODIUM CHLORIDE 0.9 % WEIGHT BASED INFUSION
3.0000 mL/kg/h | INTRAVENOUS | Status: DC
  Administered 2014-12-08: 3 mL/kg/h via INTRAVENOUS

## 2014-12-08 MED ORDER — VERAPAMIL HCL 2.5 MG/ML IV SOLN
INTRA_ARTERIAL | Status: DC | PRN
  Administered 2014-12-08 (×2): 5 mL via INTRA_ARTERIAL

## 2014-12-08 MED ORDER — HEPARIN SODIUM (PORCINE) 1000 UNIT/ML IJ SOLN
INTRAMUSCULAR | Status: DC | PRN
  Administered 2014-12-08: 4500 [IU] via INTRAVENOUS

## 2014-12-08 MED ORDER — SODIUM CHLORIDE 0.9 % IJ SOLN: 3.0000 mL | Freq: Two times a day (BID) | INTRAMUSCULAR | Status: DC

## 2014-12-08 MED ORDER — LIDOCAINE HCL (PF) 1 % IJ SOLN
INTRAMUSCULAR | Status: AC
  Filled 2014-12-08: qty 30

## 2014-12-08 SURGICAL SUPPLY — 11 items

## 2014-12-08 NOTE — Interval H&P Note (Signed)
Cath Lab Visit (complete for each Cath Lab visit)  Clinical Evaluation Leading to the Procedure:   ACS: Yes.    Non-ACS:    Anginal Classification: CCS IV  Anti-ischemic medical therapy: Minimal Therapy (1 class of medications)  Non-Invasive Test Results: No non-invasive testing performed  Prior CABG: No previous CABG      History and Physical Interval Note:  12/08/2014 11:56 AM  [name]  has presented today for surgery, with the diagnosis of unstable angina  The various methods of treatment have been discussed with the patient and family. After consideration of risks, benefits and other options for treatment, the patient has consented to  Procedure(s): Left Heart Cath and Coronary Angiography (N/A) as a surgical intervention .  The patient's history has been reviewed, patient examined, no change in status, stable for surgery.  I have reviewed the patient's chart and labs.  Questions were answered to the patient's satisfaction.     Quay Burow

## 2014-12-08 NOTE — Progress Notes (Signed)
ANTICOAGULATION CONSULT NOTE - Follow Up Consult  Pharmacy Consult for Heparin  Indication: chest pain/ACS  Allergies  Allergen Reactions  . Ciprofloxacin Rash  . Daypro [Oxaprozin] Rash    Patient Measurements: Height: 5\' 8"  (172.7 cm) Weight: 201 lb 1.6 oz (91.218 kg) IBW/kg (Calculated) : 68.4  Vital Signs: Temp: 98.3 F (36.8 C) (10/17 0750) Temp Source: Oral (10/17 0750) BP: 100/56 mmHg (10/17 0750) Pulse Rate: 62 (10/17 0750)  Labs:  Recent Labs  12/05/14 1348 12/05/14 2055  12/06/14 0224 12/06/14 0745 12/06/14 1542 12/07/14 0340 12/08/14 0340  HGB 15.2  --   --  13.6  --   --  14.2 14.1  HCT 44.1  --   --  39.7  --   --  42.0 42.4  PLT 231  --   --  189  --   --  211 206  LABPROT 13.5  --   --   --   --   --   --   --   INR 1.01  --   --   --   --   --   --   --   HEPARINUNFRC  --   --   < >  --  0.26* 0.48 0.50 0.46  CREATININE 0.88  --   --   --   --   --   --   --   TROPONINI  --  <0.03  --  <0.03 <0.03  --   --   --   < > = values in this interval not displayed.  Estimated Creatinine Clearance: 91.7 mL/min (by C-G formula based on Cr of 0.88).  Assessment: 64 yo M presents on 10/14 with SOB and chest pain. Pharmacy consulted to dose heparin for ACS. CBC stable. No s/s of bleed. No anticoag PTA. HL remains therapeutic on 1300 units/hr.  Plans for cath today.  Goal of Therapy:  Heparin level 0.3-0.7 units/ml Monitor platelets by anticoagulation protocol: Yes   Plan:  Continue heparin at 1300 units/hr Follow-up after cath. Monitor daily HL, CBC, s/s of bleed  [employer], Pharm.D., BCPS Clinical Pharmacist Pager [phone] 12/08/2014 10:43 AM

## 2014-12-08 NOTE — Care Management Important Message (Signed)
Important Message  Patient Details  Name: Ray Brown MRN: [id number] Date of Birth: 1950/08/12   Medicare Important Message Given:  Yes-second notification given    Nathen May 12/08/2014, 12:00 PM

## 2014-12-08 NOTE — Discharge Instructions (Signed)

## 2014-12-08 NOTE — Care Management Note (Signed)
Case Management Note  Patient Details  Name: Ray Brown MRN: [id number] Date of Birth: 25-Aug-1950  Subjective/Objective: Pt admitted for Unstable Angina. Initiated on IV heparin and nitro gtt. Plan for cardiac cath 12-08-14.                   Action/Plan: CM will continue to monitor for disposition needs.    Expected Discharge Date:                  Expected Discharge Plan:  Home/Self Care  In-House Referral:     Discharge planning Services  CM Consult  Post Acute Care Choice:    Choice offered to:     DME Arranged:    DME Agency:     HH Arranged:    HH Agency:     Status of Service:  In process, will continue to follow  Medicare Important Message Given:    Date Medicare IM Given:    Medicare IM give by:    Date Additional Medicare IM Given:    Additional Medicare Important Message give by:     If discussed at [place] of Stay Meetings, dates discussed:    Additional Comments:  Bethena Roys, RN 12/08/2014, 11:46 AM

## 2014-12-08 NOTE — Progress Notes (Signed)
UR Completed Ray Bridgers Graves-Bigelow, RN,BSN [phone]  

## 2014-12-08 NOTE — Progress Notes (Signed)
Patient Name: Ray Brown Date of Encounter: 12/08/2014  Principal Problem:   Unstable angina [facility]) Active Problems:   Coronary artery disease   Peripheral vascular disease ([place])   Hyperlipemia   Diabetes mellitus with peripheral artery disease [facility])   Dyspnea   Dizziness   Primary Cardiologist: Dr [name]  Patient Profile: 64 yo male w/ hx DES RCA '08, ISR rx w/ DES '09, HTN, HL, DM, AoBifem '03, R iliac stent, OSA (no CPAP), abnl MV 2012 w/ no critical CAD at cath. Admitted 10/14 w/ chest pain and SOB, ez neg, for cath 10/17  SUBJECTIVE: No chest pain, no SOB, a little impatient with waiting 4 days for procedure, hopes it gets done soon.  OBJECTIVE Filed Vitals:   12/07/14 2100 12/08/14 0028 12/08/14 0400 12/08/14 0750  BP: 110/58 106/61 124/62 100/56  Pulse: 70 68 97 62  Temp: 98.6 F (37 C) 98 F (36.7 C) 97.8 F (36.6 C) 98.3 F (36.8 C)  TempSrc: Oral Oral Oral Oral  Resp: 20   18  Height:      Weight:   201 lb 1.6 oz (91.218 kg)   SpO2: 94% 94% 90% 97%    Intake/Output Summary (Last 24 hours) at 12/08/14 0831 Last data filed at 12/08/14 0800  Gross per 24 hour  Intake 965.13 ml  Output    800 ml  Net 165.13 ml   Filed Weights   12/05/14 2019 12/07/14 0400 12/08/14 0400  Weight: 201 lb 4.8 oz (91.309 kg) 201 lb 14.4 oz (91.581 kg) 201 lb 1.6 oz (91.218 kg)    PHYSICAL EXAM General: Well developed, well nourished, male in no acute distress. Head: Normocephalic, atraumatic.  Neck: Supple without bruits, JVD not elevated. Lungs:  Resp regular and unlabored, CTA. Heart: RRR, S1, S2, no S3, S4, or murmur; no rub. Abdomen: Soft, non-tender, non-distended, BS + x 4.  Extremities: No clubbing, cyanosis, edema. Pulses intact Neuro: Alert and oriented X 3. Moves all extremities spontaneously. Psych: Normal affect.  LABS: CBC: Recent Labs  12/07/14 0340 12/08/14 0340  WBC 8.5 7.4  HGB 14.2 14.1  HCT 42.0 42.4  MCV 92.7 92.6  PLT 211 206    INR: Recent Labs  12/05/14 1348  INR 8.26   Basic Metabolic Panel: Recent Labs  12/05/14 1348  NA 137  K 4.1  CL 104  CO2 26  GLUCOSE 110*  BUN 14  CREATININE 0.88  CALCIUM 9.3   Cardiac Enzymes: Recent Labs  12/05/14 2055 12/06/14 0224 12/06/14 0745  TROPONINI <0.03 <0.03 <0.03    Recent Labs  12/05/14 1401  TROPIPOC 0.01   BNP:  B NATRIURETIC PEPTIDE  Date/Time Value Ref Range Status  12/05/2014 01:48 PM 15.7 0.0 - 100.0 pg/mL Final   TELE:   SR, no sig ectopy     Current Medications:  . aspirin EC  81 mg Oral QHS  . atorvastatin  80 mg Oral q1800  . fenofibrate  160 mg Oral QHS  . metoprolol tartrate  12.5 mg Oral BID  . niacin  1,500 mg Oral QHS  . pantoprazole  40 mg Oral QHS  . sodium chloride  3 mL Intravenous Q12H   . sodium chloride 1 mL/kg/hr (12/08/14 0610)  . heparin 1,300 Units/hr (12/08/14 0630)  . nitroGLYCERIN 5 mcg/min (12/05/14 1842)    ASSESSMENT AND PLAN: Principal Problem:   Unstable angina ([place]) - for cath today, further evaluation and treatment based on results - continue ASA, BB,  Nitrates, statin, heparin  Otherwise, continue current therapy Active Problems:   Coronary artery disease   Peripheral vascular disease ([place])   Hyperlipemia   Diabetes mellitus with peripheral artery disease ([facility])   Dyspnea   Dizziness   Signed, Barrett, Rhonda , PA-C 8:31 AM 12/08/2014 Painfree over the weekend on IV heparin and IV NTG. Rhythm stable NSR. Awaiting cath today. Lungs clear. Heart no gallop. Agree with assessment and plan above.

## 2014-12-08 NOTE — Discharge Summary (Signed)
CARDIOLOGY DISCHARGE SUMMARY   Patient ID: Ray Brown MRN: [id number] DOB/AGE: 65-10-1950 65 y.o.  Admit date: 12/05/2014 Discharge date: 12/08/2014  PCP: [name], MD Primary Cardiologist: Dr. [name]  Primary Discharge Diagnosis:  Unstable angina Secondary Discharge Diagnosis:    Coronary artery disease   Peripheral vascular disease ([place])   Hyperlipemia   Diabetes mellitus with peripheral artery disease ([place])   Dyspnea   Dizziness  Procedures: Cardiac catheterization, coronary arteriogram, left ventriculogram  Hospital Course: Ray Brown is a 64 y.o. male with a history of DES to the RCA 2008, ISR to the RCA with a second stent placed, hypertension, diabetes, hyperlipidemia, aortobifem 2003, right iliac stent and obstructive sleep apnea not on CPAP. He woke with chest pain on the day of admission and came to the hospital where he was admitted for further evaluation and treatment.  His cardiac enzymes were negative for MI. He was continued on aspirin and his home dose of statin. A low-dose beta blocker was added to the medication regimen and he tolerated this well. His pain was concerning for unstable anginal pain and cardiac catheterization was scheduled. He was taken to the cath lab on 12/08/2014.  Cardiac catheterization results from below. He had no in-stent restenosis in the RCA stent and his EF was preserved with no wall motion abnormalities.  Post--cath, he was ambulating without chest pain or shortness of breath. No further inpatient workup is indicated and he is considered stable for discharge, to follow up as an outpatient.  Labs:   Lab Results  Component Value Date   WBC 7.4 12/08/2014   HGB 14.1 12/08/2014   HCT 42.4 12/08/2014   MCV 92.6 12/08/2014   PLT 206 12/08/2014     Recent Labs Lab 12/05/14 1348  NA 137  K 4.1  CL 104  CO2 26  BUN 14  CREATININE 0.88  CALCIUM 9.3  GLUCOSE 110*    Recent Labs  12/05/14 2055  12/06/14 0224 12/06/14 0745  TROPONINI <0.03 <0.03 <0.03   B NATRIURETIC PEPTIDE  Date/Time Value Ref Range Status  12/05/2014 01:48 PM 15.7 0.0 - 100.0 pg/mL Final     Radiology: Dg Chest 2 View 12/05/2014  CLINICAL DATA:  Chest pain and shortness of breath for 1 day. EXAM: CHEST - 2 VIEW COMPARISON:  Two-view chest x-ray 12/25/2013. FINDINGS: The heart size is normal. The lungs are clear. Mild chronic interstitial coarsening is stable. No focal airspace disease is present. There is no edema or effusion to suggest failure. The visualized soft tissues and bony thorax are unremarkable. IMPRESSION: 1. No acute cardiopulmonary disease or significant interval change. Electronically Signed   By: San Morelle M.D.   On: 12/05/2014 14:57    Cardiac Cath: 12/08/2014        Left Ventricle The left ventricular size is normal. The left ventricular systolic function is normal. The left ventricular ejection fraction is 55-65% by visual estimate. There are no wall motion abnormalities in the left ventricle.  EKG: 12/07/2014 Sinus rhythm, no acute ischemic changes  Echo: 12/07/2014 Conclusions - Left ventricle: The cavity size was normal. Wall thickness was normal. Systolic function was normal. The estimated ejection fraction was in the range of 55% to 60%.  FOLLOW UP PLANS AND APPOINTMENTS Allergies  Allergen Reactions  . Ciprofloxacin Rash  . Daypro [Oxaprozin] Rash     Medication List    TAKE these medications        aspirin EC 81 MG tablet  Take 81 mg by mouth at bedtime.     atorvastatin 80 MG tablet  Commonly known as:  LIPITOR  Take 1 tablet (80 mg total) by mouth daily at 6 PM.     clopidogrel 75 MG tablet  Commonly known as:  PLAVIX  Take 1 tablet by mouth at  bedtime     fenofibrate 160 MG tablet  Take 160 mg by mouth at bedtime.     metoprolol succinate 25 MG 24 hr tablet  Commonly known as:  TOPROL XL  Take 1 tablet (25 mg total) by mouth daily.      niacin 750 MG CR tablet  Commonly known as:  NIASPAN  Take 1,500 mg by mouth at bedtime.     nitroGLYCERIN 0.4 MG SL tablet  Commonly known as:  NITROSTAT  Place 0.4 mg under the tongue every 5 (five) minutes as needed. For chest pain     pantoprazole 40 MG tablet  Commonly known as:  PROTONIX  Take 40 mg by mouth at bedtime.     traMADol 100 MG 24 hr tablet  Commonly known as:  ULTRAM-ER  TAKE 1 TABLET EVERY DAY AS NEEDED        Discharge Instructions    Diet - low sodium heart healthy    Complete by:  As directed      Increase activity slowly    Complete by:  As directed           Follow-up Information    Follow up with Candee Furbish, MD.   Specialty:  Cardiology   Why:  The office will call.    Contact information:   [address] [address] [phone]       BRING ALL MEDICATIONS WITH YOU TO FOLLOW UP APPOINTMENTS  Time spent with patient to include physician time: 44 min Signed: Rosaria Ferries, PA-C 12/08/2014, 3:27 PM Co-Sign MD

## 2014-12-10 ENCOUNTER — Telehealth: Payer: Self-pay | Admitting: Cardiology

## 2014-12-10 NOTE — Telephone Encounter (Signed)
New message      Pt was in the hosp over the weekend.  He was discharged on metoprolol.  Pt said Dr [name] took him off this medication a while ago.  Should he take it again?  Please call

## 2014-12-10 NOTE — Telephone Encounter (Signed)
Informed the pt that I reviewed his hospital progress note and discharge summary from [facility] PA-C, and per Suanne Marker the pt was given a low dose beta blocker during the course of his hospital stay, and noted he tolerated this well.  Informed the pt that they then discharged him on this medication to continue to take.  Informed the pt that being he tolerated Metoprolol well in the hospital, he should continue taking this on discharge.  Pt verbalized understanding and agrees with this plan.

## 2015-01-13 ENCOUNTER — Encounter: Payer: Self-pay | Admitting: Vascular Surgery

## 2015-01-20 ENCOUNTER — Encounter (HOSPITAL_COMMUNITY): Payer: Commercial Managed Care - HMO

## 2015-01-20 ENCOUNTER — Encounter: Payer: Commercial Managed Care - HMO | Admitting: Vascular Surgery

## 2015-02-19 ENCOUNTER — Telehealth: Payer: Self-pay | Admitting: *Deleted

## 2015-02-19 NOTE — Telephone Encounter (Signed)
wanted metoprolol moved from [facility] to [facility], informed pt that [facility] can request that for him, he expressed understanding.

## 2015-03-01 ENCOUNTER — Other Ambulatory Visit: Payer: Self-pay | Admitting: Cardiology

## 2015-03-10 ENCOUNTER — Encounter: Payer: Self-pay | Admitting: Vascular Surgery

## 2015-03-17 ENCOUNTER — Ambulatory Visit (INDEPENDENT_AMBULATORY_CARE_PROVIDER_SITE_OTHER): Payer: Commercial Managed Care - HMO | Admitting: Vascular Surgery

## 2015-03-17 ENCOUNTER — Ambulatory Visit (HOSPITAL_COMMUNITY)
Admission: RE | Admit: 2015-03-17 | Discharge: 2015-03-17 | Disposition: A | Discharge status: Home / Self Care | Payer: Commercial Managed Care - HMO | Source: Ambulatory Visit | Attending: Vascular Surgery | Admitting: Vascular Surgery

## 2015-03-17 ENCOUNTER — Encounter: Payer: Self-pay | Admitting: Vascular Surgery

## 2015-03-17 VITALS — BP 125/78 | HR 76 | Ht 68.0 in | Wt 207.0 lb

## 2015-03-17 DIAGNOSIS — M48062 Spinal stenosis, lumbar region with neurogenic claudication: Secondary | ICD-10-CM

## 2015-03-17 DIAGNOSIS — E785 Hyperlipidemia, unspecified: Secondary | ICD-10-CM | POA: Insufficient documentation

## 2015-03-17 DIAGNOSIS — M4806 Spinal stenosis, lumbar region: Secondary | ICD-10-CM

## 2015-03-17 DIAGNOSIS — I739 Peripheral vascular disease, unspecified: Secondary | ICD-10-CM | POA: Insufficient documentation

## 2015-03-17 DIAGNOSIS — E119 Type 2 diabetes mellitus without complications: Secondary | ICD-10-CM | POA: Diagnosis not present

## 2015-03-17 NOTE — Progress Notes (Signed)
[facility] [place]  Patient name: Ray Brown MRN: [id number] DOB: [birth year] Sex: male  REASON FOR CONSULT: Hip and buttock claudication  HPI: Ray Brown is a 65 y.o. male, who is known to me from prior aortobifemoral bypass in 2003. He has done very well since his surgery. He reports that he does have great deal difficulty with chronic low back discomfort. He reports that over the last 1-2 years he has had episodes of pain specifically in his hip joints bilaterally extending into his lateral thighs bilaterally. This only occurs with walking and is worse if he is carrying something or if he is walking uphill. This is relieved with a short amount of rest. Lower extremity tissue loss. He has no claudication symptoms below his knee. He does have a great deal of calf cramping at night. Does have history of coronary artery disease which is stable currently.  Past Medical History  Diagnosis Date  . Coronary artery disease   . GERD (gastroesophageal reflux disease)   . Bronchitis   . H/O hiatal hernia   . Arthritis   . Hypercholesteremia   . Dry skin     in the back of head-left side  . Chronic back pain   . Complication of anesthesia     difficulty breathing s/p back surgery [facility] 2013  . Pneumonia   . Shortness of breath   . Sleep apnea     has CPAP but does not use  . Peripheral vascular disease ([place])   . Diabetes mellitus without complication ([place])     Family History  Problem Relation Age of Onset  . Colon cancer Mother   . Heart attack Father     , massive  . Coronary artery disease Brother   . Thyroid cancer Brother     SOCIAL HISTORY: Social History   Social History  . Marital Status: Married    Spouse Name: N/A  . Number of Children: N/A  . Years of Education: N/A   Occupational History  . Not on file.   Social History Main Topics  . Smoking status: Former Smoker -- 35 years    Types: Cigarettes  . Smokeless tobacco: Not on  file  . Alcohol Use: No  . Drug Use: No  . Sexual Activity: Not on file   Other Topics Concern  . Not on file   Social History Narrative    Allergies  Allergen Reactions  . Ciprofloxacin Rash  . Daypro [Oxaprozin] Rash    Current Outpatient Prescriptions  Medication Sig Dispense Refill  . aspirin EC 81 MG tablet Take 81 mg by mouth at bedtime.    Marland Kitchen atorvastatin (LIPITOR) 80 MG tablet Take 1 tablet (80 mg total) by mouth daily at 6 PM. 90 tablet 3  . clopidogrel (PLAVIX) 75 MG tablet Take 1 tablet by mouth at  bedtime 90 tablet 1  . fenofibrate 160 MG tablet Take 160 mg by mouth at bedtime.    . metoprolol succinate (TOPROL XL) 25 MG 24 hr tablet Take 1 tablet (25 mg total) by mouth daily. 30 tablet 11  . niacin (NIASPAN) 750 MG CR tablet Take 1,500 mg by mouth at bedtime.    . nitroGLYCERIN (NITROSTAT) 0.4 MG SL tablet Place 0.4 mg under the tongue every 5 (five) minutes as needed. For chest pain    . pantoprazole (PROTONIX) 40 MG tablet Take 40 mg by mouth at bedtime.    . traMADol (ULTRAM-ER) 100  MG 24 hr tablet TAKE 1 TABLET EVERY DAY AS NEEDED  0   No current facility-administered medications for this visit.    REVIEW OF SYSTEMS:  [X]  denotes positive finding, [ ]  denotes negative finding Cardiac  Comments:  Chest pain or chest pressure:    Shortness of breath upon exertion:    Short of breath when lying flat:    Irregular heart rhythm:        Vascular    Pain in calf, thigh, or hip brought on by ambulation: x   Pain in feet at night that wakes you up from your sleep:  x   Blood clot in your veins:    Leg swelling:         Pulmonary    Oxygen at home:    Productive cough:     Wheezing:         Neurologic    Sudden weakness in arms or legs:     Sudden numbness in arms or legs:     Sudden onset of difficulty speaking or slurred speech:    Temporary loss of vision in one eye:     Problems with dizziness:         Gastrointestinal    Blood in stool:       Vomited blood:         Genitourinary    Burning when urinating:     Blood in urine:        Psychiatric    Major depression:         Hematologic    Bleeding problems:    Problems with blood clotting too easily:        Skin    Rashes or ulcers:        Constitutional    Fever or chills:      PHYSICAL EXAM: Filed Vitals:   03/17/15 0839  BP: 125/78  Pulse: 76  Height: 5\' 8"  (1.727 m)  Weight: 207 lb (93.895 kg)  SpO2: 97%    GENERAL: The patient is a well-nourished male, in no acute distress. The vital signs are documented above. CARDIAC: There is a regular rate and rhythm.  VASCULAR: 2+ radial pulses bilaterally 2+ femoral graft pulses bilaterally, 2+ dorsalis pedis pulses bilaterally PULMONARY: There is good air exchange bilaterally without wheezing or rales. ABDOMEN: Soft and non-tender with normal pitched bowel sounds. Well-healed abdominal incision with no hernias present MUSCULOSKELETAL: There are no major deformities or cyanosis. NEUROLOGIC: No focal weakness or paresthesias are detected. SKIN: There are no ulcers or rashes noted. PSYCHIATRIC: The patient has a normal affect.  DATA:  He will underwent noninvasive vascular laboratory studies are office today. This reveals completely normal triphasic waveforms in his posterior tibial and dorsalis pedis pulses bilaterally his ankle arm index is also normal at greater than 1.0 bilaterally  He did have a CT angiogram approximately 1-1/2 years ago and I reviewed this as well. This does show patency of his aortobifemoral bypass. He does have flow into his pelvis the internal iliac collaterals   MEDICAL ISSUES: Had long discussion with patient regarding this. He has completely normal flow through his aortofemoral bypass from 14 years ago. He does have no evidence of lower from the arterial insufficiency below this with normal noninvasive studies. I explained that this should rule out arterial insufficiency as the cause of  his claudication. I did explain the occasional finding of diminished flow to the internal iliac system. Explained that we could evaluate this  further with catheter-based arteriogram but that it would be unlikely that this was the cause of his symptoms and even if concern was identified be difficult to treat. I have suggested that he see Dr.Cram again for evaluation of his lumbar spine rule this out as a potential calls. If he continues to have significant discomfort he will notify us if all other workup is negative to further discuss arteriography for further evaluation. He understands this is not limb threatening and that he is completely safe to continue walking despite the discomfort   Jacqualin Shirkey [facility] [employer]: [phone]

## 2015-03-19 ENCOUNTER — Telehealth: Payer: Self-pay | Admitting: Vascular Surgery

## 2015-03-19 NOTE — Telephone Encounter (Signed)
I spoke with Ray Brown. He is scheduled to see Dr Saintclair Halsted on Tuesday 03/24/15 @ 10:00am.  dpm

## 2015-03-24 DIAGNOSIS — M5137 Other intervertebral disc degeneration, lumbosacral region: Secondary | ICD-10-CM | POA: Diagnosis not present

## 2015-03-24 DIAGNOSIS — M5126 Other intervertebral disc displacement, lumbar region: Secondary | ICD-10-CM | POA: Diagnosis not present

## 2015-03-25 DIAGNOSIS — M5137 Other intervertebral disc degeneration, lumbosacral region: Secondary | ICD-10-CM | POA: Diagnosis not present

## 2015-03-27 DIAGNOSIS — M4806 Spinal stenosis, lumbar region: Secondary | ICD-10-CM | POA: Diagnosis not present

## 2015-03-31 DIAGNOSIS — M4806 Spinal stenosis, lumbar region: Secondary | ICD-10-CM | POA: Diagnosis not present

## 2015-03-31 DIAGNOSIS — M5137 Other intervertebral disc degeneration, lumbosacral region: Secondary | ICD-10-CM | POA: Diagnosis not present

## 2015-04-14 DIAGNOSIS — M47816 Spondylosis without myelopathy or radiculopathy, lumbar region: Secondary | ICD-10-CM | POA: Diagnosis not present

## 2015-04-14 DIAGNOSIS — Z6831 Body mass index (BMI) 31.0-31.9, adult: Secondary | ICD-10-CM | POA: Diagnosis not present

## 2015-04-21 DIAGNOSIS — J988 Other specified respiratory disorders: Secondary | ICD-10-CM | POA: Diagnosis not present

## 2015-04-28 DIAGNOSIS — E782 Mixed hyperlipidemia: Secondary | ICD-10-CM | POA: Diagnosis not present

## 2015-04-28 DIAGNOSIS — I119 Hypertensive heart disease without heart failure: Secondary | ICD-10-CM | POA: Diagnosis not present

## 2015-04-28 DIAGNOSIS — M1219 Kaschin-Beck disease, multiple sites: Secondary | ICD-10-CM | POA: Diagnosis not present

## 2015-04-28 DIAGNOSIS — E1142 Type 2 diabetes mellitus with diabetic polyneuropathy: Secondary | ICD-10-CM | POA: Diagnosis not present

## 2015-04-28 DIAGNOSIS — I739 Peripheral vascular disease, unspecified: Secondary | ICD-10-CM | POA: Diagnosis not present

## 2015-04-28 DIAGNOSIS — K2270 Barrett's esophagus without dysplasia: Secondary | ICD-10-CM | POA: Diagnosis not present

## 2015-04-28 DIAGNOSIS — Z125 Encounter for screening for malignant neoplasm of prostate: Secondary | ICD-10-CM | POA: Diagnosis not present

## 2015-04-28 DIAGNOSIS — Z0000 Encounter for general adult medical examination without abnormal findings: Secondary | ICD-10-CM | POA: Diagnosis not present

## 2015-04-28 DIAGNOSIS — E669 Obesity, unspecified: Secondary | ICD-10-CM | POA: Diagnosis not present

## 2015-04-28 DIAGNOSIS — I700 Atherosclerosis of aorta: Secondary | ICD-10-CM | POA: Diagnosis not present

## 2015-04-28 DIAGNOSIS — I25119 Atherosclerotic heart disease of native coronary artery with unspecified angina pectoris: Secondary | ICD-10-CM | POA: Diagnosis not present

## 2015-04-28 DIAGNOSIS — E1151 Type 2 diabetes mellitus with diabetic peripheral angiopathy without gangrene: Secondary | ICD-10-CM | POA: Diagnosis not present

## 2015-04-30 ENCOUNTER — Encounter: Payer: Self-pay | Admitting: Cardiology

## 2015-04-30 DIAGNOSIS — M5137 Other intervertebral disc degeneration, lumbosacral region: Secondary | ICD-10-CM | POA: Diagnosis not present

## 2015-05-12 DIAGNOSIS — B349 Viral infection, unspecified: Secondary | ICD-10-CM | POA: Diagnosis not present

## 2015-05-18 DIAGNOSIS — H2513 Age-related nuclear cataract, bilateral: Secondary | ICD-10-CM | POA: Diagnosis not present

## 2015-05-18 DIAGNOSIS — E119 Type 2 diabetes mellitus without complications: Secondary | ICD-10-CM | POA: Diagnosis not present

## 2015-05-19 DIAGNOSIS — M47816 Spondylosis without myelopathy or radiculopathy, lumbar region: Secondary | ICD-10-CM | POA: Diagnosis not present

## 2015-05-19 DIAGNOSIS — Z6831 Body mass index (BMI) 31.0-31.9, adult: Secondary | ICD-10-CM | POA: Diagnosis not present

## 2015-05-25 ENCOUNTER — Ambulatory Visit (INDEPENDENT_AMBULATORY_CARE_PROVIDER_SITE_OTHER): Payer: Commercial Managed Care - HMO | Admitting: Cardiology

## 2015-05-25 ENCOUNTER — Encounter: Payer: Self-pay | Admitting: Cardiology

## 2015-05-25 VITALS — BP 114/70 | HR 98 | Ht 68.0 in | Wt 200.6 lb

## 2015-05-25 DIAGNOSIS — E7800 Pure hypercholesterolemia, unspecified: Secondary | ICD-10-CM

## 2015-05-25 DIAGNOSIS — I2510 Atherosclerotic heart disease of native coronary artery without angina pectoris: Secondary | ICD-10-CM | POA: Diagnosis not present

## 2015-05-25 DIAGNOSIS — E669 Obesity, unspecified: Secondary | ICD-10-CM

## 2015-05-25 DIAGNOSIS — I739 Peripheral vascular disease, unspecified: Secondary | ICD-10-CM | POA: Diagnosis not present

## 2015-05-25 MED ORDER — METOPROLOL SUCCINATE ER 25 MG PO TB24: 25.0000 mg | ORAL_TABLET | Freq: Every day | ORAL | Status: DC

## 2015-05-25 NOTE — Patient Instructions (Signed)

## 2015-05-25 NOTE — Progress Notes (Signed)
[facility]. [address], [address] [place], [place]  [address] Phone: [phone] Fax:  [phone]  Date:  05/25/2015   ID:  Ray Brown, DOB May 09, 1950, MRN [id number]  PCP:  [name], MD   History of Present Illness: Ray Brown is a 65 y.o. male with coronary artery disease drug eluting stent in July 2010, bare-metal stent and RCA August 08 (had in-stent restenosis) with hypertension, hyperlipidemia, aortobifemoral bypass in 05-24-2001 Dr. Donnetta Hutching, sleep apnea not on CPAP here he here for followup.  10/2010-cardiac catheterization showing patent RCA stents after nuclear stress test showed inferior wall ischemia. Overall reassuring.  Prior right shoulder surgery surgery risk stratification. Had prior left shoulder surgery. No CP no SOB. Shoulder pain. No claudication. +Hip pain, cortisone. Hip pain is lateral. Saw Dr. Saintclair Halsted - injections.   Right hip pain, hard to get up in the morning. Both hips hurt with exertion. Tramadol does not seem to help.   Hyperlipidemia-excellently controlled. See below  Mother died after surgery. 05/25/14  If walking hip pain, back pain.   Right iliac stent (the way he describes his procedure) as well as aortobifemoral bypass. Dr. Donnetta Hutching.   Viral URI - cough, vomiting.  He has lost 7 pounds. His grandkids were sick. Strep throat, flu. His wife got the flu. He states that he does not have the flu. He has been battling this cough for quite some time. He is working with Dr. Brigitte Pulse. No chest pain, syncope, orthopnea. Unfortunately lost his mom after hip fracture in the hospital.  Wt Readings from Last 3 Encounters:  05/25/15 200 lb 9.6 oz (90.992 kg)  03/17/15 207 lb (93.895 kg)  12/08/14 201 lb 1.6 oz (91.218 kg)     Past Medical History  Diagnosis Date  . Coronary artery disease   . GERD (gastroesophageal reflux disease)   . Bronchitis   . H/O hiatal hernia   . Arthritis   . Hypercholesteremia   . Dry skin     in the back of head-left side  .  Chronic back pain   . Complication of anesthesia     difficulty breathing s/p back surgery [facility] 2011/[facility]/25  . Pneumonia   . Shortness of breath   . Sleep apnea     has CPAP but does not use  . Peripheral vascular disease ([place])   . Diabetes mellitus without complication [facility])     Past Surgical History  Procedure Laterality Date  . Foot surgery  1980s    right foot  . Back surgery  05/25/2002  . Aortic bypass  05-24-01    aortobifemoral bypass  . Cardiac catheterization  May 24, 2008    x 2 stents RCA  . Shoulder surgery      left shoulder rotator cuff repair  . Colonoscopy  08/04/2009  . Coronary angioplasty      2 stents  . Upper gi endoscopy    . Shoulder arthroscopy with subacromial decompression Right 03/28/2013    Procedure: RIGHT SHOULDER ARTHROSCOPY WITH SUBACROMIAL DECOMPRESSION AND DISTAL CLAVICLE RESECTION;  Surgeon: Marin Shutter, MD;  Location: [facility];  Service: Orthopedics;  Laterality: Right;  . Cardiac catheterization N/A 12/08/2014    Procedure: Left Heart Cath and Coronary Angiography;  Surgeon: Lorretta Harp, MD;  Location: [facility];  Service: Cardiovascular;  Laterality: N/A;    Current Outpatient Prescriptions  Medication Sig Dispense Refill  . aspirin EC 81 MG tablet Take 81 mg by mouth at  bedtime.    Marland Kitchen atorvastatin (LIPITOR) 80 MG tablet Take 1 tablet (80 mg total) by mouth daily at 6 PM. 90 tablet 3  . clopidogrel (PLAVIX) 75 MG tablet Take 1 tablet by mouth at  bedtime 90 tablet 1  . fenofibrate 160 MG tablet Take 160 mg by mouth at bedtime.    . metoprolol succinate (TOPROL XL) 25 MG 24 hr tablet Take 1 tablet (25 mg total) by mouth daily. 30 tablet 11  . niacin (NIASPAN) 750 MG CR tablet Take 1,500 mg by mouth at bedtime.    . nitroGLYCERIN (NITROSTAT) 0.4 MG SL tablet Place 0.4 mg under the tongue every 5 (five) minutes as needed. For chest pain    . pantoprazole (PROTONIX) 40 MG tablet Take 40 mg by mouth at bedtime.    . traMADol (ULTRAM-ER) 100 MG 24 hr  tablet TAKE 1 TABLET EVERY DAY AS NEEDED for pain  0   No current facility-administered medications for this visit.    Allergies:    Allergies  Allergen Reactions  . Ciprofloxacin Rash  . Daypro [Oxaprozin] Rash    Social History:  The patient  reports that he has quit smoking. His smoking use included Cigarettes. He quit after 35 years of use. He does not have any smokeless tobacco history on file. He reports that he does not drink alcohol or use illicit drugs.   ROS:  Please see the history of present illness.   Denies any bleeding, syncope, orthopnea, PND    PHYSICAL EXAM: VS:  BP 114/70 mmHg  Pulse 98  Ht 5\' 8"  (1.727 m)  Wt 200 lb 9.6 oz (90.992 kg)  BMI 30.51 kg/m2 Well nourished, well developed, in no acute distress HEENT: normal Neck: no JVD Cardiac:  normal S1, S2; RRR; no murmur Lungs:  clear to auscultation bilaterally, no wheezing, rhonchi or rales Abd: soft, nontender, no hepatomegalyOverweight Ext: no edemaPalpable distal pulses bilaterally dorsalis pedis Skin: warm and dry Neuro: no focal abnormalities noted  EKG: 11/13/14-sinus rhythm, no other abnormalities, 77 bpm. Personally viewed-prior 10/04/13: Sinus rhythm rate 69 with no other significant abnormalities.   No change from prior.   ASSESSMENT AND PLAN:  1. Coronary artery disease-Last cardiac catheterization in 2012 showed patent right coronary artery stents. Overall he is having no active anginal symptoms. No arrhythmias. No signs of heart failure. Aggressive prevention. 2. Peripheral vascular disease-Dr. Donnetta Hutching. This is why he is on both Plavix as well as aspirin. Continue with exercise. Dr. [name] note reviewed. Conservative management. 3. Obesity-BMI is currently greater than 30. Encourage weight loss. This will help with borderline diabetes as well. He is down about 7 pounds since having his upper respiratory infection. 4. Hyperlipidemia-previously, we try to decrease his atorvastatin to 40 mg however  his LDL increased greater than 70. We are continuing with the 80 mg atorvastatin. We also discussed changing his Niaspan, fibrate. He has been on these for years and at one point he struggles rise were greater than 500. His mother also had hypertriglyceridemia. He would rather keep his current medication regimen. Seems reasonable. Discussed lack of MI reduction with those other auxiliary medications. Dr. Brigitte Pulse has been monitoring. 5. Diabetes - Hg 6.6 down to 6. Diet control.  6. We will see him back in approximately 12 months.  Signed, Candee Furbish, MD [facility]  05/25/2015 9:09 AM

## 2015-06-03 ENCOUNTER — Emergency Department (HOSPITAL_COMMUNITY)
Admission: EM | Admit: 2015-06-03 | Discharge: 2015-06-03 | Disposition: A | Discharge status: Home / Self Care | Payer: Commercial Managed Care - HMO | Attending: Emergency Medicine | Admitting: Emergency Medicine

## 2015-06-03 ENCOUNTER — Emergency Department (HOSPITAL_COMMUNITY): Payer: Commercial Managed Care - HMO

## 2015-06-03 ENCOUNTER — Encounter (HOSPITAL_COMMUNITY): Payer: Self-pay | Admitting: Family Medicine

## 2015-06-03 DIAGNOSIS — Z872 Personal history of diseases of the skin and subcutaneous tissue: Secondary | ICD-10-CM | POA: Insufficient documentation

## 2015-06-03 DIAGNOSIS — M5412 Radiculopathy, cervical region: Secondary | ICD-10-CM | POA: Insufficient documentation

## 2015-06-03 DIAGNOSIS — E7800 Pure hypercholesterolemia, unspecified: Secondary | ICD-10-CM | POA: Insufficient documentation

## 2015-06-03 DIAGNOSIS — I2510 Atherosclerotic heart disease of native coronary artery without angina pectoris: Secondary | ICD-10-CM | POA: Insufficient documentation

## 2015-06-03 DIAGNOSIS — Z7902 Long term (current) use of antithrombotics/antiplatelets: Secondary | ICD-10-CM | POA: Diagnosis not present

## 2015-06-03 DIAGNOSIS — G8929 Other chronic pain: Secondary | ICD-10-CM | POA: Insufficient documentation

## 2015-06-03 DIAGNOSIS — Z7982 Long term (current) use of aspirin: Secondary | ICD-10-CM | POA: Diagnosis not present

## 2015-06-03 DIAGNOSIS — Z8709 Personal history of other diseases of the respiratory system: Secondary | ICD-10-CM | POA: Insufficient documentation

## 2015-06-03 DIAGNOSIS — K219 Gastro-esophageal reflux disease without esophagitis: Secondary | ICD-10-CM | POA: Diagnosis not present

## 2015-06-03 DIAGNOSIS — M542 Cervicalgia: Secondary | ICD-10-CM | POA: Diagnosis present

## 2015-06-03 DIAGNOSIS — Z9889 Other specified postprocedural states: Secondary | ICD-10-CM | POA: Diagnosis not present

## 2015-06-03 DIAGNOSIS — I739 Peripheral vascular disease, unspecified: Secondary | ICD-10-CM | POA: Diagnosis not present

## 2015-06-03 DIAGNOSIS — Z8701 Personal history of pneumonia (recurrent): Secondary | ICD-10-CM | POA: Insufficient documentation

## 2015-06-03 DIAGNOSIS — Z87891 Personal history of nicotine dependence: Secondary | ICD-10-CM | POA: Diagnosis not present

## 2015-06-03 DIAGNOSIS — E119 Type 2 diabetes mellitus without complications: Secondary | ICD-10-CM | POA: Insufficient documentation

## 2015-06-03 DIAGNOSIS — Z79899 Other long term (current) drug therapy: Secondary | ICD-10-CM | POA: Diagnosis not present

## 2015-06-03 DIAGNOSIS — M1990 Unspecified osteoarthritis, unspecified site: Secondary | ICD-10-CM | POA: Insufficient documentation

## 2015-06-03 LAB — I-STAT TROPONIN, ED: TROPONIN I, POC: 0 ng/mL (ref 0.00–0.08)

## 2015-06-03 LAB — BASIC METABOLIC PANEL
Anion gap: 12 (ref 5–15)
BUN: 10 mg/dL (ref 6–20)
CALCIUM: 9.2 mg/dL (ref 8.9–10.3)
CO2: 19 mmol/L — AB (ref 22–32)
CREATININE: 0.9 mg/dL (ref 0.61–1.24)
Chloride: 107 mmol/L (ref 101–111)
GFR calc Af Amer: >60 mL/min (ref >60)
GLUCOSE: 237 mg/dL — AB (ref 65–99)
Potassium: 3.5 mmol/L (ref 3.5–5.1)
Sodium: 138 mmol/L (ref 135–145)

## 2015-06-03 LAB — CBC
HCT: 42.2 % (ref 39.0–52.0)
Hemoglobin: 14.7 g/dL (ref 13.0–17.0)
MCH: 31.9 pg (ref 26.0–34.0)
MCHC: 34.8 g/dL (ref 30.0–36.0)
MCV: 91.5 fL (ref 78.0–100.0)
Platelets: 231 10*3/uL (ref 150–400)
RBC: 4.61 MIL/uL (ref 4.22–5.81)
RDW: 13.5 % (ref 11.5–15.5)
WBC: 8.3 10*3/uL (ref 4.0–10.5)

## 2015-06-03 NOTE — ED Provider Notes (Signed)
CSN: [id number]     Arrival date & time 06/03/15  1217 History   First MD Initiated Contact with Patient 06/03/15 1313     Chief Complaint  Patient presents with  . Neck Pain  . Arm Pain   (Consider location/radiation/quality/duration/timing/severity/associated sxs/prior Treatment) HPI 65 y.o. male with a hx of CAD, HLD, presents to the Emergency Department today complaining of left sided neck pain that began on Monday. Pt states that he was sitting in his truck when he felt a pins and needle sensation that started on his posterior left neck with radiation to elbow. Notes pain is 10/10. No alleviating factors with OTC remedies with heating pad and aspirin. No Trauma to the area or sudden movements. No CP/SOB/ABD pain. No headaches. No vision changes. No syncope or feeling faint. Notes pain worse with movement of neck. No fevers. No other symptoms noted.   Past Medical History  Diagnosis Date  . Coronary artery disease   . GERD (gastroesophageal reflux disease)   . Bronchitis   . H/O hiatal hernia   . Arthritis   . Hypercholesteremia   . Dry skin     in the back of head-left side  . Chronic back pain   . Complication of anesthesia     difficulty breathing s/p back surgery at [facility] 2013  . Pneumonia   . Shortness of breath   . Sleep apnea     has CPAP but does not use  . Peripheral vascular disease ([place])   . Diabetes mellitus without complication [facility])    Past Surgical History  Procedure Laterality Date  . Foot surgery  1980s    right foot  . Back surgery  2004  . Aortic bypass  2003    aortobifemoral bypass  . Cardiac catheterization  2010    x 2 stents RCA  . Shoulder surgery      left shoulder rotator cuff repair  . Colonoscopy  08/04/2009  . Coronary angioplasty      2 stents  . Upper gi endoscopy    . Shoulder arthroscopy with subacromial decompression Right 03/28/2013    Procedure: RIGHT SHOULDER ARTHROSCOPY WITH SUBACROMIAL DECOMPRESSION AND DISTAL CLAVICLE RESECTION;   Surgeon: Marin Shutter, MD;  Location: [facility];  Service: Orthopedics;  Laterality: Right;  . Cardiac catheterization N/A 12/08/2014    Procedure: Left Heart Cath and Coronary Angiography;  Surgeon: Lorretta Harp, MD;  Location: [facility];  Service: Cardiovascular;  Laterality: N/A;   Family History  Problem Relation Age of Onset  . Colon cancer Mother   . Heart attack Father     , massive  . Coronary artery disease Brother   . Thyroid cancer Brother    Social History  Substance Use Topics  . Smoking status: Former Smoker -- 35 years    Types: Cigarettes  . Smokeless tobacco: None  . Alcohol Use: No    Review of Systems ROS reviewed and all are negative for acute change except as noted in the HPI.  Allergies  Ciprofloxacin and Daypro  Home Medications   Prior to Admission medications   Medication Sig Start Date End Date Taking? Authorizing Provider  aspirin EC 81 MG tablet Take 81 mg by mouth at bedtime.    Historical Provider, MD  atorvastatin (LIPITOR) 80 MG tablet Take 1 tablet (80 mg total) by mouth daily at 6 PM. 10/02/14   [name], MD  clopidogrel (PLAVIX) 75 MG tablet Take 1 tablet by  mouth at  bedtime 03/02/15   [name], MD  fenofibrate 160 MG tablet Take 160 mg by mouth at bedtime.    Historical Provider, MD  metoprolol succinate (TOPROL XL) 25 MG 24 hr tablet Take 1 tablet (25 mg total) by mouth daily. 05/25/15   [name], MD  niacin (NIASPAN) 750 MG CR tablet Take 1,500 mg by mouth at bedtime.    Historical Provider, MD  nitroGLYCERIN (NITROSTAT) 0.4 MG SL tablet Place 0.4 mg under the tongue every 5 (five) minutes as needed. For chest pain    Historical Provider, MD  pantoprazole (PROTONIX) 40 MG tablet Take 40 mg by mouth at bedtime.    Historical Provider, MD  traMADol (ULTRAM-ER) 100 MG 24 hr tablet TAKE 1 TABLET EVERY DAY AS NEEDED for pain 10/13/14   Historical Provider, MD   BP 133/76 mmHg  Pulse 93  Temp(Src) 98.2 F (36.8 C) (Oral)   Resp 18  SpO2 97%   Physical Exam  Constitutional: He is oriented to person, place, and time. He appears well-developed and well-nourished.  HENT:  Head: Normocephalic and atraumatic.  Eyes: EOM are normal. Pupils are equal, round, and reactive to light.  Neck: Trachea normal, normal range of motion and full passive range of motion without pain. Neck supple. Normal carotid pulses and no JVD present. No spinous process tenderness and no muscular tenderness present. Carotid bruit is not present. No rigidity. No erythema and normal range of motion present. No Brudzinski's sign and no Kernig's sign noted.  Cardiovascular: Normal rate, regular rhythm, normal heart sounds and intact distal pulses.   No murmur heard. Pulmonary/Chest: Breath sounds normal. No respiratory distress. He has no wheezes. He has no rales. He exhibits no tenderness.  Abdominal: Soft. There is no tenderness.  Musculoskeletal: Normal range of motion.  Neurological: He is alert and oriented to person, place, and time. He has normal strength. No cranial nerve deficit or sensory deficit.  Cranial Nerves:  II: Pupils equal, round, reactive to light III,IV, VI: ptosis not present, extra-ocular motions intact bilaterally  V,VII: smile symmetric, facial light touch sensation equal VIII: hearing grossly normal bilaterally  IX,X: midline uvula rise  XI: bilateral shoulder shrug equal and strong XII: midline tongue extension  Skin: Skin is warm and dry.  Psychiatric: He has a normal mood and affect. His behavior is normal. Thought content normal.  Nursing note and vitals reviewed.  ED Course  Procedures (including critical care time) Labs Review Labs Reviewed  BASIC METABOLIC PANEL - Abnormal; Notable for the following:    CO2 19 (*)    Glucose, Bld 237 (*)    All other components within normal limits  CBC  I-STAT TROPOININ, ED   Imaging Review Dg Chest 2 View  06/03/2015  CLINICAL DATA:  65 year old male with chest  pain radiating to the left neck and shoulder for 2 days. Initial encounter. EXAM: CHEST  2 VIEW COMPARISON:  12/05/2014 and earlier. FINDINGS: Lung volumes remain normal. Normal cardiac size and mediastinal contours. Visualized tracheal air column is within normal limits. Mildly increased interstitial markings in both lungs are stable. No pneumothorax, pleural effusion or acute pulmonary opacity. Partially visible lumbar fusion hardware. No acute osseous abnormality identified. IMPRESSION: No acute cardiopulmonary abnormality. Electronically Signed   By: Genevie Ann M.D.   On: 06/03/2015 13:28   I have personally reviewed and evaluated these images and lab results as part of my medical decision-making.   EKG Interpretation None  MDM  I have reviewed and evaluated the relevant laboratory values. I have reviewed and evaluated the relevant imaging studies. I personally evaluated and interpreted the relevant EKG. I have reviewed the relevant previous healthcare records.  I obtained HPI from historian. Patient discussed with supervising physician  ED Course:  Assessment: Pt is a 41yM with hx HLD, CAD who presents with left sided posterior neck pain. On exam, pt in NAD. Nontoxic/nonseptic appearing. VSS. Afebrile. Lungs CTA. Heart RRR. Abdomen nontender soft. Carotids with no bruits. Palpable. Labs unremarkable. Imaging unreamrkable. Previous MRI in 2007 showed disc herniation of C6-C7. Most likely radiculopathy for cervical spine vs Carotid involvement. Counseled patient on use of Naproxen and Tylenol for pain control. Pt has Tramadol at home and stated he did not need any Rx. Did not give Prednisone due to DM that is not controlled with medication.  Plan is to follow up with PCP for further management. At time of discharge, Patient is in no acute distress. Vital Signs are stable. Patient is able to ambulate. Patient able to tolerate PO.    Disposition/Plan:  DC Home Additional Verbal discharge  instructions given and discussed with patient.  Pt Instructed to f/u with Dr. Saintclair Halsted in the next week for evaluation and treatment of symptoms. Return precautions given Pt acknowledges and agrees with plan  Supervising Physician Gareth Morgan, MD   Final diagnoses:  Cervical radiculopathy       Shary Decamp, PA-C 06/03/15 1431  Gareth Morgan, MD 06/04/15 1113

## 2015-06-03 NOTE — Discharge Instructions (Signed)
Please read and follow all provided instructions.  Your diagnoses today include:  1. Cervical radiculopathy    Tests performed today include:  Vital signs. See below for your results today.   Medications prescribed:   Take Naproxen as per bottle recommendation. Tramadol for break through pain.   Home care instructions:  Follow any educational materials contained in this packet.  Follow-up instructions: Please follow-up with your Dr. Saintclair Halsted for further evaluation of symptoms and treatment   Return instructions:   Please return to the Emergency Department if you do not get better, if you get worse, or new symptoms OR  - Fever (temperature greater than 101.41F)  - Bleeding that does not stop with holding pressure to the area    -Severe pain (please note that you may be more sore the day after your accident)  - Chest Pain  - Difficulty breathing  - Severe nausea or vomiting  - Inability to tolerate food and liquids  - Passing out  - Skin becoming red around your wounds  - Change in mental status (confusion or lethargy)  - New numbness or weakness     Please return if you have any other emergent concerns.  Additional Information:  Your vital signs today were: BP 133/76 mmHg   Pulse 93   Temp(Src) 98.2 F (36.8 C) (Oral)   Resp 18   SpO2 97% If your blood pressure (BP) was elevated above 135/85 this visit, please have this repeated by your doctor within one month. ---------------

## 2015-06-03 NOTE — ED Notes (Signed)
Pt here for left neck pain radiating into shoulder blade and back. sts started Monday and [place] the beach today. sts worsening since then. sts hurts with movement and sore to touch.

## 2015-06-11 DIAGNOSIS — M542 Cervicalgia: Secondary | ICD-10-CM | POA: Diagnosis not present

## 2015-06-11 DIAGNOSIS — Z6830 Body mass index (BMI) 30.0-30.9, adult: Secondary | ICD-10-CM | POA: Diagnosis not present

## 2015-06-11 DIAGNOSIS — M5023 Other cervical disc displacement, cervicothoracic region: Secondary | ICD-10-CM | POA: Diagnosis not present

## 2015-06-12 DIAGNOSIS — M50221 Other cervical disc displacement at C4-C5 level: Secondary | ICD-10-CM | POA: Diagnosis not present

## 2015-06-12 DIAGNOSIS — M542 Cervicalgia: Secondary | ICD-10-CM | POA: Diagnosis not present

## 2015-06-12 DIAGNOSIS — M50223 Other cervical disc displacement at C6-C7 level: Secondary | ICD-10-CM | POA: Diagnosis not present

## 2015-06-12 DIAGNOSIS — M50222 Other cervical disc displacement at C5-C6 level: Secondary | ICD-10-CM | POA: Diagnosis not present

## 2015-06-24 DIAGNOSIS — M542 Cervicalgia: Secondary | ICD-10-CM | POA: Diagnosis not present

## 2015-06-24 DIAGNOSIS — M5137 Other intervertebral disc degeneration, lumbosacral region: Secondary | ICD-10-CM | POA: Diagnosis not present

## 2015-06-24 DIAGNOSIS — M5126 Other intervertebral disc displacement, lumbar region: Secondary | ICD-10-CM | POA: Diagnosis not present

## 2015-07-03 DIAGNOSIS — M545 Low back pain: Secondary | ICD-10-CM | POA: Diagnosis not present

## 2015-07-03 DIAGNOSIS — M6281 Muscle weakness (generalized): Secondary | ICD-10-CM | POA: Diagnosis not present

## 2015-07-03 DIAGNOSIS — M542 Cervicalgia: Secondary | ICD-10-CM | POA: Diagnosis not present

## 2015-07-06 DIAGNOSIS — M542 Cervicalgia: Secondary | ICD-10-CM | POA: Diagnosis not present

## 2015-07-06 DIAGNOSIS — M545 Low back pain: Secondary | ICD-10-CM | POA: Diagnosis not present

## 2015-07-06 DIAGNOSIS — M6281 Muscle weakness (generalized): Secondary | ICD-10-CM | POA: Diagnosis not present

## 2015-07-10 DIAGNOSIS — M542 Cervicalgia: Secondary | ICD-10-CM | POA: Diagnosis not present

## 2015-07-10 DIAGNOSIS — M6281 Muscle weakness (generalized): Secondary | ICD-10-CM | POA: Diagnosis not present

## 2015-07-10 DIAGNOSIS — M545 Low back pain: Secondary | ICD-10-CM | POA: Diagnosis not present

## 2015-07-16 DIAGNOSIS — M6281 Muscle weakness (generalized): Secondary | ICD-10-CM | POA: Diagnosis not present

## 2015-07-16 DIAGNOSIS — M542 Cervicalgia: Secondary | ICD-10-CM | POA: Diagnosis not present

## 2015-07-16 DIAGNOSIS — M545 Low back pain: Secondary | ICD-10-CM | POA: Diagnosis not present

## 2015-10-29 DIAGNOSIS — H2511 Age-related nuclear cataract, right eye: Secondary | ICD-10-CM | POA: Diagnosis not present

## 2015-10-29 DIAGNOSIS — H2513 Age-related nuclear cataract, bilateral: Secondary | ICD-10-CM | POA: Diagnosis not present

## 2015-11-10 ENCOUNTER — Other Ambulatory Visit: Payer: Self-pay | Admitting: Cardiology

## 2015-11-25 DIAGNOSIS — H2511 Age-related nuclear cataract, right eye: Secondary | ICD-10-CM | POA: Diagnosis not present

## 2015-12-02 DIAGNOSIS — H2512 Age-related nuclear cataract, left eye: Secondary | ICD-10-CM | POA: Diagnosis not present

## 2016-04-30 ENCOUNTER — Other Ambulatory Visit: Payer: Self-pay | Admitting: Cardiology

## 2016-06-20 DIAGNOSIS — M5430 Sciatica, unspecified side: Secondary | ICD-10-CM | POA: Diagnosis not present

## 2016-06-23 ENCOUNTER — Ambulatory Visit (INDEPENDENT_AMBULATORY_CARE_PROVIDER_SITE_OTHER): Payer: Commercial Managed Care - HMO | Admitting: Cardiology

## 2016-06-23 ENCOUNTER — Encounter (INDEPENDENT_AMBULATORY_CARE_PROVIDER_SITE_OTHER): Payer: Self-pay

## 2016-06-23 ENCOUNTER — Encounter: Payer: Self-pay | Admitting: Cardiology

## 2016-06-23 VITALS — BP 132/76 | HR 73 | Ht 68.5 in | Wt 208.2 lb

## 2016-06-23 DIAGNOSIS — I739 Peripheral vascular disease, unspecified: Secondary | ICD-10-CM

## 2016-06-23 DIAGNOSIS — I2510 Atherosclerotic heart disease of native coronary artery without angina pectoris: Secondary | ICD-10-CM | POA: Diagnosis not present

## 2016-06-23 DIAGNOSIS — E669 Obesity, unspecified: Secondary | ICD-10-CM | POA: Diagnosis not present

## 2016-06-23 NOTE — Patient Instructions (Signed)

## 2016-06-23 NOTE — Progress Notes (Signed)
[facility]. [address], [place]  [address] Phone: [phone] Fax:  [phone]  Date:  06/23/2016   ID:  Ray Brown, DOB Feb 04, 1951, MRN [id number]  PCP:  [name], MD   History of Present Illness: Ray Brown is a 66 y.o. male with coronary artery disease drug eluting stent in July 2010, bare-metal stent and RCA August 08 (had in-stent restenosis) with hypertension, hyperlipidemia, aortobifemoral bypass in 2001-06-18 Dr. Donnetta Hutching, sleep apnea not on CPAP here he here for followup.  10/2010-cardiac catheterization showing patent RCA stents after nuclear stress test showed inferior wall ischemia. Overall reassuring.  Prior right shoulder surgery surgery risk stratification. Had prior left shoulder surgery. No CP no SOB. Shoulder pain. No claudication. +Hip pain, cortisone. Hip pain is lateral. Saw Dr. Saintclair Halsted - injections.   Right hip pain, hard to get up in the morning. Both hips hurt with exertion. Tramadol does not seem to help.   Hyperlipidemia-excellently controlled. See below  Mother died after surgery in 06/19/14  Still having back pain, left foot numb. Pred pack.   Right iliac stent (the way he describes his procedure) as well as aortobifemoral bypass. Dr. Donnetta Hutching.    Wt Readings from Last 3 Encounters:  06/23/16 208 lb 3.2 oz (94.4 kg)  05/25/15 200 lb 9.6 oz (91 kg)  03/17/15 207 lb (93.9 kg)     Past Medical History:  Diagnosis Date  . Arthritis   . Bronchitis   . Chronic back pain   . Complication of anesthesia    difficulty breathing s/p back surgery at [facility] 06/19/11  . Coronary artery disease   . Diabetes mellitus without complication ([place])   . Dry skin    in the back of head-left side  . GERD (gastroesophageal reflux disease)   . H/O hiatal hernia   . Hypercholesteremia   . Peripheral vascular disease ([place])   . Pneumonia   . Shortness of breath   . Sleep apnea    has CPAP but does not use    Past Surgical History:  Procedure Laterality  Date  . aortic bypass  2001-06-18   aortobifemoral bypass  . BACK SURGERY  2002-06-19  . CARDIAC CATHETERIZATION  06-18-08   x 2 stents RCA  . CARDIAC CATHETERIZATION N/A 12/08/2014   Procedure: Left Heart Cath and Coronary Angiography;  Surgeon: Lorretta Harp, MD;  Location: [facility];  Service: Cardiovascular;  Laterality: N/A;  . COLONOSCOPY  08/04/2009  . CORONARY ANGIOPLASTY     2 stents  . FOOT SURGERY  1980s   right foot  . SHOULDER ARTHROSCOPY WITH SUBACROMIAL DECOMPRESSION Right 03/28/2013   Procedure: RIGHT SHOULDER ARTHROSCOPY WITH SUBACROMIAL DECOMPRESSION AND DISTAL CLAVICLE RESECTION;  Surgeon: Marin Shutter, MD;  Location: [facility];  Service: Orthopedics;  Laterality: Right;  . SHOULDER SURGERY     left shoulder rotator cuff repair  . UPPER GI ENDOSCOPY      Current Outpatient Prescriptions  Medication Sig Dispense Refill  . aspirin EC 81 MG tablet Take 81 mg by mouth at bedtime.    Marland Kitchen atorvastatin (LIPITOR) 80 MG tablet Take 1 tablet (80 mg total) by mouth daily at 6 PM. 90 tablet 3  . clopidogrel (PLAVIX) 75 MG tablet Take 1 tablet (75 mg total) by mouth at bedtime. 90 tablet 3  . fenofibrate 160 MG tablet Take 160 mg by mouth at bedtime.    . metoprolol succinate (TOPROL-XL) 25 MG 24 hr  tablet Take 1 tablet (25 mg total) by mouth daily. *Please call and schedule a one year follow up appointment* 90 tablet 0  . niacin (NIASPAN) 750 MG CR tablet Take 1,500 mg by mouth at bedtime.    . nitroGLYCERIN (NITROSTAT) 0.4 MG SL tablet Place 0.4 mg under the tongue every 5 (five) minutes as needed. For chest pain    . pantoprazole (PROTONIX) 40 MG tablet Take 40 mg by mouth at bedtime.    . traMADol (ULTRAM) 50 MG tablet Take 50 mg by mouth daily as needed.     No current facility-administered medications for this visit.     Allergies:    Allergies  Allergen Reactions  . Ciprofloxacin Rash  . Daypro [Oxaprozin] Rash    Social History:  The patient  reports that he has quit  smoking. His smoking use included Cigarettes. He quit after 35.00 years of use. He has never used smokeless tobacco. He reports that he does not drink alcohol or use drugs.   ROS:  Please see the history of present illness.   Denies any bleeding, syncope, orthopnea, PND    PHYSICAL EXAM: VS:  BP 132/76   Pulse 73   Ht 5' 8.5" (1.74 m)   Wt 208 lb 3.2 oz (94.4 kg)   BMI 31.20 kg/m  GEN: Well nourished, well developed, in no acute distress  HEENT: normal  Neck: no JVD, carotid bruits, or masses Cardiac: RRR; no murmurs, rubs, or gallops,no edema  Respiratory:  clear to auscultation bilaterally, normal work of breathing GI: soft, nontender, nondistended, + BS, overweight MS: no deformity or atrophy , palpable distal pulses Skin: warm and dry, no rash Neuro:  Alert and Oriented x 3, Strength and sensation are intact Psych: euthymic mood, full affect    EKG: EKG today 06/23/16 shows normal sinus rhythm heart rate 73 bpm nonspecific T-wave changes. Personally viewed 11/13/14-sinus rhythm, no other abnormalities, 77 bpm. Personally viewed-prior 10/04/13: Sinus rhythm rate 69 with no other significant abnormalities.   No change from prior.   Cardiac catheterization 12/08/14: IMPRESSION:Mr. Plotts has a widely patent RCA stent. The remainder of his coronary anatomy shows no evidence of obstructive disease. His LV function is normal.  ASSESSMENT AND PLAN:  Coronary artery disease  - Cardiac catheterization 2012 showed patent right coronary artery stents.  - No anginal symptoms.  - Medications reviewed.  Peripheral vascular disease  - Plavix and aspirin  - Exercise  - Dr. Donnetta Hutching, prior femoropopliteal bypass in 2003  Obesity  - Continue to encourage weight loss.  Hyperlipidemia  - High intensity atorvastatin.  - Niaspan therapy, fenofibrate, statin. LDL 41. Excellent. He reminded me that when we tried to stop his non-statin agents his cholesterol went up significantly.  - Dr. Brigitte Pulse has  been monitoring his lab work. Personally reviewed  Diabetes mellitus with hyperlipidemia, peripheral vascular disease  - Doing well, diet control  Back pain  - He is going back to see Dr. Saintclair Halsted. He has had prior back surgeries. His left foot paresthesias are worsening.  Follow-up in 1 year.  Signed, Candee Furbish, MD [facility]  06/23/2016 2:13 PM

## 2016-06-25 ENCOUNTER — Other Ambulatory Visit: Payer: Self-pay | Admitting: Cardiology

## 2016-06-28 DIAGNOSIS — M5416 Radiculopathy, lumbar region: Secondary | ICD-10-CM | POA: Diagnosis not present

## 2016-07-12 DIAGNOSIS — M5126 Other intervertebral disc displacement, lumbar region: Secondary | ICD-10-CM | POA: Diagnosis not present

## 2016-07-12 DIAGNOSIS — Z6830 Body mass index (BMI) 30.0-30.9, adult: Secondary | ICD-10-CM | POA: Diagnosis not present

## 2016-08-05 DIAGNOSIS — M5126 Other intervertebral disc displacement, lumbar region: Secondary | ICD-10-CM | POA: Diagnosis not present

## 2016-08-05 DIAGNOSIS — M5416 Radiculopathy, lumbar region: Secondary | ICD-10-CM | POA: Diagnosis not present

## 2016-08-16 DIAGNOSIS — M5416 Radiculopathy, lumbar region: Secondary | ICD-10-CM | POA: Diagnosis not present

## 2016-08-16 DIAGNOSIS — I10 Essential (primary) hypertension: Secondary | ICD-10-CM | POA: Diagnosis not present

## 2016-08-16 DIAGNOSIS — Z6830 Body mass index (BMI) 30.0-30.9, adult: Secondary | ICD-10-CM | POA: Diagnosis not present

## 2016-12-25 DIAGNOSIS — R51 Headache: Secondary | ICD-10-CM | POA: Diagnosis not present

## 2016-12-26 ENCOUNTER — Telehealth: Payer: Self-pay | Admitting: Cardiology

## 2016-12-26 NOTE — Telephone Encounter (Signed)
° °   [facility] Pre-operative Risk Assessment    Request for surgical clearance:  1. What type of surgery is being performed? Peridontal Scaling   When is this surgery scheduled? Pending  Are there any medications that need to be held prior to surgery and how long?Aspirin and Plavix   2. Practice name and name of physician performing surgery?Dr. Magnus Ivan DDS   3. What is your office phone and fax number? Ph#[phone] Fax#[phone]   4. Anesthesia type (None, local, MAC, general) ?Local    Romana Juniper 12/26/2016, 11:38 AM  _________________________________________________________________   (provider comments below)

## 2016-12-29 NOTE — Telephone Encounter (Signed)
No need to hold antiplatelet medications for periodontal surgery. Ray Furbish, MD

## 2016-12-29 NOTE — Telephone Encounter (Signed)
    Chart reviewed as part of pre-operative protocol coverage. Patient was contacted 12/29/2016 in reference to pre-operative risk assessment for pending surgery as outlined below.  Ray Brown was last seen on 06/23/16 by Dr. [name].  Since that day, Ray Brown has done well. DASI 7.25METS.  Therefore, based on ACC/AHA guidelines, the patient would be at acceptable risk for the planned procedure without further cardiovascular testing.   Will route to Dr.Skains for antiplatelet regimen holding question.   Bar Nunn, PA 12/29/2016, 1:46 PM

## 2017-01-01 ENCOUNTER — Other Ambulatory Visit: Payer: Self-pay | Admitting: Cardiology

## 2017-03-24 ENCOUNTER — Encounter (HOSPITAL_COMMUNITY): Payer: Self-pay | Admitting: *Deleted

## 2017-03-24 ENCOUNTER — Emergency Department (HOSPITAL_COMMUNITY): Payer: 59

## 2017-03-24 ENCOUNTER — Emergency Department (HOSPITAL_COMMUNITY)
Admission: EM | Admit: 2017-03-24 | Discharge: 2017-03-24 | Disposition: A | Discharge status: Home / Self Care | Payer: 59 | Attending: Emergency Medicine | Admitting: Emergency Medicine

## 2017-03-24 ENCOUNTER — Other Ambulatory Visit: Payer: Self-pay

## 2017-03-24 DIAGNOSIS — E1151 Type 2 diabetes mellitus with diabetic peripheral angiopathy without gangrene: Secondary | ICD-10-CM | POA: Diagnosis not present

## 2017-03-24 DIAGNOSIS — Z79899 Other long term (current) drug therapy: Secondary | ICD-10-CM | POA: Diagnosis not present

## 2017-03-24 DIAGNOSIS — I2510 Atherosclerotic heart disease of native coronary artery without angina pectoris: Secondary | ICD-10-CM | POA: Insufficient documentation

## 2017-03-24 DIAGNOSIS — Z7902 Long term (current) use of antithrombotics/antiplatelets: Secondary | ICD-10-CM | POA: Insufficient documentation

## 2017-03-24 DIAGNOSIS — R1032 Left lower quadrant pain: Secondary | ICD-10-CM | POA: Diagnosis not present

## 2017-03-24 DIAGNOSIS — Z7982 Long term (current) use of aspirin: Secondary | ICD-10-CM | POA: Insufficient documentation

## 2017-03-24 DIAGNOSIS — Z87891 Personal history of nicotine dependence: Secondary | ICD-10-CM | POA: Insufficient documentation

## 2017-03-24 DIAGNOSIS — Z955 Presence of coronary angioplasty implant and graft: Secondary | ICD-10-CM | POA: Diagnosis not present

## 2017-03-24 LAB — COMPREHENSIVE METABOLIC PANEL
ALBUMIN: 4 g/dL (ref 3.5–5.0)
ALK PHOS: 84 U/L (ref 38–126)
ALT: 19 U/L (ref 17–63)
ANION GAP: 12 (ref 5–15)
AST: 28 U/L (ref 15–41)
BUN: 10 mg/dL (ref 6–20)
CALCIUM: 9.7 mg/dL (ref 8.9–10.3)
CO2: 23 mmol/L (ref 22–32)
Chloride: 105 mmol/L (ref 101–111)
Creatinine, Ser: 0.83 mg/dL (ref 0.61–1.24)
GFR calc non Af Amer: >60 mL/min (ref >60)
GLUCOSE: 134 mg/dL — AB (ref 65–99)
POTASSIUM: 3.8 mmol/L (ref 3.5–5.1)
SODIUM: 140 mmol/L (ref 135–145)
TOTAL PROTEIN: 6.4 g/dL — AB (ref 6.5–8.1)
Total Bilirubin: 0.6 mg/dL (ref 0.3–1.2)

## 2017-03-24 LAB — URINALYSIS, ROUTINE W REFLEX MICROSCOPIC
BILIRUBIN URINE: NEGATIVE
Glucose, UA: NEGATIVE mg/dL
Hgb urine dipstick: NEGATIVE
Ketones, ur: NEGATIVE mg/dL
Leukocytes, UA: NEGATIVE
NITRITE: NEGATIVE
PH: 6 (ref 5.0–8.0)
Protein, ur: NEGATIVE mg/dL
SPECIFIC GRAVITY, URINE: 1.024 (ref 1.005–1.030)

## 2017-03-24 LAB — LIPASE, BLOOD: Lipase: 25 U/L (ref 11–51)

## 2017-03-24 LAB — CBC
HEMATOCRIT: 46.9 % (ref 39.0–52.0)
HEMOGLOBIN: 16 g/dL (ref 13.0–17.0)
MCH: 31.8 pg (ref 26.0–34.0)
MCHC: 34.1 g/dL (ref 30.0–36.0)
MCV: 93.2 fL (ref 78.0–100.0)
Platelets: 254 10*3/uL (ref 150–400)
RBC: 5.03 MIL/uL (ref 4.22–5.81)
RDW: 13.7 % (ref 11.5–15.5)
WBC: 12.4 10*3/uL — ABNORMAL HIGH (ref 4.0–10.5)

## 2017-03-24 LAB — I-STAT TROPONIN, ED: TROPONIN I, POC: 0 ng/mL (ref 0.00–0.08)

## 2017-03-24 NOTE — ED Notes (Signed)
Pt reports generalized stomach pain worse in mid abd and L side. Pt denies fever, chills, N/V.

## 2017-03-24 NOTE — ED Notes (Signed)
ED Provider at bedside. 

## 2017-03-24 NOTE — ED Triage Notes (Signed)
Pt reports having severe abd pain from epigastric down to lower abd. Describes as severe stabbing pain that started around 1400. Has nausea, no vomiting.

## 2017-03-24 NOTE — ED Notes (Signed)
Patient transported to CT 

## 2017-03-24 NOTE — ED Provider Notes (Signed)
[facility] EMERGENCY DEPARTMENT Provider Note   CSN: [id number] Arrival date & time: 03/24/17  7619     History   Chief Complaint Chief Complaint  Patient presents with  . Abdominal Pain    HPI Ray Brown is a 67 y.o. male.  Patient is a 67 year old male who presents with abdominal pain.  He states he had a sudden onset of sharp pain around 2:00 this afternoon.  It radiates around to his back on both sides.  The pain is concentrated more in his left lower abdomen.  He has had some nausea but no vomiting.  He states initially the pain was coming and going but now is more constant.  It was initially more severe when he first came into the emergency department but now has eased off.  He denies any fevers.  He is having normal bowel movements.  No urinary symptoms.  No visible hematuria.  No history of similar symptoms in the past.  He has not taken anything at home for the pain.      Past Medical History:  Diagnosis Date  . Arthritis   . Bronchitis   . Chronic back pain   . Complication of anesthesia    difficulty breathing s/p back surgery [facility]  . Coronary artery disease   . Diabetes mellitus without complication ([place])   . Dry skin    in the back of head-left side  . GERD (gastroesophageal reflux disease)   . H/O hiatal hernia   . Hypercholesteremia   . Peripheral vascular disease ([place])   . Pneumonia   . Shortness of breath   . Sleep apnea    has CPAP but does not use    Patient Active Problem List   Diagnosis Date Noted  . Unstable angina ([place]) 12/05/2014  . Dyspnea 12/05/2014  . Dizziness 12/05/2014  . Diabetes mellitus with peripheral artery disease ([place]) 10/04/2013  . Coronary artery disease involving native coronary artery of native heart without angina pectoris 10/04/2013  . Impingement syndrome of right shoulder 03/28/2013  . Hyperlipemia 01/21/2013  . Obesity 01/21/2013  . Coronary artery disease   . Peripheral vascular  disease [facility])     Past Surgical History:  Procedure Laterality Date  . aortic bypass  2003   aortobifemoral bypass  . BACK SURGERY  2004  . CARDIAC CATHETERIZATION  2010   x 2 stents RCA  . CARDIAC CATHETERIZATION N/A 12/08/2014   Procedure: Left Heart Cath and Coronary Angiography;  Surgeon: Lorretta Harp, MD;  Location: [facility];  Service: Cardiovascular;  Laterality: N/A;  . COLONOSCOPY  08/04/2009  . CORONARY ANGIOPLASTY     2 stents  . FOOT SURGERY  1980s   right foot  . SHOULDER ARTHROSCOPY WITH SUBACROMIAL DECOMPRESSION Right 03/28/2013   Procedure: RIGHT SHOULDER ARTHROSCOPY WITH SUBACROMIAL DECOMPRESSION AND DISTAL CLAVICLE RESECTION;  Surgeon: Marin Shutter, MD;  Location: [facility];  Service: Orthopedics;  Laterality: Right;  . SHOULDER SURGERY     left shoulder rotator cuff repair  . UPPER GI ENDOSCOPY         Home Medications    Prior to Admission medications   Medication Sig Start Date End Date Taking? Authorizing Provider  aspirin EC 81 MG tablet Take 81 mg by mouth at bedtime.   Yes [provider]  atorvastatin (LIPITOR) 80 MG tablet TAKE 1 TABLET BY MOUTH  DAILY AT 6 PM. 01/02/17  Yes [name], MD  clopidogrel (PLAVIX) 75 MG tablet TAKE 1 TABLET BY MOUTH AT  BEDTIME 01/02/17  Yes [name], MD  fenofibrate 160 MG tablet Take 160 mg by mouth at bedtime.   Yes [provider]  metoprolol succinate (TOPROL-XL) 25 MG 24 hr tablet TAKE 1 TABLET BY MOUTH  DAILY. 06/27/16  Yes [name], MD  niacin (NIASPAN) 750 MG CR tablet Take 1,500 mg by mouth at bedtime.   Yes [provider]  nitroGLYCERIN (NITROSTAT) 0.4 MG SL tablet Place 0.4 mg under the tongue every 5 (five) minutes as needed. For chest pain   Yes [provider]  pantoprazole (PROTONIX) 40 MG tablet Take 40 mg by mouth at bedtime.   Yes [provider]  traMADol (ULTRAM) 50 MG tablet Take 50 mg by mouth every 12 (twelve) hours as needed for  moderate pain.  06/20/16  Yes [provider]    Family History Family History  Problem Relation Age of Onset  . Colon cancer Mother   . Heart attack Father        , massive  . Coronary artery disease Brother   . Thyroid cancer Brother     Social History Social History   Tobacco Use  . Smoking status: Former Smoker    Years: 35.00    Types: Cigarettes  . Smokeless tobacco: Never Used  Substance Use Topics  . Alcohol use: No  . Drug use: No     Allergies   Ciprofloxacin and Daypro [oxaprozin]   Review of Systems Review of Systems  Constitutional: Negative for chills, diaphoresis, fatigue and fever.  HENT: Negative for congestion, rhinorrhea and sneezing.   Eyes: Negative.   Respiratory: Negative for cough, chest tightness and shortness of breath.   Cardiovascular: Negative for chest pain and leg swelling.  Gastrointestinal: Positive for abdominal pain and nausea. Negative for blood in stool, diarrhea and vomiting.  Genitourinary: Negative for difficulty urinating, flank pain, frequency and hematuria.  Musculoskeletal: Positive for back pain. Negative for arthralgias.  Skin: Negative for rash.  Neurological: Negative for dizziness, speech difficulty, weakness, numbness and headaches.     Physical Exam Updated Vital Signs BP (!) 105/51   Pulse 74   Temp 98.1 F (36.7 C) (Oral)   Resp 20   SpO2 93%   Physical Exam  Constitutional: He is oriented to person, place, and time. He appears well-developed and well-nourished.  HENT:  Head: Normocephalic and atraumatic.  Eyes: Pupils are equal, round, and reactive to light.  Neck: Normal range of motion. Neck supple.  Cardiovascular: Normal rate, regular rhythm and normal heart sounds.  Pulmonary/Chest: Effort normal and breath sounds normal. No respiratory distress. He has no wheezes. He has no rales. He exhibits no tenderness.  Abdominal: Soft. Bowel sounds are normal. There is tenderness (Positive  tenderness in the left lower abdomen). There is no rebound and no guarding.  Musculoskeletal: Normal range of motion. He exhibits no edema.  Lymphadenopathy:    He has no cervical adenopathy.  Neurological: He is alert and oriented to person, place, and time.  Skin: Skin is warm and dry. No rash noted.  Psychiatric: He has a normal mood and affect.     ED Treatments / Results  Labs (all labs ordered are listed, but only abnormal results are displayed) Labs Reviewed  COMPREHENSIVE METABOLIC PANEL - Abnormal; Notable for the following components:      Result Value   Glucose, Bld 134 (*)    Total Protein  6.4 (*)    All other components within normal limits  CBC - Abnormal; Notable for the following components:   WBC 12.4 (*)    All other components within normal limits  LIPASE, BLOOD  URINALYSIS, ROUTINE W REFLEX MICROSCOPIC  I-STAT TROPONIN, ED    EKG  EKG Interpretation  Date/Time:  Friday March 24 2017 18:46:25 EST Ventricular Rate:  90 PR Interval:  128 QRS Duration: 98 QT Interval:  366 QTC Calculation: 447 R Axis:   -25 Text Interpretation:  Normal sinus rhythm Normal ECG since last tracing no significant change Confirmed by Malvin Johns [id number]) on 03/24/2017 9:18:56 PM       Radiology Ct Renal Stone Study  Result Date: 03/24/2017 CLINICAL DATA:  Flank pain. EXAM: CT ABDOMEN AND PELVIS WITHOUT CONTRAST TECHNIQUE: Multidetector CT imaging of the abdomen and pelvis was performed following the standard protocol without IV contrast. COMPARISON:  CT scan of March 12, 2012. FINDINGS: Lower chest: No acute abnormality. Hepatobiliary: No focal liver abnormality is seen. No gallstones, gallbladder wall thickening, or biliary dilatation. Pancreas: Unremarkable. No pancreatic ductal dilatation or surrounding inflammatory changes. Spleen: Normal in size without focal abnormality. Adrenals/Urinary Tract: Adrenal glands are unremarkable. Kidneys are normal, without renal  calculi, focal lesion, or hydronephrosis. Bladder is unremarkable. Stomach/Bowel: Stomach is within normal limits. Appendix appears normal. No evidence of bowel wall thickening, distention, or inflammatory changes. Diverticulosis of sigmoid colon is noted without inflammation. Vascular/Lymphatic: Status post aorto bi femoral bypass graft placement. No significant adenopathy is noted. Reproductive: Mild prostatic enlargement is noted. Other: No abdominal wall hernia or abnormality. No abdominopelvic ascites. Musculoskeletal: No acute or significant osseous findings. IMPRESSION: Diverticulosis of sigmoid colon is noted without inflammation. Status post aortobifemoral bypass graft placement. Mild prostatic enlargement. No acute abnormality seen in the abdomen or pelvis. Electronically Signed   By: Marijo Conception, M.D.   On: 03/24/2017 21:53    Procedures Procedures (including critical care time)  Medications Ordered in ED Medications - No data to display   Initial Impression / Assessment and Plan / ED Course  I have reviewed the triage vital signs and the nursing notes.  Pertinent labs & imaging results that were available during my care of the patient were reviewed by me and considered in my medical decision making (see chart for details).     Patient is a 67 year old male who presents with sudden onset of left sided abdominal pain.  Currently his abdominal pain has completely subsided.  He refused any for any pain medication in the emergency department.  His CT scan was performed which shows no evidence of kidney stone and no other acute abnormality.  He currently has a benign abdominal exam.  His labs are non-concerning.  His LFTs are normal.  He was discharged home in good condition.  He was encouraged to have close follow-up with his PCP.  He was advised to use a clear liquid diet for the next 24 hours and return here as needed for any worsening symptoms.  I did advise him that this may be an  early process that were just not seen yet and if he has any worsening pain, vomiting or fevers he needs to return the emergency department for recheck.  Final Clinical Impressions(s) / ED Diagnoses   Final diagnoses:  Left lower quadrant pain    ED Discharge Orders    None       Malvin Johns, MD 03/24/17 2308

## 2017-04-13 DIAGNOSIS — Z961 Presence of intraocular lens: Secondary | ICD-10-CM | POA: Diagnosis not present

## 2017-04-13 DIAGNOSIS — R7303 Prediabetes: Secondary | ICD-10-CM | POA: Diagnosis not present

## 2017-04-13 DIAGNOSIS — H524 Presbyopia: Secondary | ICD-10-CM | POA: Diagnosis not present

## 2017-05-02 ENCOUNTER — Encounter: Payer: Self-pay | Admitting: Cardiology

## 2017-05-02 DIAGNOSIS — I739 Peripheral vascular disease, unspecified: Secondary | ICD-10-CM | POA: Diagnosis not present

## 2017-05-02 DIAGNOSIS — K2270 Barrett's esophagus without dysplasia: Secondary | ICD-10-CM | POA: Diagnosis not present

## 2017-05-02 DIAGNOSIS — L409 Psoriasis, unspecified: Secondary | ICD-10-CM | POA: Diagnosis not present

## 2017-05-02 DIAGNOSIS — Z0000 Encounter for general adult medical examination without abnormal findings: Secondary | ICD-10-CM | POA: Diagnosis not present

## 2017-05-02 DIAGNOSIS — E669 Obesity, unspecified: Secondary | ICD-10-CM | POA: Diagnosis not present

## 2017-05-02 DIAGNOSIS — E1151 Type 2 diabetes mellitus with diabetic peripheral angiopathy without gangrene: Secondary | ICD-10-CM | POA: Diagnosis not present

## 2017-05-02 DIAGNOSIS — Z125 Encounter for screening for malignant neoplasm of prostate: Secondary | ICD-10-CM | POA: Diagnosis not present

## 2017-05-02 DIAGNOSIS — I119 Hypertensive heart disease without heart failure: Secondary | ICD-10-CM | POA: Diagnosis not present

## 2017-05-02 DIAGNOSIS — E1142 Type 2 diabetes mellitus with diabetic polyneuropathy: Secondary | ICD-10-CM | POA: Diagnosis not present

## 2017-05-02 DIAGNOSIS — I25119 Atherosclerotic heart disease of native coronary artery with unspecified angina pectoris: Secondary | ICD-10-CM | POA: Diagnosis not present

## 2017-05-02 DIAGNOSIS — E782 Mixed hyperlipidemia: Secondary | ICD-10-CM | POA: Diagnosis not present

## 2017-05-02 DIAGNOSIS — I700 Atherosclerosis of aorta: Secondary | ICD-10-CM | POA: Diagnosis not present

## 2017-06-02 ENCOUNTER — Other Ambulatory Visit: Payer: Self-pay | Admitting: Cardiology

## 2017-06-09 ENCOUNTER — Other Ambulatory Visit: Payer: Self-pay | Admitting: Cardiology

## 2017-06-13 ENCOUNTER — Telehealth: Payer: Self-pay

## 2017-06-13 NOTE — Telephone Encounter (Signed)
   [facility] Pre-operative Risk Assessment    Request for surgical clearance:  1. What type of surgery is being performed? Lumbar Epidural   2. When is this surgery scheduled? 06/21/17   3. What type of clearance is required (medical clearance vs. Pharmacy clearance to hold med vs. Both)? Clearance to hold medications  4. Are there any medications that need to be held prior to surgery and how long? ASA and Plavix   5. Practice name and name of physician performing surgery? [facility]: Dr. Marlaine Hind   6. What is your office phone number? [phone]    7.   What is your office fax number? [phone]  8.   Anesthesia type (None, local, MAC, general) ? Local

## 2017-06-13 NOTE — Telephone Encounter (Signed)
Dr. [name] this pt has hx of aorto-fem bypass in 2003 and remote stent to RCA, patent at last cath in 2016. He is on aspirin and Plaivx. He is planned for lumbar epidural on 06/21/17.   Can his plavix and aspirin be held prior to the procedure and for how long?  Per phone call to the patient her has done well since his last appointment with no new symptoms. He is due for his annual follow up next month. He should be OK to proceed with this low risk procedure without any prior CV testing.

## 2017-06-14 NOTE — Telephone Encounter (Signed)
   Primary Cardiologist: Dr [name]  Chart reviewed as part of pre-operative protocol coverage. Given past medical history and time since last visit, based on ACC/AHA guidelines, [facility] NYCE would be at acceptable risk for the planned procedure without further cardiovascular testing.   Dr [name] has indicated the patient can hold plavix and ASA 5 days pre op as needed, resume ASAP post op.   I will route this recommendation to the requesting party via Epic fax function and remove from pre-op pool.  Please call with questions.  Kerin Ransom, PA-C 06/14/2017, 2:07 PM

## 2017-06-14 NOTE — Telephone Encounter (Signed)
OK to hold ASA and Plavix 5 days prior to lumbar injection. These medications would be a contraindication to this procedure (because of potential spinal bleeding event). His risk to legs and heart(MI) are small with this short duration hold.  Candee Furbish, MD

## 2017-06-21 DIAGNOSIS — M5126 Other intervertebral disc displacement, lumbar region: Secondary | ICD-10-CM | POA: Diagnosis not present

## 2017-06-21 DIAGNOSIS — R030 Elevated blood-pressure reading, without diagnosis of hypertension: Secondary | ICD-10-CM | POA: Diagnosis not present

## 2017-06-21 DIAGNOSIS — M5416 Radiculopathy, lumbar region: Secondary | ICD-10-CM | POA: Diagnosis not present

## 2017-06-26 DIAGNOSIS — M5416 Radiculopathy, lumbar region: Secondary | ICD-10-CM | POA: Diagnosis not present

## 2017-06-26 DIAGNOSIS — M5126 Other intervertebral disc displacement, lumbar region: Secondary | ICD-10-CM | POA: Diagnosis not present

## 2017-08-22 ENCOUNTER — Other Ambulatory Visit: Payer: Self-pay | Admitting: Cardiology

## 2017-09-21 NOTE — Progress Notes (Signed)
[facility]. [address] [place],   [address] Phone: [phone] Fax:  [phone]  Date:  09/22/2017   ID:  Ray Brown, DOB 16-Nov-1950, MRN [id number]  PCP:  Ray Neer, MD   History of Present Illness: Ray Brown is a 67 y.o. male with coronary artery disease drug eluting stent in July 2010, bare-metal stent and RCA August 08 (had in-stent restenosis) with hypertension, hyperlipidemia, aortobifemoral bypass in 05-24-01 Ray Brown, sleep apnea not on CPAP here he here for followup.  10/2010 and 2016-cardiac catheterization showing patent RCA stents after nuclear stress test showed inferior wall ischemia. Overall reassuring.  Prior right shoulder surgery surgery risk stratification. Had prior left shoulder surgery. No claudication. +Hip pain, cortisone. Hip pain is lateral. Saw Dr. Saintclair Brown - injections.   Right hip pain, hard to get up in the morning. Both hips hurt with exertion. Tramadol does not seem to help.   Hyperlipidemia-excellently controlled. See below  Mother died after surgery in 05/25/14  Still having back pain, left foot numb. Pred pack.   Right iliac stent (the way he describes his procedure) as well as aortobifemoral bypass. Ray Brown.   09/22/2017- overall main complaint is back pain, left leg sciatica.  Getting injections for this.  Holding his Plavix and aspirin when injections are taking place.  This is good.  He is not having any anginal symptoms, no syncope, no bleeding.  He is compliant with his cholesterol medications.  No myalgias.   Wt Readings from Last 3 Encounters:  09/22/17 209 lb 9.6 oz (95.1 kg)  06/23/16 208 lb 3.2 oz (94.4 kg)  05/25/15 200 lb 9.6 oz (91 kg)     Past Medical History:  Diagnosis Date  . Arthritis   . Bronchitis   . Chronic back pain   . Complication of anesthesia    difficulty breathing s/p back surgery at [facility] 2011/05/25  . Coronary artery disease   . Diabetes mellitus without complication ([place])   . Dry skin    in the back of head-left side  . GERD (gastroesophageal reflux disease)   . H/O hiatal hernia   . Hypercholesteremia   . Peripheral vascular disease ([place])   . Pneumonia   . Shortness of breath   . Sleep apnea    has CPAP but does not use    Past Surgical History:  Procedure Laterality Date  . aortic bypass  2001/05/24   aortobifemoral bypass  . BACK SURGERY  05/25/02  . CARDIAC CATHETERIZATION  2008-05-24   x 2 stents RCA  . CARDIAC CATHETERIZATION N/A 12/08/2014   Procedure: Left Heart Cath and Coronary Angiography;  Surgeon: Ray Harp, MD;  Location: [facility];  Service: Cardiovascular;  Laterality: N/A;  . COLONOSCOPY  08/04/2009  . CORONARY ANGIOPLASTY     2 stents  . FOOT SURGERY  1980s   right foot  . SHOULDER ARTHROSCOPY WITH SUBACROMIAL DECOMPRESSION Right 03/28/2013   Procedure: RIGHT SHOULDER ARTHROSCOPY WITH SUBACROMIAL DECOMPRESSION AND DISTAL CLAVICLE RESECTION;  Surgeon: Ray Shutter, MD;  Location: [facility];  Service: Orthopedics;  Laterality: Right;  . SHOULDER SURGERY     left shoulder rotator cuff repair  . UPPER GI ENDOSCOPY      Current Outpatient Medications  Medication Sig Dispense Refill  . aspirin EC 81 MG tablet Take 81 mg by mouth at bedtime.    Marland Kitchen atorvastatin (LIPITOR) 80 MG tablet TAKE 1 TABLET BY MOUTH  DAILY  AT 6PM 90 tablet 0  . clopidogrel (PLAVIX) 75 MG tablet TAKE 1 TABLET BY MOUTH AT  BEDTIME 90 tablet 0  . fenofibrate 160 MG tablet Take 160 mg by mouth at bedtime.    . metoprolol succinate (TOPROL-XL) 25 MG 24 hr tablet Take 1 tablet (25 mg total) by mouth daily. Please make yearly appt with Dr. Marlou Brown for May before anymore refills. 1st attempt 30 tablet 1  . niacin (NIASPAN) 750 MG CR tablet Take 1,500 mg by mouth at bedtime.    . nitroGLYCERIN (NITROSTAT) 0.4 MG SL tablet Place 0.4 mg under the tongue every 5 (five) minutes as needed. For chest pain    . pantoprazole (PROTONIX) 40 MG tablet Take 40 mg by mouth at bedtime.    . traMADol  (ULTRAM) 50 MG tablet Take 50 mg by mouth every 12 (twelve) hours as needed for moderate pain.      No current facility-administered medications for this visit.     Allergies:    Allergies  Allergen Reactions  . Ciprofloxacin Rash  . Daypro [Oxaprozin] Rash    Social History:  The patient  reports that he has quit smoking. His smoking use included cigarettes. He quit after 35.00 years of use. He has never used smokeless tobacco. He reports that he does not drink alcohol or use drugs.   ROS:  Please see the history of present illness.  All other review of systems negative  PHYSICAL EXAM: VS:  BP 140/80   Pulse 69   Ht 5\' 8"  (1.727 m)   Wt 209 lb 9.6 oz (95.1 kg)   SpO2 97%   BMI 31.87 kg/m  GEN: Well nourished, well developed, in no acute distress  HEENT: normal  Neck: no JVD, carotid bruits, or masses Cardiac: RRR; no murmurs, rubs, or gallops,no edema  Respiratory:  clear to auscultation bilaterally, normal work of breathing GI: soft, nontender, nondistended, + BS MS: no deformity or atrophy  Skin: warm and dry, no rash Neuro:  Alert and Oriented x 3, Strength and sensation are intact Psych: euthymic mood, full affect  EKG: Prior normal sinus rhythm heart rate 73 bpm nonspecific T-wave changes. Personally viewed 11/13/14-sinus rhythm, no other abnormalities, 77 bpm. Personally viewed-prior 10/04/13: Sinus rhythm rate 69 with no other significant abnormalities.   No change from prior.   Cardiac catheterization 12/08/14:  IMPRESSION:Mr. Ray Brown has a widely patent RCA stent. The remainder of his coronary anatomy shows no evidence of obstructive disease. His LV function is normal.  ASSESSMENT AND PLAN:  Coronary artery disease -Cardiac catheterization 2016 showed patent RCA stent.  Overall he is been doing quite well without any anginal symptoms that are significant.  Continue with aggressive secondary risk factor prevention.  Peripheral vascular disease  - Plavix and  aspirin  - Exercise  - Ray Brown, prior femoropopliteal bypass in 2003  Obesity  - Continue to encourage weight loss.  No changes  Hyperlipidemia  - High intensity atorvastatin.  - Niaspan therapy, fenofibrate, statin. LDL 41. Excellent. He reminded me that when we tried to stop his non-statin agents his cholesterol went up significantly.  - Dr. Brigitte Pulse has been monitoring his lab work. Personally reviewed.  We will continue without any changes.  Diabetes mellitus with hyperlipidemia, peripheral vascular disease  - Doing well, diet control, 7.2 A1c  Back pain  - Dr. Saintclair Brown. He has had prior back surgeries. His left foot paresthesias are worsening.  Injections.  Follow-up in 1 year.  Signed, Elta Guadeloupe  Ray Porch, MD [facility]  09/22/2017 9:23 AM

## 2017-09-22 ENCOUNTER — Ambulatory Visit (INDEPENDENT_AMBULATORY_CARE_PROVIDER_SITE_OTHER): Payer: 59 | Admitting: Cardiology

## 2017-09-22 ENCOUNTER — Encounter (INDEPENDENT_AMBULATORY_CARE_PROVIDER_SITE_OTHER): Payer: Self-pay

## 2017-09-22 ENCOUNTER — Encounter: Payer: Self-pay | Admitting: Cardiology

## 2017-09-22 VITALS — BP 140/80 | HR 69 | Ht 68.0 in | Wt 209.6 lb

## 2017-09-22 DIAGNOSIS — E782 Mixed hyperlipidemia: Secondary | ICD-10-CM

## 2017-09-22 DIAGNOSIS — I739 Peripheral vascular disease, unspecified: Secondary | ICD-10-CM | POA: Diagnosis not present

## 2017-09-22 DIAGNOSIS — I2510 Atherosclerotic heart disease of native coronary artery without angina pectoris: Secondary | ICD-10-CM | POA: Diagnosis not present

## 2017-09-22 NOTE — Patient Instructions (Signed)
Medication Instructions:  The current medical regimen is effective;  continue present plan and medications.  Follow-Up: Follow up in 1 year with Dr. Skains.  You will receive a letter in the mail 2 months before you are due.  Please call us when you receive this letter to schedule your follow up appointment.  Thank you for choosing [facility]!!     

## 2017-10-04 ENCOUNTER — Emergency Department (HOSPITAL_COMMUNITY): Payer: 59

## 2017-10-04 ENCOUNTER — Emergency Department (HOSPITAL_COMMUNITY)
Admission: EM | Admit: 2017-10-04 | Discharge: 2017-10-04 | Disposition: A | Discharge status: Home / Self Care | Payer: 59 | Attending: Emergency Medicine | Admitting: Emergency Medicine

## 2017-10-04 ENCOUNTER — Encounter (HOSPITAL_COMMUNITY): Payer: Self-pay | Admitting: Emergency Medicine

## 2017-10-04 DIAGNOSIS — E119 Type 2 diabetes mellitus without complications: Secondary | ICD-10-CM | POA: Insufficient documentation

## 2017-10-04 DIAGNOSIS — R51 Headache: Secondary | ICD-10-CM | POA: Insufficient documentation

## 2017-10-04 DIAGNOSIS — Z7982 Long term (current) use of aspirin: Secondary | ICD-10-CM | POA: Insufficient documentation

## 2017-10-04 DIAGNOSIS — I2510 Atherosclerotic heart disease of native coronary artery without angina pectoris: Secondary | ICD-10-CM | POA: Diagnosis not present

## 2017-10-04 DIAGNOSIS — Z7902 Long term (current) use of antithrombotics/antiplatelets: Secondary | ICD-10-CM | POA: Diagnosis not present

## 2017-10-04 DIAGNOSIS — Z951 Presence of aortocoronary bypass graft: Secondary | ICD-10-CM | POA: Insufficient documentation

## 2017-10-04 DIAGNOSIS — Z79899 Other long term (current) drug therapy: Secondary | ICD-10-CM | POA: Insufficient documentation

## 2017-10-04 DIAGNOSIS — R531 Weakness: Secondary | ICD-10-CM | POA: Diagnosis not present

## 2017-10-04 DIAGNOSIS — R200 Anesthesia of skin: Secondary | ICD-10-CM | POA: Insufficient documentation

## 2017-10-04 DIAGNOSIS — M6281 Muscle weakness (generalized): Secondary | ICD-10-CM | POA: Diagnosis not present

## 2017-10-04 DIAGNOSIS — R29898 Other symptoms and signs involving the musculoskeletal system: Secondary | ICD-10-CM | POA: Diagnosis not present

## 2017-10-04 DIAGNOSIS — M5416 Radiculopathy, lumbar region: Secondary | ICD-10-CM | POA: Diagnosis not present

## 2017-10-04 DIAGNOSIS — Z87891 Personal history of nicotine dependence: Secondary | ICD-10-CM | POA: Diagnosis not present

## 2017-10-04 DIAGNOSIS — I10 Essential (primary) hypertension: Secondary | ICD-10-CM | POA: Diagnosis not present

## 2017-10-04 DIAGNOSIS — R1030 Lower abdominal pain, unspecified: Secondary | ICD-10-CM | POA: Insufficient documentation

## 2017-10-04 DIAGNOSIS — R42 Dizziness and giddiness: Secondary | ICD-10-CM | POA: Diagnosis not present

## 2017-10-04 DIAGNOSIS — R269 Unspecified abnormalities of gait and mobility: Secondary | ICD-10-CM | POA: Insufficient documentation

## 2017-10-04 DIAGNOSIS — S8992XA Unspecified injury of left lower leg, initial encounter: Secondary | ICD-10-CM | POA: Diagnosis not present

## 2017-10-04 LAB — URINALYSIS, ROUTINE W REFLEX MICROSCOPIC
BILIRUBIN URINE: NEGATIVE
GLUCOSE, UA: NEGATIVE mg/dL
HGB URINE DIPSTICK: NEGATIVE
Ketones, ur: NEGATIVE mg/dL
Leukocytes, UA: NEGATIVE
Nitrite: NEGATIVE
PH: 8 (ref 5.0–8.0)
Protein, ur: NEGATIVE mg/dL
SPECIFIC GRAVITY, URINE: 1.008 (ref 1.005–1.030)

## 2017-10-04 LAB — CBC WITH DIFFERENTIAL/PLATELET
Abs Immature Granulocytes: 0 10*3/uL (ref 0.0–0.1)
Basophils Absolute: 0 10*3/uL (ref 0.0–0.1)
Basophils Relative: 0 %
EOS PCT: 1 %
Eosinophils Absolute: 0.1 10*3/uL (ref 0.0–0.7)
HEMATOCRIT: 47.6 % (ref 39.0–52.0)
Hemoglobin: 15.7 g/dL (ref 13.0–17.0)
Immature Granulocytes: 0 %
LYMPHS ABS: 1.8 10*3/uL (ref 0.7–4.0)
Lymphocytes Relative: 20 %
MCH: 31.2 pg (ref 26.0–34.0)
MCHC: 33 g/dL (ref 30.0–36.0)
MCV: 94.6 fL (ref 78.0–100.0)
MONOS PCT: 7 %
Monocytes Absolute: 0.6 10*3/uL (ref 0.1–1.0)
Neutro Abs: 6.4 10*3/uL (ref 1.7–7.7)
Neutrophils Relative %: 72 %
Platelets: 242 10*3/uL (ref 150–400)
RBC: 5.03 MIL/uL (ref 4.22–5.81)
RDW: 13 % (ref 11.5–15.5)
WBC: 9 10*3/uL (ref 4.0–10.5)

## 2017-10-04 LAB — BASIC METABOLIC PANEL
Anion gap: 8 (ref 5–15)
BUN: 7 mg/dL — AB (ref 8–23)
CHLORIDE: 109 mmol/L (ref 98–111)
CO2: 25 mmol/L (ref 22–32)
Calcium: 9.8 mg/dL (ref 8.9–10.3)
Creatinine, Ser: 0.75 mg/dL (ref 0.61–1.24)
GFR calc Af Amer: >60 mL/min (ref >60)
GFR calc non Af Amer: >60 mL/min (ref >60)
Glucose, Bld: 124 mg/dL — ABNORMAL HIGH (ref 70–99)
POTASSIUM: 4 mmol/L (ref 3.5–5.1)
SODIUM: 142 mmol/L (ref 135–145)

## 2017-10-04 LAB — PROTIME-INR
INR: 0.96
Prothrombin Time: 12.7 seconds (ref 11.4–15.2)

## 2017-10-04 MED ORDER — GADOBENATE DIMEGLUMINE 529 MG/ML IV SOLN
20.0000 mL | Freq: Once | INTRAVENOUS | Status: AC | PRN
Start: 2017-10-04 — End: 2017-10-04
  Administered 2017-10-04: 20 mL via INTRAVENOUS

## 2017-10-04 NOTE — ED Provider Notes (Signed)
[facility] EMERGENCY DEPARTMENT Provider Note   CSN: [id number] Arrival date & time: 10/04/17  1507     History   Chief Complaint No chief complaint on file.   HPI Ray Brown is a 67 y.o. male.  HPI Patient presents with weakness and numbness in both lower extremities as well as suprapubic pain. Patient has a history of spinal stenosis, chronic left posterior lower leg pain, receives frequent injections. Today, after receiving a scheduled injection he had an unusual reaction with development of the after mentioned complaints including bilateral lower extremity weakness, numbness as well as gait difficulty, and headache. Seems as though the suprapubic pain began about the same time. Since that time the patient has been transferred from the outpatient neurosurgical center here for evaluation, has had resolution of his headache, but persistency of the upper complaints. No incontinence, no upper extremity neurologic phenomena. Is accompanied by his wife who assists with the HPI. Past Medical History:  Diagnosis Date  . Arthritis   . Bronchitis   . Chronic back pain   . Complication of anesthesia    difficulty breathing s/p back surgery at [facility] 2013  . Coronary artery disease   . Diabetes mellitus without complication ([place])   . Dry skin    in the back of head-left side  . GERD (gastroesophageal reflux disease)   . H/O hiatal hernia   . Hypercholesteremia   . Peripheral vascular disease ([place])   . Pneumonia   . Shortness of breath   . Sleep apnea    has CPAP but does not use    Patient Active Problem List   Diagnosis Date Noted  . Unstable angina ([place]) 12/05/2014  . Dyspnea 12/05/2014  . Dizziness 12/05/2014  . Diabetes mellitus with peripheral artery disease ([place]) 10/04/2013  . Coronary artery disease involving native coronary artery of native heart without angina pectoris 10/04/2013  . Impingement syndrome of right shoulder 03/28/2013  .  Hyperlipemia 01/21/2013  . Obesity 01/21/2013  . Coronary artery disease   . Peripheral vascular disease [facility])     Past Surgical History:  Procedure Laterality Date  . aortic bypass  2003   aortobifemoral bypass  . BACK SURGERY  2004  . CARDIAC CATHETERIZATION  2010   x 2 stents RCA  . CARDIAC CATHETERIZATION N/A 12/08/2014   Procedure: Left Heart Cath and Coronary Angiography;  Surgeon: Lorretta Harp, MD;  Location: [facility];  Service: Cardiovascular;  Laterality: N/A;  . COLONOSCOPY  08/04/2009  . CORONARY ANGIOPLASTY     2 stents  . FOOT SURGERY  1980s   right foot  . SHOULDER ARTHROSCOPY WITH SUBACROMIAL DECOMPRESSION Right 03/28/2013   Procedure: RIGHT SHOULDER ARTHROSCOPY WITH SUBACROMIAL DECOMPRESSION AND DISTAL CLAVICLE RESECTION;  Surgeon: Marin Shutter, MD;  Location: [facility];  Service: Orthopedics;  Laterality: Right;  . SHOULDER SURGERY     left shoulder rotator cuff repair  . UPPER GI ENDOSCOPY          Home Medications    Prior to Admission medications   Medication Sig Start Date End Date Taking? Authorizing Provider  aspirin EC 81 MG tablet Take 81 mg by mouth at bedtime.   Yes [provider]  atorvastatin (LIPITOR) 80 MG tablet TAKE 1 TABLET BY MOUTH  DAILY AT 6PM Patient taking differently: Take 80 mg by mouth daily at 6 PM.  08/23/17  Yes [name], MD  clopidogrel (PLAVIX) 75 MG tablet TAKE 1 TABLET  BY MOUTH AT  BEDTIME Patient taking differently: Take 75 mg by mouth at bedtime.  08/23/17  Yes [name], MD  fenofibrate 160 MG tablet Take 160 mg by mouth at bedtime.   Yes [provider]  metoprolol succinate (TOPROL-XL) 25 MG 24 hr tablet Take 1 tablet (25 mg total) by mouth daily. Please make yearly appt with Dr. [name] for May before anymore refills. 1st attempt 06/05/17  Yes [name], MD  niacin (NIASPAN) 750 MG CR tablet Take 1,500 mg by mouth at bedtime.   Yes [provider]  nitroGLYCERIN (NITROSTAT)  0.4 MG SL tablet Place 0.4 mg under the tongue every 5 (five) minutes as needed. For chest pain   Yes [provider]  pantoprazole (PROTONIX) 40 MG tablet Take 40 mg by mouth at bedtime.   Yes [provider]  traMADol (ULTRAM) 50 MG tablet Take 50 mg by mouth every 12 (twelve) hours as needed for moderate pain.  06/20/16  Yes [provider]    Family History Family History  Problem Relation Age of Onset  . Colon cancer Mother   . Heart attack Father        , massive  . Coronary artery disease Brother   . Thyroid cancer Brother     Social History Social History   Tobacco Use  . Smoking status: Former Smoker    Years: 35.00    Types: Cigarettes  . Smokeless tobacco: Never Used  Substance Use Topics  . Alcohol use: No  . Drug use: No     Allergies   Ciprofloxacin and Daypro [oxaprozin]   Review of Systems Review of Systems  Constitutional:       Per HPI, otherwise negative  HENT:       Per HPI, otherwise negative  Respiratory:       Per HPI, otherwise negative  Cardiovascular:       Per HPI, otherwise negative  Gastrointestinal: Negative for vomiting.  Endocrine:       Negative aside from HPI  Genitourinary:       Neg aside from HPI   Musculoskeletal:       Per HPI, otherwise negative  Skin: Negative.   Neurological: Positive for dizziness, weakness, light-headedness and headaches. Negative for syncope.     Physical Exam Updated Vital Signs BP 131/69   Pulse 69   Temp 97.7 F (36.5 C) (Oral)   Resp (!) 23   Ht 5\' 8"  (1.727 m)   Wt 93.4 kg   SpO2 96%   BMI 31.32 kg/m   Physical Exam  Constitutional: He is oriented to person, place, and time. He appears well-developed. No distress.  HENT:  Head: Normocephalic and atraumatic.  Eyes: Conjunctivae and EOM are normal.  Cardiovascular: Normal rate and regular rhythm.  Pulmonary/Chest: Effort normal. No stridor. No respiratory distress.  Abdominal: He exhibits no distension.     Musculoskeletal: He exhibits no edema.       Arms: Neurological: He is alert and oriented to person, place, and time.  Patient has preserved sensation in both lower extremities distally, moves distal lower extremities appropriately, has 4/5 strength in both lower extremities with hip flexion, as well as pain elicited in the lower back.   Skin: Skin is warm and dry.  Psychiatric: He has a normal mood and affect.  Nursing note and vitals reviewed.    ED Treatments / Results  Labs (all labs ordered are listed, but only abnormal results are  displayed) Labs Reviewed  URINALYSIS, ROUTINE W REFLEX MICROSCOPIC - Abnormal; Notable for the following components:      Result Value   Color, Urine STRAW (*)    All other components within normal limits  BASIC METABOLIC PANEL - Abnormal; Notable for the following components:   Glucose, Bld 124 (*)    BUN 7 (*)    All other components within normal limits  CBC WITH DIFFERENTIAL/PLATELET  PROTIME-INR    EKG EKG Interpretation  Date/Time:  Wednesday October 04 2017 15:15:01 EDT Ventricular Rate:  67 PR Interval:    QRS Duration: 101 QT Interval:  366 QTC Calculation: 387 R Axis:   -6 Text Interpretation:  Sinus rhythm Artifact Otherwise within normal limits Confirmed by Carmin Muskrat [phone]) on 10/04/2017 3:22:44 PM   Radiology Mr Lumbar Spine W Wo Contrast  Result Date: 10/04/2017 CLINICAL DATA:  New lower extremity weakness following injection. EXAM: MRI LUMBAR SPINE WITHOUT AND WITH CONTRAST TECHNIQUE: Multiplanar and multiecho pulse sequences of the lumbar spine were obtained without and with intravenous contrast. CONTRAST:  82mL MULTIHANCE GADOBENATE DIMEGLUMINE 529 MG/ML IV SOLN COMPARISON:  Lumbar spine MRI 07/07/2016 FINDINGS: Segmentation: Normal. The lowest disc space is considered to be L5-S1. Alignment:  Normal Vertebrae:  L2-L3 PLIF.  There is anterior fusion at L3-4 and L4-5. Conus medullaris and cauda equina: The conus  medullaris terminates at the L1 level. The cauda equina and conus medullaris are both normal. Paraspinal and other soft tissues: Unchanged small fluid collection dorsal to L4. Disc levels: T12-L1: Imaged only in the sagittal plane.  Normal. L1-L2: Disc desiccation with mild facet hypertrophy. No spinal canal stenosis. Moderate right and mild left neural foraminal stenosis, unchanged. L2-L3: Postfusion changes without spinal canal stenosis. Neural foramina are patent. L3-L4: Posterior decompression with widely patent spinal canal. No neural foraminal stenosis. L4-L5: Posterior decompression without spinal canal stenosis. Mild bilateral foraminal narrowing, unchanged. L5-S1: Severe bilateral facet hypertrophy with unchanged moderate narrowing of the spinal canal. There is no neural foraminal stenosis. There is mild contrast enhancement of the left S1 nerve root, unchanged. Left subarticular component of the L5-S1 disc remains in close proximity to this nerve root. IMPRESSION: 1. Unchanged moderate spinal canal stenosis at L5-S1 with persistent contrast-enhancement of the left S1 nerve root, possibly due to chronic irritation by adjacent subarticular disc component. 2. Unchanged L1-2 and L4-5 foraminal stenosis. Electronically Signed   By: Ulyses Jarred M.D.   On: 10/04/2017 20:41    Procedures Procedures (including critical care time)  Medications Ordered in ED Medications  gadobenate dimeglumine (MULTIHANCE) injection 20 mL (20 mLs Intravenous Contrast Given 10/04/17 2029)     Initial Impression / Assessment and Plan / ED Course  I have reviewed the triage vital signs and the nursing notes.  Pertinent labs & imaging results that were available during my care of the patient were reviewed by me and considered in my medical decision making (see chart for details).     9:17 PM Patient now ambulatory, leg pain has improved. We reviewed the MRI findings, discussed it with him, his wife, specifically his  lesions in L4-L5 and L5-S1.  This elderly male presents same day as receiving spinal injection with new atypical bilateral lower extremity weakness, pain. Patient also has lightheadedness, dizziness, all atypical for him though he has received multiple prior injections. Given the patient's new neurologic phenomena, recent injection, MRI was performed, which was generally reassuring aside from demonstration of multiple arthritic and age-related changes. Patient also improved substantially  here, and given this improvement, absence of alarming findings on MRI, he was discharged with close outpatient follow-up.  Final Clinical Impressions(s) / ED Diagnoses  Lower extremity weakness   Carmin Muskrat, MD 10/04/17 2119

## 2017-10-04 NOTE — ED Notes (Signed)
Pt ambulated in hallway a total of 169ft. No complaints of dizziness, tingling, weakness; no observable gait abnormalities. Pt initially complained of some light headedness when standing up, but quickly recovered. MD at bedside at this time.

## 2017-10-04 NOTE — ED Triage Notes (Addendum)
PT arrives via EMS from [facility]. Pt had an epidural for pain in his left leg. Numbness usually lasts 15 min. Procedure ended around 12:45pm today and pt states he has felt numb in bilateral legs ever since. Pt complained of some dizziness when he stood. Unable to walk/unbalanced due to numbness in his leg. BP 172/87, HR 64, 96% on room air.

## 2017-10-04 NOTE — Discharge Instructions (Signed)
As discussed, your evaluation today has been largely reassuring.  But, it is important that you monitor your condition carefully, and do not hesitate to return to the ED if you develop new, or concerning changes in your condition. ? ?Otherwise, please follow-up with your physician for appropriate ongoing care. ? ?

## 2017-10-17 ENCOUNTER — Other Ambulatory Visit: Payer: Self-pay | Admitting: Cardiology

## 2017-12-19 ENCOUNTER — Telehealth: Payer: Self-pay

## 2017-12-19 NOTE — Telephone Encounter (Signed)
   Primary Cardiologist: Candee Furbish, MD  Chart reviewed as part of pre-operative protocol coverage. Dr. [name], can patient hold Plavix and how long? requesting to hold for 7 days. Please forward your response to [facility] PREOP.   Thank you  Leanor Kail, PA 12/19/2017, 3:16 PM

## 2017-12-19 NOTE — Telephone Encounter (Signed)
   [facility] Pre-operative Risk Assessment    Request for surgical clearance:  1. What type of surgery is being performed? Interlaminar Spinal Injection   2. When is this surgery scheduled? TBD   3. What type of clearance is required (medical clearance vs. Pharmacy clearance to hold med vs. Both)? Pharmacy  4. Are there any medications that need to be held prior to surgery and how long? Plavix for 7 days prior   5. Practice name and name of physician performing surgery? Dr. Brien Few.[facility]   6. What is your office phone number [phone]    7.   What is your office fax number        [phone]  8.   Anesthesia type (None, local, MAC, general) ?

## 2017-12-20 NOTE — Telephone Encounter (Signed)
He can hold his Plavix for 7 days prior to spinal injection. Candee Furbish, MD

## 2018-01-03 DIAGNOSIS — M5126 Other intervertebral disc displacement, lumbar region: Secondary | ICD-10-CM | POA: Diagnosis not present

## 2018-01-03 DIAGNOSIS — M5416 Radiculopathy, lumbar region: Secondary | ICD-10-CM | POA: Diagnosis not present

## 2018-01-23 ENCOUNTER — Other Ambulatory Visit: Payer: Self-pay | Admitting: Cardiology

## 2018-01-23 MED ORDER — METOPROLOL SUCCINATE ER 25 MG PO TB24: 25.0000 mg | ORAL_TABLET | Freq: Every day | ORAL | 2 refills | Status: DC

## 2018-04-16 DIAGNOSIS — E119 Type 2 diabetes mellitus without complications: Secondary | ICD-10-CM | POA: Diagnosis not present

## 2018-04-16 DIAGNOSIS — H524 Presbyopia: Secondary | ICD-10-CM | POA: Diagnosis not present

## 2018-04-16 DIAGNOSIS — Z961 Presence of intraocular lens: Secondary | ICD-10-CM | POA: Diagnosis not present

## 2018-05-17 DIAGNOSIS — M549 Dorsalgia, unspecified: Secondary | ICD-10-CM | POA: Diagnosis not present

## 2018-05-17 DIAGNOSIS — Z0000 Encounter for general adult medical examination without abnormal findings: Secondary | ICD-10-CM | POA: Diagnosis not present

## 2018-05-17 DIAGNOSIS — K2270 Barrett's esophagus without dysplasia: Secondary | ICD-10-CM | POA: Diagnosis not present

## 2018-05-17 DIAGNOSIS — I739 Peripheral vascular disease, unspecified: Secondary | ICD-10-CM | POA: Diagnosis not present

## 2018-05-17 DIAGNOSIS — I119 Hypertensive heart disease without heart failure: Secondary | ICD-10-CM | POA: Diagnosis not present

## 2018-05-17 DIAGNOSIS — I25119 Atherosclerotic heart disease of native coronary artery with unspecified angina pectoris: Secondary | ICD-10-CM | POA: Diagnosis not present

## 2018-05-17 DIAGNOSIS — M1219 Kaschin-Beck disease, multiple sites: Secondary | ICD-10-CM | POA: Diagnosis not present

## 2018-05-17 DIAGNOSIS — L409 Psoriasis, unspecified: Secondary | ICD-10-CM | POA: Diagnosis not present

## 2018-05-17 DIAGNOSIS — I700 Atherosclerosis of aorta: Secondary | ICD-10-CM | POA: Diagnosis not present

## 2018-05-17 DIAGNOSIS — E782 Mixed hyperlipidemia: Secondary | ICD-10-CM | POA: Diagnosis not present

## 2018-05-17 DIAGNOSIS — Z125 Encounter for screening for malignant neoplasm of prostate: Secondary | ICD-10-CM | POA: Diagnosis not present

## 2018-05-17 DIAGNOSIS — E1151 Type 2 diabetes mellitus with diabetic peripheral angiopathy without gangrene: Secondary | ICD-10-CM | POA: Diagnosis not present

## 2018-05-28 DIAGNOSIS — M25571 Pain in right ankle and joints of right foot: Secondary | ICD-10-CM | POA: Diagnosis not present

## 2018-06-20 DIAGNOSIS — Z9889 Other specified postprocedural states: Secondary | ICD-10-CM | POA: Insufficient documentation

## 2018-06-20 DIAGNOSIS — M25511 Pain in right shoulder: Secondary | ICD-10-CM | POA: Insufficient documentation

## 2018-06-22 DIAGNOSIS — M25511 Pain in right shoulder: Secondary | ICD-10-CM | POA: Diagnosis not present

## 2018-06-22 DIAGNOSIS — M7541 Impingement syndrome of right shoulder: Secondary | ICD-10-CM | POA: Diagnosis not present

## 2018-06-22 DIAGNOSIS — Z9889 Other specified postprocedural states: Secondary | ICD-10-CM | POA: Diagnosis not present

## 2018-07-02 DIAGNOSIS — N419 Inflammatory disease of prostate, unspecified: Secondary | ICD-10-CM | POA: Diagnosis not present

## 2018-07-26 ENCOUNTER — Other Ambulatory Visit: Payer: Self-pay | Admitting: Family Medicine

## 2018-07-26 DIAGNOSIS — R109 Unspecified abdominal pain: Secondary | ICD-10-CM | POA: Diagnosis not present

## 2018-07-31 ENCOUNTER — Other Ambulatory Visit: Payer: Self-pay

## 2018-07-31 ENCOUNTER — Ambulatory Visit
Admission: RE | Admit: 2018-07-31 | Discharge: 2018-07-31 | Disposition: A | Discharge status: Home / Self Care | Payer: Medicare Other | Source: Ambulatory Visit | Attending: Family Medicine | Admitting: Family Medicine

## 2018-07-31 DIAGNOSIS — R109 Unspecified abdominal pain: Secondary | ICD-10-CM

## 2018-07-31 MED ORDER — IOPAMIDOL (ISOVUE-300) INJECTION 61%
100.0000 mL | Freq: Once | INTRAVENOUS | Status: AC | PRN
  Administered 2018-07-31: 100 mL via INTRAVENOUS

## 2018-08-03 DIAGNOSIS — R197 Diarrhea, unspecified: Secondary | ICD-10-CM | POA: Diagnosis not present

## 2018-08-03 DIAGNOSIS — K5792 Diverticulitis of intestine, part unspecified, without perforation or abscess without bleeding: Secondary | ICD-10-CM | POA: Diagnosis not present

## 2018-08-03 DIAGNOSIS — K2270 Barrett's esophagus without dysplasia: Secondary | ICD-10-CM | POA: Diagnosis not present

## 2018-08-03 DIAGNOSIS — Z86010 Personal history of colonic polyps: Secondary | ICD-10-CM | POA: Diagnosis not present

## 2018-08-08 DIAGNOSIS — Z86010 Personal history of colonic polyps: Secondary | ICD-10-CM | POA: Diagnosis not present

## 2018-08-08 DIAGNOSIS — K5792 Diverticulitis of intestine, part unspecified, without perforation or abscess without bleeding: Secondary | ICD-10-CM | POA: Diagnosis not present

## 2018-08-08 DIAGNOSIS — R102 Pelvic and perineal pain: Secondary | ICD-10-CM | POA: Diagnosis not present

## 2018-08-08 DIAGNOSIS — R300 Dysuria: Secondary | ICD-10-CM | POA: Diagnosis not present

## 2018-08-08 DIAGNOSIS — K2270 Barrett's esophagus without dysplasia: Secondary | ICD-10-CM | POA: Diagnosis not present

## 2018-08-13 ENCOUNTER — Other Ambulatory Visit: Payer: Self-pay | Admitting: Physician Assistant

## 2018-08-13 DIAGNOSIS — K5792 Diverticulitis of intestine, part unspecified, without perforation or abscess without bleeding: Secondary | ICD-10-CM

## 2018-08-13 DIAGNOSIS — R102 Pelvic and perineal pain: Secondary | ICD-10-CM

## 2018-08-29 ENCOUNTER — Ambulatory Visit
Admission: RE | Admit: 2018-08-29 | Discharge: 2018-08-29 | Disposition: A | Discharge status: Home / Self Care | Payer: Medicare Other | Source: Ambulatory Visit | Attending: Physician Assistant | Admitting: Physician Assistant

## 2018-08-29 DIAGNOSIS — R102 Pelvic and perineal pain unspecified side: Secondary | ICD-10-CM

## 2018-08-29 DIAGNOSIS — K5792 Diverticulitis of intestine, part unspecified, without perforation or abscess without bleeding: Secondary | ICD-10-CM

## 2018-08-29 MED ORDER — IOPAMIDOL (ISOVUE-300) INJECTION 61%
100.0000 mL | Freq: Once | INTRAVENOUS | Status: AC | PRN
  Administered 2018-08-29: 100 mL via INTRAVENOUS

## 2018-09-17 ENCOUNTER — Other Ambulatory Visit: Payer: Self-pay | Admitting: Cardiology

## 2018-09-18 ENCOUNTER — Telehealth: Payer: Self-pay | Admitting: Cardiology

## 2018-09-18 DIAGNOSIS — I359 Nonrheumatic aortic valve disorder, unspecified: Secondary | ICD-10-CM

## 2018-09-18 NOTE — Telephone Encounter (Signed)
Last office visit with Dr [name] was 09/22/2017 and at this time the recommendation was to return in 1 year.  Pt's last echo in our system was 11/2014 - demonstrating NL EF, trivial mitral and tricuspid regurgitation with moderately thickened aortic valve.  No mention of any need to repeat.  Last lab scanned from [facility] 05/17/2018 that includes CMP/Lipid.  Will have Dr [name] review and notify the patient of any needed testing.

## 2018-09-18 NOTE — Telephone Encounter (Signed)
°  The patient called to set up his yearly appointment with Dr. [name]. His wife's insurance is changing, so he would like any tests or labs to be done before his appointment om 09/21.  He states he normally gets an Echo and blood work. If Dr. [name] wants him to have those things done again, let him know and he will get them set up.

## 2018-09-19 NOTE — Telephone Encounter (Signed)
Let's check ECHO since moderate aortic valve thickening was noted 4 years ago.  Since labs were done 04/2018, no need to repeat with me.   Candee Furbish, MD

## 2018-09-20 NOTE — Telephone Encounter (Signed)
Pt is aware of orders for echo and that he will be contacted to be scheduled.  All questions if any where answered at the time of the call.

## 2018-09-21 ENCOUNTER — Other Ambulatory Visit: Payer: Self-pay | Admitting: Cardiology

## 2018-09-26 DIAGNOSIS — K5720 Diverticulitis of large intestine with perforation and abscess without bleeding: Secondary | ICD-10-CM | POA: Diagnosis not present

## 2018-09-26 DIAGNOSIS — K2270 Barrett's esophagus without dysplasia: Secondary | ICD-10-CM | POA: Diagnosis not present

## 2018-09-28 ENCOUNTER — Other Ambulatory Visit: Payer: Self-pay | Admitting: Physician Assistant

## 2018-09-28 ENCOUNTER — Ambulatory Visit (HOSPITAL_COMMUNITY): Payer: 59 | Attending: Internal Medicine

## 2018-09-28 ENCOUNTER — Other Ambulatory Visit: Payer: Self-pay

## 2018-09-28 DIAGNOSIS — I359 Nonrheumatic aortic valve disorder, unspecified: Secondary | ICD-10-CM

## 2018-09-28 DIAGNOSIS — K5720 Diverticulitis of large intestine with perforation and abscess without bleeding: Secondary | ICD-10-CM

## 2018-10-01 ENCOUNTER — Ambulatory Visit
Admission: RE | Admit: 2018-10-01 | Discharge: 2018-10-01 | Disposition: A | Discharge status: Home / Self Care | Payer: Medicare Other | Source: Ambulatory Visit | Attending: Physician Assistant | Admitting: Physician Assistant

## 2018-10-01 DIAGNOSIS — K5720 Diverticulitis of large intestine with perforation and abscess without bleeding: Secondary | ICD-10-CM

## 2018-10-01 MED ORDER — IOPAMIDOL (ISOVUE-300) INJECTION 61%
100.0000 mL | Freq: Once | INTRAVENOUS | Status: AC | PRN
  Administered 2018-10-01: 100 mL via INTRAVENOUS

## 2018-10-08 DIAGNOSIS — K5732 Diverticulitis of large intestine without perforation or abscess without bleeding: Secondary | ICD-10-CM | POA: Diagnosis not present

## 2018-11-03 ENCOUNTER — Emergency Department (HOSPITAL_COMMUNITY): Payer: 59

## 2018-11-03 ENCOUNTER — Inpatient Hospital Stay (HOSPITAL_COMMUNITY)
Admission: EM | Admit: 2018-11-03 | Discharge: 2018-11-10 | DRG: 872 | Disposition: A | Discharge status: Home Health | Payer: 59 | Attending: Family Medicine | Admitting: Family Medicine

## 2018-11-03 ENCOUNTER — Encounter (HOSPITAL_COMMUNITY): Payer: Self-pay | Admitting: *Deleted

## 2018-11-03 ENCOUNTER — Other Ambulatory Visit: Payer: Self-pay

## 2018-11-03 DIAGNOSIS — Z6826 Body mass index (BMI) 26.0-26.9, adult: Secondary | ICD-10-CM | POA: Diagnosis not present

## 2018-11-03 DIAGNOSIS — R627 Adult failure to thrive: Secondary | ICD-10-CM | POA: Diagnosis present

## 2018-11-03 DIAGNOSIS — Z8249 Family history of ischemic heart disease and other diseases of the circulatory system: Secondary | ICD-10-CM

## 2018-11-03 DIAGNOSIS — K5732 Diverticulitis of large intestine without perforation or abscess without bleeding: Secondary | ICD-10-CM

## 2018-11-03 DIAGNOSIS — N179 Acute kidney failure, unspecified: Secondary | ICD-10-CM | POA: Diagnosis not present

## 2018-11-03 DIAGNOSIS — R000 Tachycardia, unspecified: Secondary | ICD-10-CM | POA: Diagnosis not present

## 2018-11-03 DIAGNOSIS — A419 Sepsis, unspecified organism: Secondary | ICD-10-CM | POA: Diagnosis not present

## 2018-11-03 DIAGNOSIS — K219 Gastro-esophageal reflux disease without esophagitis: Secondary | ICD-10-CM | POA: Diagnosis present

## 2018-11-03 DIAGNOSIS — Z86010 Personal history of colonic polyps: Secondary | ICD-10-CM | POA: Diagnosis not present

## 2018-11-03 DIAGNOSIS — R630 Anorexia: Secondary | ICD-10-CM | POA: Diagnosis present

## 2018-11-03 DIAGNOSIS — E860 Dehydration: Secondary | ICD-10-CM | POA: Diagnosis present

## 2018-11-03 DIAGNOSIS — Z7902 Long term (current) use of antithrombotics/antiplatelets: Secondary | ICD-10-CM

## 2018-11-03 DIAGNOSIS — H8110 Benign paroxysmal vertigo, unspecified ear: Secondary | ICD-10-CM | POA: Diagnosis not present

## 2018-11-03 DIAGNOSIS — I2510 Atherosclerotic heart disease of native coronary artery without angina pectoris: Secondary | ICD-10-CM | POA: Diagnosis present

## 2018-11-03 DIAGNOSIS — Z808 Family history of malignant neoplasm of other organs or systems: Secondary | ICD-10-CM

## 2018-11-03 DIAGNOSIS — Z955 Presence of coronary angioplasty implant and graft: Secondary | ICD-10-CM | POA: Diagnosis not present

## 2018-11-03 DIAGNOSIS — R42 Dizziness and giddiness: Secondary | ICD-10-CM

## 2018-11-03 DIAGNOSIS — Z79891 Long term (current) use of opiate analgesic: Secondary | ICD-10-CM

## 2018-11-03 DIAGNOSIS — E785 Hyperlipidemia, unspecified: Secondary | ICD-10-CM | POA: Diagnosis not present

## 2018-11-03 DIAGNOSIS — Z23 Encounter for immunization: Secondary | ICD-10-CM

## 2018-11-03 DIAGNOSIS — Z03818 Encounter for observation for suspected exposure to other biological agents ruled out: Secondary | ICD-10-CM | POA: Diagnosis not present

## 2018-11-03 DIAGNOSIS — E1151 Type 2 diabetes mellitus with diabetic peripheral angiopathy without gangrene: Secondary | ICD-10-CM | POA: Diagnosis present

## 2018-11-03 DIAGNOSIS — Z87891 Personal history of nicotine dependence: Secondary | ICD-10-CM

## 2018-11-03 DIAGNOSIS — R1110 Vomiting, unspecified: Secondary | ICD-10-CM | POA: Diagnosis not present

## 2018-11-03 DIAGNOSIS — I10 Essential (primary) hypertension: Secondary | ICD-10-CM | POA: Diagnosis present

## 2018-11-03 DIAGNOSIS — E440 Moderate protein-calorie malnutrition: Secondary | ICD-10-CM | POA: Diagnosis present

## 2018-11-03 DIAGNOSIS — R112 Nausea with vomiting, unspecified: Secondary | ICD-10-CM | POA: Diagnosis not present

## 2018-11-03 DIAGNOSIS — G8929 Other chronic pain: Secondary | ICD-10-CM | POA: Diagnosis present

## 2018-11-03 DIAGNOSIS — R6520 Severe sepsis without septic shock: Secondary | ICD-10-CM | POA: Diagnosis not present

## 2018-11-03 DIAGNOSIS — H5509 Other forms of nystagmus: Secondary | ICD-10-CM | POA: Diagnosis present

## 2018-11-03 DIAGNOSIS — M549 Dorsalgia, unspecified: Secondary | ICD-10-CM | POA: Diagnosis present

## 2018-11-03 DIAGNOSIS — Z800 Family history of malignant neoplasm of digestive organs: Secondary | ICD-10-CM

## 2018-11-03 DIAGNOSIS — E876 Hypokalemia: Secondary | ICD-10-CM | POA: Diagnosis not present

## 2018-11-03 DIAGNOSIS — Z20828 Contact with and (suspected) exposure to other viral communicable diseases: Secondary | ICD-10-CM | POA: Diagnosis present

## 2018-11-03 DIAGNOSIS — Z79899 Other long term (current) drug therapy: Secondary | ICD-10-CM

## 2018-11-03 DIAGNOSIS — Z881 Allergy status to other antibiotic agents status: Secondary | ICD-10-CM

## 2018-11-03 DIAGNOSIS — K5720 Diverticulitis of large intestine with perforation and abscess without bleeding: Secondary | ICD-10-CM | POA: Diagnosis present

## 2018-11-03 DIAGNOSIS — Z7982 Long term (current) use of aspirin: Secondary | ICD-10-CM

## 2018-11-03 DIAGNOSIS — Z888 Allergy status to other drugs, medicaments and biological substances status: Secondary | ICD-10-CM

## 2018-11-03 DIAGNOSIS — G4733 Obstructive sleep apnea (adult) (pediatric): Secondary | ICD-10-CM | POA: Diagnosis not present

## 2018-11-03 LAB — COMPREHENSIVE METABOLIC PANEL
ALT: 15 U/L (ref 0–44)
AST: 21 U/L (ref 15–41)
Albumin: 3.9 g/dL (ref 3.5–5.0)
Alkaline Phosphatase: 84 U/L (ref 38–126)
Anion gap: 17 — ABNORMAL HIGH (ref 5–15)
BUN: 7 mg/dL — ABNORMAL LOW (ref 8–23)
CO2: 23 mmol/L (ref 22–32)
Calcium: 9.9 mg/dL (ref 8.9–10.3)
Chloride: 101 mmol/L (ref 98–111)
Creatinine, Ser: 0.99 mg/dL (ref 0.61–1.24)
GFR calc Af Amer: >60 mL/min (ref >60)
GFR calc non Af Amer: >60 mL/min (ref >60)
Glucose, Bld: 158 mg/dL — ABNORMAL HIGH (ref 70–99)
Potassium: 3.1 mmol/L — ABNORMAL LOW (ref 3.5–5.1)
Sodium: 141 mmol/L (ref 135–145)
Total Bilirubin: 1.1 mg/dL (ref 0.3–1.2)
Total Protein: 7 g/dL (ref 6.5–8.1)

## 2018-11-03 LAB — SARS CORONAVIRUS 2 BY RT PCR (HOSPITAL ORDER, PERFORMED IN ~~LOC~~ HOSPITAL LAB): SARS Coronavirus 2: NEGATIVE

## 2018-11-03 LAB — LACTIC ACID, PLASMA
Lactic Acid, Venous: 2.4 mmol/L (ref 0.5–1.9)
Lactic Acid, Venous: 1.9 mmol/L (ref 0.5–1.9)

## 2018-11-03 LAB — CBC
HCT: 48.6 % (ref 39.0–52.0)
Hemoglobin: 16.7 g/dL (ref 13.0–17.0)
MCH: 31.9 pg (ref 26.0–34.0)
MCHC: 34.4 g/dL (ref 30.0–36.0)
MCV: 92.7 fL (ref 80.0–100.0)
Platelets: 293 10*3/uL (ref 150–400)
RBC: 5.24 MIL/uL (ref 4.22–5.81)
RDW: 13 % (ref 11.5–15.5)
WBC: 11.1 10*3/uL — ABNORMAL HIGH (ref 4.0–10.5)
nRBC: 0 % (ref 0.0–0.2)

## 2018-11-03 LAB — PROCALCITONIN: Procalcitonin: <0.1 ng/mL

## 2018-11-03 LAB — TROPONIN I (HIGH SENSITIVITY): Troponin I (High Sensitivity): 5 ng/L (ref <18)

## 2018-11-03 MED ORDER — ONDANSETRON 4 MG PO TBDP
4.0000 mg | ORAL_TABLET | Freq: Once | ORAL | Status: AC | PRN
  Administered 2018-11-03: 4 mg via ORAL
  Filled 2018-11-03: qty 1

## 2018-11-03 MED ORDER — METOCLOPRAMIDE HCL 5 MG/ML IJ SOLN
10.0000 mg | Freq: Once | INTRAMUSCULAR | Status: AC
  Administered 2018-11-03: 10 mg via INTRAVENOUS
  Filled 2018-11-03: qty 2

## 2018-11-03 MED ORDER — SODIUM CHLORIDE 0.9 % IV SOLN
2.0000 g | Freq: Once | INTRAVENOUS | Status: AC
  Administered 2018-11-03: 17:00:00 2 g via INTRAVENOUS
  Filled 2018-11-03: qty 2

## 2018-11-03 MED ORDER — SODIUM CHLORIDE 0.9 % IV SOLN
INTRAVENOUS | Status: DC
  Administered 2018-11-03: 17:00:00 via INTRAVENOUS

## 2018-11-03 MED ORDER — ONDANSETRON HCL 4 MG PO TABS: 4.0000 mg | ORAL_TABLET | Freq: Four times a day (QID) | ORAL | Status: DC | PRN

## 2018-11-03 MED ORDER — ACETAMINOPHEN 650 MG RE SUPP: 650.0000 mg | Freq: Four times a day (QID) | RECTAL | Status: DC | PRN

## 2018-11-03 MED ORDER — IOHEXOL 300 MG/ML  SOLN
100.0000 mL | Freq: Once | INTRAMUSCULAR | Status: AC | PRN
  Administered 2018-11-03: 100 mL via INTRAVENOUS

## 2018-11-03 MED ORDER — METOPROLOL SUCCINATE ER 25 MG PO TB24: 25.0000 mg | ORAL_TABLET | Freq: Every day | ORAL | Status: DC

## 2018-11-03 MED ORDER — PROMETHAZINE HCL 25 MG/ML IJ SOLN
12.5000 mg | Freq: Once | INTRAMUSCULAR | Status: AC
  Administered 2018-11-03: 12.5 mg via INTRAVENOUS
  Filled 2018-11-03: qty 1

## 2018-11-03 MED ORDER — METRONIDAZOLE IN NACL 5-0.79 MG/ML-% IV SOLN
500.0000 mg | Freq: Three times a day (TID) | INTRAVENOUS | Status: DC
  Administered 2018-11-03 – 2018-11-04 (×2): 500 mg via INTRAVENOUS
  Filled 2018-11-03 (×3): qty 100

## 2018-11-03 MED ORDER — PIPERACILLIN-TAZOBACTAM 3.375 G IVPB 30 MIN
3.3750 g | Freq: Once | INTRAVENOUS | Status: AC
  Administered 2018-11-03: 3.375 g via INTRAVENOUS
  Filled 2018-11-03: qty 50

## 2018-11-03 MED ORDER — POTASSIUM CHLORIDE IN NACL 40-0.9 MEQ/L-% IV SOLN
INTRAVENOUS | Status: DC
  Administered 2018-11-03 – 2018-11-04 (×2): 100 mL/h via INTRAVENOUS
  Administered 2018-11-05 (×2): 125 mL/h via INTRAVENOUS
  Filled 2018-11-03 (×6): qty 1000

## 2018-11-03 MED ORDER — SODIUM CHLORIDE 0.9% FLUSH
3.0000 mL | Freq: Two times a day (BID) | INTRAVENOUS | Status: DC
  Administered 2018-11-04 – 2018-11-09 (×5): 3 mL via INTRAVENOUS

## 2018-11-03 MED ORDER — SODIUM CHLORIDE 0.9 % IV SOLN
2.0000 g | Freq: Three times a day (TID) | INTRAVENOUS | Status: DC
  Administered 2018-11-04: 2 g via INTRAVENOUS
  Filled 2018-11-03 (×3): qty 2

## 2018-11-03 MED ORDER — SODIUM CHLORIDE 0.9 % IV BOLUS
1000.0000 mL | Freq: Once | INTRAVENOUS | Status: AC
  Administered 2018-11-03: 1000 mL via INTRAVENOUS

## 2018-11-03 MED ORDER — MORPHINE SULFATE (PF) 2 MG/ML IV SOLN: 2.0000 mg | INTRAVENOUS | Status: DC | PRN

## 2018-11-03 MED ORDER — ONDANSETRON HCL 4 MG/2ML IJ SOLN
4.0000 mg | Freq: Once | INTRAMUSCULAR | Status: AC
  Administered 2018-11-03: 4 mg via INTRAVENOUS
  Filled 2018-11-03: qty 2

## 2018-11-03 MED ORDER — ONDANSETRON HCL 4 MG/2ML IJ SOLN
4.0000 mg | Freq: Four times a day (QID) | INTRAMUSCULAR | Status: DC | PRN
  Administered 2018-11-03 – 2018-11-09 (×5): 4 mg via INTRAVENOUS
  Filled 2018-11-03 (×5): qty 2

## 2018-11-03 MED ORDER — ACETAMINOPHEN 325 MG PO TABS
650.0000 mg | ORAL_TABLET | Freq: Four times a day (QID) | ORAL | Status: DC | PRN
  Administered 2018-11-06: 650 mg via ORAL
  Filled 2018-11-03: qty 2

## 2018-11-03 MED ORDER — LORAZEPAM 2 MG/ML IJ SOLN
1.0000 mg | Freq: Once | INTRAMUSCULAR | Status: AC
  Administered 2018-11-03: 1 mg via INTRAVENOUS
  Filled 2018-11-03: qty 1

## 2018-11-03 MED ORDER — ATORVASTATIN CALCIUM 80 MG PO TABS
80.0000 mg | ORAL_TABLET | Freq: Every day | ORAL | Status: DC
  Administered 2018-11-05 – 2018-11-09 (×5): 80 mg via ORAL
  Filled 2018-11-03 (×5): qty 1

## 2018-11-03 NOTE — Progress Notes (Addendum)
Pharmacy Antibiotic Note  Ray Brown is a 68 y.o. male admitted on 11/03/2018 with intra-abdominal infection.  Pharmacy has been consulted for cefepime dosing.  WBC 11.1, afebrile. Scr 0.99 (CrCl 68 mL/min). CT image showing no significant change in mild distal sigmoid diverticulitis. Seen by [facility] for past several month for diverticulitis - has had 2 rounds of antibiotics (last completed in beginning of Aug).  Plan: Cefepime 2 g IV every 8 hours  Monitor renal fx, cx results, clinical pic  Height: 5\' 8"  (172.7 cm) Weight: 179 lb 14.3 oz (81.6 kg) IBW/kg (Calculated) : 68.4  Temp (24hrs), Avg:98.1 F (36.7 C), Min:98.1 F (36.7 C), Max:98.1 F (36.7 C)  Recent Labs  Lab 11/03/18 0648  WBC 11.1*  CREATININE 0.99    Estimated Creatinine Clearance: 68.1 mL/min (by C-G formula based on SCr of 0.99 mg/dL).    Allergies  Allergen Reactions  . Ciprofloxacin Rash  . Daypro [Oxaprozin] Rash    Antimicrobials this admission: Cefepime 9/12 >>  Zosyn 9/12 x1  Dose adjustments this admission: N/A  Microbiology results: 9/12 COVID: sent  Thank you for allowing pharmacy to be a part of this patient's care.  Antonietta Jewel, PharmD, BCCCP Clinical Pharmacist  Phone: [phone]  Please check AMION for all [place] phone numbers After 10:00 PM, call [facility] [phone] 11/03/2018 4:32 PM

## 2018-11-03 NOTE — ED Triage Notes (Signed)
The pt is c/o nausea and vomiting since 2000 no diarrhea  No pain  Hx diverticulosis

## 2018-11-03 NOTE — Progress Notes (Signed)
Pt vomited 500 cc clear emesis. Pt states it tastes like saline. K 3.1. Bladder scanned pt on admission to this unit with no urine in bladder. MD notified. Verbal order for 1045ml saline with 40 of K given over an hour.   Lucius Conn, RN

## 2018-11-03 NOTE — ED Provider Notes (Signed)
[facility] EMERGENCY DEPARTMENT Provider Note   CSN: [id number] Arrival date & time: 11/03/18  [id number]     History   Chief Complaint Chief Complaint  Patient presents with  . Emesis    HPI ATOM Ray Brown is a 68 y.o. male.     68yo M w/ PMH including PVD, T2DM, CAD, OSA, diverticulitis, GERD who p/w vomiting.  Patient states that he began having vomiting yesterday evening and has had persistent vomiting every hour since then.  He notes that he has been following with [facility] over the past several months for problems with diverticulitis and wife states that he has had 2 rounds of antibiotics, last completed at the beginning of August.  He has had a 30 pound weight loss since the beginning of the year and intermittent problems with vomiting.  He denies any fevers.  He has intermittent constipation and diarrhea but no blood in his stool.  No cough/cold symptoms.  No medications prior to arrival.  The history is provided by the patient.  Emesis   Past Medical History:  Diagnosis Date  . Arthritis   . Bronchitis   . Chronic back pain   . Complication of anesthesia    difficulty breathing s/p back surgery at [facility] 2013  . Coronary artery disease   . Diabetes mellitus without complication ([place])   . Dry skin    in the back of head-left side  . GERD (gastroesophageal reflux disease)   . H/O hiatal hernia   . Hypercholesteremia   . Peripheral vascular disease ([place])   . Pneumonia   . Shortness of breath   . Sleep apnea    has CPAP but does not use    Patient Active Problem List   Diagnosis Date Noted  . Diverticulitis of colon with perforation 11/03/2018  . Unstable angina ([place]) 12/05/2014  . Dyspnea 12/05/2014  . Dizziness 12/05/2014  . Diabetes mellitus with peripheral artery disease ([place]) 10/04/2013  . Coronary artery disease involving native coronary artery of native heart without angina pectoris 10/04/2013  . Impingement syndrome of right shoulder  03/28/2013  . Hyperlipemia 01/21/2013  . Obesity 01/21/2013  . Coronary artery disease   . Peripheral vascular disease [facility])     Past Surgical History:  Procedure Laterality Date  . aortic bypass  2003   aortobifemoral bypass  . BACK SURGERY  2004  . CARDIAC CATHETERIZATION  2010   x 2 stents RCA  . CARDIAC CATHETERIZATION N/A 12/08/2014   Procedure: Left Heart Cath and Coronary Angiography;  Surgeon: Lorretta Harp, MD;  Location: [facility];  Service: Cardiovascular;  Laterality: N/A;  . COLONOSCOPY  08/04/2009  . CORONARY ANGIOPLASTY     2 stents  . FOOT SURGERY  1980s   right foot  . SHOULDER ARTHROSCOPY WITH SUBACROMIAL DECOMPRESSION Right 03/28/2013   Procedure: RIGHT SHOULDER ARTHROSCOPY WITH SUBACROMIAL DECOMPRESSION AND DISTAL CLAVICLE RESECTION;  Surgeon: Marin Shutter, MD;  Location: [facility];  Service: Orthopedics;  Laterality: Right;  . SHOULDER SURGERY     left shoulder rotator cuff repair  . UPPER GI ENDOSCOPY          Home Medications    Prior to Admission medications   Medication Sig Start Date End Date Taking? Authorizing Provider  aspirin EC 81 MG tablet Take 81 mg by mouth at bedtime.   Yes [provider]  atorvastatin (LIPITOR) 80 MG tablet Take 1 tablet (80 mg total) by mouth at bedtime.  Please keep upcoming appt for future refills. Thank you 09/24/18  Yes [name], MD  clopidogrel (PLAVIX) 75 MG tablet Take 1 tablet (75 mg total) by mouth at bedtime. Please keep upcoming appt for future refills. Thank you 09/24/18  Yes [name], MD  fenofibrate 160 MG tablet Take 160 mg by mouth at bedtime.   Yes [provider]  metoprolol succinate (TOPROL-XL) 25 MG 24 hr tablet TAKE 1 TABLET BY MOUTH  DAILY 09/18/18  Yes [name], MD  niacin (NIASPAN) 750 MG CR tablet Take 1,500 mg by mouth at bedtime.   Yes [provider]  nitroGLYCERIN (NITROSTAT) 0.4 MG SL tablet Place 0.4 mg under the tongue every 5 (five) minutes as  needed. For chest pain   Yes [provider]  pantoprazole (PROTONIX) 40 MG tablet Take 40 mg by mouth at bedtime.   Yes [provider]  traMADol (ULTRAM) 50 MG tablet Take 50 mg by mouth every 12 (twelve) hours as needed for moderate pain.  06/20/16  Yes [provider]    Family History Family History  Problem Relation Age of Onset  . Colon cancer Mother   . Heart attack Father        , massive  . Coronary artery disease Brother   . Thyroid cancer Brother     Social History Social History   Tobacco Use  . Smoking status: Former Smoker    Years: 35.00    Types: Cigarettes  . Smokeless tobacco: Never Used  Substance Use Topics  . Alcohol use: No  . Drug use: No     Allergies   Ciprofloxacin and Daypro [oxaprozin]   Review of Systems Review of Systems  Gastrointestinal: Positive for vomiting.   All other systems reviewed and are negative except that which was mentioned in HPI   Physical Exam Updated Vital Signs BP 115/82   Pulse (!) 116   Temp 98.1 F (36.7 C) (Oral)   Resp (!) 30   Ht 5\' 8"  (1.727 m)   Wt 81.6 kg   SpO2 (!) 89%   BMI 27.35 kg/m   Physical Exam Vitals signs and nursing note reviewed.  Constitutional:      General: He is not in acute distress.    Appearance: He is well-developed.     Comments: Uncomfortable, holding emesis bag  HENT:     Head: Normocephalic and atraumatic.  Eyes:     Conjunctiva/sclera: Conjunctivae normal.  Neck:     Musculoskeletal: Neck supple.  Cardiovascular:     Rate and Rhythm: Regular rhythm. Tachycardia present.     Heart sounds: Normal heart sounds. No murmur.  Pulmonary:     Effort: Pulmonary effort is normal.     Breath sounds: Normal breath sounds.  Abdominal:     General: Bowel sounds are normal. There is no distension.     Palpations: Abdomen is soft.     Tenderness: There is abdominal tenderness (generalized). There is no guarding or rebound.  Musculoskeletal:      Right lower leg: No edema.     Left lower leg: No edema.  Skin:    General: Skin is warm and dry.  Neurological:     Mental Status: He is alert and oriented to person, place, and time.     Comments: Fluent speech  Psychiatric:        Judgment: Judgment normal.      ED Treatments / Results  Labs (all labs ordered  are listed, but only abnormal results are displayed) Labs Reviewed  COMPREHENSIVE METABOLIC PANEL - Abnormal; Notable for the following components:      Result Value   Potassium 3.1 (*)    Glucose, Bld 158 (*)    BUN 7 (*)    Anion gap 17 (*)    All other components within normal limits  CBC - Abnormal; Notable for the following components:   WBC 11.1 (*)    All other components within normal limits  SARS CORONAVIRUS 2 (HOSPITAL ORDER, [facility])  URINALYSIS, ROUTINE W REFLEX MICROSCOPIC  TROPONIN I (HIGH SENSITIVITY)    EKG EKG Interpretation  Date/Time:  Saturday November 03 2018 11:28:28 EDT Ventricular Rate:  110 PR Interval:    QRS Duration: 103 QT Interval:  347 QTC Calculation: 470 R Axis:   -30 Text Interpretation:  Sinus tachycardia Left axis deviation Minimal ST depression, inferior leads Baseline wander in lead(s) III aVF Confirmed by Theotis Burrow [phone]) on 11/03/2018 11:40:55 AM   Radiology Ct Abdomen Pelvis W Contrast  Result Date: 11/03/2018 CLINICAL DATA:  Worsening abdominal pain and tenderness. Worsening nausea and vomiting. Weight loss. Follow-up diverticulitis. EXAM: CT ABDOMEN AND PELVIS WITH CONTRAST TECHNIQUE: Multidetector CT imaging of the abdomen and pelvis was performed using the standard protocol following bolus administration of intravenous contrast. CONTRAST:  123mL OMNIPAQUE IOHEXOL 300 MG/ML  SOLN COMPARISON:  10/01/2018 FINDINGS: Lower Chest: No acute findings. Hepatobiliary: No hepatic masses identified. Gallbladder is unremarkable. No evidence of biliary ductal dilatation. Pancreas:  No mass or  inflammatory changes. Spleen: Within normal limits in size and appearance. Adrenals/Urinary Tract: No masses identified. No evidence of hydronephrosis. Stomach/Bowel: Mild diverticulitis again seen involving the distal sigmoid colon, without significant change. A tiny extraluminal gas and fluid collection is seen in the adjacent sigmoid mesentery measuring 1.6 cm on image 113/6. This is also stable and consistent with a micro perforation and small diverticular abscess. No evidence of bowel obstruction. Vascular/Lymphatic: No pathologically enlarged lymph nodes. Stable postop changes from previous aortobifemoral bypass grafting. Reproductive:  No mass or other significant abnormality. Other:  None. Musculoskeletal: No suspicious bone lesions identified. Prior lumbar spine fusion again noted. IMPRESSION: 1. No significant change in mild distal sigmoid diverticulitis, with micro-perforation and small diverticular abscess. 2. No other complication identified. Electronically Signed   By: Marlaine Hind M.D.   On: 11/03/2018 13:21    Procedures Procedures (including critical care time)  Medications Ordered in ED Medications  piperacillin-tazobactam (ZOSYN) IVPB 3.375 g (has no administration in time range)  ondansetron (ZOFRAN-ODT) disintegrating tablet 4 mg (4 mg Oral Given 11/03/18 0647)  ondansetron (ZOFRAN) injection 4 mg (4 mg Intravenous Given 11/03/18 0922)  sodium chloride 0.9 % bolus 1,000 mL (0 mLs Intravenous Stopped 11/03/18 1059)  iohexol (OMNIPAQUE) 300 MG/ML solution 100 mL (100 mLs Intravenous Contrast Given 11/03/18 1250)  metoCLOPramide (REGLAN) injection 10 mg (10 mg Intravenous Given 11/03/18 1059)  LORazepam (ATIVAN) injection 1 mg (1 mg Intravenous Given 11/03/18 1153)     Initial Impression / Assessment and Plan / ED Course  I have reviewed the triage vital signs and the nursing notes.  Pertinent labs & imaging results that were available during my care of the patient were reviewed by  me and considered in my medical decision making (see chart for details).      Non-toxic, afebrile. Mild generalized abd tenderness. I reviewed previous CT scan results showing diverticulitis with possible resolving fluid collection. DDx includes perforated  bowel, SBO, abscess. Labs show K 3.1, WBC 11.1. trop negative. CT shows persistent sigmoid diverticulitis with microperforation and small abscess.  I am concerned about his persistent signs of infection despite several rounds of antibiotics.  I recommended that he be admitted for IV antibiotics.  He may ultimately need surgical consultation.  He has continued to deny any pain including chest pain or shortness of breath.  Gave dose of Zosyn and discussed admission with [facility] hospitalist, Dr. Lorin Mercy.  Final Clinical Impressions(s) / ED Diagnoses   Final diagnoses:  Diverticulitis of large intestine with abscess without bleeding  Intractable vomiting with nausea, unspecified vomiting type    ED Discharge Orders    None       , Wenda Overland, MD 11/03/18 1530

## 2018-11-03 NOTE — ED Notes (Signed)
Wife Jamarrius Offield would like call when she can come in [phone]

## 2018-11-03 NOTE — ED Notes (Signed)
Patient transported to CT 

## 2018-11-03 NOTE — Progress Notes (Signed)
Pt states he has been vomiting since Thursday. Pt continues to vomit 500 cc clear emesis despite zofran. Also, per report pt has not gotten his Metoprolol because he cannot keep anything down. MD notified for route change and nausea medicine. Will continue to monitor closely.  Lucius Conn, RN

## 2018-11-03 NOTE — H&P (Signed)
History and Physical    Ray Brown [id number] DOB: 08-06-1950 DOA: 11/03/2018  PCP: [name], MD Consultants:  [name] - cardiology; [facility]; [facility] - neurosurgery; [facility] - GI Patient coming from:  Home - lives with wife; NOK: Wife, Olegario Shearer, [phone]  Chief Complaint: n/v  HPI: Ray Brown is a 68 y.o. male with medical history significant of OSA not on CPAP; PVD; HLD; DM; CAD; diverticulosis; and chronic back pain presenting with n/v.  He lost his appetite and has been feeling nauseated, has been unable to eat.  It comes in waves.  He saw his PCP and he had a CT with severe diverticulitis with microperf.  He had rx for antibiotics (?H pylori) maybe the end of July.  He returned for f/u and was no better and so was given medications again.  He was waking up in the middle of the night with vomiting and thought maybe the antibiotics were making things worse and did not complete the second round.  Last night about 8pm, he got very sick and has had recurrent n/v since.  No pain.  No fevers.  He did not have a BM yesterday or today but has had some loose stools prior.  The plan was for possible surgery if this didn't get better.  Records from [facility] are not available at this time.  Reports 30 pound weight loss.    ED Course:  Recurrent diverticulitis, followed by [facility] since June.  Several rounds of abx, most recently in early August.  Intermittent weight loss, n/v.  Worse last night with intractable vomiting.  No c/o pain, mild pain on exam.  No fevers.  CT with persistent diverticulitis with microperf, abscess.  Likely needs fluid, IV antibiotics, may need surgery consult at some point.   Review of Systems: As per HPI; otherwise review of systems reviewed and negative.   Ambulatory Status:  Ambulates without assistance  Past Medical History:  Diagnosis Date  . Arthritis   . Bronchitis   . Chronic back pain   . Complication of anesthesia    difficulty  breathing s/p back surgery at [facility] 2013  . Coronary artery disease   . Diabetes mellitus without complication ([place])   . Dry skin    in the back of head-left side  . GERD (gastroesophageal reflux disease)   . H/O hiatal hernia   . Hypercholesteremia   . Peripheral vascular disease ([place])   . Pneumonia   . Shortness of breath   . Sleep apnea    has CPAP but does not use    Past Surgical History:  Procedure Laterality Date  . aortic bypass  2003   aortobifemoral bypass  . BACK SURGERY  2004  . CARDIAC CATHETERIZATION  2010   x 2 stents RCA  . CARDIAC CATHETERIZATION N/A 12/08/2014   Procedure: Left Heart Cath and Coronary Angiography;  Surgeon: Lorretta Harp, MD;  Location: [facility];  Service: Cardiovascular;  Laterality: N/A;  . COLONOSCOPY  08/04/2009  . CORONARY ANGIOPLASTY     2 stents  . FOOT SURGERY  1980s   right foot  . SHOULDER ARTHROSCOPY WITH SUBACROMIAL DECOMPRESSION Right 03/28/2013   Procedure: RIGHT SHOULDER ARTHROSCOPY WITH SUBACROMIAL DECOMPRESSION AND DISTAL CLAVICLE RESECTION;  Surgeon: Marin Shutter, MD;  Location: [facility];  Service: Orthopedics;  Laterality: Right;  . SHOULDER SURGERY     left shoulder rotator cuff repair  . UPPER GI ENDOSCOPY      Social  History   Socioeconomic History  . Marital status: Married    Spouse name: Not on file  . Number of children: Not on file  . Years of education: Not on file  . Highest education level: Not on file  Occupational History  . Occupation: retired  Scientific laboratory technician  . Financial resource strain: Not on file  . Food insecurity    Worry: Not on file    Inability: Not on file  . Transportation needs    Medical: Not on file    Non-medical: Not on file  Tobacco Use  . Smoking status: Former Smoker    Years: 35.00    Types: Cigarettes  . Smokeless tobacco: Never Used  Substance and Sexual Activity  . Alcohol use: No  . Drug use: No  . Sexual activity: Not on file  Lifestyle  . Physical activity     Days per week: Not on file    Minutes per session: Not on file  . Stress: Not on file  Relationships  . Social Herbalist on phone: Not on file    Gets together: Not on file    Attends religious service: Not on file    Active member of club or organization: Not on file    Attends meetings of clubs or organizations: Not on file    Relationship status: Not on file  . Intimate partner violence    Fear of current or ex partner: Not on file    Emotionally abused: Not on file    Physically abused: Not on file    Forced sexual activity: Not on file  Other Topics Concern  . Not on file  Social History Narrative  . Not on file    Allergies  Allergen Reactions  . Ciprofloxacin Rash  . Daypro [Oxaprozin] Rash    Family History  Problem Relation Age of Onset  . Colon cancer Mother   . Heart attack Father        , massive  . Coronary artery disease Brother   . Thyroid cancer Brother     Prior to Admission medications   Medication Sig Start Date End Date Taking? Authorizing Provider  aspirin EC 81 MG tablet Take 81 mg by mouth at bedtime.    [provider]  atorvastatin (LIPITOR) 80 MG tablet Take 1 tablet (80 mg total) by mouth at bedtime. Please keep upcoming appt for future refills. Thank you 09/24/18   [name], MD  clopidogrel (PLAVIX) 75 MG tablet Take 1 tablet (75 mg total) by mouth at bedtime. Please keep upcoming appt for future refills. Thank you 09/24/18   [name], MD  fenofibrate 160 MG tablet Take 160 mg by mouth at bedtime.    [provider]  metoprolol succinate (TOPROL-XL) 25 MG 24 hr tablet TAKE 1 TABLET BY MOUTH  DAILY 09/18/18   [name], MD  niacin (NIASPAN) 750 MG CR tablet Take 1,500 mg by mouth at bedtime.    [provider]  nitroGLYCERIN (NITROSTAT) 0.4 MG SL tablet Place 0.4 mg under the tongue every 5 (five) minutes as needed. For chest pain    [provider]  pantoprazole (PROTONIX) 40 MG  tablet Take 40 mg by mouth at bedtime.    [provider]  traMADol (ULTRAM) 50 MG tablet Take 50 mg by mouth every 12 (twelve) hours as needed for moderate pain.  06/20/16   [provider]    Physical Exam:  Vitals:   11/03/18 1600 11/03/18 1602 11/03/18 1615 11/03/18 1630  BP: 108/86  114/73 126/81  Pulse:   (!) 123   Resp: (!) 31  (!) 28 (!) 30  Temp:      TempSrc:      SpO2:  95% 95%   Weight:      Height:         . General:  Appears ill - acutely vs. Chronically but in NAD . Eyes:  PERRL, EOMI, normal lids, iris . ENT:  grossly normal hearing, lips & tongue, mildly dry mm . Neck:  no LAD, masses or thyromegaly . Cardiovascular:  RR with mild tachycardia, no m/r/g. No LE edema.  Marland Kitchen Respiratory:   CTA bilaterally with no wheezes/rales/rhonchi.  Mildly increased respiratory effort. . Abdomen:  Diffusely tender to palpation . Skin:  no rash or induration seen on limited exam . Musculoskeletal:  grossly normal tone BUE/BLE, good ROM, no bony abnormality . Psychiatric:  flat mood and affect, speech sparse but appropriate, AOx3 . Neurologic:  CN 2-12 grossly intact, moves all extremities in coordinated fashion, sensation intact    Radiological Exams on Admission: Ct Abdomen Pelvis W Contrast  Result Date: 11/03/2018 CLINICAL DATA:  Worsening abdominal pain and tenderness. Worsening nausea and vomiting. Weight loss. Follow-up diverticulitis. EXAM: CT ABDOMEN AND PELVIS WITH CONTRAST TECHNIQUE: Multidetector CT imaging of the abdomen and pelvis was performed using the standard protocol following bolus administration of intravenous contrast. CONTRAST:  129mL OMNIPAQUE IOHEXOL 300 MG/ML  SOLN COMPARISON:  10/01/2018 FINDINGS: Lower Chest: No acute findings. Hepatobiliary: No hepatic masses identified. Gallbladder is unremarkable. No evidence of biliary ductal dilatation. Pancreas:  No mass or inflammatory changes. Spleen: Within normal limits in size and appearance.  Adrenals/Urinary Tract: No masses identified. No evidence of hydronephrosis. Stomach/Bowel: Mild diverticulitis again seen involving the distal sigmoid colon, without significant change. A tiny extraluminal gas and fluid collection is seen in the adjacent sigmoid mesentery measuring 1.6 cm on image 113/6. This is also stable and consistent with a micro perforation and small diverticular abscess. No evidence of bowel obstruction. Vascular/Lymphatic: No pathologically enlarged lymph nodes. Stable postop changes from previous aortobifemoral bypass grafting. Reproductive:  No mass or other significant abnormality. Other:  None. Musculoskeletal: No suspicious bone lesions identified. Prior lumbar spine fusion again noted. IMPRESSION: 1. No significant change in mild distal sigmoid diverticulitis, with micro-perforation and small diverticular abscess. 2. No other complication identified. Electronically Signed   By: Marlaine Hind M.D.   On: 11/03/2018 13:21    EKG: Independently reviewed.  Sinus tachcyardia with rate 110; nonspecific ST changes with no evidence of acute ischemia   Labs on Admission: I have personally reviewed the available labs and imaging studies at the time of the admission.  Pertinent labs:   K+ 3.1 Glucose 158 Anion gap 17 HS troponin 5 WBC 11.1 COVID negative   Assessment/Plan Principal Problem:   Diverticulitis of colon with perforation Active Problems:   Diabetes mellitus with peripheral artery disease (HCC)   Coronary artery disease involving native coronary artery of native heart without angina pectoris   Dyslipidemia   OSA (obstructive sleep apnea)   Diverticulitis with perf and abscess -Patient's symptoms are c/w diverticulitis and CT supports this as a diagnosis; he has severe sigmoid diverticulosis with wall thickening and adjacent inflammatory fat stranding with extraluminal air on repeated imaging on 6/9, 7/8, 8/10, and today -For now, will give bowel rest, IVF,  pain medication with morphine, nausea medication with Zofran,  and treat with IV antibiotics for resistant intraabdominal infection -Given recurrent symptoms and imaging findings despite repeated trials of antibiotics, he appears likely to need surgical repair for healing -Meanwhile, he is progressively failing to thrive with ongoing weight loss -Both GI (Dr. Therisa Doyne from [facility]) and surgery (Dr. Kieth Brightly) have been consulted.  Possible sepsis -SIRS criteria in this patient includes: Leukocytosis, tachycardia, tachypnea  -Lactate is pending; BP has been as low as 95/46 but is currently normal -While awaiting blood cultures, this appears to be a preseptic condition. -Sepsis protocol initiated -Suspected source is as above -Blood and urine cultures pending -Will admit due to: hemodynamic instability; failure of outpatient treatment -Treat with IV Cefepime/Flagyl for severe intraabdominal infection  H/o CAD -Hold ASA and Plavix for now for possible need for surgery  HTN -Resume Toprol XL tomorrow  DM -Diet controlled -Prior A1c 7.1 in 2018  HLD -Continue Lipitor -Hold Niaspan and Fenofibrate as these are unlikely to have significant utility during hospitalization  OSA -Does not wear CPAP    Note: This patient has been tested and is negative for the novel coronavirus COVID-19.   DVT prophylaxis:  SCDs Code Status:  Full - confirmed with patient/family Family Communication: Wife was present throughout evaluation  Disposition Plan:  Home once clinically improved Consults called: GI; surgery  Admission status: Admit - It is my clinical opinion that admission to INPATIENT is reasonable and necessary because of the expectation that this patient will require hospital care that crosses at least 2 midnights to treat this condition based on the medical complexity of the problems presented.  Given the aforementioned information, the predictability of an adverse outcome is felt to be  significant.   Karmen Bongo MD [facility]   How to contact the [facility] Attending or Consulting provider [place] or covering provider during after hours [place], for this patient?  1. Check the care team in [facility] and look for a) attending/consulting [facility] provider listed and b) the [facility] team listed 2. Log into [email or web address] and use [facility]'s universal password to access. If you do not have the password, please contact the hospital operator. 3. Locate the [facility] provider you are looking for under [facility] and page to a number that you can be directly reached. 4. If you still have difficulty reaching the provider, please page the [facility] (Director on Call) for the Hospitalists listed on amion for assistance.   11/03/2018, 5:01 PM

## 2018-11-03 NOTE — H&P (Signed)
Reason for Consult: Diverticulitis Referring Physician: Dr. Theotis Burrow MD  Ray Brown is an 68 y.o. male.  HPI: Ray Brown is a 68 yo male with a history of T2DM, PVD, CAD, and recurrent diverticulitis who presented to the ED today with his wife for emesis.  He began experiencing nausea and vomiting last night, and his symptoms have persisted throughout the day.  He denies hematemesis.  He has also been experiencing chills, fatigue, dizziness, and abdominal pain, but denies changes in his bowel movements.     He has had 2 previous episodes of diverticulitis within the past year, and finished antibiotics in August.  He has also experienced 30 pounds of unintentional weight loss with in the past 6 months.   ED workup significant for an elevated WBC of 11.1, potasium 3.1.  CT abdomen and pelvis demonstrates mild diverticulitis of sigmoid colon and an extraluminal gas and fluid collection in the adjacent sigmoid mesentery consistent with CT from last flare on 10/01/2018.   Consistent with diverticulitis of sigmoid colon with microperforation and diverticular abscess.     Past Medical History:  Diagnosis Date   Arthritis    Bronchitis    Chronic back pain    Complication of anesthesia    difficulty breathing s/p [facility]   Coronary artery disease    Diabetes mellitus without complication ([place])    Dry skin    in the back of head-left side   GERD (gastroesophageal reflux disease)    H/O hiatal hernia    Hypercholesteremia    Peripheral vascular disease (HCC)    Pneumonia    Shortness of breath    Sleep apnea    has CPAP but does not use    Past Surgical History:  Procedure Laterality Date   aortic bypass  2003   aortobifemoral bypass   BACK SURGERY  2004   CARDIAC CATHETERIZATION  2010   x 2 stents RCA   CARDIAC CATHETERIZATION N/A 12/08/2014   Procedure: Left Heart Cath and Coronary Angiography;  Surgeon: Lorretta Harp, MD;  Location:  [facility];  Service: Cardiovascular;  Laterality: N/A;   COLONOSCOPY  08/04/2009   CORONARY ANGIOPLASTY     2 stents   FOOT SURGERY  1980s   right foot   SHOULDER ARTHROSCOPY WITH SUBACROMIAL DECOMPRESSION Right 03/28/2013   Procedure: RIGHT SHOULDER ARTHROSCOPY WITH SUBACROMIAL DECOMPRESSION AND DISTAL CLAVICLE RESECTION;  Surgeon: Marin Shutter, MD;  Location: [facility];  Service: Orthopedics;  Laterality: Right;   SHOULDER SURGERY     left shoulder rotator cuff repair   UPPER GI ENDOSCOPY      Family History  Problem Relation Age of Onset   Colon cancer Mother    Heart attack Father        , massive   Coronary artery disease Brother    Thyroid cancer Brother     Social History:  reports that he has quit smoking. His smoking use included cigarettes. He quit after 35.00 years of use. He has never used smokeless tobacco. He reports that he does not drink alcohol or use drugs.  Allergies:  Allergies  Allergen Reactions   Ciprofloxacin Rash   Daypro [Oxaprozin] Rash    Medications: I have reviewed the patient's current medications.  Results for orders placed or performed during the hospital encounter of 11/03/18 (from the past 48 hour(s))  Comprehensive metabolic panel     Status: Abnormal   Collection Time: 11/03/18  6:48 AM  Result Value Ref Range   Sodium 141 135 - 145 mmol/L   Potassium 3.1 (L) 3.5 - 5.1 mmol/L   Chloride 101 98 - 111 mmol/L   CO2 23 22 - 32 mmol/L   Glucose, Bld 158 (H) 70 - 99 mg/dL   BUN 7 (L) 8 - 23 mg/dL   Creatinine, Ser 0.99 0.61 - 1.24 mg/dL   Calcium 9.9 8.9 - 10.3 mg/dL   Total Protein 7.0 6.5 - 8.1 g/dL   Albumin 3.9 3.5 - 5.0 g/dL   AST 21 15 - 41 U/L   ALT 15 0 - 44 U/L   Alkaline Phosphatase 84 38 - 126 U/L   Total Bilirubin 1.1 0.3 - 1.2 mg/dL   GFR calc non Af Amer >60 >60 mL/min   GFR calc Af Amer >60 >60 mL/min   Anion gap 17 (H) 5 - 15    Comment: Performed at [facility], [place] [address],  [place], [place] [address]  CBC     Status: Abnormal   Collection Time: 11/03/18  6:48 AM  Result Value Ref Range   WBC 11.1 (H) 4.0 - 10.5 K/uL   RBC 5.24 4.22 - 5.81 MIL/uL   Hemoglobin 16.7 13.0 - 17.0 g/dL   HCT 48.6 39.0 - 52.0 %   MCV 92.7 80.0 - 100.0 fL   MCH 31.9 26.0 - 34.0 pg   MCHC 34.4 30.0 - 36.0 g/dL   RDW 13.0 11.5 - 15.5 %   Platelets 293 150 - 400 K/uL   nRBC 0.0 0.0 - 0.2 %    Comment: Performed at [facility], [place] [address] [place], [place] [address]  Troponin I (High Sensitivity)     Status: None   Collection Time: 11/03/18 11:46 AM  Result Value Ref Range   Troponin I (High Sensitivity) 5 <18 ng/L    Comment: (NOTE) Elevated high sensitivity troponin I (hsTnI) values and significant  changes across serial measurements may suggest ACS but many other  chronic and acute conditions are known to elevate hsTnI results.  Refer to the "Links" section for chest pain algorithms and additional  guidance. Performed at [facility], [place] [address] [place], [place] [address]   SARS Coronavirus 2 [facility] order, Performed in [facility] hospital lab) Nasopharyngeal Nasopharyngeal Swab     Status: None   Collection Time: 11/03/18  3:36 PM   Specimen: Nasopharyngeal Swab  Result Value Ref Range   SARS Coronavirus 2 NEGATIVE NEGATIVE    Comment: (NOTE) If result is NEGATIVE SARS-CoV-2 target nucleic acids are NOT DETECTED. The SARS-CoV-2 RNA is generally detectable in upper and lower  respiratory specimens during the acute phase of infection. The lowest  concentration of SARS-CoV-2 viral copies this assay can detect is 250  copies / mL. A negative result does not preclude SARS-CoV-2 infection  and should not be used as the sole basis for treatment or other  patient management decisions.  A negative result may occur with  improper specimen collection / handling, submission of specimen other  than nasopharyngeal swab, presence of viral mutation(s) within the    areas targeted by this assay, and inadequate number of viral copies  (<250 copies / mL). A negative result must be combined with clinical  observations, patient history, and epidemiological information. If result is POSITIVE SARS-CoV-2 target nucleic acids are DETECTED. The SARS-CoV-2 RNA is generally detectable in upper and lower  respiratory specimens dur ing the  acute phase of infection.  Positive  results are indicative of active infection with SARS-CoV-2.  Clinical  correlation with patient history and other diagnostic information is  necessary to determine patient infection status.  Positive results do  not rule out bacterial infection or co-infection with other viruses. If result is PRESUMPTIVE POSTIVE SARS-CoV-2 nucleic acids MAY BE PRESENT.   A presumptive positive result was obtained on the submitted specimen  and confirmed on repeat testing.  While 2019 novel coronavirus  (SARS-CoV-2) nucleic acids may be present in the submitted sample  additional confirmatory testing may be necessary for epidemiological  and / or clinical management purposes  to differentiate between  SARS-CoV-2 and other Sarbecovirus currently known to infect humans.  If clinically indicated additional testing with an alternate test  methodology [phone]) is advised. The SARS-CoV-2 RNA is generally  detectable in upper and lower respiratory sp ecimens during the acute  phase of infection. The expected result is Negative. Fact Sheet for Patients:  StrictlyIdeas.no Fact Sheet for Healthcare Providers: BankingDealers.co.za This test is not yet approved or cleared by the [place] FDA and has been authorized for detection and/or diagnosis of SARS-CoV-2 by FDA under an Emergency Use Authorization (EUA).  This EUA will remain in effect (meaning this test can be used) for the duration of the COVID-19 declaration under Section 564(b)(1) of the Act, 21  U.S.C. section 360bbb-3(b)(1), unless the authorization is terminated or revoked sooner. Performed at [facility], [place] [address] [place], [place] [address]     Ct Abdomen Pelvis W Contrast  Result Date: 11/03/2018 CLINICAL DATA:  Worsening abdominal pain and tenderness. Worsening nausea and vomiting. Weight loss. Follow-up diverticulitis. EXAM: CT ABDOMEN AND PELVIS WITH CONTRAST TECHNIQUE: Multidetector CT imaging of the abdomen and pelvis was performed using the standard protocol following bolus administration of intravenous contrast. CONTRAST:  150mL OMNIPAQUE IOHEXOL 300 MG/ML  SOLN COMPARISON:  10/01/2018 FINDINGS: Lower Chest: No acute findings. Hepatobiliary: No hepatic masses identified. Gallbladder is unremarkable. No evidence of biliary ductal dilatation. Pancreas:  No mass or inflammatory changes. Spleen: Within normal limits in size and appearance. Adrenals/Urinary Tract: No masses identified. No evidence of hydronephrosis. Stomach/Bowel: Mild diverticulitis again seen involving the distal sigmoid colon, without significant change. A tiny extraluminal gas and fluid collection is seen in the adjacent sigmoid mesentery measuring 1.6 cm on image 113/6. This is also stable and consistent with a micro perforation and small diverticular abscess. No evidence of bowel obstruction. Vascular/Lymphatic: No pathologically enlarged lymph nodes. Stable postop changes from previous aortobifemoral bypass grafting. Reproductive:  No mass or other significant abnormality. Other:  None. Musculoskeletal: No suspicious bone lesions identified. Prior lumbar spine fusion again noted. IMPRESSION: 1. No significant change in mild distal sigmoid diverticulitis, with micro-perforation and small diverticular abscess. 2. No other complication identified. Electronically Signed   By: Marlaine Hind M.D.   On: 11/03/2018 13:21    Review of Systems  Constitutional: Positive for chills, malaise/fatigue and weight  loss.  Gastrointestinal: Positive for abdominal pain, nausea and vomiting. Negative for constipation.  Neurological: Positive for dizziness.  All other systems reviewed and are negative.  Blood pressure 126/81, pulse (!) 123, temperature 98.1 F (36.7 C), temperature source Oral, resp. rate (!) 30, height 5\' 8"  (1.727 m), weight 81.6 kg, SpO2 95 %. Physical Exam  Constitutional: He appears well-developed and well-nourished. No distress.  Cardiovascular: Normal rate, regular rhythm and normal heart sounds. Exam reveals no gallop and no friction rub.  No murmur  heard. Respiratory: Effort normal and breath sounds normal. No respiratory distress.  GI: Soft. He exhibits no distension. There is abdominal tenderness (RLQ). There is no rebound.  Skin: Skin is warm and dry. He is not diaphoretic.  Psychiatric: He has a normal mood and affect. His behavior is normal. Judgment and thought content normal.      Assessment/Plan: Ray Brown is a 68 yo male with emesis, RLQ abdominal pain, leukocytosis, and imaging consistent with recurrent diverticulitis with microperforation and abscess.    - Patient will remain NPO for bowel rest.   - Patient will continue antibiotic regimen of Metronidazole 500 mg IV Q8H.      Suzanna Obey 11/03/2018, 5:32 PM

## 2018-11-03 NOTE — ED Notes (Signed)
ED TO INPATIENT HANDOFF REPORT  ED Nurse Name and Phone #:  [employer] [employer] Name/Age/Gender Ray Brown 68 y.o. male Room/Bed: 012C/012C  Code Status   Code Status: Prior  Home/SNF/Other Home Patient oriented to: self, place, time and situation Is this baseline? Yes   Triage Complete: Triage complete  Chief Complaint emesis, diverticulosis  Triage Note The pt is c/o nausea and vomiting since 2000 no diarrhea  No pain  Hx diverticulosis    Allergies Allergies  Allergen Reactions  . Ciprofloxacin Rash  . Daypro [Oxaprozin] Rash    Level of Care/Admitting Diagnosis ED Disposition    ED Disposition Condition [facility] Area: [place] [[id number]]  Level of Care: Telemetry Medical [104]  Covid Evaluation: Asymptomatic Screening Protocol (No Symptoms)  Diagnosis: Diverticulitis of colon with perforation [id number]  Admitting Physician: Karmen Bongo [[id number]]  Attending Physician: Karmen Bongo [[id number]]  Estimated length of stay: 3 - 4 days  Certification:: I certify this patient will need inpatient services for at least 2 midnights  PT Class (Do Not Modify): Inpatient [101]  PT Acc Code (Do Not Modify): Private [1]       B Medical/Surgery History Past Medical History:  Diagnosis Date  . Arthritis   . Bronchitis   . Chronic back pain   . Complication of anesthesia    difficulty breathing s/p back [facility]  . Coronary artery disease   . Diabetes mellitus without complication ([place])   . Dry skin    in the back of head-left side  . GERD (gastroesophageal reflux disease)   . H/O hiatal hernia   . Hypercholesteremia   . Peripheral vascular disease ([place])   . Pneumonia   . Shortness of breath   . Sleep apnea    has CPAP but does not use   Past Surgical History:  Procedure Laterality Date  . aortic bypass  2003   aortobifemoral bypass  . BACK SURGERY  2004  . CARDIAC CATHETERIZATION  2010   x 2 stents RCA  . CARDIAC  CATHETERIZATION N/A 12/08/2014   Procedure: Left Heart Cath and Coronary Angiography;  Surgeon: Lorretta Harp, MD;  Location: [facility];  Service: Cardiovascular;  Laterality: N/A;  . COLONOSCOPY  08/04/2009  . CORONARY ANGIOPLASTY     2 stents  . FOOT SURGERY  1980s   right foot  . SHOULDER ARTHROSCOPY WITH SUBACROMIAL DECOMPRESSION Right 03/28/2013   Procedure: RIGHT SHOULDER ARTHROSCOPY WITH SUBACROMIAL DECOMPRESSION AND DISTAL CLAVICLE RESECTION;  Surgeon: Marin Shutter, MD;  Location: [facility];  Service: Orthopedics;  Laterality: Right;  . SHOULDER SURGERY     left shoulder rotator cuff repair  . UPPER GI ENDOSCOPY       A IV Location/Drains/Wounds Patient Lines/Drains/Airways Status   Active Line/Drains/Airways    Name:   Placement date:   Placement time:   Site:   Days:   Peripheral IV 11/03/18 Right Antecubital   11/03/18    0846    Antecubital   less than 1   Incision 11/18/11 Back   11/18/11    1100     2542   Incision 03/28/13 Shoulder Right   03/28/13    1107     2046          Intake/Output Last 24 hours  Intake/Output Summary (Last 24 hours) at 11/03/2018 1548 Last data filed at 11/03/2018 1059 Gross per 24 hour  Intake 1000 ml  Output -  Net 1000 ml    Labs/Imaging Results for orders placed or performed during the hospital encounter of 11/03/18 (from the past 48 hour(s))  Comprehensive metabolic panel     Status: Abnormal   Collection Time: 11/03/18  6:48 AM  Result Value Ref Range   Sodium 141 135 - 145 mmol/L   Potassium 3.1 (L) 3.5 - 5.1 mmol/L   Chloride 101 98 - 111 mmol/L   CO2 23 22 - 32 mmol/L   Glucose, Bld 158 (H) 70 - 99 mg/dL   BUN 7 (L) 8 - 23 mg/dL   Creatinine, Ser 0.99 0.61 - 1.24 mg/dL   Calcium 9.9 8.9 - 10.3 mg/dL   Total Protein 7.0 6.5 - 8.1 g/dL   Albumin 3.9 3.5 - 5.0 g/dL   AST 21 15 - 41 U/L   ALT 15 0 - 44 U/L   Alkaline Phosphatase 84 38 - 126 U/L   Total Bilirubin 1.1 0.3 - 1.2 mg/dL   GFR calc non Af Amer >60 >60  mL/min   GFR calc Af Amer >60 >60 mL/min   Anion gap 17 (H) 5 - 15    Comment: Performed at [facility], [address] [place], [place] [address]  CBC     Status: Abnormal   Collection Time: 11/03/18  6:48 AM  Result Value Ref Range   WBC 11.1 (H) 4.0 - 10.5 K/uL   RBC 5.24 4.22 - 5.81 MIL/uL   Hemoglobin 16.7 13.0 - 17.0 g/dL   HCT 48.6 39.0 - 52.0 %   MCV 92.7 80.0 - 100.0 fL   MCH 31.9 26.0 - 34.0 pg   MCHC 34.4 30.0 - 36.0 g/dL   RDW 13.0 11.5 - 15.5 %   Platelets 293 150 - 400 K/uL   nRBC 0.0 0.0 - 0.2 %    Comment: Performed at [facility], [place] [address] [place], [place] [address]  Troponin I (High Sensitivity)     Status: None   Collection Time: 11/03/18 11:46 AM  Result Value Ref Range   Troponin I (High Sensitivity) 5 <18 ng/L    Comment: (NOTE) Elevated high sensitivity troponin I (hsTnI) values and significant  changes across serial measurements may suggest ACS but many other  chronic and acute conditions are known to elevate hsTnI results.  Refer to the "Links" section for chest pain algorithms and additional  guidance. Performed at [facility], [place] [address] [place], [place] [address]    Ct Abdomen Pelvis W Contrast  Result Date: 11/03/2018 CLINICAL DATA:  Worsening abdominal pain and tenderness. Worsening nausea and vomiting. Weight loss. Follow-up diverticulitis. EXAM: CT ABDOMEN AND PELVIS WITH CONTRAST TECHNIQUE: Multidetector CT imaging of the abdomen and pelvis was performed using the standard protocol following bolus administration of intravenous contrast. CONTRAST:  123mL OMNIPAQUE IOHEXOL 300 MG/ML  SOLN COMPARISON:  10/01/2018 FINDINGS: Lower Chest: No acute findings. Hepatobiliary: No hepatic masses identified. Gallbladder is unremarkable. No evidence of biliary ductal dilatation. Pancreas:  No mass or inflammatory changes. Spleen: Within normal limits in size and appearance. Adrenals/Urinary Tract: No masses identified. No evidence  of hydronephrosis. Stomach/Bowel: Mild diverticulitis again seen involving the distal sigmoid colon, without significant change. A tiny extraluminal gas and fluid collection is seen in the adjacent sigmoid mesentery measuring 1.6 cm on image 113/6. This is also stable and consistent with a micro perforation and small diverticular abscess. No evidence of bowel obstruction. Vascular/Lymphatic: No pathologically enlarged lymph  nodes. Stable postop changes from previous aortobifemoral bypass grafting. Reproductive:  No mass or other significant abnormality. Other:  None. Musculoskeletal: No suspicious bone lesions identified. Prior lumbar spine fusion again noted. IMPRESSION: 1. No significant change in mild distal sigmoid diverticulitis, with micro-perforation and small diverticular abscess. 2. No other complication identified. Electronically Signed   By: Marlaine Hind M.D.   On: 11/03/2018 13:21    Pending Labs Unresulted Labs (From admission, onward)    Start     Ordered   11/03/18 1422  SARS Coronavirus 2 [facility] order, Performed in [facility] hospital lab) Nasopharyngeal Nasopharyngeal Swab  (Symptomatic/High Risk of Exposure/Tier 1 Patients Labs with Precautions)  Once,   STAT    Question Answer Comment  Is this test for diagnosis or screening Screening   Symptomatic for COVID-19 as defined by CDC No   Hospitalized for COVID-19 No   Admitted to ICU for COVID-19 No   Previously tested for COVID-19 No   Resident in a congregate (group) care setting No   Employed in healthcare setting No      11/03/18 1421   11/03/18 0643  Urinalysis, Routine w reflex microscopic  ONCE - STAT,   STAT     11/03/18 0642          Vitals/Pain Today's Vitals   11/03/18 1200 11/03/18 1230 11/03/18 1400 11/03/18 1430  BP: 122/77 110/74 (!) 95/46 115/82  Pulse: 98  96 (!) 116  Resp: (!) 27 (!) 29 (!) 26 (!) 30  Temp:      TempSrc:      SpO2: 95%  94% (!) 89%  Weight:      Height:      PainSc:         Isolation Precautions No active isolations  Medications Medications  piperacillin-tazobactam (ZOSYN) IVPB 3.375 g (3.375 g Intravenous New Bag/Given 11/03/18 1534)  ondansetron (ZOFRAN-ODT) disintegrating tablet 4 mg (4 mg Oral Given 11/03/18 0647)  ondansetron (ZOFRAN) injection 4 mg (4 mg Intravenous Given 11/03/18 0922)  sodium chloride 0.9 % bolus 1,000 mL (0 mLs Intravenous Stopped 11/03/18 1059)  iohexol (OMNIPAQUE) 300 MG/ML solution 100 mL (100 mLs Intravenous Contrast Given 11/03/18 1250)  metoCLOPramide (REGLAN) injection 10 mg (10 mg Intravenous Given 11/03/18 1059)  LORazepam (ATIVAN) injection 1 mg (1 mg Intravenous Given 11/03/18 1153)    Mobility walks     Focused Assessments Cardiac Assessment Handoff:  Cardiac Rhythm: Sinus tachycardia Lab Results  Component Value Date   CKTOTAL 171 11/01/2010   CKMB 3.1 11/01/2010   TROPONINI <0.03 12/06/2014   Lab Results  Component Value Date   DDIMER 0.51 (H) 10/31/2010   Does the Patient currently have chest pain? No     R Recommendations: See Admitting Provider Note  Report given to:   Additional Notes:

## 2018-11-03 NOTE — ED Notes (Signed)
Spoke with [facility] and they will not accept report on patient, related to elevated MEWS score.

## 2018-11-03 NOTE — Consult Note (Signed)
[facility] Consult  Referring Provider: Karmen Bongo, MD Primary Care Physician:  [name], MD Primary Gastroenterologist: [facility]  Reason for Consultation: Recurrent diverticulitis, with microperforation and abscess, failed outpatient antibiotic therapy  HPI: Ray Brown is a 68 y.o. male presented to the ER with severe nausea, lack of appetite, 20 to 30 pound weight loss since June 2020.  Patient has been treated for complicated diverticulitis with microperforation and abscess with oral antibiotics(Augmentin and Flagyl) since June 2020 and had a CT scan as an outpatient on 10/15/2018 which again showed microperforation.  As per wife he has had intermittent symptoms, however last night he had several episodes of vomiting and presented to the ED.  His last colonoscopy was in 2015, had tubular adenoma removed and was due for a colonoscopy this year. Patient states he has regular bowel movements and denies noticing blood in stool or black stools.    Past Medical History:  Diagnosis Date  . Arthritis   . Bronchitis   . Chronic back pain   . Complication of anesthesia    difficulty breathing s/p back surgery at [facility] 2013  . Coronary artery disease   . Diabetes mellitus without complication ([place])   . Dry skin    in the back of head-left side  . GERD (gastroesophageal reflux disease)   . H/O hiatal hernia   . Hypercholesteremia   . Peripheral vascular disease ([place])   . Pneumonia   . Shortness of breath   . Sleep apnea    has CPAP but does not use    Past Surgical History:  Procedure Laterality Date  . aortic bypass  2003   aortobifemoral bypass  . BACK SURGERY  2004  . CARDIAC CATHETERIZATION  2010   x 2 stents RCA  . CARDIAC CATHETERIZATION N/A 12/08/2014   Procedure: Left Heart Cath and Coronary Angiography;  Surgeon: Lorretta Harp, MD;  Location: [facility];  Service: Cardiovascular;  Laterality: N/A;  . COLONOSCOPY  08/04/2009  .  CORONARY ANGIOPLASTY     2 stents  . FOOT SURGERY  1980s   right foot  . SHOULDER ARTHROSCOPY WITH SUBACROMIAL DECOMPRESSION Right 03/28/2013   Procedure: RIGHT SHOULDER ARTHROSCOPY WITH SUBACROMIAL DECOMPRESSION AND DISTAL CLAVICLE RESECTION;  Surgeon: Marin Shutter, MD;  Location: [place];  Service: Orthopedics;  Laterality: Right;  . SHOULDER SURGERY     left shoulder rotator cuff repair  . UPPER GI ENDOSCOPY      Prior to Admission medications   Medication Sig Start Date End Date Taking? Authorizing Provider  aspirin EC 81 MG tablet Take 81 mg by mouth at bedtime.   Yes [provider]  atorvastatin (LIPITOR) 80 MG tablet Take 1 tablet (80 mg total) by mouth at bedtime. Please keep upcoming appt for future refills. Thank you 09/24/18  Yes [name], MD  clopidogrel (PLAVIX) 75 MG tablet Take 1 tablet (75 mg total) by mouth at bedtime. Please keep upcoming appt for future refills. Thank you 09/24/18  Yes [name], MD  fenofibrate 160 MG tablet Take 160 mg by mouth at bedtime.   Yes [provider]  metoprolol succinate (TOPROL-XL) 25 MG 24 hr tablet TAKE 1 TABLET BY MOUTH  DAILY 09/18/18  Yes [name], MD  niacin (NIASPAN) 750 MG CR tablet Take 1,500 mg by mouth at bedtime.   Yes [provider]  nitroGLYCERIN (NITROSTAT) 0.4 MG SL tablet Place 0.4 mg under the tongue every 5 (  five) minutes as needed. For chest pain   Yes [provider]  pantoprazole (PROTONIX) 40 MG tablet Take 40 mg by mouth at bedtime.   Yes [provider]  traMADol (ULTRAM) 50 MG tablet Take 50 mg by mouth every 12 (twelve) hours as needed for moderate pain.  06/20/16  Yes [provider]    Current Facility-Administered Medications  Medication Dose Route Frequency Provider Last Rate Last Dose  . 0.9 %  sodium chloride infusion   Intravenous Continuous Karmen Bongo, MD 100 mL/hr at 11/03/18 1634    . acetaminophen (TYLENOL) tablet 650 mg  650 mg Oral  Q6H PRN Karmen Bongo, MD       Or  . acetaminophen (TYLENOL) suppository 650 mg  650 mg Rectal Q6H PRN Karmen Bongo, MD      . atorvastatin (LIPITOR) tablet 80 mg  80 mg Oral Ivery Quale, MD      . Derrill Memo ON 11/04/2018] ceFEPIme (MAXIPIME) 2 g in sodium chloride 0.9 % 100 mL IVPB  2 g Intravenous Lynne Logan, MD      . metoprolol succinate (TOPROL-XL) 24 hr tablet 25 mg  25 mg Oral Daily Karmen Bongo, MD      . metroNIDAZOLE (FLAGYL) IVPB 500 mg  500 mg Intravenous Lynne Logan, MD 100 mL/hr at 11/03/18 1741 500 mg at 11/03/18 1741  . morphine 2 MG/ML injection 2 mg  2 mg Intravenous Q2H PRN Karmen Bongo, MD      . ondansetron [facility]) tablet 4 mg  4 mg Oral Q6H PRN Karmen Bongo, MD       Or  . ondansetron [facility]) injection 4 mg  4 mg Intravenous Q6H PRN Karmen Bongo, MD   4 mg at 11/03/18 1741  . sodium chloride flush (NS) 0.9 % injection 3 mL  3 mL Intravenous Q12H Karmen Bongo, MD       Current Outpatient Medications  Medication Sig Dispense Refill  . aspirin EC 81 MG tablet Take 81 mg by mouth at bedtime.    Marland Kitchen atorvastatin (LIPITOR) 80 MG tablet Take 1 tablet (80 mg total) by mouth at bedtime. Please keep upcoming appt for future refills. Thank you 90 tablet 0  . clopidogrel (PLAVIX) 75 MG tablet Take 1 tablet (75 mg total) by mouth at bedtime. Please keep upcoming appt for future refills. Thank you 90 tablet 0  . fenofibrate 160 MG tablet Take 160 mg by mouth at bedtime.    . metoprolol succinate (TOPROL-XL) 25 MG 24 hr tablet TAKE 1 TABLET BY MOUTH  DAILY 90 tablet 0  . niacin (NIASPAN) 750 MG CR tablet Take 1,500 mg by mouth at bedtime.    . nitroGLYCERIN (NITROSTAT) 0.4 MG SL tablet Place 0.4 mg under the tongue every 5 (five) minutes as needed. For chest pain    . pantoprazole (PROTONIX) 40 MG tablet Take 40 mg by mouth at bedtime.    . traMADol (ULTRAM) 50 MG tablet Take 50 mg by mouth every 12 (twelve) hours as needed for moderate pain.        Allergies as of 11/03/2018 - Review Complete 11/03/2018  Allergen Reaction Noted  . Ciprofloxacin Rash 11/03/2011  . Daypro [oxaprozin] Rash 11/04/2011    Family History  Problem Relation Age of Onset  . Colon cancer Mother   . Heart attack Father        , massive  . Coronary artery disease Brother   . Thyroid cancer Brother  Social History   Socioeconomic History  . Marital status: Married    Spouse name: Not on file  . Number of children: Not on file  . Years of education: Not on file  . Highest education level: Not on file  Occupational History  . Occupation: retired  Scientific laboratory technician  . Financial resource strain: Not on file  . Food insecurity    Worry: Not on file    Inability: Not on file  . Transportation needs    Medical: Not on file    Non-medical: Not on file  Tobacco Use  . Smoking status: Former Smoker    Years: 35.00    Types: Cigarettes  . Smokeless tobacco: Never Used  Substance and Sexual Activity  . Alcohol use: No  . Drug use: No  . Sexual activity: Not on file  Lifestyle  . Physical activity    Days per week: Not on file    Minutes per session: Not on file  . Stress: Not on file  Relationships  . Social Herbalist on phone: Not on file    Gets together: Not on file    Attends religious service: Not on file    Active member of club or organization: Not on file    Attends meetings of clubs or organizations: Not on file    Relationship status: Not on file  . Intimate partner violence    Fear of current or ex partner: Not on file    Emotionally abused: Not on file    Physically abused: Not on file    Forced sexual activity: Not on file  Other Topics Concern  . Not on file  Social History Narrative  . Not on file    Review of Systems: Positive for: GI: Described in detail in HPI.    Gen: anorexia, fatigue, weakness, malaise, involuntary weight loss,Denies any fever, chills, rigors, night sweats,  and sleep disorder CV:  Denies chest pain, angina, palpitations, syncope, orthopnea, PND, peripheral edema, and claudication. Resp: Denies dyspnea, cough, sputum, wheezing, coughing up blood. GU : Denies urinary burning, blood in urine, urinary frequency, urinary hesitancy, nocturnal urination, and urinary incontinence. MS: Denies joint pain or swelling.  Denies muscle weakness, cramps, atrophy.  Derm: Denies rash, itching, oral ulcerations, hives, unhealing ulcers.  Psych: Denies depression, anxiety, memory loss, suicidal ideation, hallucinations,  and confusion. Heme: Denies bruising, bleeding, and enlarged lymph nodes. Neuro:  Denies any headaches, dizziness, paresthesias. Endo:  DM, Denies any problems with thyroid, adrenal function.  Physical Exam: Vital signs in last 24 hours: Temp:  [98.1 F (36.7 C)] 98.1 F (36.7 C) (09/12 0627) Pulse Rate:  [84-123] 123 (09/12 1615) Resp:  [18-31] 30 (09/12 1630) BP: (95-147)/(46-90) 126/81 (09/12 1630) SpO2:  [94 %-97 %] 95 % (09/12 1615) Weight:  [81.6 kg] 81.6 kg (09/12 0652)    General:   Ill-appearing, appears dehydrated, lethargic Head:  Normocephalic and atraumatic. Eyes:  Sclera clear, no icterus.   Conjunctiva pink. Ears:  Normal auditory acuity. Nose:  No deformity, discharge,  or lesions. Mouth:  No deformity or lesions.  Oropharynx -dry. Neck:  Supple; no masses or thyromegaly. Lungs:  Clear throughout to auscultation.   No wheezes, crackles, or rhonchi. No acute distress. Heart:  Regular rate and rhythm; no murmurs, clicks, rubs,  or gallops. Extremities:  Without clubbing or edema. Neurologic:  Alert and  oriented x4;  grossly normal neurologically. Skin:  Intact without significant lesions or rashes. Psych:  Alert  and cooperative. Normal mood and affect. Abdomen:  Soft, nontender and nondistended. No masses, hepatosplenomegaly or hernias noted. Normal bowel sounds, without guarding, and without rebound.         Lab Results: Recent Labs     11/03/18 0648  WBC 11.1*  HGB 16.7  HCT 48.6  PLT 293   BMET Recent Labs    11/03/18 0648  NA 141  K 3.1*  CL 101  CO2 23  GLUCOSE 158*  BUN 7*  CREATININE 0.99  CALCIUM 9.9   LFT Recent Labs    11/03/18 0648  PROT 7.0  ALBUMIN 3.9  AST 21  ALT 15  ALKPHOS 84  BILITOT 1.1   PT/INR No results for input(s): LABPROT, INR in the last 72 hours.  Studies/Results: Ct Abdomen Pelvis W Contrast  Result Date: 11/03/2018 CLINICAL DATA:  Worsening abdominal pain and tenderness. Worsening nausea and vomiting. Weight loss. Follow-up diverticulitis. EXAM: CT ABDOMEN AND PELVIS WITH CONTRAST TECHNIQUE: Multidetector CT imaging of the abdomen and pelvis was performed using the standard protocol following bolus administration of intravenous contrast. CONTRAST:  161mL OMNIPAQUE IOHEXOL 300 MG/ML  SOLN COMPARISON:  10/01/2018 FINDINGS: Lower Chest: No acute findings. Hepatobiliary: No hepatic masses identified. Gallbladder is unremarkable. No evidence of biliary ductal dilatation. Pancreas:  No mass or inflammatory changes. Spleen: Within normal limits in size and appearance. Adrenals/Urinary Tract: No masses identified. No evidence of hydronephrosis. Stomach/Bowel: Mild diverticulitis again seen involving the distal sigmoid colon, without significant change. A tiny extraluminal gas and fluid collection is seen in the adjacent sigmoid mesentery measuring 1.6 cm on image 113/6. This is also stable and consistent with a micro perforation and small diverticular abscess. No evidence of bowel obstruction. Vascular/Lymphatic: No pathologically enlarged lymph nodes. Stable postop changes from previous aortobifemoral bypass grafting. Reproductive:  No mass or other significant abnormality. Other:  None. Musculoskeletal: No suspicious bone lesions identified. Prior lumbar spine fusion again noted. IMPRESSION: 1. No significant change in mild distal sigmoid diverticulitis, with micro-perforation and small  diverticular abscess. 2. No other complication identified. Electronically Signed   By: Marlaine Hind M.D.   On: 11/03/2018 13:21    Impression: Distal sigmoid diverticulitis with microperforation and small diverticular abscess Patient has had at least 2 rounds of oral antibiotics(Augmentin and Flagyl) since June 2020 Baseline hemoglobin between 14-15, 16.7 today likely secondary to dehydration Mild leukocytosis of 11.1  Plan: Patient currently n.p.o. and has been started on IV cefepime and IV Flagyl(has received 1 dose of IV Zosyn) Patient has received Reglan 10 mg IV 1 dose, after receiving 1 unit normal saline bolus, he is continued on normal saline at 100 cc an hour. Patient has failed oral antibiotic as an outpatient and has had diverticulitis with microperforation and abscess since June 2020. I believe patient will benefit from sigmoidectomy, surgical evaluation has been requested. I discussed the same with the patient and his wife at bedside.   LOS: 0 days   [name] MD  11/03/2018, 5:43 PM  Pager [phone] If no answer or after 5 PM call [phone]

## 2018-11-04 ENCOUNTER — Inpatient Hospital Stay (HOSPITAL_COMMUNITY): Payer: 59

## 2018-11-04 DIAGNOSIS — E876 Hypokalemia: Secondary | ICD-10-CM

## 2018-11-04 DIAGNOSIS — A419 Sepsis, unspecified organism: Principal | ICD-10-CM

## 2018-11-04 DIAGNOSIS — N179 Acute kidney failure, unspecified: Secondary | ICD-10-CM

## 2018-11-04 DIAGNOSIS — R6520 Severe sepsis without septic shock: Secondary | ICD-10-CM

## 2018-11-04 DIAGNOSIS — K5720 Diverticulitis of large intestine with perforation and abscess without bleeding: Secondary | ICD-10-CM

## 2018-11-04 LAB — CBC
HCT: 48 % (ref 39.0–52.0)
Hemoglobin: 16.9 g/dL (ref 13.0–17.0)
MCH: 32.1 pg (ref 26.0–34.0)
MCHC: 35.2 g/dL (ref 30.0–36.0)
MCV: 91.3 fL (ref 80.0–100.0)
Platelets: 295 10*3/uL (ref 150–400)
RBC: 5.26 MIL/uL (ref 4.22–5.81)
RDW: 13 % (ref 11.5–15.5)
WBC: 21.8 10*3/uL — ABNORMAL HIGH (ref 4.0–10.5)
nRBC: 0 % (ref 0.0–0.2)

## 2018-11-04 LAB — BASIC METABOLIC PANEL
Anion gap: 20 — ABNORMAL HIGH (ref 5–15)
BUN: 21 mg/dL (ref 8–23)
CO2: 23 mmol/L (ref 22–32)
Calcium: 9.4 mg/dL (ref 8.9–10.3)
Chloride: 100 mmol/L (ref 98–111)
Creatinine, Ser: 1.85 mg/dL — ABNORMAL HIGH (ref 0.61–1.24)
GFR calc Af Amer: 42 mL/min — ABNORMAL LOW (ref >60)
GFR calc non Af Amer: 36 mL/min — ABNORMAL LOW (ref >60)
Glucose, Bld: 149 mg/dL — ABNORMAL HIGH (ref 70–99)
Potassium: 3.2 mmol/L — ABNORMAL LOW (ref 3.5–5.1)
Sodium: 143 mmol/L (ref 135–145)

## 2018-11-04 LAB — RENAL FUNCTION PANEL
Albumin: 3.4 g/dL — ABNORMAL LOW (ref 3.5–5.0)
Anion gap: 16 — ABNORMAL HIGH (ref 5–15)
BUN: 24 mg/dL — ABNORMAL HIGH (ref 8–23)
CO2: 26 mmol/L (ref 22–32)
Calcium: 9 mg/dL (ref 8.9–10.3)
Chloride: 102 mmol/L (ref 98–111)
Creatinine, Ser: 1.46 mg/dL — ABNORMAL HIGH (ref 0.61–1.24)
GFR calc Af Amer: 56 mL/min — ABNORMAL LOW (ref >60)
GFR calc non Af Amer: 48 mL/min — ABNORMAL LOW (ref >60)
Glucose, Bld: 138 mg/dL — ABNORMAL HIGH (ref 70–99)
Phosphorus: 3.2 mg/dL (ref 2.5–4.6)
Potassium: 3.3 mmol/L — ABNORMAL LOW (ref 3.5–5.1)
Sodium: 144 mmol/L (ref 135–145)

## 2018-11-04 LAB — URINALYSIS, ROUTINE W REFLEX MICROSCOPIC
Bacteria, UA: NONE SEEN
Bilirubin Urine: NEGATIVE
Cellular Cast, UA: 3
Glucose, UA: 50 mg/dL — AB
Hgb urine dipstick: NEGATIVE
Ketones, ur: 5 mg/dL — AB
Leukocytes,Ua: NEGATIVE
Nitrite: NEGATIVE
Protein, ur: 100 mg/dL — AB
Specific Gravity, Urine: >1.046 — ABNORMAL HIGH (ref 1.005–1.030)
pH: 5 (ref 5.0–8.0)

## 2018-11-04 LAB — SODIUM, URINE, RANDOM: Sodium, Ur: <10 mmol/L

## 2018-11-04 MED ORDER — SODIUM CHLORIDE 0.9 % IV SOLN
1.0000 g | INTRAVENOUS | Status: DC
  Administered 2018-11-04 – 2018-11-10 (×7): 1000 mg via INTRAVENOUS
  Filled 2018-11-04 (×7): qty 1

## 2018-11-04 MED ORDER — LACTATED RINGERS IV BOLUS
1000.0000 mL | Freq: Once | INTRAVENOUS | Status: AC
  Administered 2018-11-04: 10:00:00 1000 mL via INTRAVENOUS

## 2018-11-04 MED ORDER — METOPROLOL TARTRATE 5 MG/5ML IV SOLN
5.0000 mg | INTRAVENOUS | Status: DC | PRN
  Administered 2018-11-04: 5 mg via INTRAVENOUS
  Filled 2018-11-04: qty 5

## 2018-11-04 MED ORDER — POTASSIUM CHLORIDE 10 MEQ/100ML IV SOLN
10.0000 meq | INTRAVENOUS | Status: AC
  Administered 2018-11-04: 10 meq via INTRAVENOUS
  Filled 2018-11-04: qty 100

## 2018-11-04 NOTE — Progress Notes (Signed)
Subjective/Chief Complaint: Nausea continues, no abd pain present   Objective: Vital signs in last 24 hours: Temp:  [97.7 F (36.5 C)-98.5 F (36.9 C)] 98.5 F (36.9 C) (09/13 0503) Pulse Rate:  [64-123] 90 (09/13 0503) Resp:  [18-31] 29 (09/13 0503) BP: (95-140)/(46-91) 107/69 (09/13 0503) SpO2:  [93 %-97 %] 93 % (09/13 0503) Weight:  [77.9 kg] 77.9 kg (09/13 0503) Last BM Date: 11/02/18  Intake/Output from previous day: 09/12 0701 - 09/13 0700 In: 2055.9 [IV Piggyback:2055.9] Out: -  Intake/Output this shift: No intake/output data recorded.  cv tachy rr Pulm clear Abd soft nontender few bs present   Lab Results:  Recent Labs    11/03/18 0648 11/04/18 0355  WBC 11.1* 21.8*  HGB 16.7 16.9  HCT 48.6 48.0  PLT 293 295   BMET Recent Labs    11/03/18 0648 11/04/18 0355  NA 141 143  K 3.1* 3.2*  CL 101 100  CO2 23 23  GLUCOSE 158* 149*  BUN 7* 21  CREATININE 0.99 1.85*  CALCIUM 9.9 9.4   PT/INR No results for input(s): LABPROT, INR in the last 72 hours. ABG No results for input(s): PHART, HCO3 in the last 72 hours.  Invalid input(s): PCO2, PO2  Studies/Results: Ct Abdomen Pelvis W Contrast  Result Date: 11/03/2018 CLINICAL DATA:  Worsening abdominal pain and tenderness. Worsening nausea and vomiting. Weight loss. Follow-up diverticulitis. EXAM: CT ABDOMEN AND PELVIS WITH CONTRAST TECHNIQUE: Multidetector CT imaging of the abdomen and pelvis was performed using the standard protocol following bolus administration of intravenous contrast. CONTRAST:  165mL OMNIPAQUE IOHEXOL 300 MG/ML  SOLN COMPARISON:  10/01/2018 FINDINGS: Lower Chest: No acute findings. Hepatobiliary: No hepatic masses identified. Gallbladder is unremarkable. No evidence of biliary ductal dilatation. Pancreas:  No mass or inflammatory changes. Spleen: Within normal limits in size and appearance. Adrenals/Urinary Tract: No masses identified. No evidence of hydronephrosis. Stomach/Bowel:  Mild diverticulitis again seen involving the distal sigmoid colon, without significant change. A tiny extraluminal gas and fluid collection is seen in the adjacent sigmoid mesentery measuring 1.6 cm on image 113/6. This is also stable and consistent with a micro perforation and small diverticular abscess. No evidence of bowel obstruction. Vascular/Lymphatic: No pathologically enlarged lymph nodes. Stable postop changes from previous aortobifemoral bypass grafting. Reproductive:  No mass or other significant abnormality. Other:  None. Musculoskeletal: No suspicious bone lesions identified. Prior lumbar spine fusion again noted. IMPRESSION: 1. No significant change in mild distal sigmoid diverticulitis, with micro-perforation and small diverticular abscess. 2. No other complication identified. Electronically Signed   By: Marlaine Hind M.D.   On: 11/03/2018 13:21    Anti-infectives: Anti-infectives (From admission, onward)   Start     Dose/Rate Route Frequency Ordered Stop   11/04/18 0845  ertapenem (INVANZ) 1,000 mg in sodium chloride 0.9 % 100 mL IVPB     1 g 200 mL/hr over 30 Minutes Intravenous Every 24 hours 11/04/18 0834     11/04/18 0100  ceFEPIme (MAXIPIME) 2 g in sodium chloride 0.9 % 100 mL IVPB     2 g 200 mL/hr over 30 Minutes Intravenous Every 8 hours 11/03/18 1658     11/03/18 1700  metroNIDAZOLE (FLAGYL) IVPB 500 mg     500 mg 100 mL/hr over 60 Minutes Intravenous Every 8 hours 11/03/18 1623     11/03/18 1630  ceFEPIme (MAXIPIME) 2 g in sodium chloride 0.9 % 100 mL IVPB     2 g 200 mL/hr over 30 Minutes Intravenous  Once 11/03/18 1623 11/03/18 1706   11/03/18 1430  piperacillin-tazobactam (ZOSYN) IVPB 3.375 g     3.375 g 100 mL/hr over 30 Minutes Intravenous  Once 11/03/18 1421 11/03/18 1632      Assessment/Plan: Acute on chronic diverticulitis -complicated disease recs with wbc of 21 would start invanz I have written for this -npo, likely needs meds converted to iv -check xray  this am, may need ng tube present -will check ntn parameters -he is likely going to need sigmoidectomy with likely ostomy in near future FEN-would recommend starting tpn now due to history and likely npo for some time VTE-he can start lovenox today from my standpoint and would recommend that, scds ID;[email or web address] day 1   Rolm Bookbinder 11/04/2018

## 2018-11-04 NOTE — Progress Notes (Addendum)
Subjective: The patient was seen and examined at bedside. He continues to have several episodes of emesis. He denies abdominal pain, has not had a bowel movement in 2 days.  Objective: Vital signs in last 24 hours: Temp:  [97.7 F (36.5 C)-98.5 F (36.9 C)] 98.5 F (36.9 C) (09/13 0503) Pulse Rate:  [64-123] 92 (09/13 0805) Resp:  [20-31] 26 (09/13 0805) BP: (95-140)/(46-108) 125/108 (09/13 0805) SpO2:  [93 %-97 %] 94 % (09/13 0805) Weight:  [77.9 kg] 77.9 kg (09/13 0503) Weight change: -3.748 kg Last BM Date: 11/03/18  PE: In distress, appears sick and dehydrated GENERAL: Has an emesis bag at bedside ABDOMEN: Soft, nontender EXTREMITIES: No deformity  Lab Results: Results for orders placed or performed during the hospital encounter of 11/03/18 (from the past 48 hour(s))  Comprehensive metabolic panel     Status: Abnormal   Collection Time: 11/03/18  6:48 AM  Result Value Ref Range   Sodium 141 135 - 145 mmol/L   Potassium 3.1 (L) 3.5 - 5.1 mmol/L   Chloride 101 98 - 111 mmol/L   CO2 23 22 - 32 mmol/L   Glucose, Bld 158 (H) 70 - 99 mg/dL   BUN 7 (L) 8 - 23 mg/dL   Creatinine, Ser 0.99 0.61 - 1.24 mg/dL   Calcium 9.9 8.9 - 10.3 mg/dL   Total Protein 7.0 6.5 - 8.1 g/dL   Albumin 3.9 3.5 - 5.0 g/dL   AST 21 15 - 41 U/L   ALT 15 0 - 44 U/L   Alkaline Phosphatase 84 38 - 126 U/L   Total Bilirubin 1.1 0.3 - 1.2 mg/dL   GFR calc non Af Amer >60 >60 mL/min   GFR calc Af Amer >60 >60 mL/min   Anion gap 17 (H) 5 - 15    Comment: Performed at [facility], [address], [place], [place] [address]  CBC     Status: Abnormal   Collection Time: 11/03/18  6:48 AM  Result Value Ref Range   WBC 11.1 (H) 4.0 - 10.5 K/uL   RBC 5.24 4.22 - 5.81 MIL/uL   Hemoglobin 16.7 13.0 - 17.0 g/dL   HCT 48.6 39.0 - 52.0 %   MCV 92.7 80.0 - 100.0 fL   MCH 31.9 26.0 - 34.0 pg   MCHC 34.4 30.0 - 36.0 g/dL   RDW 13.0 11.5 - 15.5 %   Platelets 293 150 - 400 K/uL   nRBC 0.0 0.0 - 0.2 %     Comment: Performed at [facility], [place] [address], [place], [place] [address]  Troponin I (High Sensitivity)     Status: None   Collection Time: 11/03/18 11:46 AM  Result Value Ref Range   Troponin I (High Sensitivity) 5 <18 ng/L    Comment: (NOTE) Elevated high sensitivity troponin I (hsTnI) values and significant  changes across serial measurements may suggest ACS but many other  chronic and acute conditions are known to elevate hsTnI results.  Refer to the "Links" section for chest pain algorithms and additional  guidance. Performed at [facility], [place] [address] [place], [place] [address]   SARS Coronavirus 2 [facility] order, Performed in [facility] hospital lab) Nasopharyngeal Nasopharyngeal Swab     Status: None   Collection Time: 11/03/18  3:36 PM   Specimen: Nasopharyngeal Swab  Result Value Ref Range   SARS Coronavirus 2 NEGATIVE NEGATIVE    Comment: (NOTE) If result is NEGATIVE SARS-CoV-2 target nucleic acids  are NOT DETECTED. The SARS-CoV-2 RNA is generally detectable in upper and lower  respiratory specimens during the acute phase of infection. The lowest  concentration of SARS-CoV-2 viral copies this assay can detect is 250  copies / mL. A negative result does not preclude SARS-CoV-2 infection  and should not be used as the sole basis for treatment or other  patient management decisions.  A negative result may occur with  improper specimen collection / handling, submission of specimen other  than nasopharyngeal swab, presence of viral mutation(s) within the  areas targeted by this assay, and inadequate number of viral copies  (<250 copies / mL). A negative result must be combined with clinical  observations, patient history, and epidemiological information. If result is POSITIVE SARS-CoV-2 target nucleic acids are DETECTED. The SARS-CoV-2 RNA is generally detectable in upper and lower  respiratory specimens dur ing the acute phase of infection.   Positive  results are indicative of active infection with SARS-CoV-2.  Clinical  correlation with patient history and other diagnostic information is  necessary to determine patient infection status.  Positive results do  not rule out bacterial infection or co-infection with other viruses. If result is PRESUMPTIVE POSTIVE SARS-CoV-2 nucleic acids MAY BE PRESENT.   A presumptive positive result was obtained on the submitted specimen  and confirmed on repeat testing.  While 2019 novel coronavirus  (SARS-CoV-2) nucleic acids may be present in the submitted sample  additional confirmatory testing may be necessary for epidemiological  and / or clinical management purposes  to differentiate between  SARS-CoV-2 and other Sarbecovirus currently known to infect humans.  If clinically indicated additional testing with an alternate test  methodology [phone]) is advised. The SARS-CoV-2 RNA is generally  detectable in upper and lower respiratory sp ecimens during the acute  phase of infection. The expected result is Negative. Fact Sheet for Patients:  StrictlyIdeas.no Fact Sheet for Healthcare Providers: BankingDealers.co.za This test is not yet approved or cleared by the [place] FDA and has been authorized for detection and/or diagnosis of SARS-CoV-2 by FDA under an Emergency Use Authorization (EUA).  This EUA will remain in effect (meaning this test can be used) for the duration of the COVID-19 declaration under Section 564(b)(1) of the Act, 21 U.S.C. section 360bbb-3(b)(1), unless the authorization is terminated or revoked sooner. Performed at [facility], [place] [address] [place], [place] [address]   Lactic acid, plasma     Status: Abnormal   Collection Time: 11/03/18  5:47 PM  Result Value Ref Range   Lactic Acid, Venous 2.4 (HH) 0.5 - 1.9 mmol/L    Comment: CRITICAL RESULT CALLED TO, READ BACK BY AND VERIFIED WITH: Jeraldine Loots RN AT [place] 11/03/2018 BY [facility] Performed at [facility], [place] [address], [place],  [address]   Procalcitonin     Status: None   Collection Time: 11/03/18  5:47 PM  Result Value Ref Range   Procalcitonin <0.10 ng/mL    Comment:        Interpretation: PCT (Procalcitonin) <= 0.5 ng/mL: Systemic infection (sepsis) is not likely. Local bacterial infection is possible. (NOTE)       Sepsis PCT Algorithm           Lower Respiratory Tract                                      Infection PCT Algorithm    ----------------------------     ----------------------------  PCT < 0.25 ng/mL                PCT < 0.10 ng/mL         Strongly encourage             Strongly discourage   discontinuation of antibiotics    initiation of antibiotics    ----------------------------     -----------------------------       PCT 0.25 - 0.50 ng/mL            PCT 0.10 - 0.25 ng/mL               OR       >80% decrease in PCT            Discourage initiation of                                            antibiotics      Encourage discontinuation           of antibiotics    ----------------------------     -----------------------------         PCT >= 0.50 ng/mL              PCT 0.26 - 0.50 ng/mL               AND        <80% decrease in PCT             Encourage initiation of                                             antibiotics       Encourage continuation           of antibiotics    ----------------------------     -----------------------------        PCT >= 0.50 ng/mL                  PCT > 0.50 ng/mL               AND         increase in PCT                  Strongly encourage                                      initiation of antibiotics    Strongly encourage escalation           of antibiotics                                     -----------------------------                                           PCT <= 0.25 ng/mL                                                   OR                                         > 80% decrease in PCT                                     Discontinue / Do not initiate                                             antibiotics Performed at [facility], [place] [address] [place], [place] [address]   Culture, blood (routine x 2)     Status: None (Preliminary result)   Collection Time: 11/03/18  5:58 PM   Specimen: BLOOD  Result Value Ref Range   Specimen Description BLOOD LEFT ANTECUBITAL    Special Requests      BOTTLES DRAWN AEROBIC AND ANAEROBIC Blood Culture adequate volume   Culture      NO GROWTH < 12 HOURS Performed at [facility], [place] [address], [place], [place] [address]    Report Status PENDING   Culture, blood (routine x 2)     Status: None (Preliminary result)   Collection Time: 11/03/18  6:40 PM   Specimen: BLOOD LEFT HAND  Result Value Ref Range   Specimen Description BLOOD LEFT HAND    Special Requests      BOTTLES DRAWN AEROBIC AND ANAEROBIC Blood Culture results may not be optimal due to an inadequate volume of blood received in culture bottles   Culture      NO GROWTH < 12 HOURS Performed at [place] [address] [place], [place] [address]    Report Status PENDING   Lactic acid, plasma     Status: None   Collection Time: 11/03/18  9:16 PM  Result Value Ref Range   Lactic Acid, Venous 1.9 0.5 - 1.9 mmol/L    Comment: Performed at [place] [address] [place], [place] [id number]  Basic metabolic panel     Status: Abnormal   Collection Time: 11/04/18  3:55 AM  Result Value Ref Range   Sodium 143 135 - 145 mmol/L   Potassium 3.2 (L) 3.5 - 5.1 mmol/L   Chloride 100 98 - 111 mmol/L   CO2 23 22 - 32 mmol/L   Glucose, Bld 149 (H) 70 - 99 mg/dL   BUN 21 8 - 23 mg/dL   Creatinine, Ser 1.85 (H) 0.61 - 1.24 mg/dL   Calcium 9.4 8.9 - 10.3 mg/dL   GFR calc non Af Amer 36 (L) >60 mL/min   GFR calc Af Amer 42 (L) >60 mL/min   Anion gap 20 (H) 5 - 15    Comment: Performed at  [facility], [place] [address], [place] [address]  CBC     Status: Abnormal   Collection Time: 11/04/18  3:55 AM  Result Value Ref Range   WBC 21.8 (H) 4.0 - 10.5 K/uL   RBC 5.26 4.22 - 5.81 MIL/uL   Hemoglobin 16.9 13.0 - 17.0 g/dL   HCT 48.0 39.0 - 52.0 %   MCV 91.3 80.0 - 100.0 fL   MCH 32.1 26.0 -  34.0 pg   MCHC 35.2 30.0 - 36.0 g/dL   RDW 13.0 11.5 - 15.5 %   Platelets 295 150 - 400 K/uL   nRBC 0.0 0.0 - 0.2 %    Comment: Performed at [facility], [place] [address], [place], [place] [address]    Studies/Results: Dg Abd 1 View  Result Date: 11/04/2018 CLINICAL DATA:  Possible colonic diverticulitis. EXAM: ABDOMEN - 1 VIEW COMPARISON:  CT 11/03/2018 FINDINGS: Bowel gas pattern is nonobstructive. No free peritoneal air. Contrast present over the bladder from recent CT scan. Right iliac stent. Degenerative changes spine with fusion hardware at the L2-3 level. IMPRESSION: Nonobstructive bowel gas pattern. Electronically Signed   By: Marin Olp M.D.   On: 11/04/2018 09:22   Ct Abdomen Pelvis W Contrast  Result Date: 11/03/2018 CLINICAL DATA:  Worsening abdominal pain and tenderness. Worsening nausea and vomiting. Weight loss. Follow-up diverticulitis. EXAM: CT ABDOMEN AND PELVIS WITH CONTRAST TECHNIQUE: Multidetector CT imaging of the abdomen and pelvis was performed using the standard protocol following bolus administration of intravenous contrast. CONTRAST:  13mL OMNIPAQUE IOHEXOL 300 MG/ML  SOLN COMPARISON:  10/01/2018 FINDINGS: Lower Chest: No acute findings. Hepatobiliary: No hepatic masses identified. Gallbladder is unremarkable. No evidence of biliary ductal dilatation. Pancreas:  No mass or inflammatory changes. Spleen: Within normal limits in size and appearance. Adrenals/Urinary Tract: No masses identified. No evidence of hydronephrosis. Stomach/Bowel: Mild diverticulitis again seen involving the distal sigmoid colon, without significant change. A tiny  extraluminal gas and fluid collection is seen in the adjacent sigmoid mesentery measuring 1.6 cm on image 113/6. This is also stable and consistent with a micro perforation and small diverticular abscess. No evidence of bowel obstruction. Vascular/Lymphatic: No pathologically enlarged lymph nodes. Stable postop changes from previous aortobifemoral bypass grafting. Reproductive:  No mass or other significant abnormality. Other:  None. Musculoskeletal: No suspicious bone lesions identified. Prior lumbar spine fusion again noted. IMPRESSION: 1. No significant change in mild distal sigmoid diverticulitis, with micro-perforation and small diverticular abscess. 2. No other complication identified. Electronically Signed   By: Marlaine Hind M.D.   On: 11/03/2018 13:21    Medications: I have reviewed the patient's current medications.  Assessment: Sigmoid diverticulitis with microperforation and diverticular abscess Worsening leukocytosis(11.1-21.8 today), continues to have several episodes of emesis-antibiotic was changed to IV ertapenem today morning at 9 AM Worsening renal function, elevation in BUN and creatinine(21/1.85) and worsening renal function GFR only 36 today. Lactic acidosis Abdominal x-ray showed nonobstructive bowel gas pattern   Plan: Patient likely heading towards sigmoidectomy with ostomy, surgical team has evaluated the patient today morning. Patient has failed oral antibiotic treatment(since June 2020), and clinically appears to be worsening with worsening leukocytosis. Will order for 1 litre of IV lactated ringers and increase IVF to 125 ml /hr.   [name] 11/04/2018, 9:29 AM   Pager [phone] If no answer or after 5 PM call [phone]

## 2018-11-04 NOTE — Progress Notes (Signed)
I text paged Dr. Marthenia Rolling to make him aware of low grade temp at 99.7 and the fact that the patient still has not voided my shift. I did a bladder scan and nothing showed on scan. Awaiting return of call.

## 2018-11-04 NOTE — Progress Notes (Signed)
PROGRESS NOTE    Ray Brown  [id number] DOB: 1950-08-16 DOA: 11/03/2018 PCP: [name], MD  Outpatient Specialists:   Brief Narrative:  Ray Brown is a 68 y.o. male with medical history significant of OSA not on CPAP; PVD; HLD; DM; CAD; diverticulosis; and chronic back pain.  Patient presented with nausea and vomiting.  Found revealed severe diverticulitis with microperforation and small diverticular abscess.  Reported 30 pound weight loss.  Apparently, patient has had recurrent diverticulitis, followed by [facility] since June, several rounds of abx, most recently in early August.  11/04/2018: Patient seen alongside patient's wife and nurse.  Patient continues to report nausea, with vague fever and chills.  Left lower abdominal/inguinal tenderness is mild.  No leukocytosis is noted.  Acute kidney injury is noted.  Antibiotics has been changed to IV ertapenem.  Input from GI and surgery is appreciated.  Assessment & Plan:   Principal Problem:   Diverticulitis of colon with perforation Active Problems:   Diabetes mellitus with peripheral artery disease (HCC)   Coronary artery disease involving native coronary artery of native heart without angina pectoris   Dyslipidemia   OSA (obstructive sleep apnea)  Diverticulitis with perf and abscess -Patient's symptoms are c/w diverticulitis and CT supports this as a diagnosis; he has severe sigmoid diverticulosis with wall thickening and adjacent inflammatory fat stranding with extraluminal air on repeated imaging on 6/9, 7/8, 8/10, and today -For now, will give bowel rest, IVF, pain medication with morphine, nausea medication with Zofran, and treat with IV antibiotics for resistant intraabdominal infection -Given recurrent symptoms and imaging findings despite repeated trials of antibiotics, he appears likely to need surgical repair for healing -Meanwhile, he is progressively failing to thrive with ongoing weight loss -Both GI (Dr.  Therisa Doyne from [facility]) and surgery (Dr. Kieth Brightly) have been consulted.  Possible sepsis -SIRS criteria in this patient includes: Leukocytosis, tachycardia, tachypnea  -Lactate is pending; BP has been as low as 95/46 but is currently normal -While awaiting blood cultures, this appears to be a preseptic condition. -Sepsis protocol initiated -Suspected source is as above -Blood and urine cultures pending -Will admit due to: hemodynamic instability; failure of outpatient treatment -Treat with IV Cefepime/Flagyl for severe intraabdominal infection 11/04/2018: Antibiotics has been changed to IV ertapenem.  GI and surgery team are directing diverticuli abscess management. -Worsening leukocytosis is noted.  Acute kidney injury: -Serum creatinine has risen to 1.85. -This could be all prerenal.  However, patient is at risk for ATN considering low blood pressure on presentation. -Check urine sodium -Urinalysis -Strict I's and O's -Renal ultrasound -Check C3/C4 and ANA -Further management will depend on hospital course  H/o CAD -Hold ASA and Plavix for now for possible need for surgery  HTN -On IV Lopressor as needed.  DM -Diet controlled -Prior A1c 7.1 in 2018  HLD -Continue Lipitor -Hold Niaspan and Fenofibrate as these are unlikely to have significant utility during hospitalization  OSA -Does not wear CPAP  Note: This patient has been tested and is negative for the novel coronavirus COVID-19.  DVT prophylaxis:SCDs Code Status:Full - confirmed with patient/family Family Communication:Wife was present throughout evaluation Disposition Plan:Home once clinically improved Consults called:GI; surgery  Procedures:   None  Antimicrobials:   None on IV ertapenem   Subjective: Nausea and vomiting Vague fever and chills.    Objective: Vitals:   11/03/18 2300 11/04/18 0100 11/04/18 0503 11/04/18 0805  BP: 127/73 (!) 105/91 107/69 (!) 125/108  Pulse: (!) 123  (!) 112 90  92  Resp: (!) 28 (!) 25 (!) 29 (!) 26  Temp: 98.3 F (36.8 C)  98.5 F (36.9 C)   TempSrc: Oral  Oral   SpO2: 97% 94% 93% 94%  Weight:   77.9 kg   Height:        Intake/Output Summary (Last 24 hours) at 11/04/2018 1047 Last data filed at 11/03/2018 2000 Gross per 24 hour  Intake 2055.9 ml  Output --  Net 2055.9 ml   Filed Weights   11/03/18 0627 11/03/18 0652 11/04/18 0503  Weight: 81.6 kg 81.6 kg 77.9 kg    Examination:  General exam: Acutely ill looking.  Respiratory system: Clear to auscultation.  Cardiovascular system: S1 & S2 heard. Gastrointestinal system: Abdomen is nondistended, soft with mild left lower abdomen/inguinal tenderness.  Organs are difficult to assess. Central nervous system: Alert and oriented.  Patient moves all extremities. Extremities: No leg edema.   Data Reviewed: I have personally reviewed following labs and imaging studies  CBC: Recent Labs  Lab 11/03/18 0648 11/04/18 0355  WBC 11.1* 21.8*  HGB 16.7 16.9  HCT 48.6 48.0  MCV 92.7 91.3  PLT 293 [id number]   Basic Metabolic Panel: Recent Labs  Lab 11/03/18 0648 11/04/18 0355  NA 141 143  K 3.1* 3.2*  CL 101 100  CO2 23 23  GLUCOSE 158* 149*  BUN 7* 21  CREATININE 0.99 1.85*  CALCIUM 9.9 9.4   GFR: Estimated Creatinine Clearance: 36.5 mL/min (A) (by C-G formula based on SCr of 1.85 mg/dL (H)). Liver Function Tests: Recent Labs  Lab 11/03/18 0648  AST 21  ALT 15  ALKPHOS 84  BILITOT 1.1  PROT 7.0  ALBUMIN 3.9   No results for input(s): LIPASE, AMYLASE in the last 168 hours. No results for input(s): AMMONIA in the last 168 hours. Coagulation Profile: No results for input(s): INR, PROTIME in the last 168 hours. Cardiac Enzymes: No results for input(s): CKTOTAL, CKMB, CKMBINDEX, TROPONINI in the last 168 hours. BNP (last 3 results) No results for input(s): PROBNP in the last 8760 hours. HbA1C: No results for input(s): HGBA1C in the last 72 hours. CBG: No  results for input(s): GLUCAP in the last 168 hours. Lipid Profile: No results for input(s): CHOL, HDL, LDLCALC, TRIG, CHOLHDL, LDLDIRECT in the last 72 hours. Thyroid Function Tests: No results for input(s): TSH, T4TOTAL, FREET4, T3FREE, THYROIDAB in the last 72 hours. Anemia Panel: No results for input(s): VITAMINB12, FOLATE, FERRITIN, TIBC, IRON, RETICCTPCT in the last 72 hours. Urine analysis:    Component Value Date/Time   COLORURINE STRAW (A) 10/04/2017 2022   APPEARANCEUR CLEAR 10/04/2017 2022   LABSPEC 1.008 10/04/2017 2022   PHURINE 8.0 10/04/2017 2022   GLUCOSEU NEGATIVE 10/04/2017 2022   HGBUR NEGATIVE 10/04/2017 2022   BILIRUBINUR NEGATIVE 10/04/2017 2022   KETONESUR NEGATIVE 10/04/2017 2022   PROTEINUR NEGATIVE 10/04/2017 2022   UROBILINOGEN 0.2 01/22/2010 1943   NITRITE NEGATIVE 10/04/2017 2022   LEUKOCYTESUR NEGATIVE 10/04/2017 2022   Sepsis Labs: @LABRCNTIP (procalcitonin:4,lacticidven:4)  ) Recent Results (from the past 240 hour(s))  SARS Coronavirus 2 [facility] order, Performed in [facility] hospital lab) Nasopharyngeal Nasopharyngeal Swab     Status: None   Collection Time: 11/03/18  3:36 PM   Specimen: Nasopharyngeal Swab  Result Value Ref Range Status   SARS Coronavirus 2 NEGATIVE NEGATIVE Final    Comment: (NOTE) If result is NEGATIVE SARS-CoV-2 target nucleic acids are NOT DETECTED. The SARS-CoV-2 RNA is generally detectable in upper and lower  respiratory  specimens during the acute phase of infection. The lowest  concentration of SARS-CoV-2 viral copies this assay can detect is 250  copies / mL. A negative result does not preclude SARS-CoV-2 infection  and should not be used as the sole basis for treatment or other  patient management decisions.  A negative result may occur with  improper specimen collection / handling, submission of specimen other  than nasopharyngeal swab, presence of viral mutation(s) within the  areas targeted by this assay, and  inadequate number of viral copies  (<250 copies / mL). A negative result must be combined with clinical  observations, patient history, and epidemiological information. If result is POSITIVE SARS-CoV-2 target nucleic acids are DETECTED. The SARS-CoV-2 RNA is generally detectable in upper and lower  respiratory specimens dur ing the acute phase of infection.  Positive  results are indicative of active infection with SARS-CoV-2.  Clinical  correlation with patient history and other diagnostic information is  necessary to determine patient infection status.  Positive results do  not rule out bacterial infection or co-infection with other viruses. If result is PRESUMPTIVE POSTIVE SARS-CoV-2 nucleic acids MAY BE PRESENT.   A presumptive positive result was obtained on the submitted specimen  and confirmed on repeat testing.  While 2019 novel coronavirus  (SARS-CoV-2) nucleic acids may be present in the submitted sample  additional confirmatory testing may be necessary for epidemiological  and / or clinical management purposes  to differentiate between  SARS-CoV-2 and other Sarbecovirus currently known to infect humans.  If clinically indicated additional testing with an alternate test  methodology [phone]) is advised. The SARS-CoV-2 RNA is generally  detectable in upper and lower respiratory sp ecimens during the acute  phase of infection. The expected result is Negative. Fact Sheet for Patients:  StrictlyIdeas.no Fact Sheet for Healthcare Providers: BankingDealers.co.za This test is not yet approved or cleared by the [place] FDA and has been authorized for detection and/or diagnosis of SARS-CoV-2 by FDA under an Emergency Use Authorization (EUA).  This EUA will remain in effect (meaning this test can be used) for the duration of the COVID-19 declaration under Section 564(b)(1) of the Act, 21 U.S.C. section 360bbb-3(b)(1), unless the  authorization is terminated or revoked sooner. Performed at [facility], [place] [address], [place], [place] [address]   Culture, blood (routine x 2)     Status: None (Preliminary result)   Collection Time: 11/03/18  5:58 PM   Specimen: BLOOD  Result Value Ref Range Status   Specimen Description BLOOD LEFT ANTECUBITAL  Final   Special Requests   Final    BOTTLES DRAWN AEROBIC AND ANAEROBIC Blood Culture adequate volume   Culture   Final    NO GROWTH < 12 HOURS Performed at [facility], [place] [address] [place], [place] [address]    Report Status PENDING  Incomplete  Culture, blood (routine x 2)     Status: None (Preliminary result)   Collection Time: 11/03/18  6:40 PM   Specimen: BLOOD LEFT HAND  Result Value Ref Range Status   Specimen Description BLOOD LEFT HAND  Final   Special Requests   Final    BOTTLES DRAWN AEROBIC AND ANAEROBIC Blood Culture results may not be optimal due to an inadequate volume of blood received in culture bottles   Culture   Final    NO GROWTH < 12 HOURS Performed at [facility], [place] [address], [place], [place] [address]    Report  Status PENDING  Incomplete         Radiology Studies: Dg Abd 1 View  Result Date: 11/04/2018 CLINICAL DATA:  Possible colonic diverticulitis. EXAM: ABDOMEN - 1 VIEW COMPARISON:  CT 11/03/2018 FINDINGS: Bowel gas pattern is nonobstructive. No free peritoneal air. Contrast present over the bladder from recent CT scan. Right iliac stent. Degenerative changes spine with fusion hardware at the L2-3 level. IMPRESSION: Nonobstructive bowel gas pattern. Electronically Signed   By: Marin Olp M.D.   On: 11/04/2018 09:22   Ct Abdomen Pelvis W Contrast  Result Date: 11/03/2018 CLINICAL DATA:  Worsening abdominal pain and tenderness. Worsening nausea and vomiting. Weight loss. Follow-up diverticulitis. EXAM: CT ABDOMEN AND PELVIS WITH CONTRAST TECHNIQUE: Multidetector CT imaging of the abdomen and pelvis was  performed using the standard protocol following bolus administration of intravenous contrast. CONTRAST:  130mL OMNIPAQUE IOHEXOL 300 MG/ML  SOLN COMPARISON:  10/01/2018 FINDINGS: Lower Chest: No acute findings. Hepatobiliary: No hepatic masses identified. Gallbladder is unremarkable. No evidence of biliary ductal dilatation. Pancreas:  No mass or inflammatory changes. Spleen: Within normal limits in size and appearance. Adrenals/Urinary Tract: No masses identified. No evidence of hydronephrosis. Stomach/Bowel: Mild diverticulitis again seen involving the distal sigmoid colon, without significant change. A tiny extraluminal gas and fluid collection is seen in the adjacent sigmoid mesentery measuring 1.6 cm on image 113/6. This is also stable and consistent with a micro perforation and small diverticular abscess. No evidence of bowel obstruction. Vascular/Lymphatic: No pathologically enlarged lymph nodes. Stable postop changes from previous aortobifemoral bypass grafting. Reproductive:  No mass or other significant abnormality. Other:  None. Musculoskeletal: No suspicious bone lesions identified. Prior lumbar spine fusion again noted. IMPRESSION: 1. No significant change in mild distal sigmoid diverticulitis, with micro-perforation and small diverticular abscess. 2. No other complication identified. Electronically Signed   By: Marlaine Hind M.D.   On: 11/03/2018 13:21        Scheduled Meds:  atorvastatin  80 mg Oral QHS   metoprolol succinate  25 mg Oral Daily   sodium chloride flush  3 mL Intravenous Q12H   Continuous Infusions:  0.9 % NaCl with KCl 40 mEq / L 125 mL/hr (11/04/18 1015)   ertapenem 1,000 mg (11/04/18 1019)   potassium chloride       LOS: 1 day    Time spent: [place]    Dana Allan, MD  [facility] Pager #: [phone] 7PM-7AM contact night coverage as above

## 2018-11-04 NOTE — Progress Notes (Signed)
I spoke with GI MD who wanted met to clarify with Surgeon in regards to need for NG tube. Dr. Donne Hazel stated that from the appearance of his abd xray and the fact that he seems to just be vomiting what he is putting in his mouth, he wanted him to be no ice chips or anything to see if that helps. Patient and GI MD informed and plan to hold NG Tube unless vomiting persists.

## 2018-11-05 ENCOUNTER — Inpatient Hospital Stay: Payer: Self-pay

## 2018-11-05 ENCOUNTER — Inpatient Hospital Stay (HOSPITAL_COMMUNITY): Payer: 59

## 2018-11-05 LAB — HIV ANTIBODY (ROUTINE TESTING W REFLEX): HIV Screen 4th Generation wRfx: NONREACTIVE

## 2018-11-05 LAB — CBC
HCT: 41.4 % (ref 39.0–52.0)
Hemoglobin: 13.7 g/dL (ref 13.0–17.0)
MCH: 31.9 pg (ref 26.0–34.0)
MCHC: 33.1 g/dL (ref 30.0–36.0)
MCV: 96.3 fL (ref 80.0–100.0)
Platelets: 230 10*3/uL (ref 150–400)
RBC: 4.3 MIL/uL (ref 4.22–5.81)
RDW: 13.2 % (ref 11.5–15.5)
WBC: 12.9 10*3/uL — ABNORMAL HIGH (ref 4.0–10.5)
nRBC: 0 % (ref 0.0–0.2)

## 2018-11-05 LAB — RENAL FUNCTION PANEL
Albumin: 2.8 g/dL — ABNORMAL LOW (ref 3.5–5.0)
Albumin: 2.8 g/dL — ABNORMAL LOW (ref 3.5–5.0)
Anion gap: 10 (ref 5–15)
Anion gap: 9 (ref 5–15)
BUN: 27 mg/dL — ABNORMAL HIGH (ref 8–23)
BUN: 23 mg/dL (ref 8–23)
CO2: 25 mmol/L (ref 22–32)
CO2: 25 mmol/L (ref 22–32)
Calcium: 8 mg/dL — ABNORMAL LOW (ref 8.9–10.3)
Calcium: 8 mg/dL — ABNORMAL LOW (ref 8.9–10.3)
Chloride: 110 mmol/L (ref 98–111)
Chloride: 108 mmol/L (ref 98–111)
Creatinine, Ser: 0.92 mg/dL (ref 0.61–1.24)
Creatinine, Ser: 0.7 mg/dL (ref 0.61–1.24)
GFR calc Af Amer: >60 mL/min (ref >60)
GFR calc Af Amer: >60 mL/min (ref >60)
GFR calc non Af Amer: >60 mL/min (ref >60)
GFR calc non Af Amer: >60 mL/min (ref >60)
Glucose, Bld: 98 mg/dL (ref 70–99)
Glucose, Bld: 86 mg/dL (ref 70–99)
Phosphorus: 2.3 mg/dL — ABNORMAL LOW (ref 2.5–4.6)
Phosphorus: 2.9 mg/dL (ref 2.5–4.6)
Potassium: 4.3 mmol/L (ref 3.5–5.1)
Potassium: 4.5 mmol/L (ref 3.5–5.1)
Sodium: 145 mmol/L (ref 135–145)
Sodium: 142 mmol/L (ref 135–145)

## 2018-11-05 LAB — MAGNESIUM
Magnesium: 0.5 mg/dL — CL (ref 1.7–2.4)
Magnesium: 1.4 mg/dL — ABNORMAL LOW (ref 1.7–2.4)

## 2018-11-05 LAB — URINE CULTURE: Culture: NO GROWTH

## 2018-11-05 LAB — ANTINUCLEAR ANTIBODIES, IFA: ANA Ab, IFA: NEGATIVE

## 2018-11-05 LAB — PREALBUMIN: Prealbumin: 15.2 mg/dL — ABNORMAL LOW (ref 18–38)

## 2018-11-05 MED ORDER — MAGNESIUM SULFATE 2 GM/50ML IV SOLN
2.0000 g | Freq: Once | INTRAVENOUS | Status: AC
  Administered 2018-11-05: 2 g via INTRAVENOUS
  Filled 2018-11-05: qty 50

## 2018-11-05 MED ORDER — ASPIRIN EC 81 MG PO TBEC
81.0000 mg | DELAYED_RELEASE_TABLET | Freq: Every day | ORAL | Status: DC
  Administered 2018-11-05 – 2018-11-10 (×6): 81 mg via ORAL
  Filled 2018-11-05 (×6): qty 1

## 2018-11-05 MED ORDER — SODIUM CHLORIDE 0.9% FLUSH
10.0000 mL | INTRAVENOUS | Status: DC | PRN
  Administered 2018-11-08: 10 mL
  Filled 2018-11-05: qty 40

## 2018-11-05 MED ORDER — SODIUM CHLORIDE 0.9% FLUSH
10.0000 mL | Freq: Two times a day (BID) | INTRAVENOUS | Status: DC
  Administered 2018-11-06: 20 mL
  Administered 2018-11-06: 10 mL
  Administered 2018-11-07: 30 mL
  Administered 2018-11-07 – 2018-11-09 (×3): 10 mL

## 2018-11-05 MED ORDER — POTASSIUM PHOSPHATES 15 MMOLE/5ML IV SOLN
20.0000 mmol | Freq: Once | INTRAVENOUS | Status: AC
  Administered 2018-11-05: 20 mmol via INTRAVENOUS
  Filled 2018-11-05: qty 6.67

## 2018-11-05 MED ORDER — MAGNESIUM SULFATE 4 GM/100ML IV SOLN
4.0000 g | Freq: Once | INTRAVENOUS | Status: AC
  Administered 2018-11-05: 4 g via INTRAVENOUS
  Filled 2018-11-05: qty 100

## 2018-11-05 MED ORDER — CHLORHEXIDINE GLUCONATE CLOTH 2 % EX PADS
6.0000 | MEDICATED_PAD | Freq: Every day | CUTANEOUS | Status: DC
  Administered 2018-11-06 – 2018-11-10 (×5): 6 via TOPICAL

## 2018-11-05 MED ORDER — METOPROLOL SUCCINATE ER 25 MG PO TB24
25.0000 mg | ORAL_TABLET | Freq: Every day | ORAL | Status: DC
  Administered 2018-11-05 – 2018-11-10 (×6): 25 mg via ORAL
  Filled 2018-11-05 (×6): qty 1

## 2018-11-05 MED ORDER — SODIUM CHLORIDE 0.9 % IV SOLN
INTRAVENOUS | Status: AC
  Administered 2018-11-05 – 2018-11-07 (×3): via INTRAVENOUS

## 2018-11-05 NOTE — Progress Notes (Addendum)
Radiologist, [place] Crane, paged NP with MRI without contrast brain results. MRI was normal except for smooth dural thickening along convexities. Differential includes, but no likely, SDH. Radiologist recommended MRI brain with contrast. No emergency. MRI with contrast ordered. Original MRI ordered because of vertigo. Follow results.  KJKG, NP [facility] Update: MRI brain with contrast confirms the dural thickening, but no SDH.  KJKG, NP [facility]

## 2018-11-05 NOTE — Progress Notes (Signed)
PROGRESS NOTE    Ray Brown  [id number] DOB: [birth year] DOA: 11/03/2018 PCP: [name], MD  Outpatient Specialists:   Brief Narrative:  Ray Brown is a 68 y.o. male with medical history significant of OSA not on CPAP; PVD; HLD; DM; CAD; diverticulosis; and chronic back pain.  Patient presented with nausea and vomiting.  Found revealed severe diverticulitis with microperforation and small diverticular abscess.  Reported 30 pound weight loss.  Apparently, patient has had recurrent diverticulitis, followed by [facility] since June, several rounds of abx, most recently in early August.  11/04/2018: Patient seen alongside patient's wife and nurse.  Patient continues to report nausea, with vague fever and chills.  Left lower abdominal/inguinal tenderness is mild.  No leukocytosis is noted.  Acute kidney injury is noted.  Antibiotics has been changed to IV ertapenem.  Input from GI and surgery is appreciated.  11/05/2018: Patient looks a lot better today.  Acute kidney injury has resolved.  Leukocytosis has improved significantly on IV Invanz.  Input from the GI and surgical team is highly appreciated.  Surgical team has broached surgical options with the patient.  Patient's magnesium was 0.5.  Will replete.  Patient reports significant vertigo.  Will hold Antivert until magnesium is replete.  Possibly MRI of the brain.  Further management depend on hospital course.  Assessment & Plan:   Principal Problem:   Diverticulitis of colon with perforation Active Problems:   Diabetes mellitus with peripheral artery disease (HCC)   Coronary artery disease involving native coronary artery of native heart without angina pectoris   Dyslipidemia   OSA (obstructive sleep apnea)  Diverticulitis with perf and abscess -Patient's symptoms are c/w diverticulitis and CT supports this as a diagnosis; he has severe sigmoid diverticulosis with wall thickening and adjacent inflammatory fat stranding with  extraluminal air on repeated imaging on 6/9, 7/8, 8/10, and today -For now, will give bowel rest, IVF, pain medication with morphine, nausea medication with Zofran, and treat with IV antibiotics for resistant intraabdominal infection -Given recurrent symptoms and imaging findings despite repeated trials of antibiotics, he appears likely to need surgical repair for healing -Meanwhile, he is progressively failing to thrive with ongoing weight loss -Both GI (Dr. Therisa Doyne from [facility]) and surgery (Dr. Kieth Brightly) have been consulted. 11/05/2018:  Looks better today.  Continue IV Invanz for now.  Surgery and GI team following patient.  Possible sepsis -SIRS criteria in this patient includes: Leukocytosis, tachycardia, tachypnea  -Lactate is pending; BP has been as low as 95/46 but is currently normal -While awaiting blood cultures, this appears to be a preseptic condition. -Sepsis protocol initiated -Suspected source is as above -Blood and urine cultures pending -Will admit due to: hemodynamic instability; failure of outpatient treatment -Treat with IV Cefepime/Flagyl for severe intraabdominal infection 11/04/2018: Antibiotics has been changed to IV ertapenem.  GI and surgery team are directing diverticuli abscess management. -Worsening leukocytosis is noted. 11/05/2018: Leukocytosis has improved significantly after 1 day of IV Invanz.  Acute kidney injury: -Serum creatinine has risen to 1.85. -This could be all prerenal.  However, patient is at risk for ATN considering low blood pressure on presentation. -Check urine sodium -Urinalysis -Strict I's and O's -Renal ultrasound -Check C3/C4 and ANA -Further management will depend on hospital course 11/05/2018: Acute kidney injury has resolved.  AKI is likely prerenal.  Severe hypomagnesemia: IV magnesium sulfate 4 g x 1 dose. Repeat magnesium level.  Patient has had diarrhea and poor p.o. intake. Doubt this reflects refeeding syndrome.  Mild  hypophosphatemia: Replete with IV potassium phosphate x1 dose. Repeat level.  Vertigo: We will hold use of meclizine until magnesium level is corrected. Follow MRI brain ordered.  H/o CAD -Hold ASA and Plavix for now for possible need for surgery  HTN -On IV Lopressor as needed.  DM -Diet controlled -Prior A1c 7.1 in 2018  HLD -Continue Lipitor -Hold Niaspan and Fenofibrate as these are unlikely to have significant utility during hospitalization  OSA -Does not wear CPAP  Note: This patient has been tested and is negative for the novel coronavirus COVID-19.  DVT prophylaxis:SCDs Code Status:Full - confirmed with patient/family Family Communication:Wife was present throughout evaluation Disposition Plan:Home once clinically improved Consults called:GI; surgery  Procedures:   None  Antimicrobials:   None on IV ertapenem   Subjective: Nausea and vomiting today Tolerating ice chips. Reports vertigo.  Objective: Vitals:   11/05/18 0500 11/05/18 0534 11/05/18 1004 11/05/18 1117  BP:  (!) 123/57 108/60 (!) 112/54  Pulse:  73  69  Resp:  (!) 21    Temp:  98.9 F (37.2 C)    TempSrc:  Oral    SpO2:  97%    Weight: 79.4 kg     Height:        Intake/Output Summary (Last 24 hours) at 11/05/2018 1725 Last data filed at 11/05/2018 0631 Gross per 24 hour  Intake 100 ml  Output 300 ml  Net -200 ml   Filed Weights   11/03/18 0652 11/04/18 0503 11/05/18 0500  Weight: 81.6 kg 77.9 kg 79.4 kg    Examination:  General exam: Acutely ill looking.  Respiratory system: Clear to auscultation.  Cardiovascular system: S1 & S2 heard. Gastrointestinal system: Abdomen is nondistended, soft with mild left lower abdomen/inguinal tenderness.  Organs are difficult to assess. Central nervous system: Alert and oriented.  Patient moves all extremities. Extremities: No leg edema.   Data Reviewed: I have personally reviewed following labs and imaging studies   CBC: Recent Labs  Lab 11/03/18 0648 11/04/18 0355 11/05/18 0626  WBC 11.1* 21.8* 12.9*  HGB 16.7 16.9 13.7  HCT 48.6 48.0 41.4  MCV 92.7 91.3 96.3  PLT 293 295 [id number]   Basic Metabolic Panel: Recent Labs  Lab 11/03/18 0648 11/04/18 0355 11/04/18 1123 11/05/18 0626  NA 141 143 144 145  K 3.1* 3.2* 3.3* 4.3  CL 101 100 102 110  CO2 23 23 26 25   GLUCOSE 158* 149* 138* 98  BUN 7* 21 24* 27*  CREATININE 0.99 1.85* 1.46* 0.92  CALCIUM 9.9 9.4 9.0 8.0*  MG  --   --   --  0.5*  PHOS  --   --  3.2 2.3*   GFR: Estimated Creatinine Clearance: 73.3 mL/min (by C-G formula based on SCr of 0.92 mg/dL). Liver Function Tests: Recent Labs  Lab 11/03/18 0648 11/04/18 1123 11/05/18 0626  AST 21  --   --   ALT 15  --   --   ALKPHOS 84  --   --   BILITOT 1.1  --   --   PROT 7.0  --   --   ALBUMIN 3.9 3.4* 2.8*   No results for input(s): LIPASE, AMYLASE in the last 168 hours. No results for input(s): AMMONIA in the last 168 hours. Coagulation Profile: No results for input(s): INR, PROTIME in the last 168 hours. Cardiac Enzymes: No results for input(s): CKTOTAL, CKMB, CKMBINDEX, TROPONINI in the last 168 hours. BNP (last 3 results) No results for input(s):  PROBNP in the last 8760 hours. HbA1C: No results for input(s): HGBA1C in the last 72 hours. CBG: No results for input(s): GLUCAP in the last 168 hours. Lipid Profile: No results for input(s): CHOL, HDL, LDLCALC, TRIG, CHOLHDL, LDLDIRECT in the last 72 hours. Thyroid Function Tests: No results for input(s): TSH, T4TOTAL, FREET4, T3FREE, THYROIDAB in the last 72 hours. Anemia Panel: No results for input(s): VITAMINB12, FOLATE, FERRITIN, TIBC, IRON, RETICCTPCT in the last 72 hours. Urine analysis:    Component Value Date/Time   COLORURINE AMBER (A) 11/04/2018 1821   APPEARANCEUR HAZY (A) 11/04/2018 1821   LABSPEC >1.046 (H) 11/04/2018 1821   PHURINE 5.0 11/04/2018 1821   GLUCOSEU 50 (A) 11/04/2018 1821   HGBUR NEGATIVE  11/04/2018 1821   BILIRUBINUR NEGATIVE 11/04/2018 1821   KETONESUR 5 (A) 11/04/2018 1821   PROTEINUR 100 (A) 11/04/2018 1821   UROBILINOGEN 0.2 01/22/2010 1943   NITRITE NEGATIVE 11/04/2018 1821   LEUKOCYTESUR NEGATIVE 11/04/2018 1821   Sepsis Labs: @LABRCNTIP (procalcitonin:4,lacticidven:4)  ) Recent Results (from the past 240 hour(s))  SARS Coronavirus 2 [facility] order, Performed in [facility] hospital lab) Nasopharyngeal Nasopharyngeal Swab     Status: None   Collection Time: 11/03/18  3:36 PM   Specimen: Nasopharyngeal Swab  Result Value Ref Range Status   SARS Coronavirus 2 NEGATIVE NEGATIVE Final    Comment: (NOTE) If result is NEGATIVE SARS-CoV-2 target nucleic acids are NOT DETECTED. The SARS-CoV-2 RNA is generally detectable in upper and lower  respiratory specimens during the acute phase of infection. The lowest  concentration of SARS-CoV-2 viral copies this assay can detect is 250  copies / mL. A negative result does not preclude SARS-CoV-2 infection  and should not be used as the sole basis for treatment or other  patient management decisions.  A negative result may occur with  improper specimen collection / handling, submission of specimen other  than nasopharyngeal swab, presence of viral mutation(s) within the  areas targeted by this assay, and inadequate number of viral copies  (<250 copies / mL). A negative result must be combined with clinical  observations, patient history, and epidemiological information. If result is POSITIVE SARS-CoV-2 target nucleic acids are DETECTED. The SARS-CoV-2 RNA is generally detectable in upper and lower  respiratory specimens dur ing the acute phase of infection.  Positive  results are indicative of active infection with SARS-CoV-2.  Clinical  correlation with patient history and other diagnostic information is  necessary to determine patient infection status.  Positive results do  not rule out bacterial infection or  co-infection with other viruses. If result is PRESUMPTIVE POSTIVE SARS-CoV-2 nucleic acids MAY BE PRESENT.   A presumptive positive result was obtained on the submitted specimen  and confirmed on repeat testing.  While 2019 novel coronavirus  (SARS-CoV-2) nucleic acids may be present in the submitted sample  additional confirmatory testing may be necessary for epidemiological  and / or clinical management purposes  to differentiate between  SARS-CoV-2 and other Sarbecovirus currently known to infect humans.  If clinically indicated additional testing with an alternate test  methodology [phone]) is advised. The SARS-CoV-2 RNA is generally  detectable in upper and lower respiratory sp ecimens during the acute  phase of infection. The expected result is Negative. Fact Sheet for Patients:  StrictlyIdeas.no Fact Sheet for Healthcare Providers: BankingDealers.co.za This test is not yet approved or cleared by the [place] FDA and has been authorized for detection and/or diagnosis of SARS-CoV-2 by FDA under an Emergency Use Authorization (  EUA).  This EUA will remain in effect (meaning this test can be used) for the duration of the COVID-19 declaration under Section 564(b)(1) of the Act, 21 U.S.C. section 360bbb-3(b)(1), unless the authorization is terminated or revoked sooner. Performed at [facility], [place] [address] [place], [place] [address]   Culture, blood (routine x 2)     Status: None (Preliminary result)   Collection Time: 11/03/18  5:58 PM   Specimen: BLOOD  Result Value Ref Range Status   Specimen Description BLOOD LEFT ANTECUBITAL  Final   Special Requests   Final    BOTTLES DRAWN AEROBIC AND ANAEROBIC Blood Culture adequate volume   Culture   Final    NO GROWTH 2 DAYS Performed at [facility], [place] [address], [place], [place] [address]    Report Status PENDING  Incomplete  Culture, blood (routine x 2)      Status: None (Preliminary result)   Collection Time: 11/03/18  6:40 PM   Specimen: BLOOD LEFT HAND  Result Value Ref Range Status   Specimen Description BLOOD LEFT HAND  Final   Special Requests   Final    BOTTLES DRAWN AEROBIC AND ANAEROBIC Blood Culture results may not be optimal due to an inadequate volume of blood received in culture bottles   Culture   Final    NO GROWTH 2 DAYS Performed at [facility], [place] [address] [place], [place] [address]    Report Status PENDING  Incomplete  Culture, Urine     Status: None   Collection Time: 11/04/18  6:57 AM   Specimen: Urine, Random  Result Value Ref Range Status   Specimen Description URINE, RANDOM  Final   Special Requests NONE  Final   Culture   Final    NO GROWTH Performed at [facility], [place] [address], [place], [place] [address]    Report Status 11/05/2018 FINAL  Final         Radiology Studies: Dg Abd 1 View  Result Date: 11/04/2018 CLINICAL DATA:  Possible colonic diverticulitis. EXAM: ABDOMEN - 1 VIEW COMPARISON:  CT 11/03/2018 FINDINGS: Bowel gas pattern is nonobstructive. No free peritoneal air. Contrast present over the bladder from recent CT scan. Right iliac stent. Degenerative changes spine with fusion hardware at the L2-3 level. IMPRESSION: Nonobstructive bowel gas pattern. Electronically Signed   By: Marin Olp M.D.   On: 11/04/2018 09:22   US Renal  Result Date: 11/04/2018 CLINICAL DATA:  68 year old male with acute kidney injury. EXAM: RENAL / URINARY TRACT ULTRASOUND COMPLETE COMPARISON:  None. FINDINGS: Right Kidney: Renal measurements: 12.3 x 6.9 x 5.7 cm = volume: 251 mL. Mild cortical thinning noted. Echogenicity within normal limits. No mass or hydronephrosis visualized. Left Kidney: Renal measurements: 12.2 x 6.6 x 5.8 cm = volume: 244 mL. Mild cortical thinning noted. Echogenicity within normal limits. No mass or hydronephrosis visualized. Bladder: Appears normal for degree of bladder  distention. IMPRESSION: Mild bilateral cortical thinning without other significant abnormality. Electronically Signed   By: Margarette Canada M.D.   On: 11/04/2018 15:21   [place] Ekg Site Rite  Result Date: 11/05/2018 If Site Rite image not attached, placement could not be confirmed due to current cardiac rhythm.       Scheduled Meds: . aspirin EC  81 mg Oral Daily  . atorvastatin  80 mg Oral QHS  . metoprolol succinate  25 mg Oral Daily  . sodium chloride flush  3 mL Intravenous  Q12H   Continuous Infusions: . sodium chloride 75 mL/hr at 11/05/18 1124  . ertapenem 1,000 mg (11/05/18 1129)  . potassium PHOSPHATE IVPB (in mmol) 20 mmol (11/05/18 1142)     LOS: 2 days    Time spent: 62 Minutes    Dana Allan, MD  [facility] Pager #: [phone] 7PM-7AM contact night coverage as above

## 2018-11-05 NOTE — Progress Notes (Signed)
CRITICAL VALUE ALERT  Critical Value:  Mg 0.5  Date & Time Notied: 11/05/18 0850  Provider Notified: MD Ogbata @ [phone]  Orders Received/Actions taken: 4g IV Mg ordered

## 2018-11-05 NOTE — Progress Notes (Addendum)
CC: Abdominal pain  Subjective: Patient feels really good this a.m.  He says this is the first time he has not had abdominal pain in over a month.  He has not eaten since Thursday last week.  He also reports 30 pound weight loss since the onset of his recurrent diverticulitis back in June 2020.  Objective: Vital signs in last 24 hours: Temp:  [98.2 F (36.8 C)-99.7 F (37.6 C)] 98.9 F (37.2 C) (09/14 0534) Pulse Rate:  [73-94] 73 (09/14 0534) Resp:  [19-24] 21 (09/14 0534) BP: (108-139)/(57-99) 108/60 (09/14 1004) SpO2:  [95 %-98 %] 97 % (09/14 0534) Weight:  [79.4 kg] 79.4 kg (09/14 0500) Last BM Date: 11/03/18 100 IV 620 urine Afebrile, BP variable On and off O2, O2 in early AM Labs show improvement WBC improving Mag 0.5  Replacement ordered   Intake/Output from previous day: 09/13 0701 - 09/14 0700 In: 100 [IV Piggyback:100] Out: 620 [Urine:620] Intake/Output this shift: No intake/output data recorded.  General appearance: alert, cooperative and no distress Resp: clear to auscultation bilaterally GI: He has no abdominal pain this morning.  This is the first time in a month.  He does have a midline surgical scar from a prior aortobifem bypass graft.  Positive bowel sounds, but no BM.  Lab Results:  Recent Labs    11/04/18 0355 11/05/18 0626  WBC 21.8* 12.9*  HGB 16.9 13.7  HCT 48.0 41.4  PLT 295 230    BMET Recent Labs    11/04/18 1123 11/05/18 0626  NA 144 145  K 3.3* 4.3  CL 102 110  CO2 26 25  GLUCOSE 138* 98  BUN 24* 27*  CREATININE 1.46* 0.92  CALCIUM 9.0 8.0*   PT/INR No results for input(s): LABPROT, INR in the last 72 hours.  Recent Labs  Lab 11/03/18 0648 11/04/18 1123 11/05/18 0626  AST 21  --   --   ALT 15  --   --   ALKPHOS 84  --   --   BILITOT 1.1  --   --   PROT 7.0  --   --   ALBUMIN 3.9 3.4* 2.8*     Lipase     Component Value Date/Time   LIPASE 25 03/24/2017 1857     Medications: . atorvastatin  80 mg Oral  QHS  . sodium chloride flush  3 mL Intravenous Q12H   . sodium chloride  @ 75 cc/hr in orders  . ertapenem Stopped (11/04/18 1049)  . magnesium sulfate bolus IVPB 4 g (11/05/18 0909)  . potassium PHOSPHATE IVPB (in mmol)      Assessment/Plan Sepsis secondary to diverticulitis - blood cultures pending AKI improving   - Creatinine 1.85>>1.46>>0.92(9/14) Hx CAD - Plavix/ASA on hold Hypertension Type II diabetes - diet controlled OSA - not on CPAP Hypomagnesemia - Mg 0.5 this AM Malnutrition - Moderate 12.9 this AM Possible UTI - 21- 50 WBC/HPF (9/13)- No growth on urine culture COVID negative 11/03/18   Acute on chronic diverticulitis with microperforation  -complicated disease recs with wbc of 21 would start invanz I have written for this  -npo, likely needs meds converted to iv  -check xray this am, may need ng tube present  -will check ntn parameters  -he is likely going to need sigmoidectomy with likely ostomy in near future   FEN-would recommend starting tpn now due to history and likely npo for some            time  Currently NPO and no IV fluids listed VTE-he can start lovenox today from my standpoint and would recommend                  that, scds ordered  ID:  Maxipime 9/12  X 2 dose;  invanx 9/13 >> day 2 Follow up:  TBD  Plan: He is markedly improved after 1 day of Invanz.  He is significantly malnourished and reports a 30 pound weight loss over the last 3 months.  I will put him on ice chips and sips right now.  I will also add a PICC line and start TNA for his significant malnutrition.  Continue Invanz.    We discussed treatment for diverticulitis.  If we can get him over this bout of diverticulitis without surgery he could most likely have a single stage partial colectomy in the future.  If he fails antibiotic therapy on the Invanz as he has the other antibiotics since June; he would most likely require a colostomy.   LOS: 2 days    JENNINGS,WILLARD 11/05/2018 [phone]  Agree with above. Wife, Olegario Shearer, at bedside.  They have 3 sons.  He has been dizzy today, which makes anything more than sitting up difficult. But overall, feels better. I reviewed with patient and his wife the options of surgery - one stage vs two stage.  It remains to be seen how he will do.  Alphonsa Overall, MD, [facility] Surgery Pager: [phone] Office phone:  [phone]

## 2018-11-05 NOTE — Progress Notes (Signed)
PHARMACY - ADULT TOTAL PARENTERAL NUTRITION CONSULT NOTE   Pharmacy Consult for TPN Indication: Malnutrition in setting of diverticulitis with microperforation  Patient Measurements: Height: 5\' 8"  (172.7 cm) Weight: 175 lb 0.7 oz (79.4 kg) IBW/kg (Calculated) : 68.4 TPN AdjBW (KG): 81.6 Body mass index is 26.62 kg/m.   Assessment:  Patient with sigmoid diverticulitis with microperforations and abscess. Has had significant weight lost, about 30 lbs, in the past 3 months. Planning bowel rest, antibiotics, TPN for potential conservative treatment.    Plan:  -Due to entry after 12pm, TPN will begin on 9/15  Harvel Quale 11/05/2018,1:31 PM

## 2018-11-05 NOTE — Progress Notes (Signed)
Peripherally Inserted Central Catheter/Midline Placement  The IV Nurse has discussed with the patient and/or persons authorized to consent for the patient, the purpose of this procedure and the potential benefits and risks involved with this procedure.  The benefits include less needle sticks, lab draws from the catheter, and the patient may be discharged home with the catheter. Risks include, but not limited to, infection, bleeding, blood clot (thrombus formation), and puncture of an artery; nerve damage and irregular heartbeat and possibility to perform a PICC exchange if needed/ordered by physician.  Alternatives to this procedure were also discussed.  Bard Power PICC patient education guide, fact sheet on infection prevention and patient information card has been provided to patient /or left at bedside.    PICC/Midline Placement Documentation  PICC Double Lumen [id number] PICC Right Basilic 38 cm 0 cm (Active)  Indication for Insertion or Continuance of Line Administration of hyperosmolar/irritating solutions (i.e. TPN, Vancomycin, etc.) 11/05/18 2155  Exposed Catheter (cm) 0 cm 11/05/18 2155  Site Assessment Clean;Dry;Intact 11/05/18 2155  Lumen #1 Status Flushed;Saline locked;Blood return noted 11/05/18 2155  Lumen #2 Status Flushed;Saline locked;Blood return noted 11/05/18 2155  Dressing Type Transparent 11/05/18 2155  Dressing Status Clean;Dry;Intact;Antimicrobial disc in place 11/05/18 2155  Dressing Change Due 11/12/18 11/05/18 2155       Gordan Payment 11/05/2018, 9:56 PM

## 2018-11-05 NOTE — Progress Notes (Addendum)
Subjective: The patient was seen and examined at bedside.  He appears more comfortable than yesterday, however complains of feeling dizzy and has not been able to get out of bed to chair or walk around.  He has not had a bowel movement for 2 days, denies nausea or abdominal pain.  Objective: Vital signs in last 24 hours: Temp:  [98.2 F (36.8 C)-99.7 F (37.6 C)] 98.9 F (37.2 C) (09/14 0534) Pulse Rate:  [73-94] 73 (09/14 0534) Resp:  [19-24] 21 (09/14 0534) BP: (123-139)/(57-99) 123/57 (09/14 0534) SpO2:  [95 %-98 %] 97 % (09/14 0534) Weight:  [79.4 kg] 79.4 kg (09/14 0500) Weight change: 1.5 kg Last BM Date: 11/03/18  PE: No pallor, no icterus GENERAL: Sitting up on bed, appears comfortable ABDOMEN: Soft, nontender, hyperactive bowel sounds EXTREMITIES: No deformity  Lab Results: Results for orders placed or performed during the hospital encounter of 11/03/18 (from the past 48 hour(s))  Troponin I (High Sensitivity)     Status: None   Collection Time: 11/03/18 11:46 AM  Result Value Ref Range   Troponin I (High Sensitivity) 5 <18 ng/L    Comment: (NOTE) Elevated high sensitivity troponin I (hsTnI) values and significant  changes across serial measurements may suggest ACS but many other  chronic and acute conditions are known to elevate hsTnI results.  Refer to the "Links" section for chest pain algorithms and additional  guidance. Performed at [facility], [place] [address] [place], [place] [address]   SARS Coronavirus 2 [facility] order, Performed in [facility] hospital lab) Nasopharyngeal Nasopharyngeal Swab     Status: None   Collection Time: 11/03/18  3:36 PM   Specimen: Nasopharyngeal Swab  Result Value Ref Range   SARS Coronavirus 2 NEGATIVE NEGATIVE    Comment: (NOTE) If result is NEGATIVE SARS-CoV-2 target nucleic acids are NOT DETECTED. The SARS-CoV-2 RNA is generally detectable in upper and lower  respiratory specimens during the acute phase of  infection. The lowest  concentration of SARS-CoV-2 viral copies this assay can detect is 250  copies / mL. A negative result does not preclude SARS-CoV-2 infection  and should not be used as the sole basis for treatment or other  patient management decisions.  A negative result may occur with  improper specimen collection / handling, submission of specimen other  than nasopharyngeal swab, presence of viral mutation(s) within the  areas targeted by this assay, and inadequate number of viral copies  (<250 copies / mL). A negative result must be combined with clinical  observations, patient history, and epidemiological information. If result is POSITIVE SARS-CoV-2 target nucleic acids are DETECTED. The SARS-CoV-2 RNA is generally detectable in upper and lower  respiratory specimens dur ing the acute phase of infection.  Positive  results are indicative of active infection with SARS-CoV-2.  Clinical  correlation with patient history and other diagnostic information is  necessary to determine patient infection status.  Positive results do  not rule out bacterial infection or co-infection with other viruses. If result is PRESUMPTIVE POSTIVE SARS-CoV-2 nucleic acids MAY BE PRESENT.   A presumptive positive result was obtained on the submitted specimen  and confirmed on repeat testing.  While 2019 novel coronavirus  (SARS-CoV-2) nucleic acids may be present in the submitted sample  additional confirmatory testing may be necessary for epidemiological  and / or clinical management purposes  to differentiate between  SARS-CoV-2 and other Sarbecovirus currently known to infect humans.  If clinically indicated additional testing with an alternate  test  methodology [phone]) is advised. The SARS-CoV-2 RNA is generally  detectable in upper and lower respiratory sp ecimens during the acute  phase of infection. The expected result is Negative. Fact Sheet for Patients:   StrictlyIdeas.no Fact Sheet for Healthcare Providers: BankingDealers.co.za This test is not yet approved or cleared by the [place] FDA and has been authorized for detection and/or diagnosis of SARS-CoV-2 by FDA under an Emergency Use Authorization (EUA).  This EUA will remain in effect (meaning this test can be used) for the duration of the COVID-19 declaration under Section 564(b)(1) of the Act, 21 U.S.C. section 360bbb-3(b)(1), unless the authorization is terminated or revoked sooner. Performed at [facility], [place] [address], [place], [place] [address]   Lactic acid, plasma     Status: Abnormal   Collection Time: 11/03/18  5:47 PM  Result Value Ref Range   Lactic Acid, Venous 2.4 (HH) 0.5 - 1.9 mmol/L    Comment: CRITICAL RESULT CALLED TO, READ BACK BY AND VERIFIED WITH: Jeraldine Loots RN AT [place] 11/03/2018 BY [facility] Performed at [facility], [place] [address], [place], [place] [address]   Procalcitonin     Status: None   Collection Time: 11/03/18  5:47 PM  Result Value Ref Range   Procalcitonin <0.10 ng/mL    Comment:        Interpretation: PCT (Procalcitonin) <= 0.5 ng/mL: Systemic infection (sepsis) is not likely. Local bacterial infection is possible. (NOTE)       Sepsis PCT Algorithm           Lower Respiratory Tract                                      Infection PCT Algorithm    ----------------------------     ----------------------------         PCT < 0.25 ng/mL                PCT < 0.10 ng/mL         Strongly encourage             Strongly discourage   discontinuation of antibiotics    initiation of antibiotics    ----------------------------     -----------------------------       PCT 0.25 - 0.50 ng/mL            PCT 0.10 - 0.25 ng/mL               OR       >80% decrease in PCT            Discourage initiation of                                            antibiotics      Encourage discontinuation            of antibiotics    ----------------------------     -----------------------------         PCT >= 0.50 ng/mL              PCT 0.26 - 0.50 ng/mL               AND        <80% decrease in PCT  Encourage initiation of                                             antibiotics       Encourage continuation           of antibiotics    ----------------------------     -----------------------------        PCT >= 0.50 ng/mL                  PCT > 0.50 ng/mL               AND         increase in PCT                  Strongly encourage                                      initiation of antibiotics    Strongly encourage escalation           of antibiotics                                     -----------------------------                                           PCT <= 0.25 ng/mL                                                 OR                                        > 80% decrease in PCT                                     Discontinue / Do not initiate                                             antibiotics Performed at [facility], [address] [place], [place] [address]   Culture, blood (routine x 2)     Status: None (Preliminary result)   Collection Time: 11/03/18  5:58 PM   Specimen: BLOOD  Result Value Ref Range   Specimen Description BLOOD LEFT ANTECUBITAL    Special Requests      BOTTLES DRAWN AEROBIC AND ANAEROBIC Blood Culture adequate volume   Culture      NO GROWTH < 24 HOURS Performed at [facility], [place] [address], [place], [place] [address]    Report Status PENDING   Culture, blood (routine x 2)     Status: None (Preliminary result)   Collection Time: 11/03/18  6:40 PM  Specimen: BLOOD LEFT HAND  Result Value Ref Range   Specimen Description BLOOD LEFT HAND    Special Requests      BOTTLES DRAWN AEROBIC AND ANAEROBIC Blood Culture results may not be optimal due to an inadequate volume of blood received in culture bottles   Culture       NO GROWTH < 24 HOURS Performed at [place] [address] [place], [place] [address]    Report Status PENDING   Lactic acid, plasma     Status: None   Collection Time: 11/03/18  9:16 PM  Result Value Ref Range   Lactic Acid, Venous 1.9 0.5 - 1.9 mmol/L    Comment: Performed at [facility] [address], [place], [place] [id number]  Basic metabolic panel     Status: Abnormal   Collection Time: 11/04/18  3:55 AM  Result Value Ref Range   Sodium 143 135 - 145 mmol/L   Potassium 3.2 (L) 3.5 - 5.1 mmol/L   Chloride 100 98 - 111 mmol/L   CO2 23 22 - 32 mmol/L   Glucose, Bld 149 (H) 70 - 99 mg/dL   BUN 21 8 - 23 mg/dL   Creatinine, Ser 1.85 (H) 0.61 - 1.24 mg/dL   Calcium 9.4 8.9 - 10.3 mg/dL   GFR calc non Af Amer 36 (L) >60 mL/min   GFR calc Af Amer 42 (L) >60 mL/min   Anion gap 20 (H) 5 - 15    Comment: Performed at [facility], [place] [address], [place], [place] [address]  CBC     Status: Abnormal   Collection Time: 11/04/18  3:55 AM  Result Value Ref Range   WBC 21.8 (H) 4.0 - 10.5 K/uL   RBC 5.26 4.22 - 5.81 MIL/uL   Hemoglobin 16.9 13.0 - 17.0 g/dL   HCT 48.0 39.0 - 52.0 %   MCV 91.3 80.0 - 100.0 fL   MCH 32.1 26.0 - 34.0 pg   MCHC 35.2 30.0 - 36.0 g/dL   RDW 13.0 11.5 - 15.5 %   Platelets 295 150 - 400 K/uL   nRBC 0.0 0.0 - 0.2 %    Comment: Performed at [facility], [place] [address], [place], [place] [address]  Culture, Urine     Status: None   Collection Time: 11/04/18  6:57 AM   Specimen: Urine, Random  Result Value Ref Range   Specimen Description URINE, RANDOM    Special Requests NONE    Culture      NO GROWTH Performed at [facility], [place] [address], [place], [place] [address]    Report Status 11/05/2018 FINAL   Renal function panel     Status: Abnormal   Collection Time: 11/04/18 11:23 AM  Result Value Ref Range   Sodium 144 135 - 145 mmol/L   Potassium 3.3 (L) 3.5 - 5.1 mmol/L   Chloride 102 98 - 111 mmol/L   CO2 26  22 - 32 mmol/L   Glucose, Bld 138 (H) 70 - 99 mg/dL   BUN 24 (H) 8 - 23 mg/dL   Creatinine, Ser 1.46 (H) 0.61 - 1.24 mg/dL   Calcium 9.0 8.9 - 10.3 mg/dL   Phosphorus 3.2 2.5 - 4.6 mg/dL   Albumin 3.4 (L) 3.5 - 5.0 g/dL   GFR calc non Af Amer 48 (L) >60 mL/min   GFR calc Af Amer 56 (L) >60 mL/min   Anion gap 16 (H) 5 -  15    Comment: Performed at [facility], [place] [address] [place], [place] [address]  Urinalysis, Routine w reflex microscopic     Status: Abnormal   Collection Time: 11/04/18  6:21 PM  Result Value Ref Range   Color, Urine AMBER (A) YELLOW    Comment: BIOCHEMICALS MAY BE AFFECTED BY COLOR   APPearance HAZY (A) CLEAR   Specific Gravity, Urine >1.046 (H) 1.005 - 1.030   pH 5.0 5.0 - 8.0   Glucose, UA 50 (A) NEGATIVE mg/dL   Hgb urine dipstick NEGATIVE NEGATIVE   Bilirubin Urine NEGATIVE NEGATIVE   Ketones, ur 5 (A) NEGATIVE mg/dL   Protein, ur 100 (A) NEGATIVE mg/dL   Nitrite NEGATIVE NEGATIVE   Leukocytes,Ua NEGATIVE NEGATIVE   RBC / HPF 0-5 0 - 5 RBC/hpf   WBC, UA 21-50 0 - 5 WBC/hpf   Bacteria, UA NONE SEEN NONE SEEN   Mucus PRESENT    Hyaline Casts, UA PRESENT    Cellular Cast, UA 3     Comment: Performed at [facility], [address], [place], [place] [address]  Sodium, urine, random     Status: None   Collection Time: 11/04/18  6:21 PM  Result Value Ref Range   Sodium, Ur <10 mmol/L    Comment: Performed at [place] [address] [place], [place] [address]  CBC     Status: Abnormal   Collection Time: 11/05/18  6:26 AM  Result Value Ref Range   WBC 12.9 (H) 4.0 - 10.5 K/uL   RBC 4.30 4.22 - 5.81 MIL/uL   Hemoglobin 13.7 13.0 - 17.0 g/dL   HCT 41.4 39.0 - 52.0 %   MCV 96.3 80.0 - 100.0 fL   MCH 31.9 26.0 - 34.0 pg   MCHC 33.1 30.0 - 36.0 g/dL   RDW 13.2 11.5 - 15.5 %   Platelets 230 150 - 400 K/uL   nRBC 0.0 0.0 - 0.2 %    Comment: Performed at [facility], [place] [address], [place], [place] [address]     Studies/Results: Dg Abd 1 View  Result Date: 11/04/2018 CLINICAL DATA:  Possible colonic diverticulitis. EXAM: ABDOMEN - 1 VIEW COMPARISON:  CT 11/03/2018 FINDINGS: Bowel gas pattern is nonobstructive. No free peritoneal air. Contrast present over the bladder from recent CT scan. Right iliac stent. Degenerative changes spine with fusion hardware at the L2-3 level. IMPRESSION: Nonobstructive bowel gas pattern. Electronically Signed   By: Marin Olp M.D.   On: 11/04/2018 09:22   Ct Abdomen Pelvis W Contrast  Result Date: 11/03/2018 CLINICAL DATA:  Worsening abdominal pain and tenderness. Worsening nausea and vomiting. Weight loss. Follow-up diverticulitis. EXAM: CT ABDOMEN AND PELVIS WITH CONTRAST TECHNIQUE: Multidetector CT imaging of the abdomen and pelvis was performed using the standard protocol following bolus administration of intravenous contrast. CONTRAST:  11mL OMNIPAQUE IOHEXOL 300 MG/ML  SOLN COMPARISON:  10/01/2018 FINDINGS: Lower Chest: No acute findings. Hepatobiliary: No hepatic masses identified. Gallbladder is unremarkable. No evidence of biliary ductal dilatation. Pancreas:  No mass or inflammatory changes. Spleen: Within normal limits in size and appearance. Adrenals/Urinary Tract: No masses identified. No evidence of hydronephrosis. Stomach/Bowel: Mild diverticulitis again seen involving the distal sigmoid colon, without significant change. A tiny extraluminal gas and fluid collection is seen in the adjacent sigmoid mesentery measuring 1.6 cm on image 113/6. This is also stable and consistent with a micro perforation and small diverticular abscess. No evidence of bowel obstruction.  Vascular/Lymphatic: No pathologically enlarged lymph nodes. Stable postop changes from previous aortobifemoral bypass grafting. Reproductive:  No mass or other significant abnormality. Other:  None. Musculoskeletal: No suspicious bone lesions identified. Prior lumbar spine fusion again noted. IMPRESSION:  1. No significant change in mild distal sigmoid diverticulitis, with micro-perforation and small diverticular abscess. 2. No other complication identified. Electronically Signed   By: Marlaine Hind M.D.   On: 11/03/2018 13:21   US Renal  Result Date: 11/04/2018 CLINICAL DATA:  68 year old male with acute kidney injury. EXAM: RENAL / URINARY TRACT ULTRASOUND COMPLETE COMPARISON:  None. FINDINGS: Right Kidney: Renal measurements: 12.3 x 6.9 x 5.7 cm = volume: 251 mL. Mild cortical thinning noted. Echogenicity within normal limits. No mass or hydronephrosis visualized. Left Kidney: Renal measurements: 12.2 x 6.6 x 5.8 cm = volume: 244 mL. Mild cortical thinning noted. Echogenicity within normal limits. No mass or hydronephrosis visualized. Bladder: Appears normal for degree of bladder distention. IMPRESSION: Mild bilateral cortical thinning without other significant abnormality. Electronically Signed   By: Margarette Canada M.D.   On: 11/04/2018 15:21    Medications: I have reviewed the patient's current medications.  Assessment: Acute complicated sigmoid diverticulitis with microperforation and a small diverticular abscess Improvement in leukocytosis Improvement in renal function Improvement in lactic acid levels  Plan: Patient doing well in the last 24 hours, continues to remain n.p.o. and is being followed by surgical team. Possible sigmoid resection as he has failed multiple rounds of oral antibiotic as an outpatient, and is receiving treatment for diverticulitis since June 2020 Continue IV fluids at 125 mL/h, continue IV ertapenem Patient may benefit from meclizine for vertigo.   [name] 11/05/2018, 8:48 AM   Pager [phone] If no answer or after 5 PM call [phone]

## 2018-11-06 LAB — CBC WITH DIFFERENTIAL/PLATELET
Abs Immature Granulocytes: 0.03 10*3/uL (ref 0.00–0.07)
Basophils Absolute: 0 10*3/uL (ref 0.0–0.1)
Basophils Relative: 0 %
Eosinophils Absolute: 0.2 10*3/uL (ref 0.0–0.5)
Eosinophils Relative: 2 %
HCT: 37.3 % — ABNORMAL LOW (ref 39.0–52.0)
Hemoglobin: 12.6 g/dL — ABNORMAL LOW (ref 13.0–17.0)
Immature Granulocytes: 0 %
Lymphocytes Relative: 20 %
Lymphs Abs: 1.7 10*3/uL (ref 0.7–4.0)
MCH: 32.9 pg (ref 26.0–34.0)
MCHC: 33.8 g/dL (ref 30.0–36.0)
MCV: 97.4 fL (ref 80.0–100.0)
Monocytes Absolute: 0.5 10*3/uL (ref 0.1–1.0)
Monocytes Relative: 6 %
Neutro Abs: 6.2 10*3/uL (ref 1.7–7.7)
Neutrophils Relative %: 72 %
Platelets: 197 10*3/uL (ref 150–400)
RBC: 3.83 MIL/uL — ABNORMAL LOW (ref 4.22–5.81)
RDW: 13.1 % (ref 11.5–15.5)
WBC: 8.6 10*3/uL (ref 4.0–10.5)
nRBC: 0 % (ref 0.0–0.2)

## 2018-11-06 LAB — COMPREHENSIVE METABOLIC PANEL
ALT: 16 U/L (ref 0–44)
AST: 38 U/L (ref 15–41)
Albumin: 2.6 g/dL — ABNORMAL LOW (ref 3.5–5.0)
Alkaline Phosphatase: 60 U/L (ref 38–126)
Anion gap: 9 (ref 5–15)
BUN: 17 mg/dL (ref 8–23)
CO2: 22 mmol/L (ref 22–32)
Calcium: 7.6 mg/dL — ABNORMAL LOW (ref 8.9–10.3)
Chloride: 111 mmol/L (ref 98–111)
Creatinine, Ser: 0.61 mg/dL (ref 0.61–1.24)
GFR calc Af Amer: >60 mL/min (ref >60)
GFR calc non Af Amer: >60 mL/min (ref >60)
Glucose, Bld: 79 mg/dL (ref 70–99)
Potassium: 3.9 mmol/L (ref 3.5–5.1)
Sodium: 142 mmol/L (ref 135–145)
Total Bilirubin: 1.3 mg/dL — ABNORMAL HIGH (ref 0.3–1.2)
Total Protein: 4.5 g/dL — ABNORMAL LOW (ref 6.5–8.1)

## 2018-11-06 LAB — GLUCOSE, CAPILLARY
Glucose-Capillary: 74 mg/dL (ref 70–99)
Glucose-Capillary: 70 mg/dL (ref 70–99)
Glucose-Capillary: 64 mg/dL — ABNORMAL LOW (ref 70–99)
Glucose-Capillary: 100 mg/dL — ABNORMAL HIGH (ref 70–99)
Glucose-Capillary: 92 mg/dL (ref 70–99)

## 2018-11-06 LAB — PHOSPHORUS
Phosphorus: 2 mg/dL — ABNORMAL LOW (ref 2.5–4.6)
Phosphorus: 2 mg/dL — ABNORMAL LOW (ref 2.5–4.6)

## 2018-11-06 LAB — MAGNESIUM: Magnesium: 1.8 mg/dL (ref 1.7–2.4)

## 2018-11-06 LAB — TRIGLYCERIDES: Triglycerides: 141 mg/dL (ref <150)

## 2018-11-06 LAB — C4 COMPLEMENT: Complement C4, Body Fluid: 23 mg/dL (ref 14–44)

## 2018-11-06 LAB — C3 COMPLEMENT: C3 Complement: 192 mg/dL — ABNORMAL HIGH (ref 82–167)

## 2018-11-06 LAB — PREALBUMIN: Prealbumin: 14.7 mg/dL — ABNORMAL LOW (ref 18–38)

## 2018-11-06 MED ORDER — DEXTROSE 50 % IV SOLN
INTRAVENOUS | Status: AC
  Administered 2018-11-06: 25 mL
  Filled 2018-11-06: qty 50

## 2018-11-06 MED ORDER — NITROGLYCERIN 0.4 MG SL SUBL
0.4000 mg | SUBLINGUAL_TABLET | SUBLINGUAL | Status: DC | PRN
  Administered 2018-11-06: 0.4 mg via SUBLINGUAL
  Filled 2018-11-06: qty 1

## 2018-11-06 MED ORDER — TRAVASOL 10 % IV SOLN
INTRAVENOUS | Status: AC
  Administered 2018-11-06: 18:00:00 via INTRAVENOUS
  Filled 2018-11-06: qty 528

## 2018-11-06 MED ORDER — GADOBUTROL 1 MMOL/ML IV SOLN
7.5000 mL | Freq: Once | INTRAVENOUS | Status: AC | PRN
  Administered 2018-11-06: 7.5 mL via INTRAVENOUS

## 2018-11-06 MED ORDER — INSULIN ASPART 100 UNIT/ML ~~LOC~~ SOLN
0.0000 [IU] | SUBCUTANEOUS | Status: DC
  Administered 2018-11-07 – 2018-11-08 (×5): 1 [IU] via SUBCUTANEOUS
  Administered 2018-11-09: 18:00:00 2 [IU] via SUBCUTANEOUS
  Administered 2018-11-09: 1 [IU] via SUBCUTANEOUS
  Administered 2018-11-09: 2 [IU] via SUBCUTANEOUS
  Administered 2018-11-09 (×2): 1 [IU] via SUBCUTANEOUS
  Administered 2018-11-10: 2 [IU] via SUBCUTANEOUS
  Administered 2018-11-10: 05:00:00 1 [IU] via SUBCUTANEOUS

## 2018-11-06 NOTE — Progress Notes (Signed)
Subjective: The patient was seen and examined at bedside.  He continues to complain of dizziness and vertigo with any kind of movement.  He has not had any vomiting or abdominal pain.  No bowel movements in the last 4 days, however has been passing flatus.  Objective: Vital signs in last 24 hours: Temp:  [97.7 F (36.5 C)] 97.7 F (36.5 C) (09/15 0538) Pulse Rate:  [60-69] 60 (09/15 0538) Resp:  [13-18] 18 (09/15 0538) BP: (108-126)/(54-70) 126/70 (09/15 0538) SpO2:  [95 %-100 %] 100 % (09/15 0538) Weight:  [80.6 kg] 80.6 kg (09/15 0538) Weight change: 1.2 kg Last BM Date: 11/03/18  PE: Appears comfortable GENERAL: No pallor, no icterus ABDOMEN: Soft, mild left lower quadrant tenderness, normal active bowel sounds EXTREMITIES: No deformity  Lab Results: Results for orders placed or performed during the hospital encounter of 11/03/18 (from the past 48 hour(s))  Renal function panel     Status: Abnormal   Collection Time: 11/04/18 11:23 AM  Result Value Ref Range   Sodium 144 135 - 145 mmol/L   Potassium 3.3 (L) 3.5 - 5.1 mmol/L   Chloride 102 98 - 111 mmol/L   CO2 26 22 - 32 mmol/L   Glucose, Bld 138 (H) 70 - 99 mg/dL   BUN 24 (H) 8 - 23 mg/dL   Creatinine, Ser 1.46 (H) 0.61 - 1.24 mg/dL   Calcium 9.0 8.9 - 10.3 mg/dL   Phosphorus 3.2 2.5 - 4.6 mg/dL   Albumin 3.4 (L) 3.5 - 5.0 g/dL   GFR calc non Af Amer 48 (L) >60 mL/min   GFR calc Af Amer 56 (L) >60 mL/min   Anion gap 16 (H) 5 - 15    Comment: Performed at [facility], [address] [place], [place] [address]  C3 complement     Status: Abnormal   Collection Time: 11/04/18  2:55 PM  Result Value Ref Range   C3 Complement 192 (H) 82 - 167 mg/dL    Comment: (NOTE) Performed At: [facility] [place], [place] [id number] Rush Farmer MD [id number]   C4 complement     Status: None   Collection Time: 11/04/18  2:55 PM  Result Value Ref Range   Complement C4, Body Fluid 23 14 - 44  mg/dL    Comment: (NOTE) Performed At: [facility] [address] [place], [place] [id number] Rush Farmer MD [id number]   ANA, IFA (with reflex)     Status: None   Collection Time: 11/04/18  2:55 PM  Result Value Ref Range   ANA Ab, IFA Negative     Comment: (NOTE)                                     Negative   <1:80                                     Borderline  1:80                                     Positive   >1:80 Performed At: [facility] [address] [place], [place] [id number] Rush Farmer MD [id number]   Urinalysis, Routine w reflex microscopic     Status: Abnormal  Collection Time: 11/04/18  6:21 PM  Result Value Ref Range   Color, Urine AMBER (A) YELLOW    Comment: BIOCHEMICALS MAY BE AFFECTED BY COLOR   APPearance HAZY (A) CLEAR   Specific Gravity, Urine >1.046 (H) 1.005 - 1.030   pH 5.0 5.0 - 8.0   Glucose, UA 50 (A) NEGATIVE mg/dL   Hgb urine dipstick NEGATIVE NEGATIVE   Bilirubin Urine NEGATIVE NEGATIVE   Ketones, ur 5 (A) NEGATIVE mg/dL   Protein, ur 100 (A) NEGATIVE mg/dL   Nitrite NEGATIVE NEGATIVE   Leukocytes,Ua NEGATIVE NEGATIVE   RBC / HPF 0-5 0 - 5 RBC/hpf   WBC, UA 21-50 0 - 5 WBC/hpf   Bacteria, UA NONE SEEN NONE SEEN   Mucus PRESENT    Hyaline Casts, UA PRESENT    Cellular Cast, UA 3     Comment: Performed at [facility], [address] [place], [place] [address]  Sodium, urine, random     Status: None   Collection Time: 11/04/18  6:21 PM  Result Value Ref Range   Sodium, Ur <10 mmol/L    Comment: Performed at [facility] [address], [place], [place] [address]  CBC     Status: Abnormal   Collection Time: 11/05/18  6:26 AM  Result Value Ref Range   WBC 12.9 (H) 4.0 - 10.5 K/uL   RBC 4.30 4.22 - 5.81 MIL/uL   Hemoglobin 13.7 13.0 - 17.0 g/dL   HCT 41.4 39.0 - 52.0 %   MCV 96.3 80.0 - 100.0 fL   MCH 31.9 26.0 - 34.0 pg   MCHC 33.1 30.0 - 36.0 g/dL   RDW 13.2 11.5 - 15.5 %   Platelets  230 150 - 400 K/uL   nRBC 0.0 0.0 - 0.2 %    Comment: Performed at [facility], [place] [address] [place], [place] [address]  Prealbumin     Status: Abnormal   Collection Time: 11/05/18  6:26 AM  Result Value Ref Range   Prealbumin 15.2 (L) 18 - 38 mg/dL    Comment: Performed at [facility] [address] [place], [place] [address]  Renal function panel     Status: Abnormal   Collection Time: 11/05/18  6:26 AM  Result Value Ref Range   Sodium 145 135 - 145 mmol/L   Potassium 4.3 3.5 - 5.1 mmol/L    Comment: DELTA CHECK NOTED   Chloride 110 98 - 111 mmol/L   CO2 25 22 - 32 mmol/L   Glucose, Bld 98 70 - 99 mg/dL   BUN 27 (H) 8 - 23 mg/dL   Creatinine, Ser 0.92 0.61 - 1.24 mg/dL   Calcium 8.0 (L) 8.9 - 10.3 mg/dL   Phosphorus 2.3 (L) 2.5 - 4.6 mg/dL   Albumin 2.8 (L) 3.5 - 5.0 g/dL   GFR calc non Af Amer >60 >60 mL/min   GFR calc Af Amer >60 >60 mL/min   Anion gap 10 5 - 15    Comment: Performed at [facility], [place] [address] [place], [place] [address]  Magnesium     Status: Abnormal   Collection Time: 11/05/18  6:26 AM  Result Value Ref Range   Magnesium 0.5 (LL) 1.7 - 2.4 mg/dL    Comment: CRITICAL RESULT CALLED TO, READ BACK BY AND VERIFIED WITH: MUELLER,M RN 11/05/2018 0848 JORDANS Performed at [facility] [address] [place], [place] [address]   Renal function  panel     Status: Abnormal   Collection Time: 11/05/18  6:33 PM  Result Value Ref Range   Sodium 142 135 - 145 mmol/L   Potassium 4.5 3.5 - 5.1 mmol/L   Chloride 108 98 - 111 mmol/L   CO2 25 22 - 32 mmol/L   Glucose, Bld 86 70 - 99 mg/dL   BUN 23 8 - 23 mg/dL   Creatinine, Ser 0.70 0.61 - 1.24 mg/dL   Calcium 8.0 (L) 8.9 - 10.3 mg/dL   Phosphorus 2.9 2.5 - 4.6 mg/dL   Albumin 2.8 (L) 3.5 - 5.0 g/dL   GFR calc non Af Amer >60 >60 mL/min   GFR calc Af Amer >60 >60 mL/min   Anion gap 9 5 - 15    Comment: Performed at [facility], [place] [address], [place], [place] [address]   Magnesium     Status: Abnormal   Collection Time: 11/05/18  6:33 PM  Result Value Ref Range   Magnesium 1.4 (L) 1.7 - 2.4 mg/dL    Comment: Performed at [facility] [address], [place], [place] [address]  Magnesium     Status: None   Collection Time: 11/06/18  5:41 AM  Result Value Ref Range   Magnesium 1.8 1.7 - 2.4 mg/dL    Comment: Performed at [facility] [address] [place], [place] [address]  CBC with Differential/Platelet     Status: Abnormal   Collection Time: 11/06/18  5:41 AM  Result Value Ref Range   WBC 8.6 4.0 - 10.5 K/uL   RBC 3.83 (L) 4.22 - 5.81 MIL/uL   Hemoglobin 12.6 (L) 13.0 - 17.0 g/dL   HCT 37.3 (L) 39.0 - 52.0 %   MCV 97.4 80.0 - 100.0 fL   MCH 32.9 26.0 - 34.0 pg   MCHC 33.8 30.0 - 36.0 g/dL   RDW 13.1 11.5 - 15.5 %   Platelets 197 150 - 400 K/uL   nRBC 0.0 0.0 - 0.2 %   Neutrophils Relative % 72 %   Neutro Abs 6.2 1.7 - 7.7 K/uL   Lymphocytes Relative 20 %   Lymphs Abs 1.7 0.7 - 4.0 K/uL   Monocytes Relative 6 %   Monocytes Absolute 0.5 0.1 - 1.0 K/uL   Eosinophils Relative 2 %   Eosinophils Absolute 0.2 0.0 - 0.5 K/uL   Basophils Relative 0 %   Basophils Absolute 0.0 0.0 - 0.1 K/uL   Immature Granulocytes 0 %   Abs Immature Granulocytes 0.03 0.00 - 0.07 K/uL    Comment: Performed at [facility], [address], [place],  [address]  Comprehensive metabolic panel     Status: Abnormal   Collection Time: 11/06/18  5:41 AM  Result Value Ref Range   Sodium 142 135 - 145 mmol/L   Potassium 3.9 3.5 - 5.1 mmol/L   Chloride 111 98 - 111 mmol/L   CO2 22 22 - 32 mmol/L   Glucose, Bld 79 70 - 99 mg/dL   BUN 17 8 - 23 mg/dL   Creatinine, Ser 0.61 0.61 - 1.24 mg/dL   Calcium 7.6 (L) 8.9 - 10.3 mg/dL   Total Protein 4.5 (L) 6.5 - 8.1 g/dL   Albumin 2.6 (L) 3.5 - 5.0 g/dL   AST 38 15 - 41 U/L   ALT 16 0 - 44 U/L   Alkaline Phosphatase 60 38 - 126 U/L   Total Bilirubin 1.3 (H) 0.3 - 1.2  mg/dL   GFR calc non Af Amer  >60 >60 mL/min   GFR calc Af Amer >60 >60 mL/min   Anion gap 9 5 - 15    Comment: Performed at [facility] [address], [place], [place] [address]  Prealbumin     Status: Abnormal   Collection Time: 11/06/18  5:41 AM  Result Value Ref Range   Prealbumin 14.7 (L) 18 - 38 mg/dL    Comment: Performed at [facility] [address] [place], [place] [address]  Phosphorus     Status: Abnormal   Collection Time: 11/06/18  5:41 AM  Result Value Ref Range   Phosphorus 2.0 (L) 2.5 - 4.6 mg/dL    Comment: Performed at [facility] [address] [place], [place] [address]  Triglycerides     Status: None   Collection Time: 11/06/18  5:41 AM  Result Value Ref Range   Triglycerides 141 <150 mg/dL    Comment: Performed at [facility] [address] [place], [place] [address]  Phosphorus     Status: Abnormal   Collection Time: 11/06/18  5:42 AM  Result Value Ref Range   Phosphorus 2.0 (L) 2.5 - 4.6 mg/dL    Comment: Performed at [facility] [address] [place],  [address]    Studies/Results: Dg Abd 1 View  Result Date: 11/04/2018 CLINICAL DATA:  Possible colonic diverticulitis. EXAM: ABDOMEN - 1 VIEW COMPARISON:  CT 11/03/2018 FINDINGS: Bowel gas pattern is nonobstructive. No free peritoneal air. Contrast present over the bladder from recent CT scan. Right iliac stent. Degenerative changes spine with fusion hardware at the L2-3 level. IMPRESSION: Nonobstructive bowel gas pattern. Electronically Signed   By: Marin Olp M.D.   On: 11/04/2018 09:22   Mr Brain Wo Contrast  Addendum Date: 11/05/2018   ADDENDUM REPORT: 11/05/2018 20:41 ADDENDUM: Study discussed by telephone with PA Tylene Fantasia on 11/05/2018 at 2037 hours. Electronically Signed   By: Genevie Ann M.D.   On: 11/05/2018 20:41   Result Date: 11/05/2018 CLINICAL DATA:  68 year old male with episodic vertigo. EXAM: MRI HEAD WITHOUT CONTRAST TECHNIQUE: Multiplanar, multiecho pulse sequences of  the brain and surrounding structures were obtained without intravenous contrast. COMPARISON:  CT head without contrast 02/02/2010. FINDINGS: Brain: There is abnormal smooth posterosuperior convexity dural thickening or small subdural hematomas, best demonstrated on FLAIR series 4, image 21. These measure 2-3 millimeters in thickness bilaterally. Trace similar anterior dural thickening or extra-axial blood on series 4, image 18. No intracranial mass effect. No other intracranial blood products identified. No restricted diffusion to suggest acute infarction. No midline shift, mass effect, evidence of mass lesion, ventriculomegaly. Cervicomedullary junction and pituitary are within normal limits. Pearline Cables and white matter signal is within normal limits throughout the brain. No encephalomalacia identified. Vascular: Major intracranial vascular flow voids are preserved although the vertebrobasilar system is diminutive. This appears to beyond the basis of bilateral fetal type PCA origins (normal variant). Skull and upper cervical spine: Negative visible cervical spine. Normal bone marrow signal. Sinuses/Orbits: Postoperative changes to both globes, otherwise negative orbits. Small right maxillary sinus mucous retention cyst. Other paranasal sinuses are clear. Other: Mastoids are clear. Scalp and face soft tissues appear negative. IMPRESSION: 1. Positive for bilateral superior convexity Dural Thickening or less likely Trace Bilateral Subdural Hematomas. Recommend follow-up post-contrast Brain MRI which should confirm. Preliminary differential considerations for smooth dural thickening include dural inflammation, sequelae of lumbar puncture, residual of  prior subdural hematoma, and intracranial hypotension. 2. No other acute intracranial abnormality. And otherwise negative for age noncontrast MRI appearance of the brain. Electronically Signed: By: Genevie Ann M.D. On: 11/05/2018 20:23   Mr Jeri Cos Contrast  Result Date:  11/06/2018 CLINICAL DATA:  68 year old male with vertigo, dizziness. Abnormal appearance of the dura or subdural space over the convexities on the earlier noncontrast MRI. EXAM: MRI HEAD WITH CONTRAST TECHNIQUE: Multiplanar, multiecho pulse sequences of the brain and surrounding structures were obtained with intravenous contrast. CONTRAST:  7.2mL GADAVIST GADOBUTROL 1 MMOL/ML IV SOLN COMPARISON:  Noncontrast MRI 11/05/2018. FINDINGS: These postcontrast images do confirm the presence of generalized pachymeningeal thickening and enhancement (series 2, image 36 and series 3, image 11) responsible for the convexity dural appearance on the earlier noncontrast MRI. There is no evidence of subdural fluid collection or blood. The pachymeningeal thickening appears smooth throughout. The basilar cisterns are also somewhat affected. There is no associated leptomeningeal thickening or enhancement. No associated abnormal parenchymal enhancement, incidental left anterior frontal lobe developmental venous anomaly (normal variant series 2, image 35). The major dural venous sinuses are enhancing and appear to be patent. No abnormal IAC enhancement. Negative visible cervical spine and spinal cord. Visualized bone marrow signal is within normal limits. IMPRESSION: 1. Post-contrast MRI confirms generalized pachymeningeal thickening and enhancement as the dural abnormality on the earlier noncontrast exam. No associated leptomeningeal enhancement. No abnormal parenchymal enhancement. 2. Diffuse smooth dural thickening is nonspecific has a fairly broad differential including due to: Intracranial hypotension, recent lumbar puncture, prior intracranial surgery, prior subdural hematoma, inflammatory pachymeningitis, or less likely infectious meningitis. Electronically Signed   By: Genevie Ann M.D.   On: 11/06/2018 00:31   US Renal  Result Date: 11/04/2018 CLINICAL DATA:  68 year old male with acute kidney injury. EXAM: RENAL / URINARY TRACT  ULTRASOUND COMPLETE COMPARISON:  None. FINDINGS: Right Kidney: Renal measurements: 12.3 x 6.9 x 5.7 cm = volume: 251 mL. Mild cortical thinning noted. Echogenicity within normal limits. No mass or hydronephrosis visualized. Left Kidney: Renal measurements: 12.2 x 6.6 x 5.8 cm = volume: 244 mL. Mild cortical thinning noted. Echogenicity within normal limits. No mass or hydronephrosis visualized. Bladder: Appears normal for degree of bladder distention. IMPRESSION: Mild bilateral cortical thinning without other significant abnormality. Electronically Signed   By: Margarette Canada M.D.   On: 11/04/2018 15:21   [place] Ekg Site Rite  Result Date: 11/05/2018 If Site Rite image not attached, placement could not be confirmed due to current cardiac rhythm.   Medications: I have reviewed the patient's current medications.  Assessment: Acute complicated sigmoid diverticulitis with microperforation and small peridiverticular abscess-doing well on IV Invanz and IV fluid, normal WBC count, normal renal function New onset of vertigo/dizziness-generalized pachymeningeal thickening, diffuse small dural thickening Malnutrition, prealbumin 14.7, 30 pound weight loss in the last few months Low phosphorus   Plan: As per surgical team, if patient's bout of diverticulitis can be treated without surgery at this time, elective single-stage partial colectomy can be planned in the future. Further management as per surgical team, diet advancement as per surgical team. Please recall GI if needed. Patient had tubular adenoma removed in 2015 and is due for colonoscopy this year, which can be planned electively, most likely after surgical resection in the future.   [name] 11/06/2018, 8:56 AM   Pager [phone] If no answer or after 5 PM call [phone]

## 2018-11-06 NOTE — Evaluation (Signed)
Physical Therapy Evaluation/Vestibular Evaluation Patient Details Name: Ray Brown MRN: [id number] DOB: June 08, 1950 Today's Date: 11/06/2018   History of Present Illness  Ray Brown is a 68 yo male with a history of T2DM, PVD, CAD, and recurrent diverticulitis who presented to the ED today with his wife for emesis.  He has had 30lb weight loss in 6 months.  He was found to have persistent diverticulitis with small abscess/fluid consistent with localized perforation.  Clinical Impression  Patient presents with decreased independence with mobility due to decreased strength, decreased balance, decreased activity tolerance with dizziness.  Completed vestibular evaluation and symptoms consistent with L peripheral vestibular hypofunction, and L posterior canal BPPV and ocular tilt abnormality.  Patient able to ambulate in hallway with RW using visual compenstory strategies with min A and cues.  Feel he should be able to go home with wife assist and follow up outpatient vestibular rehab.  PT to follow acutely.    Follow Up Recommendations Outpatient PT([facility] at [facility])    Equipment Recommendations  Rolling walker with 5" wheels(if he can't find his)    Recommendations for Other Services       Precautions / Restrictions Precautions Precautions: Fall      Mobility  Bed Mobility Overal bed mobility: Needs Assistance Bed Mobility: Supine to Sit;Sit to Supine     Supine to sit: Min assist;HOB elevated Sit to supine: Supervision   General bed mobility comments: to steady once up due to LOB with dizziness upon rising  Transfers Overall transfer level: Needs assistance Equipment used: Rolling walker (2 wheeled) Transfers: Sit to/from Stand Sit to Stand: Min guard         General transfer comment: pulled up on walker before I could redirect him, cues for using visual target for visual compensation  Ambulation/Gait Ambulation/Gait assistance: Min guard Gait  Distance (Feet): 100 Feet Assistive device: Rolling walker (2 wheeled) Gait Pattern/deviations: Step-through pattern;Step-to pattern;Decreased stride length     General Gait Details: slow pace with cues throughout for visual compensation to decrease symptoms; reported some wooziness, but able to keep his balance better  Stairs            Wheelchair Mobility    Modified Rankin (Stroke Patients Only)       Balance Overall balance assessment: Needs assistance Sitting-balance support: Feet supported Sitting balance-Leahy Scale: Fair Sitting balance - Comments: after using visual reference able to balance EOB with hands on his knees   Standing balance support: Bilateral upper extremity supported Standing balance-Leahy Scale: Poor Standing balance comment: UE support and A for balance                             Pertinent Vitals/Pain Pain Assessment: No/denies pain    Home Living Family/patient expects to be discharged to:: Private residence Living Arrangements: Spouse/significant other Available Help at Discharge: Family;Available 24 hours/day Type of Home: House Home Access: Stairs to enter Entrance Stairs-Rails: Psychiatric nurse of Steps: 6 Home Layout: One level Home Equipment: Walker - 2 wheels Additional Comments: may still have walker from back surgery    Prior Function Level of Independence: Independent               Hand Dominance   Dominant Hand: Right    Extremity/Trunk Assessment   Upper Extremity Assessment Upper Extremity Assessment: Overall WFL for tasks assessed    Lower Extremity Assessment Lower Extremity Assessment: RLE deficits/detail;LLE deficits/detail RLE Deficits /  Details: AROM WFL, strength hip flexion 2+/5, knee extension 4/5, ankle DF 4+/5 LLE Deficits / Details: AROM WFL, strength hip flexion 3/5, knee extension 4/5, ankle DF 4+/5       Communication   Communication: No difficulties   Cognition Arousal/Alertness: Awake/alert Behavior During Therapy: WFL for tasks assessed/performed Overall Cognitive Status: Within Functional Limits for tasks assessed                                        General Comments General comments (skin integrity, edema, etc.): Wife in the room and education on exercise for adaptation and handouts for education given about BPPV and vestibular neuritis/labyrinthitis  Vestibular Assessment - 11/06/18 0001      Symptom Behavior   Subjective history of current problem  Patient reports symptoms of imbalance and wooziness that started with this hospitalization.  He describes it as spinning at times, but also with objects appearing upside down with certain head/body movements.  Also has been unable to ambulate due to body tremors and feels like passing out.  No prior history of symptoms like these.     Type of Dizziness   Diplopia;Spinning;Unsteady with head/body turns    Frequency of Dizziness  intermittent    Duration of Dizziness  at times seconds    Symptom Nature  Motion provoked;Intermittent;Variable    Aggravating Factors  Activity in general;Turning head quickly;Supine to sit    Relieving Factors  Closing eyes;Head stationary    Progression of Symptoms  No change since onset    History of similar episodes  none      Oculomotor Exam   Oculomotor Alignment  Abnormal   L eye lower in orbit   Ocular ROM  WFL    Spontaneous  Absent    Gaze-induced   Absent    Smooth Pursuits  Intact    Saccades  Intact      Vestibulo-Ocular Reflex   VOR 1 Head Only (x 1 viewing)  performs horizontal x close to 30 sec prior to LOB to R, vertical 30 seconds no symptoms    VOR to Slow Head Movement  Positive left      Auditory   Comments  intact to scratch test and equal bilaterally        Exercises Other Exercises Other Exercises: Seated gaze stabilization with near target 30 sec head turns then rest with cool towel at head, then  vertical; issued handout and tip card for progression   Assessment/Plan    PT Assessment Patient needs continued PT services  PT Problem List Decreased strength;Decreased activity tolerance;Decreased mobility;Decreased balance;Decreased knowledge of use of DME       PT Treatment Interventions DME instruction;Stair training;Therapeutic activities;Balance training;Patient/family education;Therapeutic exercise;Functional mobility training;Gait training    PT Goals (Current goals can be found in the Care Plan section)  Acute Rehab PT Goals Patient Stated Goal: To reduce dizziness/imbalance PT Goal Formulation: With patient/family Time For Goal Achievement: 11/20/18 Potential to Achieve Goals: Good    Frequency Min 3X/week   Barriers to discharge        Co-evaluation               AM-PAC PT "6 Clicks" Mobility  Outcome Measure Help needed turning from your back to your side while in a flat bed without using bedrails?: A Little Help needed moving from lying on your back to sitting on the side  of a flat bed without using bedrails?: A Little Help needed moving to and from a bed to a chair (including a wheelchair)?: A Little Help needed standing up from a chair using your arms (e.g., wheelchair or bedside chair)?: A Little Help needed to walk in hospital room?: A Little Help needed climbing 3-5 steps with a railing? : A Little 6 Click Score: 18    End of Session Equipment Utilized During Treatment: Gait belt Activity Tolerance: Patient tolerated treatment well Patient left: in bed;with call bell/phone within reach;with family/visitor present   PT Visit Diagnosis: Other abnormalities of gait and mobility (R26.89);Dizziness and giddiness (R42);BPPV BPPV - Right/Left : Left    Time: [id number] PT Time Calculation (min) (ACUTE ONLY): 57 min   Charges:   PT Evaluation $PT Eval Moderate Complexity: 1 Mod PT Treatments $Gait Training: 8-22 mins $Therapeutic Exercise: 8-22  mins $Neuromuscular Re-education: 8-22 mins        Magda Kiel, [place] [facility] [phone] 11/06/2018   [name] 11/06/2018, 4:30 PM

## 2018-11-06 NOTE — Progress Notes (Addendum)
Subjective: CC: Patient reports overnight he developed some lower abdominal pain that is worse on the left. No N/V. Passing flatus. No BM. Has not gotten out of bed much.   Objective: Vital signs in last 24 hours: Temp:  [97.7 F (36.5 C)] 97.7 F (36.5 C) (09/15 0538) Pulse Rate:  [60-69] 60 (09/15 0538) Resp:  [13-19] 19 (09/15 1006) BP: (112-143)/(54-70) 143/70 (09/15 1006) SpO2:  [95 %-100 %] 100 % (09/15 0538) Weight:  [80.6 kg] 80.6 kg (09/15 0538) Last BM Date: 11/03/18  Intake/Output from previous day: 09/14 0701 - 09/15 0700 In: 528 [IV Piggyback:528] Out: 1200 [Urine:1200] Intake/Output this shift: Total I/O In: -  Out: 200 [Urine:200]  PE: Gen:  Alert, NAD, pleasant Pulm: Normal rate and effort  Abd: Soft,ND, mild tenderness of the LLQ. +BS. Prior midline abdominal scar.  Ext:  No LE edema  Psych: A&Ox3  Skin: no rashes noted, warm and dry  Lab Results:  Recent Labs    11/05/18 0626 11/06/18 0541  WBC 12.9* 8.6  HGB 13.7 12.6*  HCT 41.4 37.3*  PLT 230 197   BMET Recent Labs    11/05/18 1833 11/06/18 0541  NA 142 142  K 4.5 3.9  CL 108 111  CO2 25 22  GLUCOSE 86 79  BUN 23 17  CREATININE 0.70 0.61  CALCIUM 8.0* 7.6*   PT/INR No results for input(s): LABPROT, INR in the last 72 hours. CMP     Component Value Date/Time   NA 142 11/06/2018 0541   K 3.9 11/06/2018 0541   CL 111 11/06/2018 0541   CO2 22 11/06/2018 0541   GLUCOSE 79 11/06/2018 0541   BUN 17 11/06/2018 0541   CREATININE 0.61 11/06/2018 0541   CALCIUM 7.6 (L) 11/06/2018 0541   PROT 4.5 (L) 11/06/2018 0541   ALBUMIN 2.6 (L) 11/06/2018 0541   AST 38 11/06/2018 0541   ALT 16 11/06/2018 0541   ALKPHOS 60 11/06/2018 0541   BILITOT 1.3 (H) 11/06/2018 0541   GFRNONAA >60 11/06/2018 0541   GFRAA >60 11/06/2018 0541   Lipase     Component Value Date/Time   LIPASE 25 03/24/2017 1857       Studies/Results: Mr Brain Wo Contrast  Addendum Date: 11/05/2018     ADDENDUM REPORT: 11/05/2018 20:41 ADDENDUM: Study discussed by telephone with PA Tylene Fantasia on 11/05/2018 at 2037 hours. Electronically Signed   By: Genevie Ann M.D.   On: 11/05/2018 20:41   Result Date: 11/05/2018 CLINICAL DATA:  68 year old male with episodic vertigo. EXAM: MRI HEAD WITHOUT CONTRAST TECHNIQUE: Multiplanar, multiecho pulse sequences of the brain and surrounding structures were obtained without intravenous contrast. COMPARISON:  CT head without contrast 02/02/2010. FINDINGS: Brain: There is abnormal smooth posterosuperior convexity dural thickening or small subdural hematomas, best demonstrated on FLAIR series 4, image 21. These measure 2-3 millimeters in thickness bilaterally. Trace similar anterior dural thickening or extra-axial blood on series 4, image 18. No intracranial mass effect. No other intracranial blood products identified. No restricted diffusion to suggest acute infarction. No midline shift, mass effect, evidence of mass lesion, ventriculomegaly. Cervicomedullary junction and pituitary are within normal limits. Pearline Cables and white matter signal is within normal limits throughout the brain. No encephalomalacia identified. Vascular: Major intracranial vascular flow voids are preserved although the vertebrobasilar system is diminutive. This appears to beyond the basis of bilateral fetal type PCA origins (normal variant). Skull and upper cervical spine: Negative visible cervical spine. Normal bone marrow  signal. Sinuses/Orbits: Postoperative changes to both globes, otherwise negative orbits. Small right maxillary sinus mucous retention cyst. Other paranasal sinuses are clear. Other: Mastoids are clear. Scalp and face soft tissues appear negative. IMPRESSION: 1. Positive for bilateral superior convexity Dural Thickening or less likely Trace Bilateral Subdural Hematomas. Recommend follow-up post-contrast Brain MRI which should confirm. Preliminary differential considerations for smooth dural  thickening include dural inflammation, sequelae of lumbar puncture, residual of prior subdural hematoma, and intracranial hypotension. 2. No other acute intracranial abnormality. And otherwise negative for age noncontrast MRI appearance of the brain. Electronically Signed: By: Genevie Ann M.D. On: 11/05/2018 20:23   Mr Jeri Cos Contrast  Result Date: 11/06/2018 CLINICAL DATA:  68 year old male with vertigo, dizziness. Abnormal appearance of the dura or subdural space over the convexities on the earlier noncontrast MRI. EXAM: MRI HEAD WITH CONTRAST TECHNIQUE: Multiplanar, multiecho pulse sequences of the brain and surrounding structures were obtained with intravenous contrast. CONTRAST:  7.68mL GADAVIST GADOBUTROL 1 MMOL/ML IV SOLN COMPARISON:  Noncontrast MRI 11/05/2018. FINDINGS: These postcontrast images do confirm the presence of generalized pachymeningeal thickening and enhancement (series 2, image 36 and series 3, image 11) responsible for the convexity dural appearance on the earlier noncontrast MRI. There is no evidence of subdural fluid collection or blood. The pachymeningeal thickening appears smooth throughout. The basilar cisterns are also somewhat affected. There is no associated leptomeningeal thickening or enhancement. No associated abnormal parenchymal enhancement, incidental left anterior frontal lobe developmental venous anomaly (normal variant series 2, image 35). The major dural venous sinuses are enhancing and appear to be patent. No abnormal IAC enhancement. Negative visible cervical spine and spinal cord. Visualized bone marrow signal is within normal limits. IMPRESSION: 1. Post-contrast MRI confirms generalized pachymeningeal thickening and enhancement as the dural abnormality on the earlier noncontrast exam. No associated leptomeningeal enhancement. No abnormal parenchymal enhancement. 2. Diffuse smooth dural thickening is nonspecific has a fairly broad differential including due to:  Intracranial hypotension, recent lumbar puncture, prior intracranial surgery, prior subdural hematoma, inflammatory pachymeningitis, or less likely infectious meningitis. Electronically Signed   By: Genevie Ann M.D.   On: 11/06/2018 00:31   US Renal  Result Date: 11/04/2018 CLINICAL DATA:  68 year old male with acute kidney injury. EXAM: RENAL / URINARY TRACT ULTRASOUND COMPLETE COMPARISON:  None. FINDINGS: Right Kidney: Renal measurements: 12.3 x 6.9 x 5.7 cm = volume: 251 mL. Mild cortical thinning noted. Echogenicity within normal limits. No mass or hydronephrosis visualized. Left Kidney: Renal measurements: 12.2 x 6.6 x 5.8 cm = volume: 244 mL. Mild cortical thinning noted. Echogenicity within normal limits. No mass or hydronephrosis visualized. Bladder: Appears normal for degree of bladder distention. IMPRESSION: Mild bilateral cortical thinning without other significant abnormality. Electronically Signed   By: Margarette Canada M.D.   On: 11/04/2018 15:21   [place] Ekg Site Rite  Result Date: 11/05/2018 If Site Rite image not attached, placement could not be confirmed due to current cardiac rhythm.   Anti-infectives: Anti-infectives (From admission, onward)   Start     Dose/Rate Route Frequency Ordered Stop   11/04/18 0900  ertapenem (INVANZ) 1,000 mg in sodium chloride 0.9 % 100 mL IVPB     1 g 200 mL/hr over 30 Minutes Intravenous Every 24 hours 11/04/18 0834     11/04/18 0100  ceFEPIme (MAXIPIME) 2 g in sodium chloride 0.9 % 100 mL IVPB  Status:  Discontinued     2 g 200 mL/hr over 30 Minutes Intravenous Every 8 hours 11/03/18 1658 11/04/18 0848  11/03/18 1700  metroNIDAZOLE (FLAGYL) IVPB 500 mg  Status:  Discontinued     500 mg 100 mL/hr over 60 Minutes Intravenous Every 8 hours 11/03/18 1623 11/04/18 0848   11/03/18 1630  ceFEPIme (MAXIPIME) 2 g in sodium chloride 0.9 % 100 mL IVPB     2 g 200 mL/hr over 30 Minutes Intravenous  Once 11/03/18 1623 11/03/18 1706   11/03/18 1430   piperacillin-tazobactam (ZOSYN) IVPB 3.375 g     3.375 g 100 mL/hr over 30 Minutes Intravenous  Once 11/03/18 1421 11/03/18 1632       Assessment/Plan Sepsis secondary to diverticulitis - blood cultures pending AKI improving    - Creatinine 1.85>>1.46>>0.92(9/14)>0.61 (9/15) Hx CAD - Plavix/ASA on hold Hypertension Type II diabetes - diet controlled OSA - not on CPAP Hypomagnesemia - Mg 0.5 this AM Malnutrition - Pre-alb 14.7 Possible UTI - 21- 50 WBC/HPF (9/13)- No growth on urine culture COVID negative 11/03/18  Acute on chronic diverticulitis with microperforation             -Continue Invanz. WBC normalized.   - Continue NPO. Likely start CLD tomorrow if continues to do well. Some mild pain this AM.   - TPN  - Hopefully will continue to improve and can be set up to have a single stage partial colectomy in the future.  If he fails antibiotic therapy on the Invanz as he has the other antibiotics since June; he would most likely require a colostomy.  - Needs to mobilize. Is deconditioned with a 30 lb weight loss. PT ordered.   FEN- TPN. NPO. VTE- SCDs, okay for chemical prophylaxis from our standpoint. ID:  Maxipime 9/12  X 2 dose;  invanx 9/13 >> day 3 Follow up:  TBD   LOS: 3 days   Jillyn Ledger , [facility] Surgery 11/06/2018, 10:30 AM Pager: [phone]  Agree with above. Because of the recurrence of the diverticulitis, would consider home IV antibiotics with Colbert Ewing, if this works.  Alphonsa Overall, MD, [facility] Surgery Pager: [phone] Office phone:  [phone]

## 2018-11-06 NOTE — Progress Notes (Signed)
PROGRESS NOTE    Ray Brown  SAY:[id number] DOB: 1950-03-15 DOA: 11/03/2018 PCP: [name], MD  Outpatient Specialists:   Brief Narrative:  Ray Brown is a 68 y.o. male with medical history significant of OSA not on CPAP; PVD; HLD; DM; CAD; diverticulosis; and chronic back pain.  Patient presented with nausea and vomiting.  Found to have severe diverticulitis with microperforation and small diverticular abscess.  Reported 30 pound weight loss.  Apparently, patient has had recurrent diverticulitis, followed by [facility] since June, several rounds of abx, most recently in early August.  Patient was admitted by hospitalist service.  Started on antibiotics which were switched to [place] on 11/04/2018.  GI was consulted and surgery was consulted as well.  Surgery has discussed possible surgical intervention either during this hospitalization or in future as outpatient based on how he responds to IV antibiotics.  Patient then developed some vertigo.  Had CT head and then MRI of the brain without contrast followed by MRI of the brain with contrast which showed 1. Post-contrast MRI confirms generalized pachymeningeal thickening and enhancement as the dural abnormality on the earlier noncontrast exam. No associated leptomeningeal enhancement. No abnormal parenchymal enhancement.  2. Diffuse smooth dural thickening is nonspecific has a fairly broad differential including due to: Intracranial hypotension, recent lumbar puncture, prior intracranial surgery, prior subdural hematoma, inflammatory pachymeningitis, or less likely infectious meningitis.  Assessment & Plan:   Principal Problem:   Diverticulitis of colon with perforation Active Problems:   Diabetes mellitus with peripheral artery disease (HCC)   Coronary artery disease involving native coronary artery of native heart without angina pectoris   Dyslipidemia   OSA (obstructive sleep apnea)  Sepsis secondary to diverticulitis  with microperforation and abscess    he has severe sigmoid diverticulosis with wall thickening and adjacent inflammatory fat stranding with extraluminal air on repeated imaging on 6/9, 7/8, 8/10, and 11/03/2018.  He met sepsis criteria based on leukocytosis, tachycardia and tachypnea.  Sepsis has resolved at this point in time.  Blood culture negative so far. -Patient's abdominal pain is under control with the pain medications.  He does not have any nausea or vomiting.  Remains n.p.o.  Continue Invanz and defer further management to GI and general surgery.   Acute kidney injury: -Serum creatinine had peaked to 1.85.  This was likely prerenal.  This has resolved.  Renal ultrasound unremarkable.  Severe hypomagnesemia: Resolved.  Mild hypophosphatemia: Replete with IV potassium phosphate today again.  Vertigo: This is started approximately 2 days ago.  He ended up having CT head followed by MRI brain without contrast and now MRI brain with contrast with impression as below 1. Post-contrast MRI confirms generalized pachymeningeal thickening and enhancement as the dural abnormality on the earlier noncontrast exam. No associated leptomeningeal enhancement. No abnormal parenchymal enhancement.  2. Diffuse smooth dural thickening is nonspecific has a fairly broad differential including due to: Intracranial hypotension, recent lumbar puncture, prior intracranial surgery, prior subdural hematoma, inflammatory pachymeningitis, or less likely infectious Meningitis. Patient does seem to have a very minimal horizontal nystagmus.  Wondering if this is BPPV.  I have consulted PT to do vestibular maneuvers with him.  If he does not improve with them then we will consult neurology.  H/o CAD -Hold ASA and Plavix for now for possible need for surgery.  No signs of ACS.  HTN -Blood pressure very well controlled.  On IV Lopressor as needed.  DM -Diet controlled.  Last hemoglobin A1c 6.5.  Since  he  is n.p.o. so I will start him on SSI.  HLD -Continue Lipitor -Hold Niaspan and Fenofibrate as these are unlikely to have significant utility during hospitalization  OSA -Does not wear CPAP  Note: This patient has been tested and is negative for the novel coronavirus COVID-19.  DVT prophylaxis:SCDs Code Status:Full - confirmed with patient/family Family Communication:No family present at bedside.  Plan of care discussed with the patient and he verbalized understanding. Disposition Plan:Home once clinically improved Consults called:GI; surgery  Procedures:   None  Antimicrobials:   IV ertapenem   Subjective: Patient seen and examined.  No more abdominal pain, nausea or vomiting.  Complains of vertigo.  Objective: Vitals:   11/05/18 1004 11/05/18 1117 11/05/18 2201 11/06/18 0538  BP: 108/60 (!) 112/54 (!) 113/58 126/70  Pulse:  69 64 60  Resp:   13 18  Temp:   97.7 F (36.5 C) 97.7 F (36.5 C)  TempSrc:   Oral Oral  SpO2:   95% 100%  Weight:    80.6 kg  Height:        Intake/Output Summary (Last 24 hours) at 11/06/2018 0927 Last data filed at 11/06/2018 0557 Gross per 24 hour  Intake 528.03 ml  Output 1200 ml  Net -671.97 ml   Filed Weights   11/04/18 0503 11/05/18 0500 11/06/18 0538  Weight: 77.9 kg 79.4 kg 80.6 kg    Examination:  General exam: Appears calm and comfortable  Respiratory system: Clear to auscultation. Respiratory effort normal. Cardiovascular system: S1 & S2 heard, RRR. No JVD, murmurs, rubs, gallops or clicks. No pedal edema. Gastrointestinal system: Abdomen is nondistended, soft and tender at suprapubic, left lower quadrant and right lower quadrant. No organomegaly or masses felt. Normal bowel sounds heard. Central nervous system: Alert and oriented. No focal neurological deficits. Extremities: Symmetric 5 x 5 power. Skin: No rashes, lesions or ulcers.  Psychiatry: Judgement and insight appear normal. Mood & affect  appropriate.     Data Reviewed: I have personally reviewed following labs and imaging studies  CBC: Recent Labs  Lab 11/03/18 0648 11/04/18 0355 11/05/18 0626 11/06/18 0541  WBC 11.1* 21.8* 12.9* 8.6  NEUTROABS  --   --   --  6.2  HGB 16.7 16.9 13.7 12.6*  HCT 48.6 48.0 41.4 37.3*  MCV 92.7 91.3 96.3 97.4  PLT 293 295 230 671   Basic Metabolic Panel: Recent Labs  Lab 11/04/18 0355 11/04/18 1123 11/05/18 0626 11/05/18 1833 11/06/18 0541 11/06/18 0542  NA 143 144 145 142 142  --   K 3.2* 3.3* 4.3 4.5 3.9  --   CL 100 102 110 108 111  --   CO2 _0 --   GLUCOSE 149* 138* 98 86 79  --   BUN 21 24* 27* 23 17  --   CREATININE 1.85* 1.46* 0.92 0.70 0.61  --   CALCIUM 9.4 9.0 8.0* 8.0* 7.6*  --   MG  --   --  0.5* 1.4* 1.8  --   PHOS  --  3.2 2.3* 2.9 2.0* 2.0*   GFR: Estimated Creatinine Clearance: 84.3 mL/min (by C-G formula based on SCr of 0.61 mg/dL). Liver Function Tests: Recent Labs  Lab 11/03/18 0648 11/04/18 1123 11/05/18 0626 11/05/18 1833 11/06/18 0541  AST 21  --   --   --  38  ALT 15  --   --   --  16  ALKPHOS 84  --   --   --  60  BILITOT 1.1  --   --   --  1.3*  PROT 7.0  --   --   --  4.5*  ALBUMIN 3.9 3.4* 2.8* 2.8* 2.6*   No results for input(s): LIPASE, AMYLASE in the last 168 hours. No results for input(s): AMMONIA in the last 168 hours. Coagulation Profile: No results for input(s): INR, PROTIME in the last 168 hours. Cardiac Enzymes: No results for input(s): CKTOTAL, CKMB, CKMBINDEX, TROPONINI in the last 168 hours. BNP (last 3 results) No results for input(s): PROBNP in the last 8760 hours. HbA1C: No results for input(s): HGBA1C in the last 72 hours. CBG: No results for input(s): GLUCAP in the last 168 hours. Lipid Profile: Recent Labs    11/06/18 0541  TRIG 141   Thyroid Function Tests: No results for input(s): TSH, T4TOTAL, FREET4, T3FREE, THYROIDAB in the last 72 hours. Anemia Panel: No results for input(s):  VITAMINB12, FOLATE, FERRITIN, TIBC, IRON, RETICCTPCT in the last 72 hours. Urine analysis:    Component Value Date/Time   COLORURINE AMBER (A) 11/04/2018 1821   APPEARANCEUR HAZY (A) 11/04/2018 1821   LABSPEC >1.046 (H) 11/04/2018 1821   PHURINE 5.0 11/04/2018 1821   GLUCOSEU 50 (A) 11/04/2018 1821   HGBUR NEGATIVE 11/04/2018 1821   BILIRUBINUR NEGATIVE 11/04/2018 1821   KETONESUR 5 (A) 11/04/2018 1821   PROTEINUR 100 (A) 11/04/2018 1821   UROBILINOGEN 0.2 01/22/2010 1943   NITRITE NEGATIVE 11/04/2018 1821   LEUKOCYTESUR NEGATIVE 11/04/2018 1821   Sepsis Labs: _0 (procalcitonin:4,lacticidven:4)  ) Recent Results (from the past 240 hour(s))  SARS Coronavirus 2 [facility] order, Performed in [facility] hospital lab) Nasopharyngeal Nasopharyngeal Swab     Status: None   Collection Time: 11/03/18  3:36 PM   Specimen: Nasopharyngeal Swab  Result Value Ref Range Status   SARS Coronavirus 2 NEGATIVE NEGATIVE Final    Comment: (NOTE) If result is NEGATIVE SARS-CoV-2 target nucleic acids are NOT DETECTED. The SARS-CoV-2 RNA is generally detectable in upper and lower  respiratory specimens during the acute phase of infection. The lowest  concentration of SARS-CoV-2 viral copies this assay can detect is 250  copies / mL. A negative result does not preclude SARS-CoV-2 infection  and should not be used as the sole basis for treatment or other  patient management decisions.  A negative result may occur with  improper specimen collection / handling, submission of specimen other  than nasopharyngeal swab, presence of viral mutation(s) within the  areas targeted by this assay, and inadequate number of viral copies  (<250 copies / mL). A negative result must be combined with clinical  observations, patient history, and epidemiological information. If result is POSITIVE SARS-CoV-2 target nucleic acids are DETECTED. The SARS-CoV-2 RNA is generally detectable in upper and lower    respiratory specimens dur ing the acute phase of infection.  Positive  results are indicative of active infection with SARS-CoV-2.  Clinical  correlation with patient history and other diagnostic information is  necessary to determine patient infection status.  Positive results do  not rule out bacterial infection or co-infection with other viruses. If result is PRESUMPTIVE POSTIVE SARS-CoV-2 nucleic acids MAY BE PRESENT.   A presumptive positive result was obtained on the submitted specimen  and confirmed on repeat testing.  While 2019 novel coronavirus  (SARS-CoV-2) nucleic acids may be present in the submitted sample  additional confirmatory testing may be necessary for epidemiological  and / or clinical management purposes  to differentiate between  SARS-CoV-2  and other Sarbecovirus currently known to infect humans.  If clinically indicated additional testing with an alternate test  methodology [phone]) is advised. The SARS-CoV-2 RNA is generally  detectable in upper and lower respiratory sp ecimens during the acute  phase of infection. The expected result is Negative. Fact Sheet for Patients:  StrictlyIdeas.no Fact Sheet for Healthcare Providers: BankingDealers.co.za This test is not yet approved or cleared by the [place] FDA and has been authorized for detection and/or diagnosis of SARS-CoV-2 by FDA under an Emergency Use Authorization (EUA).  This EUA will remain in effect (meaning this test can be used) for the duration of the COVID-19 declaration under Section 564(b)(1) of the Act, 21 U.S.C. section 360bbb-3(b)(1), unless the authorization is terminated or revoked sooner. Performed at [facility], [place] [address] [place], [place] [address]   Culture, blood (routine x 2)     Status: None (Preliminary result)   Collection Time: 11/03/18  5:58 PM   Specimen: BLOOD  Result Value Ref Range Status   Specimen  Description BLOOD LEFT ANTECUBITAL  Final   Special Requests   Final    BOTTLES DRAWN AEROBIC AND ANAEROBIC Blood Culture adequate volume   Culture   Final    NO GROWTH 2 DAYS Performed at [facility], [place] [address], [place], [place] [address]    Report Status PENDING  Incomplete  Culture, blood (routine x 2)     Status: None (Preliminary result)   Collection Time: 11/03/18  6:40 PM   Specimen: BLOOD LEFT HAND  Result Value Ref Range Status   Specimen Description BLOOD LEFT HAND  Final   Special Requests   Final    BOTTLES DRAWN AEROBIC AND ANAEROBIC Blood Culture results may not be optimal due to an inadequate volume of blood received in culture bottles   Culture   Final    NO GROWTH 2 DAYS Performed at [facility], [place] [address], [place], [place] [address]    Report Status PENDING  Incomplete  Culture, Urine     Status: None   Collection Time: 11/04/18  6:57 AM   Specimen: Urine, Random  Result Value Ref Range Status   Specimen Description URINE, RANDOM  Final   Special Requests NONE  Final   Culture   Final    NO GROWTH Performed at [facility], [place] [address] [place], [place] [address]    Report Status 11/05/2018 FINAL  Final         Radiology Studies: Mr Brain Wo Contrast  Addendum Date: 11/05/2018   ADDENDUM REPORT: 11/05/2018 20:41 ADDENDUM: Study discussed by telephone with PA Tylene Fantasia on 11/05/2018 at 2037 hours. Electronically Signed   By: Genevie Ann M.D.   On: 11/05/2018 20:41   Result Date: 11/05/2018 CLINICAL DATA:  68 year old male with episodic vertigo. EXAM: MRI HEAD WITHOUT CONTRAST TECHNIQUE: Multiplanar, multiecho pulse sequences of the brain and surrounding structures were obtained without intravenous contrast. COMPARISON:  CT head without contrast 02/02/2010. FINDINGS: Brain: There is abnormal smooth posterosuperior convexity dural thickening or small subdural hematomas, best demonstrated on FLAIR series 4, image 21. These measure 2-3  millimeters in thickness bilaterally. Trace similar anterior dural thickening or extra-axial blood on series 4, image 18. No intracranial mass effect. No other intracranial blood products identified. No restricted diffusion to suggest acute infarction. No midline shift, mass effect, evidence of mass lesion, ventriculomegaly. Cervicomedullary junction and pituitary are within normal limits. Pearline Cables and white matter  signal is within normal limits throughout the brain. No encephalomalacia identified. Vascular: Major intracranial vascular flow voids are preserved although the vertebrobasilar system is diminutive. This appears to beyond the basis of bilateral fetal type PCA origins (normal variant). Skull and upper cervical spine: Negative visible cervical spine. Normal bone marrow signal. Sinuses/Orbits: Postoperative changes to both globes, otherwise negative orbits. Small right maxillary sinus mucous retention cyst. Other paranasal sinuses are clear. Other: Mastoids are clear. Scalp and face soft tissues appear negative. IMPRESSION: 1. Positive for bilateral superior convexity Dural Thickening or less likely Trace Bilateral Subdural Hematomas. Recommend follow-up post-contrast Brain MRI which should confirm. Preliminary differential considerations for smooth dural thickening include dural inflammation, sequelae of lumbar puncture, residual of prior subdural hematoma, and intracranial hypotension. 2. No other acute intracranial abnormality. And otherwise negative for age noncontrast MRI appearance of the brain. Electronically Signed: By: Genevie Ann M.D. On: 11/05/2018 20:23   Mr Jeri Cos Contrast  Result Date: 11/06/2018 CLINICAL DATA:  68 year old male with vertigo, dizziness. Abnormal appearance of the dura or subdural space over the convexities on the earlier noncontrast MRI. EXAM: MRI HEAD WITH CONTRAST TECHNIQUE: Multiplanar, multiecho pulse sequences of the brain and surrounding structures were obtained with  intravenous contrast. CONTRAST:  7.71m GADAVIST GADOBUTROL 1 MMOL/ML IV SOLN COMPARISON:  Noncontrast MRI 11/05/2018. FINDINGS: These postcontrast images do confirm the presence of generalized pachymeningeal thickening and enhancement (series 2, image 36 and series 3, image 11) responsible for the convexity dural appearance on the earlier noncontrast MRI. There is no evidence of subdural fluid collection or blood. The pachymeningeal thickening appears smooth throughout. The basilar cisterns are also somewhat affected. There is no associated leptomeningeal thickening or enhancement. No associated abnormal parenchymal enhancement, incidental left anterior frontal lobe developmental venous anomaly (normal variant series 2, image 35). The major dural venous sinuses are enhancing and appear to be patent. No abnormal IAC enhancement. Negative visible cervical spine and spinal cord. Visualized bone marrow signal is within normal limits. IMPRESSION: 1. Post-contrast MRI confirms generalized pachymeningeal thickening and enhancement as the dural abnormality on the earlier noncontrast exam. No associated leptomeningeal enhancement. No abnormal parenchymal enhancement. 2. Diffuse smooth dural thickening is nonspecific has a fairly broad differential including due to: Intracranial hypotension, recent lumbar puncture, prior intracranial surgery, prior subdural hematoma, inflammatory pachymeningitis, or less likely infectious meningitis. Electronically Signed   By: HGenevie AnnM.D.   On: 11/06/2018 00:31   [place]  Result Date: 11/04/2018 CLINICAL DATA:  68year old male with acute kidney injury. EXAM: RENAL / URINARY TRACT ULTRASOUND COMPLETE COMPARISON:  None. FINDINGS: Right Kidney: Renal measurements: 12.3 x 6.9 x 5.7 cm = volume: 251 mL. Mild cortical thinning noted. Echogenicity within normal limits. No mass or hydronephrosis visualized. Left Kidney: Renal measurements: 12.2 x 6.6 x 5.8 cm = volume: 244 mL. Mild cortical  thinning noted. Echogenicity within normal limits. No mass or hydronephrosis visualized. Bladder: Appears normal for degree of bladder distention. IMPRESSION: Mild bilateral cortical thinning without other significant abnormality. Electronically Signed   By: JMargarette CanadaM.D.   On: 11/04/2018 15:21   [place]kg Site Rite  Result Date: 11/05/2018 If Site Rite image not attached, placement could not be confirmed due to current cardiac rhythm.       Scheduled Meds:  aspirin EC  81 mg Oral Daily   atorvastatin  80 mg Oral QHS   Chlorhexidine Gluconate Cloth  6 each Topical Daily   metoprolol succinate  25 mg Oral Daily  sodium chloride flush  10-40 mL Intracatheter Q12H   sodium chloride flush  3 mL Intravenous Q12H   Continuous Infusions:  sodium chloride 75 mL/hr at 11/05/18 1124   ertapenem 1,000 mg (11/05/18 1129)     LOS: 3 days    Time spent: 34 minutes  Ishmael Holter, MD  [facility] Pager #: [phone] 7PM-7AM contact night coverage as above

## 2018-11-06 NOTE — Progress Notes (Signed)
[place] NOTE   Pharmacy Consult for TPN Indication: Diverticulitis with microperforation  Patient Measurements: Height: 5\' 8"  (172.7 cm) Weight: 177 lb 11.1 oz (80.6 kg) IBW/kg (Calculated) : 68.4 TPN AdjBW (KG): 81.6 Body mass index is 27.02 kg/m.   Assessment:  Acute on chronic diverticulitis with perforation. Has been NPO, significant weight loss. Patient is malnourished. Attempting to manage conservatively and avoid surgery.  GI: Prealbumin 14.7, 30 lb weight loss in the past few months Endo: cbgs 70-160 Insulin requirements in the past 24 hours: 0 units Lytes: K 3.9, CoCa 8.7, Mg 1.8, other lytes ok  Renal: SCr 0.6 Pulm: RA Cards: VSS Hepatobil: LFTs wnl, Tbili 1.3 Neuro: A&O ID: No active issues   TPN Access: PICC TPN start date: 9/15 Nutritional Goals (per RD recommendation on **): Awaiting recs kCal: Protein:  Fluid:   Current Nutrition:  NPO  Plan:  -Initiate TPN at 40 ml/hr -This TPN provides 52 g of protein, 144 g of dextrose, and 21 g of lipids which provides 912 kCals per day, meeting ~50 % of patient needs -Electrolytes in TPN: Increase Mg and Phos, others to standard, [id number] 1:1 -Add MVI, trace elements to TPN -Reduce NS to 35 ml/hr at [address] 11/06/2018,12:10 PM

## 2018-11-06 NOTE — Care Management Important Message (Signed)
Important Message  Patient Details  Name: Ray Brown MRN: [id number] Date of Birth: January 28, 1951   Medicare Important Message Given:  Yes     Shelda Altes 11/06/2018, 11:19 AM

## 2018-11-07 LAB — CBC WITH DIFFERENTIAL/PLATELET
Abs Immature Granulocytes: 0.03 10*3/uL (ref 0.00–0.07)
Basophils Absolute: 0 10*3/uL (ref 0.0–0.1)
Basophils Relative: 0 %
Eosinophils Absolute: 0.3 10*3/uL (ref 0.0–0.5)
Eosinophils Relative: 3 %
HCT: 38.6 % — ABNORMAL LOW (ref 39.0–52.0)
Hemoglobin: 12.9 g/dL — ABNORMAL LOW (ref 13.0–17.0)
Immature Granulocytes: 0 %
Lymphocytes Relative: 16 %
Lymphs Abs: 1.3 10*3/uL (ref 0.7–4.0)
MCH: 32.7 pg (ref 26.0–34.0)
MCHC: 33.4 g/dL (ref 30.0–36.0)
MCV: 98 fL (ref 80.0–100.0)
Monocytes Absolute: 0.6 10*3/uL (ref 0.1–1.0)
Monocytes Relative: 7 %
Neutro Abs: 5.6 10*3/uL (ref 1.7–7.7)
Neutrophils Relative %: 74 %
Platelets: 206 10*3/uL (ref 150–400)
RBC: 3.94 MIL/uL — ABNORMAL LOW (ref 4.22–5.81)
RDW: 12.7 % (ref 11.5–15.5)
WBC: 7.8 10*3/uL (ref 4.0–10.5)
nRBC: 0 % (ref 0.0–0.2)

## 2018-11-07 LAB — COMPREHENSIVE METABOLIC PANEL
ALT: 13 U/L (ref 0–44)
ALT: 15 U/L (ref 0–44)
AST: 29 U/L (ref 15–41)
AST: 26 U/L (ref 15–41)
Albumin: 2.5 g/dL — ABNORMAL LOW (ref 3.5–5.0)
Albumin: 2.7 g/dL — ABNORMAL LOW (ref 3.5–5.0)
Alkaline Phosphatase: 59 U/L (ref 38–126)
Alkaline Phosphatase: 65 U/L (ref 38–126)
Anion gap: 6 (ref 5–15)
Anion gap: 4 — ABNORMAL LOW (ref 5–15)
BUN: 11 mg/dL (ref 8–23)
BUN: 10 mg/dL (ref 8–23)
CO2: 22 mmol/L (ref 22–32)
CO2: 24 mmol/L (ref 22–32)
Calcium: 8.1 mg/dL — ABNORMAL LOW (ref 8.9–10.3)
Calcium: 8.5 mg/dL — ABNORMAL LOW (ref 8.9–10.3)
Chloride: 104 mmol/L (ref 98–111)
Chloride: 110 mmol/L (ref 98–111)
Creatinine, Ser: 0.58 mg/dL — ABNORMAL LOW (ref 0.61–1.24)
Creatinine, Ser: 0.58 mg/dL — ABNORMAL LOW (ref 0.61–1.24)
GFR calc Af Amer: >60 mL/min (ref >60)
GFR calc Af Amer: >60 mL/min (ref >60)
GFR calc non Af Amer: >60 mL/min (ref >60)
GFR calc non Af Amer: >60 mL/min (ref >60)
Glucose, Bld: 492 mg/dL — ABNORMAL HIGH (ref 70–99)
Glucose, Bld: 137 mg/dL — ABNORMAL HIGH (ref 70–99)
Potassium: 6.1 mmol/L — ABNORMAL HIGH (ref 3.5–5.1)
Potassium: 4.1 mmol/L (ref 3.5–5.1)
Sodium: 132 mmol/L — ABNORMAL LOW (ref 135–145)
Sodium: 138 mmol/L (ref 135–145)
Total Bilirubin: 1.2 mg/dL (ref 0.3–1.2)
Total Bilirubin: 0.7 mg/dL (ref 0.3–1.2)
Total Protein: 4.3 g/dL — ABNORMAL LOW (ref 6.5–8.1)
Total Protein: 5.2 g/dL — ABNORMAL LOW (ref 6.5–8.1)

## 2018-11-07 LAB — PHOSPHORUS
Phosphorus: 4.2 mg/dL (ref 2.5–4.6)
Phosphorus: 2.1 mg/dL — ABNORMAL LOW (ref 2.5–4.6)

## 2018-11-07 LAB — GLUCOSE, CAPILLARY
Glucose-Capillary: 110 mg/dL — ABNORMAL HIGH (ref 70–99)
Glucose-Capillary: 114 mg/dL — ABNORMAL HIGH (ref 70–99)
Glucose-Capillary: 135 mg/dL — ABNORMAL HIGH (ref 70–99)
Glucose-Capillary: 145 mg/dL — ABNORMAL HIGH (ref 70–99)
Glucose-Capillary: 438 mg/dL — ABNORMAL HIGH (ref 70–99)
Glucose-Capillary: 91 mg/dL (ref 70–99)
Glucose-Capillary: 97 mg/dL (ref 70–99)

## 2018-11-07 LAB — MAGNESIUM
Magnesium: 2.2 mg/dL (ref 1.7–2.4)
Magnesium: 1.6 mg/dL — ABNORMAL LOW (ref 1.7–2.4)

## 2018-11-07 LAB — HEMOGLOBIN A1C
Hgb A1c MFr Bld: 6.7 % — ABNORMAL HIGH (ref 4.8–5.6)
Mean Plasma Glucose: 146 mg/dL

## 2018-11-07 MED ORDER — SODIUM PHOSPHATES 45 MMOLE/15ML IV SOLN
20.0000 mmol | Freq: Once | INTRAVENOUS | Status: AC
  Administered 2018-11-07: 20 mmol via INTRAVENOUS
  Filled 2018-11-07: qty 6.67

## 2018-11-07 MED ORDER — MAGNESIUM SULFATE 2 GM/50ML IV SOLN
2.0000 g | Freq: Once | INTRAVENOUS | Status: AC
  Administered 2018-11-07: 12:00:00 2 g via INTRAVENOUS
  Filled 2018-11-07: qty 50

## 2018-11-07 MED ORDER — INFLUENZA VAC A&B SA ADJ QUAD 0.5 ML IM PRSY: 0.5000 mL | PREFILLED_SYRINGE | INTRAMUSCULAR | Status: DC

## 2018-11-07 MED ORDER — TRAVASOL 10 % IV SOLN
INTRAVENOUS | Status: AC
  Administered 2018-11-07: 18:00:00 via INTRAVENOUS
  Filled 2018-11-07: qty 528

## 2018-11-07 NOTE — Progress Notes (Signed)
CBG of 438 not accurate, blood taken from PICC line. Got finger stick and got 145 instead.

## 2018-11-07 NOTE — Progress Notes (Signed)
Initial Nutrition Assessment  INTERVENTION:   Monitor magnesium, potassium, and phosphorus daily for at least 3 days, MD to replete as needed, as pt is at risk for refeeding syndrome given poor PO related to N/V and weight loss.  -TPN per Pharmacy -Will monitor for further diet advancement  NUTRITION DIAGNOSIS:   Inadequate oral intake related to nausea, vomiting(recurrent diverticulitis) as evidenced by per patient/family report.  GOAL:   Patient will meet greater than or equal to 90% of their needs  MONITOR:   PO intake, Labs, Weight trends, I & O's(TPN)  REASON FOR ASSESSMENT:   Consult Assessment of nutrition requirement/status, New TPN/TNA  ASSESSMENT:   68 y.o. male with medical history significant of OSA not on CPAP; PVD; HLD; DM; CAD; diverticulosis; and chronic back pain.  Patient presented with nausea and vomiting.  Found to have severe diverticulitis with microperforation and small diverticular abscess.  Reported 30 pound weight loss.  9/12: admitted, NPO 9/15: TPN initiated  **RD working [employer]Country[employer] advanced to clears today. Pt was having N/V PTA. Per chart review, pt has been treating recurrent diverticulitis flares since June of this year. Surgery has been consulted for possible surgery. TPN to continue at 40 ml/hr tonight (providing 912 kcal and 52g protein).  Patient reports 30 lb weight loss since January 2020. This would be a 14% wt loss over 9 months which is insignificant for time frame.  Last recorded weight in records was 205 lbs in August 2019. I/Os:+840 ml since admit UOP 9/25: 1200 ml  Medications: IV Mg sulfate once  Labs reviewed: CBGs: 135-145 Low Phos/Mg  NUTRITION - FOCUSED PHYSICAL EXAM:  Unable to perform- working remotely.  Diet Order:   Diet Order            Diet clear liquid Room service appropriate? Yes; Fluid consistency: Thin  Diet effective now              EDUCATION NEEDS:   No education needs have  been identified at this time  Skin:  Skin Assessment: Reviewed RN Assessment  Last BM:  9/11  Height:   Ht Readings from Last 1 Encounters:  11/03/18 5\' 8"  (1.727 m)    Weight:   Wt Readings from Last 1 Encounters:  11/07/18 80.3 kg    Ideal Body Weight:  70 kg  BMI:  Body mass index is 26.92 kg/m.  Estimated Nutritional Needs:   Kcal:  2000-2200  Protein:  95-105g  Fluid:  2L/day  Clayton Bibles, MS, RD, LDN Inpatient Clinical Dietitian Pager: [phone] After Hours Pager: [phone]

## 2018-11-07 NOTE — Progress Notes (Signed)
[facility] NOTE   Pharmacy Consult for TPN Indication: Diverticulitis with microperforation  Patient Measurements: Height: 5\' 8"  (172.7 cm) Weight: 177 lb 0.5 oz (80.3 kg) IBW/kg (Calculated) : 68.4 TPN AdjBW (KG): 81.6 Body mass index is 26.92 kg/m.   Assessment:  Acute on chronic diverticulitis with perforation. Has been NPO, significant weight loss. Patient is malnourished. Attempting to manage conservatively and avoid surgery.  GI: Prealbumin 14.7,  ~14% weight loss over 9 months.  LBM 9/11. Plan to start clear liquids tomorrow if continues to do well.  Endo: cbgs 92-145 (492 was error lab due to TPN contamination) Insulin requirements in the past 24 hours: 1 units Lytes: Initial labs contaminated with TPN. K 4.1. Phos low at 2.1 and Mg low at 1.6 - will replace today.  Renal: SCr 0.6 >> 0.58, BUN 10. UOP 0.6 mL/kg/hr. Net +766mL. Pulm: RA Cards: SB in 50-60s, BP stable Hepatobil: LFTs/Tbili/TG wnl Neuro: A&O ID: Acute on chronic diverticulitis with microperforation on Ertapenem per surgery.   TPN Access: PICC TPN start date: 9/15 Nutritional Goals (per RD recommendation on 9/15): KCal: 2000-2200 Protein: 95-100g Fluid: 2L/day   Current Nutrition:  NPO  Plan:  -Continue TPN at 40 ml/hr until lytes improved -This TPN provides 52 g of protein, 144 g of dextrose, and 21 g of lipids which provides 912 kCals per day, meeting ~50 % of patient needs -Electrolytes in TPN: Increase Mg and Phos, others to standard, [id number] 1:1 -Add MVI, trace elements to TPN -Continue Sensitive SSI q4h for now -Reduce NS to 35 ml/hr while on TPN at 40 mL/hr.  -Follow-up ability to tolerate enteral diet and wean TPN.   Give NaPhos 20 mmol IV x1 today Give Magnesium 2g IV x1 today  Sloan Leiter, PharmD, BCPS, BCCCP Clinical Pharmacist Clinical phone 11/07/2018 until 3:30PM [phone] Please refer to [facility] for Rich  numbers 11/07/2018,7:23 AM

## 2018-11-07 NOTE — Progress Notes (Addendum)
CC: Abdominal pain  Subjective: No pain this AM, still dizzy up walking, PT working with him. No BM  Objective: Vital signs in last 24 hours: Temp:  [97.8 F (36.6 C)-98.4 F (36.9 C)] 97.8 F (36.6 C) (09/16 0519) Pulse Rate:  [51-63] 58 (09/16 0846) Resp:  [19-23] 19 (09/16 0519) BP: (124-143)/(54-74) 132/74 (09/16 0846) SpO2:  [96 %-100 %] 96 % (09/16 0519) Weight:  [80.3 kg] 80.3 kg (09/16 0519) Last BM Date: 11/02/18 50 p.o. recorded 1000 IV 1200 urine Afebrile vital signs are stable No labs today currently CMP is pending Intake/Output from previous day: 09/15 0701 - 09/16 0700 In: 1085.5 [P.O.:50; I.V.:828.9; IV Piggyback:206.6] Out: 1200 [Urine:1200] Intake/Output this shift: Total I/O In: -  Out: 700 [Urine:700]  General appearance: alert, cooperative and no distress Resp: clear to auscultation bilaterally GI: soft, non-tender; bowel sounds normal; no masses,  no organomegaly  Lab Results:  Recent Labs    11/06/18 0541 11/07/18 0500  WBC 8.6 7.8  HGB 12.6* 12.9*  HCT 37.3* 38.6*  PLT 197 206    BMET Recent Labs    11/06/18 0541 11/07/18 0500  NA 142 132*  K 3.9 6.1*  CL 111 104  CO2 22 22  GLUCOSE 79 492*  BUN 17 11  CREATININE 0.61 0.58*  CALCIUM 7.6* 8.1*   PT/INR No results for input(s): LABPROT, INR in the last 72 hours.  Recent Labs  Lab 11/03/18 0648 11/04/18 1123 11/05/18 0626 11/05/18 1833 11/06/18 0541 11/07/18 0500  AST 21  --   --   --  38 29  ALT 15  --   --   --  16 13  ALKPHOS 84  --   --   --  60 59  BILITOT 1.1  --   --   --  1.3* 1.2  PROT 7.0  --   --   --  4.5* 4.3*  ALBUMIN 3.9 3.4* 2.8* 2.8* 2.6* 2.5*     Lipase     Component Value Date/Time   LIPASE 25 03/24/2017 1857     Medications: . aspirin EC  81 mg Oral Daily  . atorvastatin  80 mg Oral QHS  . Chlorhexidine Gluconate Cloth  6 each Topical Daily  . insulin aspart  0-9 Units Subcutaneous Q4H  . metoprolol succinate  25 mg Oral Daily  .  sodium chloride flush  10-40 mL Intracatheter Q12H  . sodium chloride flush  3 mL Intravenous Q12H   . sodium chloride 75 mL/hr at 11/06/18 1306  . ertapenem 1,000 mg (11/07/18 0846)  . TPN ADULT (ION) 40 mL/hr at 11/06/18 1754    Assessment/Plan Sepsis secondary to diverticulitis - blood cultures pending AKI improving              - Creatinine 1.85>>1.46>>0.92(9/14)>0.61 (9/15) Hx CAD - Plavix/ASA on hold Hypertension Type II diabetes - diet controlled OSA - not on CPAP Hypomagnesemia - Mg 0.5 this AM Malnutrition - Pre-alb 14.7 Possible UTI - 21- 50 WBC/HPF (9/13)- No growth on urine culture COVID negative 11/03/18  Acute on chronic diverticulitiswith microperforation -Continue Invanz. WBC normalized.              - Continue NPO. Likely start CLD tomorrow if continues to do well. Some mild pain this AM.              - TPN             - Hopefully will continue to improve  and can be set up to have a single stage partial colectomy in the future. If he fails antibiotic therapy on the Invanz as he has the other antibiotics since June;he would most likely require a colostomy.             - Needs to mobilize. Is deconditioned with a 30 lb weight loss. PT ordered.   FEN- TPN. NPO. VTE- SCDs, okay for chemical prophylaxis from our standpoint. ID: Maxipime 9/12 X 2dose; invanx9/13 >>day4 Follow up: TBD  Plan:  Clear liquids today; continue IV antibiotics and TPN. Will discuss home IV abx, and see if we need to get Case management involved now to start arranging.    We would want him on a diet and off TPN before discharging him even with IV abx.     LOS: 4 days    JENNINGS,WILLARD 11/07/2018 [phone]  Agree with above. Getting better.  Needs to ambulate more (or as much as he can).  Taking po's successfully so far.  No abdominal complaints. Wife in room with patient.  Alphonsa Overall, MD, [facility] Pager: [phone] Office  phone:  [phone]

## 2018-11-07 NOTE — Progress Notes (Addendum)
Physical Therapy Treatment Patient Details Name: Ray Brown MRN: [id number] DOB: [birth year] Today's Date: 11/07/2018    History of Present Illness Ray Brown is a 68 yo male with a history of T2DM, PVD, CAD, and recurrent diverticulitis who presented to the ED today with his wife for emesis.  He has had 30lb weight loss in 6 months.  He was found to have persistent diverticulitis with small abscess/fluid consistent with localized perforation.    PT Comments    Patient progressing with hallway ambulation distance and able to demonstrate turns without LOB or outside support.  Encouraged walking to bathroom during daytime to toilet with nursing assist.  Better balance at EOB with Gaze stability exercises.  Will continue skilled PT to progress mobility to allow d/c home when medically ready.    Follow Up Recommendations  Outpatient PT     Equipment Recommendations  Rolling walker with 5" wheels    Recommendations for Other Services       Precautions / Restrictions Precautions Precautions: Fall    Mobility  Bed Mobility Overal bed mobility: Needs Assistance Bed Mobility: Supine to Sit;Sit to Supine     Supine to sit: Supervision Sit to supine: Supervision   General bed mobility comments: assist for lines  Transfers Overall transfer level: Needs assistance Equipment used: Rolling walker (2 wheeled) Transfers: Sit to/from Stand Sit to Stand: Supervision         General transfer comment: able to stand unaided, but still pulls up on walker  Ambulation/Gait Ambulation/Gait assistance: Min guard Gait Distance (Feet): 220 Feet Assistive device: Rolling walker (2 wheeled) Gait Pattern/deviations: Step-through pattern;Step-to pattern;Decreased stride length     General Gait Details: slow pace with cues for compensation, able to turn and manage walker without LOB   Stairs             Wheelchair Mobility    Modified Rankin (Stroke Patients Only)        Balance Overall balance assessment: Needs assistance Sitting-balance support: Feet supported Sitting balance-Leahy Scale: Good Sitting balance - Comments: able to complete gaze stabilization exercises in sitting without LOB   Standing balance support: Bilateral upper extremity supported Standing balance-Leahy Scale: Poor Standing balance comment: UE support for balance                            Cognition Arousal/Alertness: Awake/alert Behavior During Therapy: WFL for tasks assessed/performed Overall Cognitive Status: Within Functional Limits for tasks assessed                                        Exercises Other Exercises Other Exercises: seated gaze stabilization with 26 sec horizontal head turns prior to stopping due to symptoms, then able to perform vertical head movement x 1 minute    General Comments      Pertinent Vitals/Pain Pain Assessment: No/denies pain    Home Living                      Prior Function            PT Goals (current goals can now be found in the care plan section) Progress towards PT goals: Progressing toward goals    Frequency    Min 3X/week      PT Plan Current plan remains appropriate    Co-evaluation  AM-PAC PT "6 Clicks" Mobility   Outcome Measure  Help needed turning from your back to your side while in a flat bed without using bedrails?: None Help needed moving from lying on your back to sitting on the side of a flat bed without using bedrails?: A Little Help needed moving to and from a bed to a chair (including a wheelchair)?: A Little Help needed standing up from a chair using your arms (e.g., wheelchair or bedside chair)?: None Help needed to walk in hospital room?: A Little Help needed climbing 3-5 steps with a railing? : A Little 6 Click Score: 20    End of Session Equipment Utilized During Treatment: Gait belt Activity Tolerance: Patient tolerated treatment  well Patient left: in bed;with call bell/phone within reach;with family/visitor present   PT Visit Diagnosis: Other abnormalities of gait and mobility (R26.89);Dizziness and giddiness (R42);BPPV BPPV - Right/Left : Left     Time: [id number] PT Time Calculation (min) (ACUTE ONLY): 28 min  Charges:  $Gait Training: 8-22 mins $Neuromuscular Re-education: 8-22 mins                      Magda Kiel, [place] [facility] [phone] 11/07/2018    [name] 11/07/2018, 3:30 PM

## 2018-11-07 NOTE — Progress Notes (Signed)
Physical Therapy Treatment Patient Details Name: Ray Brown MRN: [id number] DOB: June 03, 1950 Today's Date: 11/07/2018    History of Present Illness Ray Brown is a 68 yo male with a history of T2DM, PVD, CAD, and recurrent diverticulitis who presented to the ED today with his wife for emesis.  He has had 30lb weight loss in 6 months.  He was found to have persistent diverticulitis with small abscess/fluid consistent with localized perforation.    PT Comments    Patient able to complete Eply x 2 for repositioning due to L BPPV symptoms.  Able to demonstrate improved symptoms with repetition, but each time demonstrated symptoms coming up from last position of Eply.  Too symptomatic to walk after canalith repositioning so will try to return later for ambulation.   Follow Up Recommendations  Outpatient PT     Equipment Recommendations  Rolling walker with 5" wheels    Recommendations for Other Services       Precautions / Restrictions Precautions Precautions: Fall    Mobility  Bed Mobility Overal bed mobility: Needs Assistance Bed Mobility: Supine to Sit;Sit to Supine     Supine to sit: HOB elevated;Min guard Sit to supine: Supervision   General bed mobility comments: assist for lines  Transfers                    Ambulation/Gait                 Stairs             Wheelchair Mobility    Modified Rankin (Stroke Patients Only)       Balance Overall balance assessment: Needs assistance Sitting-balance support: Feet supported Sitting balance-Leahy Scale: Good Sitting balance - Comments: but needed assist for balance after coming up from Eply                                    Cognition Arousal/Alertness: Awake/alert Behavior During Therapy: WFL for tasks assessed/performed Overall Cognitive Status: Within Functional Limits for tasks assessed                                        Exercises       General Comments General comments (skin integrity, edema, etc.): Completed Eply x 2 for BPPV on L; both times increased symptoms coming up from R sidelying (last position of Eply) needing assist for balance; resolved in few seconds      Pertinent Vitals/Pain Pain Assessment: No/denies pain    Home Living                      Prior Function            PT Goals (current goals can now be found in the care plan section) Progress towards PT goals: Progressing toward goals    Frequency    Min 3X/week      PT Plan      Co-evaluation              AM-PAC PT "6 Clicks" Mobility   Outcome Measure  Help needed turning from your back to your side while in a flat bed without using bedrails?: None Help needed moving from lying on your back to sitting on the side of a flat bed without using bedrails?:  A Little Help needed moving to and from a bed to a chair (including a wheelchair)?: A Little Help needed standing up from a chair using your arms (e.g., wheelchair or bedside chair)?: A Little Help needed to walk in hospital room?: A Little Help needed climbing 3-5 steps with a railing? : A Little 6 Click Score: 19    End of Session   Activity Tolerance: Patient tolerated treatment well Patient left: in bed;with call bell/phone within reach   PT Visit Diagnosis: Other abnormalities of gait and mobility (R26.89);Dizziness and giddiness (R42);BPPV BPPV - Right/Left : Left     Time: 1005-1040 PT Time Calculation (min) (ACUTE ONLY): 35 min  Charges:  $Therapeutic Activity: 8-22 mins $Canalith Rep Proc: 8-22 mins                     Magda Kiel, [place] [facility] [phone] 11/07/2018    Ray Brown 11/07/2018, 3:20 PM

## 2018-11-07 NOTE — Progress Notes (Signed)
PROGRESS NOTE    Ray Brown  EVO:[id number] DOB: May 08, 1950 DOA: 11/03/2018 PCP: [name], MD  Outpatient Specialists:   Brief Narrative:  Ray Brown is a 68 y.o. male with medical history significant of OSA not on CPAP; PVD; HLD; DM; CAD; diverticulosis; and chronic back pain.  Patient presented with nausea and vomiting.  Found to have severe diverticulitis with microperforation and small diverticular abscess.  Reported 30 pound weight loss.  Apparently, patient has had recurrent diverticulitis, followed by [facility] since June, several rounds of abx, most recently in early August.  Patient was admitted by hospitalist service.  Started on antibiotics which were switched to [place] on 11/04/2018.  GI was consulted and surgery was consulted as well.  Surgery has discussed possible surgical intervention either during this hospitalization or in future as outpatient based on how he responds to IV antibiotics.  Patient then developed some vertigo.  Had CT head and then MRI of the brain without contrast followed by MRI of the brain with contrast which showed 1. Post-contrast MRI confirms generalized pachymeningeal thickening and enhancement as the dural abnormality on the earlier noncontrast exam. No associated leptomeningeal enhancement. No abnormal parenchymal enhancement.  2. Diffuse smooth dural thickening is nonspecific has a fairly broad differential including due to: Intracranial hypotension, recent lumbar puncture, prior intracranial surgery, prior subdural hematoma, inflammatory pachymeningitis, or less likely infectious Meningitis. Patient was diagnosed to have BPPV.  PT was consulted and he improved with Epley maneuvers.  Subsequently, his pain improved.  He was started on clear liquid diet by general surgery.  Assessment & Plan:   Principal Problem:   Diverticulitis of colon with perforation Active Problems:   Diabetes mellitus with peripheral artery disease (HCC)  Coronary artery disease involving native coronary artery of native heart without angina pectoris   Dyslipidemia   OSA (obstructive sleep apnea)  Sepsis secondary to diverticulitis with microperforation and abscess    he has severe sigmoid diverticulosis with wall thickening and adjacent inflammatory fat stranding with extraluminal air on repeated imaging on 6/9, 7/8, 8/10, and 11/03/2018.  He met sepsis criteria based on leukocytosis, tachycardia and tachypnea.  Sepsis has resolved at this point in time.  Blood culture negative so far. -Patient's abdominal pain is under control with the pain medications.  He does not have any nausea or vomiting.  Continue Invanz.  Started on clears by general surgery.  Acute kidney injury: -Serum creatinine had peaked to 1.85.  This was likely prerenal.  This has resolved.  Renal ultrasound unremarkable.  Severe hypomagnesemia: 1.6 today.  Will replace with IV magnesium.  Mild hypophosphatemia: Replace again today..  Vertigo/BPPV: This is started approximately 2 days ago.  He ended up having CT head followed by MRI brain without contrast and now MRI brain with contrast with impression as below 1. Post-contrast MRI confirms generalized pachymeningeal thickening and enhancement as the dural abnormality on the earlier noncontrast exam. No associated leptomeningeal enhancement. No abnormal parenchymal enhancement.  2. Diffuse smooth dural thickening is nonspecific has a fairly broad differential including due to: Intracranial hypotension, recent lumbar puncture, prior intracranial surgery, prior subdural hematoma, inflammatory pachymeningitis, or less likely infectious Meningitis. On my exam, patient had some mild horizontal nystagmus suggestive of possible BPPV.  PT was consulted and they did an Epley maneuvers on him and that has helped significantly to improve his symptoms.  H/o CAD -Hold ASA and Plavix for now for possible need for surgery.  No signs  of ACS.  HTN -  Blood pressure very well controlled.  On IV Lopressor as needed.  DM -Diet controlled.  Last hemoglobin A1c 6.5.  Continue SSI.  HLD -Continue Lipitor -Hold Niaspan and Fenofibrate as these are unlikely to have significant utility during hospitalization  OSA -Does not wear CPAP  Note: This patient has been tested and is negative for the novel coronavirus COVID-19.  DVT prophylaxis:SCDs Code Status:Full - confirmed with patient/family Family Communication:No family present at bedside.  Plan of care discussed with the patient and he verbalized understanding. Disposition Plan:Home once clinically improved and cleared by general surgery. Consults called:GI; surgery  Procedures:   None  Antimicrobials:   IV ertapenem   Subjective: Patient seen and examined.  Feels better.  Abdominal pain is improved.  No nausea or vomiting.  Still was dizzy but he was seen early this morning by me before he had another session of PT afterwards.  Objective: Vitals:   11/06/18 2009 11/07/18 0519 11/07/18 0846 11/07/18 1436  BP: (!) 130/54 127/70 132/74 120/67  Pulse: 63 61 (!) 58 63  Resp: (!) 22 19    Temp: 98.4 F (36.9 C) 97.8 F (36.6 C)  98.4 F (36.9 C)  TempSrc: Oral Oral  Oral  SpO2: 98% 96%  98%  Weight:  80.3 kg    Height:        Intake/Output Summary (Last 24 hours) at 11/07/2018 1533 Last data filed at 11/07/2018 1100 Gross per 24 hour  Intake 2176.15 ml  Output 2000 ml  Net 176.15 ml   Filed Weights   11/05/18 0500 11/06/18 0538 11/07/18 0519  Weight: 79.4 kg 80.6 kg 80.3 kg    Examination:  General exam: Appears calm and comfortable  Respiratory system: Clear to auscultation. Respiratory effort normal. Cardiovascular system: S1 & S2 heard, RRR. No JVD, murmurs, rubs, gallops or clicks. No pedal edema. Gastrointestinal system: Abdomen is nondistended, soft and minimal right lower quadrant tenderness. No organomegaly or masses felt.  Normal bowel sounds heard. Central nervous system: Alert and oriented. No focal neurological deficits. Extremities: Symmetric 5 x 5 power. Skin: No rashes, lesions or ulcers.  Psychiatry: Judgement and insight appear normal. Mood & affect appropriate.   Data Reviewed: I have personally reviewed following labs and imaging studies  CBC: Recent Labs  Lab 11/03/18 0648 11/04/18 0355 11/05/18 0626 11/06/18 0541 11/07/18 0500  WBC 11.1* 21.8* 12.9* 8.6 7.8  NEUTROABS  --   --   --  6.2 5.6  HGB 16.7 16.9 13.7 12.6* 12.9*  HCT 48.6 48.0 41.4 37.3* 38.6*  MCV 92.7 91.3 96.3 97.4 98.0  PLT 293 295 230 197 450   Basic Metabolic Panel: Recent Labs  Lab 11/05/18 0626 11/05/18 1833 11/06/18 0541 11/06/18 0542 11/07/18 0500 11/07/18 0829  NA 145 142 142  --  132* 138  K 4.3 4.5 3.9  --  6.1* 4.1  CL 110 108 111  --  104 110  CO2 '25 25 22  ' --  22 24  GLUCOSE 98 86 79  --  492* 137*  BUN 27* 23 17  --  11 10  CREATININE 0.92 0.70 0.61  --  0.58* 0.58*  CALCIUM 8.0* 8.0* 7.6*  --  8.1* 8.5*  MG 0.5* 1.4* 1.8  --  2.2 1.6*  PHOS 2.3* 2.9 2.0* 2.0* 4.2 2.1*   GFR: Estimated Creatinine Clearance: 84.3 mL/min (A) (by C-G formula based on SCr of 0.58 mg/dL (L)). Liver Function Tests: Recent Labs  Lab 11/03/18 [phone]  11/05/18 [phone]  11/05/18 1833 11/06/18 0541 11/07/18 0500 11/07/18 0829  AST 21  --   --   --  38 29 26  ALT 15  --   --   --  '16 13 15  ' ALKPHOS 84  --   --   --  60 59 65  BILITOT 1.1  --   --   --  1.3* 1.2 0.7  PROT 7.0  --   --   --  4.5* 4.3* 5.2*  ALBUMIN 3.9   < > 2.8* 2.8* 2.6* 2.5* 2.7*   < > = values in this interval not displayed.   No results for input(s): LIPASE, AMYLASE in the last 168 hours. No results for input(s): AMMONIA in the last 168 hours. Coagulation Profile: No results for input(s): INR, PROTIME in the last 168 hours. Cardiac Enzymes: No results for input(s): CKTOTAL, CKMB, CKMBINDEX, TROPONINI in the last 168 hours. BNP (last 3  results) No results for input(s): PROBNP in the last 8760 hours. HbA1C: Recent Labs    11/06/18 0541  HGBA1C 6.7*   CBG: Recent Labs  Lab 11/07/18 0010 11/07/18 0510 11/07/18 0512 11/07/18 0739 11/07/18 1140  GLUCAP 114* 438* 145* 135* 110*   Lipid Profile: Recent Labs    11/06/18 0541  TRIG 141   Thyroid Function Tests: No results for input(s): TSH, T4TOTAL, FREET4, T3FREE, THYROIDAB in the last 72 hours. Anemia Panel: No results for input(s): VITAMINB12, FOLATE, FERRITIN, TIBC, IRON, RETICCTPCT in the last 72 hours. Urine analysis:    Component Value Date/Time   COLORURINE AMBER (A) 11/04/2018 1821   APPEARANCEUR HAZY (A) 11/04/2018 1821   LABSPEC >1.046 (H) 11/04/2018 1821   PHURINE 5.0 11/04/2018 1821   GLUCOSEU 50 (A) 11/04/2018 1821   HGBUR NEGATIVE 11/04/2018 1821   BILIRUBINUR NEGATIVE 11/04/2018 1821   KETONESUR 5 (A) 11/04/2018 1821   PROTEINUR 100 (A) 11/04/2018 1821   UROBILINOGEN 0.2 01/22/2010 1943   NITRITE NEGATIVE 11/04/2018 1821   LEUKOCYTESUR NEGATIVE 11/04/2018 1821   Sepsis Labs: '@LABRCNTIP' (procalcitonin:4,lacticidven:4)  ) Recent Results (from the past 240 hour(s))  SARS Coronavirus 2 [facility] order, Performed in [facility] hospital lab) Nasopharyngeal Nasopharyngeal Swab     Status: None   Collection Time: 11/03/18  3:36 PM   Specimen: Nasopharyngeal Swab  Result Value Ref Range Status   SARS Coronavirus 2 NEGATIVE NEGATIVE Final    Comment: (NOTE) If result is NEGATIVE SARS-CoV-2 target nucleic acids are NOT DETECTED. The SARS-CoV-2 RNA is generally detectable in upper and lower  respiratory specimens during the acute phase of infection. The lowest  concentration of SARS-CoV-2 viral copies this assay can detect is 250  copies / mL. A negative result does not preclude SARS-CoV-2 infection  and should not be used as the sole basis for treatment or other  patient management decisions.  A negative result may occur with  improper  specimen collection / handling, submission of specimen other  than nasopharyngeal swab, presence of viral mutation(s) within the  areas targeted by this assay, and inadequate number of viral copies  (<250 copies / mL). A negative result must be combined with clinical  observations, patient history, and epidemiological information. If result is POSITIVE SARS-CoV-2 target nucleic acids are DETECTED. The SARS-CoV-2 RNA is generally detectable in upper and lower  respiratory specimens dur ing the acute phase of infection.  Positive  results are indicative of active infection with SARS-CoV-2.  Clinical  correlation with patient history and other diagnostic information is  necessary to  determine patient infection status.  Positive results do  not rule out bacterial infection or co-infection with other viruses. If result is PRESUMPTIVE POSTIVE SARS-CoV-2 nucleic acids MAY BE PRESENT.   A presumptive positive result was obtained on the submitted specimen  and confirmed on repeat testing.  While 2019 novel coronavirus  (SARS-CoV-2) nucleic acids may be present in the submitted sample  additional confirmatory testing may be necessary for epidemiological  and / or clinical management purposes  to differentiate between  SARS-CoV-2 and other Sarbecovirus currently known to infect humans.  If clinically indicated additional testing with an alternate test  methodology [phone]) is advised. The SARS-CoV-2 RNA is generally  detectable in upper and lower respiratory sp ecimens during the acute  phase of infection. The expected result is Negative. Fact Sheet for Patients:  StrictlyIdeas.no Fact Sheet for Healthcare Providers: BankingDealers.co.za This test is not yet approved or cleared by the [place] FDA and has been authorized for detection and/or diagnosis of SARS-CoV-2 by FDA under an Emergency Use Authorization (EUA).  This EUA will remain in  effect (meaning this test can be used) for the duration of the COVID-19 declaration under Section 564(b)(1) of the Act, 21 U.S.C. section 360bbb-3(b)(1), unless the authorization is terminated or revoked sooner. Performed at [facility], [place] [address] [place], [place] [address]   Culture, blood (routine x 2)     Status: None (Preliminary result)   Collection Time: 11/03/18  5:58 PM   Specimen: BLOOD  Result Value Ref Range Status   Specimen Description BLOOD LEFT ANTECUBITAL  Final   Special Requests   Final    BOTTLES DRAWN AEROBIC AND ANAEROBIC Blood Culture adequate volume   Culture   Final    NO GROWTH 4 DAYS Performed at [facility], [place] [address], [place], [place] [address]    Report Status PENDING  Incomplete  Culture, blood (routine x 2)     Status: None (Preliminary result)   Collection Time: 11/03/18  6:40 PM   Specimen: BLOOD LEFT HAND  Result Value Ref Range Status   Specimen Description BLOOD LEFT HAND  Final   Special Requests   Final    BOTTLES DRAWN AEROBIC AND ANAEROBIC Blood Culture results may not be optimal due to an inadequate volume of blood received in culture bottles   Culture   Final    NO GROWTH 4 DAYS Performed at [facility], [place] [address] [place], [place] [address]    Report Status PENDING  Incomplete  Culture, Urine     Status: None   Collection Time: 11/04/18  6:57 AM   Specimen: Urine, Random  Result Value Ref Range Status   Specimen Description URINE, RANDOM  Final   Special Requests NONE  Final   Culture   Final    NO GROWTH Performed at [facility], [place] [address] [place], [place] [address]    Report Status 11/05/2018 FINAL  Final         Radiology Studies: Mr Brain Wo Contrast  Addendum Date: 11/05/2018   ADDENDUM REPORT: 11/05/2018 20:41 ADDENDUM: Study discussed by telephone with PA Tylene Fantasia on 11/05/2018 at 2037 hours. Electronically Signed   By: Genevie Ann M.D.   On: 11/05/2018 20:41   Result  Date: 11/05/2018 CLINICAL DATA:  68 year old male with episodic vertigo. EXAM: MRI HEAD WITHOUT CONTRAST TECHNIQUE: Multiplanar, multiecho pulse sequences of the brain and surrounding structures were obtained without intravenous contrast. COMPARISON:  CT head without contrast 02/02/2010. FINDINGS: Brain: There is abnormal smooth posterosuperior convexity dural thickening or small subdural hematomas, best demonstrated on FLAIR series 4, image 21. These measure 2-3 millimeters in thickness bilaterally. Trace similar anterior dural thickening or extra-axial blood on series 4, image 18. No intracranial mass effect. No other intracranial blood products identified. No restricted diffusion to suggest acute infarction. No midline shift, mass effect, evidence of mass lesion, ventriculomegaly. Cervicomedullary junction and pituitary are within normal limits. Pearline Cables and white matter signal is within normal limits throughout the brain. No encephalomalacia identified. Vascular: Major intracranial vascular flow voids are preserved although the vertebrobasilar system is diminutive. This appears to beyond the basis of bilateral fetal type PCA origins (normal variant). Skull and upper cervical spine: Negative visible cervical spine. Normal bone marrow signal. Sinuses/Orbits: Postoperative changes to both globes, otherwise negative orbits. Small right maxillary sinus mucous retention cyst. Other paranasal sinuses are clear. Other: Mastoids are clear. Scalp and face soft tissues appear negative. IMPRESSION: 1. Positive for bilateral superior convexity Dural Thickening or less likely Trace Bilateral Subdural Hematomas. Recommend follow-up post-contrast Brain MRI which should confirm. Preliminary differential considerations for smooth dural thickening include dural inflammation, sequelae of lumbar puncture, residual of prior subdural hematoma, and intracranial hypotension. 2. No other acute intracranial abnormality. And otherwise negative  for age noncontrast MRI appearance of the brain. Electronically Signed: By: Genevie Ann M.D. On: 11/05/2018 20:23   Mr Jeri Cos Contrast  Result Date: 11/06/2018 CLINICAL DATA:  68 year old male with vertigo, dizziness. Abnormal appearance of the dura or subdural space over the convexities on the earlier noncontrast MRI. EXAM: MRI HEAD WITH CONTRAST TECHNIQUE: Multiplanar, multiecho pulse sequences of the brain and surrounding structures were obtained with intravenous contrast. CONTRAST:  7.78m GADAVIST GADOBUTROL 1 MMOL/ML IV SOLN COMPARISON:  Noncontrast MRI 11/05/2018. FINDINGS: These postcontrast images do confirm the presence of generalized pachymeningeal thickening and enhancement (series 2, image 36 and series 3, image 11) responsible for the convexity dural appearance on the earlier noncontrast MRI. There is no evidence of subdural fluid collection or blood. The pachymeningeal thickening appears smooth throughout. The basilar cisterns are also somewhat affected. There is no associated leptomeningeal thickening or enhancement. No associated abnormal parenchymal enhancement, incidental left anterior frontal lobe developmental venous anomaly (normal variant series 2, image 35). The major dural venous sinuses are enhancing and appear to be patent. No abnormal IAC enhancement. Negative visible cervical spine and spinal cord. Visualized bone marrow signal is within normal limits. IMPRESSION: 1. Post-contrast MRI confirms generalized pachymeningeal thickening and enhancement as the dural abnormality on the earlier noncontrast exam. No associated leptomeningeal enhancement. No abnormal parenchymal enhancement. 2. Diffuse smooth dural thickening is nonspecific has a fairly broad differential including due to: Intracranial hypotension, recent lumbar puncture, prior intracranial surgery, prior subdural hematoma, inflammatory pachymeningitis, or less likely infectious meningitis. Electronically Signed   By: HGenevie AnnM.D.    On: 11/06/2018 00:31        Scheduled Meds:  aspirin EC  81 mg Oral Daily   atorvastatin  80 mg Oral QHS   Chlorhexidine Gluconate Cloth  6 each Topical Daily   insulin aspart  0-9 Units Subcutaneous Q4H   metoprolol succinate  25 mg Oral Daily   sodium chloride flush  10-40 mL Intracatheter Q12H   sodium chloride flush  3 mL Intravenous Q12H   Continuous Infusions:  sodium chloride 35 mL/hr at 11/07/18 1153   ertapenem 1,000 mg (11/07/18 0846)   sodium phosphate  Dextrose 5% IVPB 20 mmol (11/07/18 1201)   TPN ADULT (ION) 40 mL/hr at 11/06/18 1754   TPN ADULT (ION)       LOS: 4 days    Time spent: 30 minutes  Ishmael Holter, MD  [facility] Pager #: [phone] 7PM-7AM contact night coverage as above

## 2018-11-07 NOTE — Care Management (Addendum)
1424 11-07-18 CM received referral for Invanz cost. Per Pam with [facility] ([facility]) cost will be $31.00 per day for Invanz with part D Coverage. CM will continue to monitor for transition of care needs. Bethena Roys, RN,BSN [place]

## 2018-11-08 LAB — CULTURE, BLOOD (ROUTINE X 2)
Culture: NO GROWTH
Culture: NO GROWTH
Special Requests: ADEQUATE

## 2018-11-08 LAB — COMPREHENSIVE METABOLIC PANEL
ALT: 15 U/L (ref 0–44)
AST: 25 U/L (ref 15–41)
Albumin: 2.7 g/dL — ABNORMAL LOW (ref 3.5–5.0)
Alkaline Phosphatase: 74 U/L (ref 38–126)
Anion gap: 7 (ref 5–15)
BUN: 7 mg/dL — ABNORMAL LOW (ref 8–23)
CO2: 24 mmol/L (ref 22–32)
Calcium: 8.7 mg/dL — ABNORMAL LOW (ref 8.9–10.3)
Chloride: 108 mmol/L (ref 98–111)
Creatinine, Ser: 0.58 mg/dL — ABNORMAL LOW (ref 0.61–1.24)
GFR calc Af Amer: >60 mL/min (ref >60)
GFR calc non Af Amer: >60 mL/min (ref >60)
Glucose, Bld: 129 mg/dL — ABNORMAL HIGH (ref 70–99)
Potassium: 4.2 mmol/L (ref 3.5–5.1)
Sodium: 139 mmol/L (ref 135–145)
Total Bilirubin: 0.9 mg/dL (ref 0.3–1.2)
Total Protein: 5.2 g/dL — ABNORMAL LOW (ref 6.5–8.1)

## 2018-11-08 LAB — CBC WITH DIFFERENTIAL/PLATELET
Abs Immature Granulocytes: 0.02 10*3/uL (ref 0.00–0.07)
Basophils Absolute: 0 10*3/uL (ref 0.0–0.1)
Basophils Relative: 0 %
Eosinophils Absolute: 0.3 10*3/uL (ref 0.0–0.5)
Eosinophils Relative: 4 %
HCT: 39.9 % (ref 39.0–52.0)
Hemoglobin: 13.9 g/dL (ref 13.0–17.0)
Immature Granulocytes: 0 %
Lymphocytes Relative: 24 %
Lymphs Abs: 1.7 10*3/uL (ref 0.7–4.0)
MCH: 33.1 pg (ref 26.0–34.0)
MCHC: 34.8 g/dL (ref 30.0–36.0)
MCV: 95 fL (ref 80.0–100.0)
Monocytes Absolute: 0.6 10*3/uL (ref 0.1–1.0)
Monocytes Relative: 9 %
Neutro Abs: 4.3 10*3/uL (ref 1.7–7.7)
Neutrophils Relative %: 63 %
Platelets: 187 10*3/uL (ref 150–400)
RBC: 4.2 MIL/uL — ABNORMAL LOW (ref 4.22–5.81)
RDW: 12.6 % (ref 11.5–15.5)
WBC: 6.9 10*3/uL (ref 4.0–10.5)
nRBC: 0 % (ref 0.0–0.2)

## 2018-11-08 LAB — GLUCOSE, CAPILLARY
Glucose-Capillary: 112 mg/dL — ABNORMAL HIGH (ref 70–99)
Glucose-Capillary: 120 mg/dL — ABNORMAL HIGH (ref 70–99)
Glucose-Capillary: 121 mg/dL — ABNORMAL HIGH (ref 70–99)
Glucose-Capillary: 129 mg/dL — ABNORMAL HIGH (ref 70–99)
Glucose-Capillary: 131 mg/dL — ABNORMAL HIGH (ref 70–99)
Glucose-Capillary: 146 mg/dL — ABNORMAL HIGH (ref 70–99)
Glucose-Capillary: 81 mg/dL (ref 70–99)

## 2018-11-08 LAB — PHOSPHORUS: Phosphorus: 2.8 mg/dL (ref 2.5–4.6)

## 2018-11-08 LAB — MAGNESIUM: Magnesium: 1.6 mg/dL — ABNORMAL LOW (ref 1.7–2.4)

## 2018-11-08 MED ORDER — TRAVASOL 10 % IV SOLN
INTRAVENOUS | Status: DC
  Filled 2018-11-08: qty 1056

## 2018-11-08 MED ORDER — TRAVASOL 10 % IV SOLN
INTRAVENOUS | Status: AC
  Administered 2018-11-08: 17:00:00 via INTRAVENOUS
  Filled 2018-11-08: qty 528

## 2018-11-08 MED ORDER — MAGNESIUM SULFATE 2 GM/50ML IV SOLN
2.0000 g | Freq: Once | INTRAVENOUS | Status: AC
  Administered 2018-11-08: 11:00:00 2 g via INTRAVENOUS
  Filled 2018-11-08: qty 50

## 2018-11-08 NOTE — Progress Notes (Addendum)
[place] NOTE   Pharmacy Consult for TPN Indication: Diverticulitis with microperforation  Patient Measurements: Height: 5\' 8"  (172.7 cm) Weight: 177 lb 4 oz (80.4 kg) IBW/kg (Calculated) : 68.4 TPN AdjBW (KG): 81.6 Body mass index is 26.95 kg/m.   Assessment:  Acute on chronic diverticulitis with perforation. Has been NPO, significant weight loss. Patient is malnourished. Attempting to manage conservatively and avoid surgery.  GI: Prealbumin 14.7,  ~14% weight loss over 9 months.  LBM 9/11. Clear liquid diet started 9/16.  Endo: cbgs 93-137  Insulin requirements in the past 24 hours: 1 units Lytes: K 4.2. Phos 2.8 and Mg low at 1.6 - replaced today.  Renal: SCr 0.6 >> 0.58, BUN 10. UOP 1.9 mL/kg/hr.  Pulm: RA Cards: SB in 50-60s, BP stable Hepatobil: LFTs/Tbili/TG wnl Neuro: A&O ID: Acute on chronic diverticulitis with microperforation on Ertapenem per surgery.   TPN Access: PICC TPN start date: 9/15 Nutritional Goals (per RD recommendation on 9/15): KCal: 2000-2200 Protein: 95-100g Fluid: 2L/day   Current Nutrition:  NPO  Plan:  -Increase TPN to 80 ml/hr -This TPN provides 104 g of protein, 288 g of dextrose, and 42 g of lipids which provides 1824 kCals per day, meeting ~100 % of patient needs -Electrolytes in TPN: Increase Mg and Phos, others to standard, [id number] 1:1 -Add MVI, trace elements to TPN -Continue Sensitive SSI q4h for now -D/C NS when TPN increased to 80 mL/hr.  -Follow-up ability to tolerate enteral diet and wean TPN.   Addendum:   Got order to decrease TPN in 1/2. Will leave tonight's TPN at 40 ml/hr -This TPN provides 52 g of protein, 144 g of dextrose, and 21 g of lipids which provides 912 kCals per day, meeting ~50 % of patient needs  Alanda Slim, PharmD, [facility] Clinical Pharmacist Please see AMION for all Pharmacists' Contact Phone Numbers 11/08/2018, 9:12 AM

## 2018-11-08 NOTE — Progress Notes (Addendum)
CC: Abdominal pain  Subjective: He remains pain-free.  Abdomen is soft.  He is tolerating clear liquids well.  Objective: Vital signs in last 24 hours: Temp:  [97.5 F (36.4 C)-98.4 F (36.9 C)] 97.8 F (36.6 C) (09/17 0431) Pulse Rate:  [58-66] 65 (09/17 0431) Resp:  [15-23] 19 (09/17 0431) BP: (115-132)/(66-74) 115/72 (09/17 0431) SpO2:  [96 %-98 %] 96 % (09/17 0431) Weight:  [80.4 kg] 80.4 kg (09/17 0431) Last BM Date: 11/05/18 1560 PO 2100 IV 3575 urine CMP is normal, except a magnesium of 1.6 WBC is 6.9 Per hour vital signs are stable  Intake/Output from previous day: 09/16 0701 - 09/17 0700 In: 3716.8 [P.O.:1560; I.V.:2046.4; IV Piggyback:110.4] Out: [id number] [Urine:3575] Intake/Output this shift: No intake/output data recorded.  General appearance: alert and cooperative Resp: clear to auscultation bilaterally GI: soft, non-tender; bowel sounds normal; no masses,  no organomegaly  Lab Results:  Recent Labs    11/07/18 0500 11/08/18 0505  WBC 7.8 6.9  HGB 12.9* 13.9  HCT 38.6* 39.9  PLT 206 187    BMET Recent Labs    11/07/18 0829 11/08/18 0505  NA 138 139  K 4.1 4.2  CL 110 108  CO2 24 24  GLUCOSE 137* 129*  BUN 10 7*  CREATININE 0.58* 0.58*  CALCIUM 8.5* 8.7*   PT/INR No results for input(s): LABPROT, INR in the last 72 hours.  Recent Labs  Lab 11/03/18 0648  11/05/18 1833 11/06/18 0541 11/07/18 0500 11/07/18 0829 11/08/18 0505  AST 21  --   --  38 29 26 25   ALT 15  --   --  16 13 15 15   ALKPHOS 84  --   --  60 59 65 74  BILITOT 1.1  --   --  1.3* 1.2 0.7 0.9  PROT 7.0  --   --  4.5* 4.3* 5.2* 5.2*  ALBUMIN 3.9   < > 2.8* 2.6* 2.5* 2.7* 2.7*   < > = values in this interval not displayed.     Lipase     Component Value Date/Time   LIPASE 25 03/24/2017 1857     Medications: . aspirin EC  81 mg Oral Daily  . atorvastatin  80 mg Oral QHS  . Chlorhexidine Gluconate Cloth  6 each Topical Daily  . [START ON 11/12/2018]  influenza vaccine adjuvanted  0.5 mL Intramuscular Tomorrow-1000  . insulin aspart  0-9 Units Subcutaneous Q4H  . metoprolol succinate  25 mg Oral Daily  . sodium chloride flush  10-40 mL Intracatheter Q12H  . sodium chloride flush  3 mL Intravenous Q12H    Assessment/Plan Sepsis secondary to diverticulitis - blood cultures pending AKI improving  - Creatinine 1.85>>1.46>>0.92(9/14)>0.61 (9/15) Hx CAD - Plavix/ASA on hold Hypertension Type II diabetes - diet controlled OSA - not on CPAP Hypomagnesemia - Mg 0.5 this AM Malnutrition -Pre-alb 14.7 Possible UTI - 21- 50 WBC/HPF (9/13)- No growth on urine culture COVID negative 11/03/18  Acute on chronic diverticulitiswith microperforation -Continue Invanz. WBC normalized.  - Continue NPO. Likely start CLD tomorrow if continues to do well. Some mild pain this AM.  - TPN - Hopefully will continue to improve and can be set up to have a single stage partial colectomy in the future. If he fails antibiotic therapy on the Invanz as he has the other antibiotics since June;he would most likely require a colostomy. - Needs to mobilize. Is deconditioned with a 30 lb weight loss. PT ordered.  FEN-TPN/clears  VTE-SCDs, okay for chemical prophylaxis from our standpoint. ID: Maxipime 9/12 X 2dose; invanx9/13 >>day5 Follow up: TBD   Plan: We will advance his diet to full liquids today if he does well we will advance to a soft in the a.m.  Decrease his TPN to half dose.  If we get him up to a soft diet he is pain-free can go home.  We will discuss discharge antibiotics and duration.  LOS: 5 days    JENNINGS,WILLARD 11/08/2018 [phone]  Agree with above. Continues to improve.  Alphonsa Overall, MD, [facility] Pager: [phone] Office phone:  [phone]

## 2018-11-08 NOTE — Progress Notes (Signed)
PROGRESS NOTE    Ray Brown  [id number] DOB: Jan 28, 1951 DOA: 11/03/2018 PCP: [name], MD  Outpatient Specialists:   Brief Narrative:  Ray Brown is a 68 y.o. male with medical history significant of OSA not on CPAP; PVD; HLD; DM; CAD; diverticulosis; and chronic back pain.  Patient presented with nausea and vomiting.  Found to have severe diverticulitis with microperforation and small diverticular abscess.  Reported 30 pound weight loss.  Apparently, patient has had recurrent diverticulitis, followed by [facility] since June, several rounds of abx, most recently in early August.  Patient was admitted by hospitalist service.  Started on antibiotics which were switched to [place] on 11/04/2018.  GI was consulted and surgery was consulted as well.  Surgery has discussed possible surgical intervention either during this hospitalization or in future as outpatient based on how he responds to IV antibiotics.  Patient then developed some vertigo.  Had CT head and then MRI of the brain without contrast followed by MRI of the brain with contrast which showed 1. Post-contrast MRI confirms generalized pachymeningeal thickening and enhancement as the dural abnormality on the earlier noncontrast exam. No associated leptomeningeal enhancement. No abnormal parenchymal enhancement.  2. Diffuse smooth dural thickening is nonspecific has a fairly broad differential including due to: Intracranial hypotension, recent lumbar puncture, prior intracranial surgery, prior subdural hematoma, inflammatory pachymeningitis, or less likely infectious Meningitis. Patient was diagnosed to have BPPV.  PT was consulted and he improved with Epley maneuvers.  Subsequently, his pain improved.  He was started on clear liquid diet by general surgery which he tolerated and today he is going to be full liquid diet.  Assessment & Plan:   Principal Problem:   Diverticulitis of colon with perforation Active Problems:    Diabetes mellitus with peripheral artery disease (HCC)   Coronary artery disease involving native coronary artery of native heart without angina pectoris   Dyslipidemia   OSA (obstructive sleep apnea)  Sepsis secondary to diverticulitis with microperforation and abscess    he has severe sigmoid diverticulosis with wall thickening and adjacent inflammatory fat stranding with extraluminal air on repeated imaging on 6/9, 7/8, 8/10, and 11/03/2018.  He met sepsis criteria based on leukocytosis, tachycardia and tachypnea.  Sepsis has resolved at this point in time.  Blood culture negative so far. -No more abdominal pain, nausea or vomiting.  Tolerating clears.  Advanced to full liquid diet by general surgery.  Continue Invanz.  Acute kidney injury: -Serum creatinine had peaked to 1.85.  This was likely prerenal.  This has resolved.  Renal ultrasound unremarkable.  Severe hypomagnesemia: 1.6 again today.  Will replace with IV magnesium.  Mild hypophosphatemia: Replace again today..  Vertigo/BPPV: This is started approximately 2 days ago.  He ended up having CT head followed by MRI brain without contrast and now MRI brain with contrast with impression as below 1. Post-contrast MRI confirms generalized pachymeningeal thickening and enhancement as the dural abnormality on the earlier noncontrast exam. No associated leptomeningeal enhancement. No abnormal parenchymal enhancement.  2. Diffuse smooth dural thickening is nonspecific has a fairly broad differential including due to: Intracranial hypotension, recent lumbar puncture, prior intracranial surgery, prior subdural hematoma, inflammatory pachymeningitis, or less likely infectious Meningitis. Feels better.  No more nystagmus.  H/o CAD -Hold ASA and Plavix for now for possible need for surgery.  No signs of ACS.  HTN -Blood pressure very well controlled.  On IV Lopressor as needed.  DM -Diet controlled.  Last hemoglobin A1c 6.5.  Continue SSI.  HLD -Continue Lipitor -Hold Niaspan and Fenofibrate as these are unlikely to have significant utility during hospitalization  OSA -Does not wear CPAP  Note: This patient has been tested and is negative for the novel coronavirus COVID-19.  DVT prophylaxis:SCDs Code Status:Full - confirmed with patient/family Family Communication:No family present at bedside.  Plan of care discussed with the patient and he verbalized understanding. Disposition Plan:Home once clinically improved and cleared by general surgery, potentially tomorrow. Consults called:GI; surgery  Procedures:   None  Antimicrobials:   IV ertapenem   Subjective: Patient seen and examined.  Feels a lot better but still with some vertigo.  No more nausea, vomiting or abdominal pain.  Objective: Vitals:   11/07/18 1622 11/07/18 2014 11/07/18 2020 11/08/18 0431  BP:   124/66 115/72  Pulse:   66 65  Resp: (!) 23 15 (!) 22 19  Temp:   (!) 97.5 F (36.4 C) 97.8 F (36.6 C)  TempSrc:   Axillary Oral  SpO2:   98% 96%  Weight:    80.4 kg  Height:        Intake/Output Summary (Last 24 hours) at 11/08/2018 1117 Last data filed at 11/08/2018 1055 Gross per 24 hour  Intake 3222.17 ml  Output 3350 ml  Net -127.83 ml   Filed Weights   11/06/18 0538 11/07/18 0519 11/08/18 0431  Weight: 80.6 kg 80.3 kg 80.4 kg    Examination:  General exam: Appears calm and comfortable  Respiratory system: Clear to auscultation. Respiratory effort normal. Cardiovascular system: S1 & S2 heard, RRR. No JVD, murmurs, rubs, gallops or clicks. No pedal edema. Gastrointestinal system: Abdomen is nondistended, soft and nontender. No organomegaly or masses felt. Normal bowel sounds heard. Central nervous system: Alert and oriented. No focal neurological deficits. Extremities: Symmetric 5 x 5 power. Skin: No rashes, lesions or ulcers.  Psychiatry: Judgement and insight appear normal. Mood & affect appropriate.    Data Reviewed: I have personally reviewed following labs and imaging studies  CBC: Recent Labs  Lab 11/04/18 0355 11/05/18 0626 11/06/18 0541 11/07/18 0500 11/08/18 0505  WBC 21.8* 12.9* 8.6 7.8 6.9  NEUTROABS  --   --  6.2 5.6 4.3  HGB 16.9 13.7 12.6* 12.9* 13.9  HCT 48.0 41.4 37.3* 38.6* 39.9  MCV 91.3 96.3 97.4 98.0 95.0  PLT 295 230 197 206 536   Basic Metabolic Panel: Recent Labs  Lab 11/05/18 1833 11/06/18 0541 11/06/18 0542 11/07/18 0500 11/07/18 0829 11/08/18 0505  NA 142 142  --  132* 138 139  K 4.5 3.9  --  6.1* 4.1 4.2  CL 108 111  --  104 110 108  CO2 25 22  --  _0 GLUCOSE 86 79  --  492* 137* 129*  BUN 23 17  --  11 10 7*  CREATININE 0.70 0.61  --  0.58* 0.58* 0.58*  CALCIUM 8.0* 7.6*  --  8.1* 8.5* 8.7*  MG 1.4* 1.8  --  2.2 1.6* 1.6*  PHOS 2.9 2.0* 2.0* 4.2 2.1* 2.8   GFR: Estimated Creatinine Clearance: 84.3 mL/min (A) (by C-G formula based on SCr of 0.58 mg/dL (L)). Liver Function Tests: Recent Labs  Lab 11/03/18 0648  11/05/18 1833 11/06/18 0541 11/07/18 0500 11/07/18 0829 11/08/18 0505  AST 21  --   --  38 _1 ALT 15  --   --  _2 ALKPHOS 84  --   --  60  59 65 74  BILITOT 1.1  --   --  1.3* 1.2 0.7 0.9  PROT 7.0  --   --  4.5* 4.3* 5.2* 5.2*  ALBUMIN 3.9   < > 2.8* 2.6* 2.5* 2.7* 2.7*   < > = values in this interval not displayed.   No results for input(s): LIPASE, AMYLASE in the last 168 hours. No results for input(s): AMMONIA in the last 168 hours. Coagulation Profile: No results for input(s): INR, PROTIME in the last 168 hours. Cardiac Enzymes: No results for input(s): CKTOTAL, CKMB, CKMBINDEX, TROPONINI in the last 168 hours. BNP (last 3 results) No results for input(s): PROBNP in the last 8760 hours. HbA1C: Recent Labs    11/06/18 0541  HGBA1C 6.7*   CBG: Recent Labs  Lab 11/07/18 1549 11/07/18 2025 11/08/18 0004 11/08/18 0443 11/08/18 0804  GLUCAP 97 91 129* 120* 131*   Lipid Profile:  Recent Labs    11/06/18 0541  TRIG 141   Thyroid Function Tests: No results for input(s): TSH, T4TOTAL, FREET4, T3FREE, THYROIDAB in the last 72 hours. Anemia Panel: No results for input(s): VITAMINB12, FOLATE, FERRITIN, TIBC, IRON, RETICCTPCT in the last 72 hours. Urine analysis:    Component Value Date/Time   COLORURINE AMBER (A) 11/04/2018 1821   APPEARANCEUR HAZY (A) 11/04/2018 1821   LABSPEC >1.046 (H) 11/04/2018 1821   PHURINE 5.0 11/04/2018 1821   GLUCOSEU 50 (A) 11/04/2018 1821   HGBUR NEGATIVE 11/04/2018 1821   BILIRUBINUR NEGATIVE 11/04/2018 1821   KETONESUR 5 (A) 11/04/2018 1821   PROTEINUR 100 (A) 11/04/2018 1821   UROBILINOGEN 0.2 01/22/2010 1943   NITRITE NEGATIVE 11/04/2018 1821   LEUKOCYTESUR NEGATIVE 11/04/2018 1821   Sepsis Labs: _0 (procalcitonin:4,lacticidven:4)  ) Recent Results (from the past 240 hour(s))  SARS Coronavirus 2 [facility] order, Performed in [facility] hospital lab) Nasopharyngeal Nasopharyngeal Swab     Status: None   Collection Time: 11/03/18  3:36 PM   Specimen: Nasopharyngeal Swab  Result Value Ref Range Status   SARS Coronavirus 2 NEGATIVE NEGATIVE Final    Comment: (NOTE) If result is NEGATIVE SARS-CoV-2 target nucleic acids are NOT DETECTED. The SARS-CoV-2 RNA is generally detectable in upper and lower  respiratory specimens during the acute phase of infection. The lowest  concentration of SARS-CoV-2 viral copies this assay can detect is 250  copies / mL. A negative result does not preclude SARS-CoV-2 infection  and should not be used as the sole basis for treatment or other  patient management decisions.  A negative result may occur with  improper specimen collection / handling, submission of specimen other  than nasopharyngeal swab, presence of viral mutation(s) within the  areas targeted by this assay, and inadequate number of viral copies  (<250 copies / mL). A negative result must be combined with clinical   observations, patient history, and epidemiological information. If result is POSITIVE SARS-CoV-2 target nucleic acids are DETECTED. The SARS-CoV-2 RNA is generally detectable in upper and lower  respiratory specimens dur ing the acute phase of infection.  Positive  results are indicative of active infection with SARS-CoV-2.  Clinical  correlation with patient history and other diagnostic information is  necessary to determine patient infection status.  Positive results do  not rule out bacterial infection or co-infection with other viruses. If result is PRESUMPTIVE POSTIVE SARS-CoV-2 nucleic acids MAY BE PRESENT.   A presumptive positive result was obtained on the submitted specimen  and confirmed on repeat testing.  While 2019 novel coronavirus  (  SARS-CoV-2) nucleic acids may be present in the submitted sample  additional confirmatory testing may be necessary for epidemiological  and / or clinical management purposes  to differentiate between  SARS-CoV-2 and other Sarbecovirus currently known to infect humans.  If clinically indicated additional testing with an alternate test  methodology [phone]) is advised. The SARS-CoV-2 RNA is generally  detectable in upper and lower respiratory sp ecimens during the acute  phase of infection. The expected result is Negative. Fact Sheet for Patients:  StrictlyIdeas.no Fact Sheet for Healthcare Providers: BankingDealers.co.za This test is not yet approved or cleared by the [place] FDA and has been authorized for detection and/or diagnosis of SARS-CoV-2 by FDA under an Emergency Use Authorization (EUA).  This EUA will remain in effect (meaning this test can be used) for the duration of the COVID-19 declaration under Section 564(b)(1) of the Act, 21 U.S.C. section 360bbb-3(b)(1), unless the authorization is terminated or revoked sooner. Performed at [facility], [place] [address]  [place], [place] [address]   Culture, blood (routine x 2)     Status: None (Preliminary result)   Collection Time: 11/03/18  5:58 PM   Specimen: BLOOD  Result Value Ref Range Status   Specimen Description BLOOD LEFT ANTECUBITAL  Final   Special Requests   Final    BOTTLES DRAWN AEROBIC AND ANAEROBIC Blood Culture adequate volume   Culture   Final    NO GROWTH 4 DAYS Performed at [facility], [place] [address] [place], [place] [address]    Report Status PENDING  Incomplete  Culture, blood (routine x 2)     Status: None (Preliminary result)   Collection Time: 11/03/18  6:40 PM   Specimen: BLOOD LEFT HAND  Result Value Ref Range Status   Specimen Description BLOOD LEFT HAND  Final   Special Requests   Final    BOTTLES DRAWN AEROBIC AND ANAEROBIC Blood Culture results may not be optimal due to an inadequate volume of blood received in culture bottles   Culture   Final    NO GROWTH 4 DAYS Performed at [facility], [place] [address] [place], [place] [address]    Report Status PENDING  Incomplete  Culture, Urine     Status: None   Collection Time: 11/04/18  6:57 AM   Specimen: Urine, Random  Result Value Ref Range Status   Specimen Description URINE, RANDOM  Final   Special Requests NONE  Final   Culture   Final    NO GROWTH Performed at [facility], [place] [address], [place], [place] [address]    Report Status 11/05/2018 FINAL  Final         Radiology Studies: No results found.      Scheduled Meds: . aspirin EC  81 mg Oral Daily  . atorvastatin  80 mg Oral QHS  . Chlorhexidine Gluconate Cloth  6 each Topical Daily  . [START ON 11/12/2018] influenza vaccine adjuvanted  0.5 mL Intramuscular Tomorrow-1000  . insulin aspart  0-9 Units Subcutaneous Q4H  . metoprolol succinate  25 mg Oral Daily  . sodium chloride flush  10-40 mL Intracatheter Q12H  . sodium chloride flush  3 mL Intravenous Q12H   Continuous Infusions: . sodium chloride 35 mL/hr at 11/08/18  0500  . ertapenem 1,000 mg (11/07/18 0846)  . magnesium sulfate bolus IVPB 2 g (11/08/18 1030)  . TPN ADULT (ION) Stopped (11/08/18 0458)  . TPN ADULT (ION)  LOS: 5 days    Time spent: 28 minutes  Ishmael Holter, MD  [facility] Pager #: [phone] 7PM-7AM contact night coverage as above

## 2018-11-08 NOTE — TOC Initial Note (Signed)
Transition of Care [facility]) - Initial/Assessment Note    Patient Details  Name: Ray Brown MRN: [id number] Date of Birth: 10/31/1950  [facility]) CM/SW Contact:    Bethena Roys, RN Phone Number: 11/08/2018, 3:38 PM  Clinical Narrative:  Pt presented for nausea and vomiting-recurrent diverticulitis. Plan for [facility] and IV antibiotic therapy. Referral for IV antibiotics sent to [facility] with [place]. [facility] will supply the nursing. Wife to see if patient has DME RW in the room. Orders will need to be placed for services. CM will continue to follow for additional transition of care needs.                  Expected Discharge Plan: [place] Barriers to Discharge: No Barriers Identified   Patient Goals and CMS Choice Patient states their goals for this hospitalization and ongoing recovery are:: "to return home" CMS Medicare.gov Compare Post Acute Care list provided to:: Patient Choice offered to / list presented to : Patient  Expected Discharge Plan and Services Expected Discharge Plan: [place] In-house Referral: NA Discharge Planning Services: CM Consult Post Acute Care Choice: Home Health, Durable Medical Equipment Living arrangements for the past 2 months: Single Family Home                           HH Arranged: RN, Disease Management, PT, IV Antibiotics HH Agency: [facility] Date HH Agency Contacted: 11/08/18 Time Loop: 1537 Representative spoke with at Home: [place] Arrangements/Services Living arrangements for the past 2 months: [place] Lives with:: Spouse Patient language and need for interpreter reviewed:: Yes Do you feel safe going back to the place where you live?: Yes      Need for Family Participation in Patient Care: Yes (Comment) Care giver support system in place?: Yes  (comment) Current home services: DME(patient has cpap) Criminal Activity/Legal Involvement Pertinent to Current Situation/Hospitalization: No - Comment as needed  Activities of Daily Living      Permission Sought/Granted Permission sought to share information with : Family Supports, Chartered certified accountant granted to share information with : Yes, Verbal Permission Granted     Permission granted to share info w AGENCY: [facility]        Emotional Assessment Appearance:: Appears stated age Attitude/Demeanor/Rapport: Engaged Affect (typically observed): Accepting Orientation: : Oriented to Self, Oriented to Place, Oriented to  Time, Oriented to Situation Alcohol / Substance Use: Not Applicable Psych Involvement: No (comment)  Admission diagnosis:  Diverticulitis of large intestine with abscess without bleeding [K57.20] Intractable vomiting with nausea, unspecified vomiting type [R11.2] Diverticulitis of colon with perforation [K57.20] Patient Active Problem List   Diagnosis Date Noted  . Diverticulitis of colon with perforation 11/03/2018  . Dyslipidemia 11/03/2018  . OSA (obstructive sleep apnea) 11/03/2018  . Unstable angina ([place]) 12/05/2014  . Dyspnea 12/05/2014  . Dizziness 12/05/2014  . Diabetes mellitus with peripheral artery disease ([place]) 10/04/2013  . Coronary artery disease involving native coronary artery of native heart without angina pectoris 10/04/2013  . Impingement syndrome of right shoulder 03/28/2013  . Hyperlipemia 01/21/2013  . Obesity 01/21/2013  . Coronary artery disease   . Peripheral vascular disease ([place])    PCP:  [name], MD Pharmacy:   [id number] - [place], [place]  [place] [id number] Phone: [phone] Fax: [phone]  [place], [place] [facility] [address] [place] [address] [place] [address] Phone: [phone] Fax: [place], [place] - [address] [place] [address] Phone: [phone] Fax: [phone]     Social Determinants of Health (SDOH) Interventions    Readmission Risk Interventions No flowsheet data found.

## 2018-11-09 LAB — COMPREHENSIVE METABOLIC PANEL
ALT: 20 U/L (ref 0–44)
AST: 31 U/L (ref 15–41)
Albumin: 2.9 g/dL — ABNORMAL LOW (ref 3.5–5.0)
Alkaline Phosphatase: 83 U/L (ref 38–126)
Anion gap: 7 (ref 5–15)
BUN: 9 mg/dL (ref 8–23)
CO2: 23 mmol/L (ref 22–32)
Calcium: 9.2 mg/dL (ref 8.9–10.3)
Chloride: 106 mmol/L (ref 98–111)
Creatinine, Ser: 0.68 mg/dL (ref 0.61–1.24)
GFR calc Af Amer: >60 mL/min (ref >60)
GFR calc non Af Amer: >60 mL/min (ref >60)
Glucose, Bld: 145 mg/dL — ABNORMAL HIGH (ref 70–99)
Potassium: 4.7 mmol/L (ref 3.5–5.1)
Sodium: 136 mmol/L (ref 135–145)
Total Bilirubin: 0.6 mg/dL (ref 0.3–1.2)
Total Protein: 5.4 g/dL — ABNORMAL LOW (ref 6.5–8.1)

## 2018-11-09 LAB — GLUCOSE, CAPILLARY
Glucose-Capillary: 129 mg/dL — ABNORMAL HIGH (ref 70–99)
Glucose-Capillary: 140 mg/dL — ABNORMAL HIGH (ref 70–99)
Glucose-Capillary: 177 mg/dL — ABNORMAL HIGH (ref 70–99)
Glucose-Capillary: 173 mg/dL — ABNORMAL HIGH (ref 70–99)
Glucose-Capillary: 123 mg/dL — ABNORMAL HIGH (ref 70–99)

## 2018-11-09 LAB — CBC WITH DIFFERENTIAL/PLATELET
Abs Immature Granulocytes: 0.02 10*3/uL (ref 0.00–0.07)
Basophils Absolute: 0.1 10*3/uL (ref 0.0–0.1)
Basophils Relative: 1 %
Eosinophils Absolute: 0.3 10*3/uL (ref 0.0–0.5)
Eosinophils Relative: 4 %
HCT: 43.2 % (ref 39.0–52.0)
Hemoglobin: 14.4 g/dL (ref 13.0–17.0)
Immature Granulocytes: 0 %
Lymphocytes Relative: 19 %
Lymphs Abs: 1.6 10*3/uL (ref 0.7–4.0)
MCH: 31.9 pg (ref 26.0–34.0)
MCHC: 33.3 g/dL (ref 30.0–36.0)
MCV: 95.8 fL (ref 80.0–100.0)
Monocytes Absolute: 0.8 10*3/uL (ref 0.1–1.0)
Monocytes Relative: 9 %
Neutro Abs: 6 10*3/uL (ref 1.7–7.7)
Neutrophils Relative %: 67 %
Platelets: 219 10*3/uL (ref 150–400)
RBC: 4.51 MIL/uL (ref 4.22–5.81)
RDW: 12.6 % (ref 11.5–15.5)
WBC: 8.8 10*3/uL (ref 4.0–10.5)
nRBC: 0 % (ref 0.0–0.2)

## 2018-11-09 LAB — PHOSPHORUS: Phosphorus: 3.2 mg/dL (ref 2.5–4.6)

## 2018-11-09 LAB — MAGNESIUM: Magnesium: 1.5 mg/dL — ABNORMAL LOW (ref 1.7–2.4)

## 2018-11-09 MED ORDER — PANTOPRAZOLE SODIUM 40 MG PO TBEC
40.0000 mg | DELAYED_RELEASE_TABLET | Freq: Every day | ORAL | Status: DC
  Administered 2018-11-09: 40 mg via ORAL
  Filled 2018-11-09 (×2): qty 1

## 2018-11-09 MED ORDER — MAGNESIUM SULFATE 2 GM/50ML IV SOLN: 2.0000 g | Freq: Three times a day (TID) | INTRAVENOUS | Status: DC

## 2018-11-09 MED ORDER — ALUM & MAG HYDROXIDE-SIMETH 200-200-20 MG/5ML PO SUSP: 30.0000 mL | Freq: Four times a day (QID) | ORAL | Status: DC | PRN

## 2018-11-09 MED ORDER — ERTAPENEM IV (FOR PTA / DISCHARGE USE ONLY): 1.0000 g | INTRAVENOUS | 0 refills | Status: AC

## 2018-11-09 MED ORDER — SENNA 8.6 MG PO TABS: 1.0000 | ORAL_TABLET | Freq: Every day | ORAL | Status: DC | PRN

## 2018-11-09 MED ORDER — ENOXAPARIN SODIUM 40 MG/0.4ML ~~LOC~~ SOLN
40.0000 mg | SUBCUTANEOUS | Status: DC
  Administered 2018-11-09: 40 mg via SUBCUTANEOUS
  Filled 2018-11-09: qty 0.4

## 2018-11-09 MED ORDER — MAGNESIUM SULFATE 4 GM/100ML IV SOLN
4.0000 g | Freq: Once | INTRAVENOUS | Status: AC
  Administered 2018-11-09: 4 g via INTRAVENOUS
  Filled 2018-11-09: qty 100

## 2018-11-09 NOTE — Discharge Instructions (Signed)
Diverticulitis Complete antibiotics  Diverticulitis is when small pockets in your large intestine (colon) get infected or swollen. This causes stomach pain and watery poop (diarrhea). These pouches are called diverticula. They form in people who have a condition called diverticulosis. Follow these instructions at home: Medicines  Take over-the-counter and prescription medicines only as told by your doctor. These include: ? Antibiotics. ? Pain medicines. ? Fiber pills. ? Probiotics. ? Stool softeners.  Do not drive or use heavy machinery while taking prescription pain medicine.  If you were prescribed an antibiotic, take it as told. Do not stop taking it even if you feel better. General instructions   Follow a diet as told by your doctor.  When you feel better, your doctor may tell you to change your diet. You may need to eat a lot of fiber. Fiber makes it easier to poop (have bowel movements). Healthy foods with fiber include: ? Berries. ? Beans. ? Lentils. ? Green vegetables.  Exercise 3 or more times a week. Aim for 30 minutes each time. Exercise enough to sweat and make your heart beat faster.  Keep all follow-up visits as told. This is important. You may need to have an exam of the large intestine. This is called a colonoscopy. Contact a doctor if:  Your pain does not get better.  You have a hard time eating or drinking.  You are not pooping like normal. Get help right away if:  Your pain gets worse.  Your problems do not get better.  Your problems get worse very fast.  You have a fever.  You throw up (vomit) more than one time.  You have poop that is: ? Bloody. ? Black. ? Tarry. Summary  Diverticulitis is when small pockets in your large intestine (colon) get infected or swollen.  Take medicines only as told by your doctor.  Follow a diet as told by your doctor. This information is not intended to replace advice given to you by your health care  provider. Make sure you discuss any questions you have with your health care provider. Document Released: 07/27/2007 Document Revised: 01/20/2017 Document Reviewed: 02/25/2016 Elsevier Patient Education  2020 [employer].

## 2018-11-09 NOTE — [place] Notes (Addendum)
[id number] pain  Subjective: Continues to do well.  Tolerating diet well, full liquids, up to soft starting this AM.  I will wean off his TPN today and if doing well home in AM.  No BM so far.  Objective: Vital signs in last 24 hours: Temp:  [97.4 F (36.3 C)-98.2 F (36.8 C)] 98 F (36.7 C) (09/18 0413) Pulse Rate:  [59-63] 61 (09/18 0413) Resp:  [19] 19 (09/17 1606) BP: (117-131)/(61-82) 119/82 (09/18 0413) SpO2:  [94 %-98 %] 94 % (09/18 0413) Weight:  [80.7 kg] 80.7 kg (09/18 0413) Last BM Date: 11/05/18 2116 p.o. 600 IV 2780 urine No stool recorded Afebrile vital signs are stable No labs today Intake/Output from previous day: 09/17 0701 - 09/18 0700 In: 2724.5 [P.O.:2116; I.V.:508.5; IV Piggyback:100] Out: 2780 [Urine:2780] Intake/Output this shift: Total I/O In: -  Out: 350 [Urine:350]  General appearance: alert, cooperative, no distress and PT getting ready to walk him and using walker Resp: clear to auscultation bilaterally GI: soft, non-tender; bowel sounds normal; no masses,  no organomegaly  Lab Results:  Recent Labs    11/08/18 0505 11/09/18 0358  WBC 6.9 8.8  HGB 13.9 14.4  HCT 39.9 43.2  PLT 187 219    BMET Recent Labs    11/08/18 0505 11/09/18 0358  NA 139 136  K 4.2 4.7  CL 108 106  CO2 24 23  GLUCOSE 129* 145*  BUN 7* 9  CREATININE 0.58* 0.68  CALCIUM 8.7* 9.2   PT/INR No results for input(s): LABPROT, INR in the last 72 hours.  Recent Labs  Lab 11/06/18 0541 11/07/18 0500 11/07/18 0829 11/08/18 0505 11/09/18 0358  AST 38 29 26 25 31   ALT 16 13 15 15 20   ALKPHOS 60 59 65 74 83  BILITOT 1.3* 1.2 0.7 0.9 0.6  PROT 4.5* 4.3* 5.2* 5.2* 5.4*  ALBUMIN 2.6* 2.5* 2.7* 2.7* 2.9*     Lipase     Component Value Date/Time   LIPASE 25 03/24/2017 1857     Medications: . aspirin EC  81 mg Oral Daily  . atorvastatin  80 mg Oral QHS  . Chlorhexidine Gluconate Cloth  6 each Topical Daily  . [START ON 11/12/2018] influenza  vaccine adjuvanted  0.5 mL Intramuscular Tomorrow-1000  . insulin aspart  0-9 Units Subcutaneous Q4H  . metoprolol succinate  25 mg Oral Daily  . sodium chloride flush  10-40 mL Intracatheter Q12H  . sodium chloride flush  3 mL Intravenous Q12H    Assessment/Plan Sepsis secondary to diverticulitis - blood cultures pending AKI improving  - Creatinine 1.85>>1.46>>0.92(9/14)>0.61 (9/15) Hx CAD - Plavix/ASA on hold Hypertension Type II diabetes - diet controlled OSA - not on CPAP Hypomagnesemia - Mg 0.5 this AM Malnutrition -Pre-alb 14.7 Possible UTI - 21- 50 WBC/HPF (9/13)- No growth on urine culture COVID negative 11/03/18  Acute on chronic diverticulitiswith microperforation -Continue Invanz. WBC normalized.  - Continue NPO. Likely start CLD tomorrow if continues to do well. Some mild pain this AM.  - TPN - Hopefully will continue to improve and can be set up to have a single stage partial colectomy in the future. If he fails antibiotic therapy on the Invanz as he has the other antibiotics since June;he would most likely require a colostomy. - Needs to mobilize. Is deconditioned with a 30 lb weight loss. PT ordered.  FEN-TPN/full liquids to soft diet VTE-SCDs, okay for chemical prophylaxis from our standpoint. ID: Maxipime 9/12 X 2dose; invanx9/13 >>day6 Follow  up: TBD  Plan:  Continue Invanz thru 11/25/18. He has a PICC line for this. He will follow up with Dr. Johney Maine on 12/03/18.  Info is in the AVS, we will recheck labs again in AM.  Please send a copy of DC summary to Dr. Clyda Greener.    LOS: 6 days   [name],Ray 11/09/2018 [phone]  Agree with above. Looks good.  Alphonsa Overall, MD, [facility] Pager: [phone] Office phone:  [phone]

## 2018-11-09 NOTE — Progress Notes (Signed)
Physical Therapy Treatment Patient Details Name: Ray [name] MRN: [id number] DOB: May 30, 1950 Today's Date: 11/09/2018    History of Present Illness Mr. Scroggin is a 68 yo male with a history of T2DM, PVD, CAD, and recurrent diverticulitis who presented to the ED today with his wife for emesis.  He has had 30lb weight loss in 6 months.  He was found to have persistent diverticulitis with small abscess/fluid consistent with localized perforation.    PT Comments    Pt in bed on entry, slightly irritated about differing information about discharge day. Agreeable to stair training so that he will be ready whether it is today or tomorrow. Pt continues to be limited in safe mobility by BPPV but is able to decrease symptomology with movement when practicing gaze stabilization. Pt is supervision for bed mobility, and transfers and min guard for ambulation of 350 feet with RW and ascent/descent of  2 steps x 3 with no increase in dizziness. D/c plans remain appropriate at this time. PT will continue to follow acutely.   Follow Up Recommendations  Outpatient PT     Equipment Recommendations  Rolling walker with 5" wheels       Precautions / Restrictions Precautions Precautions: Fall Restrictions Weight Bearing Restrictions: No    Mobility  Bed Mobility Overal bed mobility: Needs Assistance Bed Mobility: Supine to Sit;Sit to Supine     Supine to sit: Supervision Sit to supine: Supervision      Transfers Overall transfer level: Needs assistance Equipment used: Rolling walker (2 wheeled) Transfers: Sit to/from Stand Sit to Stand: Supervision         General transfer comment: able to stand unaided, with vc able to push up from bed to RW  Ambulation/Gait Ambulation/Gait assistance: Min guard Gait Distance (Feet): 350 Feet Assistive device: Rolling walker (2 wheeled) Gait Pattern/deviations: Step-through pattern;Step-to pattern;Decreased stride length Gait velocity:  slowed Gait velocity interpretation: <1.8 ft/sec, indicate of risk for recurrent falls General Gait Details: slow pace with cues for compensation, 1x bout of dizziness from turning corner without practicing gaze stabilization, able to perform rest of ambulation without dizziness requiring him to stop   Stairs Stairs: Yes Stairs assistance: Min guard Stair Management: One rail Left;Backwards;Forwards Number of Stairs: 2(2 stepx3) General stair comments: min guard for safety, due to IV pole ascend forward, descend backwards up 2 steps 3x, with practice of gaze stabilizaton able to perform without increase in dizziness       Balance Overall balance assessment: Needs assistance   Sitting balance-Leahy Scale: Good Sitting balance - Comments: able to complete gaze stabilization exercises in sitting without LOB   Standing balance support: Bilateral upper extremity supported Standing balance-Leahy Scale: Fair Standing balance comment: able to statically stand without support                            Cognition Arousal/Alertness: Awake/alert Behavior During Therapy: WFL for tasks assessed/performed Overall Cognitive Status: Within Functional Limits for tasks assessed                                           General Comments General comments (skin integrity, edema, etc.): VSS, reports he has been practicing gaze stabilization       Pertinent Vitals/Pain Pain Assessment: No/denies pain           PT Goals (  current goals can now be found in the care plan section) Acute Rehab PT Goals PT Goal Formulation: With patient/family Time For Goal Achievement: 11/20/18 Potential to Achieve Goals: Good Progress towards PT goals: Progressing toward goals    Frequency    Min 3X/week      PT Plan Current plan remains appropriate       AM-PAC PT "6 Clicks" Mobility   Outcome Measure  Help needed turning from your back to your side while in a flat bed  without using bedrails?: None Help needed moving from lying on your back to sitting on the side of a flat bed without using bedrails?: A Little Help needed moving to and from a bed to a chair (including a wheelchair)?: A Little Help needed standing up from a chair using your arms (e.g., wheelchair or bedside chair)?: None Help needed to walk in hospital room?: A Little Help needed climbing 3-5 steps with a railing? : A Little 6 Click Score: 20    End of Session Equipment Utilized During Treatment: Gait belt Activity Tolerance: Patient tolerated treatment well Patient left: in bed;with call bell/phone within reach;with family/visitor present Nurse Communication: Mobility status PT Visit Diagnosis: Other abnormalities of gait and mobility (R26.89);Dizziness and giddiness (R42);BPPV BPPV - Right/Left : Left     Time: [id number] PT Time Calculation (min) (ACUTE ONLY): 30 min  Charges:  $Gait Training: 23-37 mins                     Xavior Niazi B. Migdalia Dk PT, DPT Acute Rehabilitation Services Pager [phone] Office [phone]    [place] 11/09/2018, 10:48 AM

## 2018-11-09 NOTE — Progress Notes (Signed)
To MD discharging pt. Kindly address pt /family question regarding insulin intake while pt is recovering at home. Do they continue or do they not and who is going to administer them while at home.

## 2018-11-09 NOTE — Progress Notes (Signed)
PROGRESS NOTE    Ray [name]  [id number] DOB: November 06, 1950 DOA: 11/03/2018 PCP: [name], MD  Outpatient Specialists:   Brief Narrative:  Ray [name] is a 68 y.o. male with medical history significant of OSA not on CPAP; PVD; HLD; DM; CAD; diverticulosis; and chronic back pain.  Patient presented with nausea and vomiting.  Found to have severe diverticulitis with microperforation and small diverticular abscess.  Reported 30 pound weight loss.  Apparently, patient has had recurrent diverticulitis, followed by [facility] since June, several rounds of abx, most recently in early August.  Patient was admitted by hospitalist service.  Started on antibiotics which were switched to [place] on 11/04/2018.  GI was consulted and surgery was consulted as well.  Surgery has discussed possible surgical intervention either during this hospitalization or in future as outpatient based on how he responds to IV antibiotics.  Patient then developed some vertigo.  Had CT head and then MRI of the brain without contrast followed by MRI of the brain with contrast which showed 1. Post-contrast MRI confirms generalized pachymeningeal thickening and enhancement as the dural abnormality on the earlier noncontrast exam. No associated leptomeningeal enhancement. No abnormal parenchymal enhancement.  2. Diffuse smooth dural thickening is nonspecific has a fairly broad differential including due to: Intracranial hypotension, recent lumbar puncture, prior intracranial surgery, prior subdural hematoma, inflammatory pachymeningitis, or less likely infectious Meningitis. Patient was diagnosed to have BPPV.  PT was consulted and he improved with Epley maneuvers.  Subsequently, his pain improved.  He was started on clear liquid diet by general surgery which was then advanced to soft diet.  Assessment & Plan:   Principal Problem:   Diverticulitis of colon with perforation Active Problems:   Diabetes mellitus with  peripheral artery disease (HCC)   Coronary artery disease involving native coronary artery of native heart without angina pectoris   Dyslipidemia   OSA (obstructive sleep apnea)  Sepsis secondary to diverticulitis with microperforation and abscess    he has severe sigmoid diverticulosis with wall thickening and adjacent inflammatory fat stranding with extraluminal air on repeated imaging on 6/9, 7/8, 8/10, and 11/03/2018.  He met sepsis criteria based on leukocytosis, tachycardia and tachypnea.  Sepsis has resolved at this point in time.  Blood culture negative so far. -No more abdominal pain, nausea or vomiting.  Tolerating clears.  Will advance to soft diet per general surgery recommendations. Continue Invanz.  Work in progress to make arrangements for him to get Invanz at home through 11/25/2018  Acute kidney injury: -Serum creatinine had peaked to 1.85.  This was likely prerenal.  This has resolved.  Renal ultrasound unremarkable.  Severe hypomagnesemia: 1.5 today.  Will replace with magnesium sulfate twice today.  Mild hypophosphatemia: Resolved.  Vertigo/BPPV: This is started approximately 2 days ago.  He ended up having CT head followed by MRI brain without contrast and now MRI brain with contrast with impression as below 1. Post-contrast MRI confirms generalized pachymeningeal thickening and enhancement as the dural abnormality on the earlier noncontrast exam. No associated leptomeningeal enhancement. No abnormal parenchymal enhancement.  2. Diffuse smooth dural thickening is nonspecific has a fairly broad differential including due to: Intracranial hypotension, recent lumbar puncture, prior intracranial surgery, prior subdural hematoma, inflammatory pachymeningitis, or less likely infectious Meningitis. Feels better.  No more nystagmus.  Continue PT vestibular therapy.  H/o CAD -Hold ASA and Plavix for now for possible need for surgery.  No signs of ACS.  HTN -Blood  pressure very well controlled.  On IV Lopressor as needed.  DM -Diet controlled.  Last hemoglobin A1c 6.5.  Continue SSI.  HLD -Continue Lipitor -Hold Niaspan and Fenofibrate as these are unlikely to have significant utility during hospitalization  OSA -Does not wear CPAP  Note: This patient has been tested and is negative for the novel coronavirus COVID-19.  DVT prophylaxis:SCDs Code Status:Full - confirmed with patient/family Family Communication:No family present at bedside.  Plan of care discussed with the patient and he verbalized understanding. Disposition Plan:Most likely home with IV antibiotics tomorrow. Consults called:GI; surgery  Procedures:   None  Antimicrobials:   IV ertapenem   Subjective: Seen and examined.  Continues to feel good.  No abdominal pain nausea or vomiting.  Tolerating full liquid.  Objective: Vitals:   11/08/18 1606 11/08/18 1953 11/09/18 0413 11/09/18 1008  BP: 122/69 131/68 119/82 117/68  Pulse: (!) 59 63 61 74  Resp: 19     Temp: (!) 97.4 F (36.3 C) 98.2 F (36.8 C) 98 F (36.7 C)   TempSrc: Oral Oral    SpO2: 97% 98% 94%   Weight:   80.7 kg   Height:        Intake/Output Summary (Last 24 hours) at 11/09/2018 1116 Last data filed at 11/09/2018 0733 Gross per 24 hour  Intake 1888.49 ml  Output 2075 ml  Net -186.51 ml   Filed Weights   11/07/18 0519 11/08/18 0431 11/09/18 0413  Weight: 80.3 kg 80.4 kg 80.7 kg    Examination:  General exam: Appears calm and comfortable  Respiratory system: Clear to auscultation. Respiratory effort normal. Cardiovascular system: S1 & S2 heard, RRR. No JVD, murmurs, rubs, gallops or clicks. No pedal edema. Gastrointestinal system: Abdomen is nondistended, soft and nontender. No organomegaly or masses felt. Normal bowel sounds heard. Central nervous system: Alert and oriented. No focal neurological deficits. Extremities: Symmetric 5 x 5 power. Skin: No rashes, lesions or  ulcers.  Psychiatry: Judgement and insight appear normal. Mood & affect appropriate.   Data Reviewed: I have personally reviewed following labs and imaging studies  CBC: Recent Labs  Lab 11/05/18 0626 11/06/18 0541 11/07/18 0500 11/08/18 0505 11/09/18 0358  WBC 12.9* 8.6 7.8 6.9 8.8  NEUTROABS  --  6.2 5.6 4.3 6.0  HGB 13.7 12.6* 12.9* 13.9 14.4  HCT 41.4 37.3* 38.6* 39.9 43.2  MCV 96.3 97.4 98.0 95.0 95.8  PLT 230 197 206 187 332   Basic Metabolic Panel: Recent Labs  Lab 11/06/18 0541 11/06/18 0542 11/07/18 0500 11/07/18 0829 11/08/18 0505 11/09/18 0358  NA 142  --  132* 138 139 136  K 3.9  --  6.1* 4.1 4.2 4.7  CL 111  --  104 110 108 106  CO2 22  --  _0 GLUCOSE 79  --  492* 137* 129* 145*  BUN 17  --  11 10 7* 9  CREATININE 0.61  --  0.58* 0.58* 0.58* 0.68  CALCIUM 7.6*  --  8.1* 8.5* 8.7* 9.2  MG 1.8  --  2.2 1.6* 1.6* 1.5*  PHOS 2.0* 2.0* 4.2 2.1* 2.8 3.2   GFR: Estimated Creatinine Clearance: 84.3 mL/min (by C-G formula based on SCr of 0.68 mg/dL). Liver Function Tests: Recent Labs  Lab 11/06/18 0541 11/07/18 0500 11/07/18 0829 11/08/18 0505 11/09/18 0358  AST 38 _1 ALT _2 ALKPHOS 60 59 65 74 83  BILITOT 1.3* 1.2 0.7 0.9 0.6  PROT 4.5* 4.3* 5.2*  5.2* 5.4*  ALBUMIN 2.6* 2.5* 2.7* 2.7* 2.9*   No results for input(s): LIPASE, AMYLASE in the last 168 hours. No results for input(s): AMMONIA in the last 168 hours. Coagulation Profile: No results for input(s): INR, PROTIME in the last 168 hours. Cardiac Enzymes: No results for input(s): CKTOTAL, CKMB, CKMBINDEX, TROPONINI in the last 168 hours. BNP (last 3 results) No results for input(s): PROBNP in the last 8760 hours. HbA1C: No results for input(s): HGBA1C in the last 72 hours. CBG: Recent Labs  Lab 11/08/18 1715 11/08/18 1955 11/08/18 2338 11/09/18 0409 11/09/18 0759  GLUCAP 146* 121* 112* 129* 140*   Lipid Profile: No results for input(s): CHOL, HDL,  LDLCALC, TRIG, CHOLHDL, LDLDIRECT in the last 72 hours. Thyroid Function Tests: No results for input(s): TSH, T4TOTAL, FREET4, T3FREE, THYROIDAB in the last 72 hours. Anemia Panel: No results for input(s): VITAMINB12, FOLATE, FERRITIN, TIBC, IRON, RETICCTPCT in the last 72 hours. Urine analysis:    Component Value Date/Time   COLORURINE AMBER (A) 11/04/2018 1821   APPEARANCEUR HAZY (A) 11/04/2018 1821   LABSPEC >1.046 (H) 11/04/2018 1821   PHURINE 5.0 11/04/2018 1821   GLUCOSEU 50 (A) 11/04/2018 1821   HGBUR NEGATIVE 11/04/2018 1821   BILIRUBINUR NEGATIVE 11/04/2018 1821   KETONESUR 5 (A) 11/04/2018 1821   PROTEINUR 100 (A) 11/04/2018 1821   UROBILINOGEN 0.2 01/22/2010 1943   NITRITE NEGATIVE 11/04/2018 1821   LEUKOCYTESUR NEGATIVE 11/04/2018 1821   Sepsis Labs: _0 (procalcitonin:4,lacticidven:4)  ) Recent Results (from the past 240 hour(s))  SARS Coronavirus 2 [facility] order, Performed in [facility] hospital lab) Nasopharyngeal Nasopharyngeal Swab     Status: None   Collection Time: 11/03/18  3:36 PM   Specimen: Nasopharyngeal Swab  Result Value Ref Range Status   SARS Coronavirus 2 NEGATIVE NEGATIVE Final    Comment: (NOTE) If result is NEGATIVE SARS-CoV-2 target nucleic acids are NOT DETECTED. The SARS-CoV-2 RNA is generally detectable in upper and lower  respiratory specimens during the acute phase of infection. The lowest  concentration of SARS-CoV-2 viral copies this assay can detect is 250  copies / mL. A negative result does not preclude SARS-CoV-2 infection  and should not be used as the sole basis for treatment or other  patient management decisions.  A negative result may occur with  improper specimen collection / handling, submission of specimen other  than nasopharyngeal swab, presence of viral mutation(s) within the  areas targeted by this assay, and inadequate number of viral copies  (<250 copies / mL). A negative result must be combined with  clinical  observations, patient history, and epidemiological information. If result is POSITIVE SARS-CoV-2 target nucleic acids are DETECTED. The SARS-CoV-2 RNA is generally detectable in upper and lower  respiratory specimens dur ing the acute phase of infection.  Positive  results are indicative of active infection with SARS-CoV-2.  Clinical  correlation with patient history and other diagnostic information is  necessary to determine patient infection status.  Positive results do  not rule out bacterial infection or co-infection with other viruses. If result is PRESUMPTIVE POSTIVE SARS-CoV-2 nucleic acids MAY BE PRESENT.   A presumptive positive result was obtained on the submitted specimen  and confirmed on repeat testing.  While 2019 novel coronavirus  (SARS-CoV-2) nucleic acids may be present in the submitted sample  additional confirmatory testing may be necessary for epidemiological  and / or clinical management purposes  to differentiate between  SARS-CoV-2 and other Sarbecovirus currently known to infect humans.  If  clinically indicated additional testing with an alternate test  methodology [phone]) is advised. The SARS-CoV-2 RNA is generally  detectable in upper and lower respiratory sp ecimens during the acute  phase of infection. The expected result is Negative. Fact Sheet for Patients:  StrictlyIdeas.no Fact Sheet for Healthcare Providers: BankingDealers.co.za This test is not yet approved or cleared by the [place] FDA and has been authorized for detection and/or diagnosis of SARS-CoV-2 by FDA under an Emergency Use Authorization (EUA).  This EUA will remain in effect (meaning this test can be used) for the duration of the COVID-19 declaration under Section 564(b)(1) of the Act, 21 U.S.C. section 360bbb-3(b)(1), unless the authorization is terminated or revoked sooner. Performed at [facility], [place]  [address] [place], [place] [address]   Culture, blood (routine x 2)     Status: None   Collection Time: 11/03/18  5:58 PM   Specimen: BLOOD  Result Value Ref Range Status   Specimen Description BLOOD LEFT ANTECUBITAL  Final   Special Requests   Final    BOTTLES DRAWN AEROBIC AND ANAEROBIC Blood Culture adequate volume   Culture   Final    NO GROWTH 5 DAYS Performed at [facility], [place] [address], [place], [place] [address]    Report Status 11/08/2018 FINAL  Final  Culture, blood (routine x 2)     Status: None   Collection Time: 11/03/18  6:40 PM   Specimen: BLOOD LEFT HAND  Result Value Ref Range Status   Specimen Description BLOOD LEFT HAND  Final   Special Requests   Final    BOTTLES DRAWN AEROBIC AND ANAEROBIC Blood Culture results may not be optimal due to an inadequate volume of blood received in culture bottles   Culture   Final    NO GROWTH 5 DAYS Performed at [facility], [place] [address], [place], [place] [address]    Report Status 11/08/2018 FINAL  Final  Culture, Urine     Status: None   Collection Time: 11/04/18  6:57 AM   Specimen: Urine, Random  Result Value Ref Range Status   Specimen Description URINE, RANDOM  Final   Special Requests NONE  Final   Culture   Final    NO GROWTH Performed at [facility], [place] [address] [place], [place] [address]    Report Status 11/05/2018 FINAL  Final     Radiology Studies: No results found.   Scheduled Meds: . aspirin EC  81 mg Oral Daily  . atorvastatin  80 mg Oral QHS  . Chlorhexidine Gluconate Cloth  6 each Topical Daily  . enoxaparin (LOVENOX) injection  40 mg Subcutaneous Q24H  . [START ON 11/12/2018] influenza vaccine adjuvanted  0.5 mL Intramuscular Tomorrow-1000  . insulin aspart  0-9 Units Subcutaneous Q4H  . metoprolol succinate  25 mg Oral Daily  . sodium chloride flush  10-40 mL Intracatheter Q12H  . sodium chloride flush  3 mL Intravenous Q12H   Continuous Infusions: . ertapenem 1,000 mg  (11/09/18 1012)  . magnesium sulfate bolus IVPB 4 g (11/09/18 1012)  . TPN ADULT (ION) 40 mL/hr at 11/09/18 0600     LOS: 6 days   Time spent: 30 minutes  Ishmael Holter, MD  [facility] Pager #: [phone] 7PM-7AM contact night coverage as above

## 2018-11-09 NOTE — Care Management Important Message (Signed)
Important Message  Patient Details  Name: Ray [name] MRN: [id number] Date of Birth: Aug 16, 1950   Medicare Important Message Given:  Yes     Shelda Altes 11/09/2018, 3:22 PM

## 2018-11-09 NOTE — TOC Progression Note (Signed)
Transition of Care [facility]) - Progression Note    Patient Details  Name: Ray [name] MRN: [id number] Date of Birth: [birth year]  Transition of Care [facility]) CM/SW Contact  Graves-Bigelow, Ocie Cornfield, RN Phone Number: 11/09/2018, 3:17 PM  Clinical Narrative:  DME RW to be delivered to room via [facility]- [place] RN to be via [facility]- [facility] unable to staff 2/2 insurance. Outpatient ambulatory referral for PT sent to [facility]. Patient aware to begin outpatient PT once the [facility] IV infusion is completed.  No further needs from CM at this time.   Expected Discharge Plan: [place] Barriers to Discharge: No Barriers Identified  Expected Discharge Plan and Services Expected Discharge Plan: [place] In-house Referral: NA Discharge Planning Services: CM Consult Post Acute Care Choice: Home Health, Durable Medical Equipment Living arrangements for the past 2 months: Single Family Home                 DME Arranged: Walker rolling DME Agency: [employer] Date DME Agency Contacted: 11/09/18 Time DME Agency Contacted: 65 Representative spoke with at DME Agency: [facility] HH Arranged: RN, Disease Management, IV Antibiotics HH Agency: Other - See comment Date [place]: 11/09/18 Time [place]: [place] Representative spoke with at [place]: [place] with [facility] IV infusion.   Social Determinants of Health (SDOH) Interventions    Readmission Risk Interventions No flowsheet data found.

## 2018-11-09 NOTE — Progress Notes (Signed)
PHARMACY CONSULT NOTE FOR:  OUTPATIENT  PARENTERAL ANTIBIOTIC THERAPY (OPAT)  Indication: diverticulitis Regimen: ertapenem 1g IV q24h End date: 11/25/2018  IV antibiotic discharge orders are pended. To discharging provider:  please sign these orders via discharge navigator,  Select New Orders & click on the button choice - Manage This Unsigned Work.     Thank you for allowing pharmacy to be a part of this patient's care.  Candie Mile 11/09/2018, 11:01 AM

## 2018-11-10 DIAGNOSIS — H8110 Benign paroxysmal vertigo, unspecified ear: Secondary | ICD-10-CM

## 2018-11-10 LAB — CBC WITH DIFFERENTIAL/PLATELET
Abs Immature Granulocytes: 0.04 10*3/uL (ref 0.00–0.07)
Basophils Absolute: 0 10*3/uL (ref 0.0–0.1)
Basophils Relative: 0 %
Eosinophils Absolute: 0.4 10*3/uL (ref 0.0–0.5)
Eosinophils Relative: 3 %
HCT: 43.8 % (ref 39.0–52.0)
Hemoglobin: 15.3 g/dL (ref 13.0–17.0)
Immature Granulocytes: 0 %
Lymphocytes Relative: 18 %
Lymphs Abs: 2 10*3/uL (ref 0.7–4.0)
MCH: 32.7 pg (ref 26.0–34.0)
MCHC: 34.9 g/dL (ref 30.0–36.0)
MCV: 93.6 fL (ref 80.0–100.0)
Monocytes Absolute: 1 10*3/uL (ref 0.1–1.0)
Monocytes Relative: 8 %
Neutro Abs: 8.1 10*3/uL — ABNORMAL HIGH (ref 1.7–7.7)
Neutrophils Relative %: 71 %
Platelets: 237 10*3/uL (ref 150–400)
RBC: 4.68 MIL/uL (ref 4.22–5.81)
RDW: 12.6 % (ref 11.5–15.5)
WBC: 11.5 10*3/uL — ABNORMAL HIGH (ref 4.0–10.5)
nRBC: 0 % (ref 0.0–0.2)

## 2018-11-10 LAB — COMPREHENSIVE METABOLIC PANEL
ALT: 22 U/L (ref 0–44)
AST: 25 U/L (ref 15–41)
Albumin: 2.9 g/dL — ABNORMAL LOW (ref 3.5–5.0)
Alkaline Phosphatase: 105 U/L (ref 38–126)
Anion gap: 8 (ref 5–15)
BUN: 14 mg/dL (ref 8–23)
CO2: 25 mmol/L (ref 22–32)
Calcium: 9.2 mg/dL (ref 8.9–10.3)
Chloride: 103 mmol/L (ref 98–111)
Creatinine, Ser: 0.75 mg/dL (ref 0.61–1.24)
GFR calc Af Amer: >60 mL/min (ref >60)
GFR calc non Af Amer: >60 mL/min (ref >60)
Glucose, Bld: 126 mg/dL — ABNORMAL HIGH (ref 70–99)
Potassium: 4.4 mmol/L (ref 3.5–5.1)
Sodium: 136 mmol/L (ref 135–145)
Total Bilirubin: 0.5 mg/dL (ref 0.3–1.2)
Total Protein: 5.7 g/dL — ABNORMAL LOW (ref 6.5–8.1)

## 2018-11-10 LAB — PHOSPHORUS: Phosphorus: 3.3 mg/dL (ref 2.5–4.6)

## 2018-11-10 LAB — GLUCOSE, CAPILLARY
Glucose-Capillary: 111 mg/dL — ABNORMAL HIGH (ref 70–99)
Glucose-Capillary: 135 mg/dL — ABNORMAL HIGH (ref 70–99)
Glucose-Capillary: 143 mg/dL — ABNORMAL HIGH (ref 70–99)

## 2018-11-10 LAB — MAGNESIUM: Magnesium: 1.5 mg/dL — ABNORMAL LOW (ref 1.7–2.4)

## 2018-11-10 MED ORDER — INFLUENZA VAC A&B SA ADJ QUAD 0.5 ML IM PRSY
0.5000 mL | PREFILLED_SYRINGE | Freq: Once | INTRAMUSCULAR | Status: AC
  Administered 2018-11-10: 0.5 mL via INTRAMUSCULAR
  Filled 2018-11-10: qty 0.5

## 2018-11-10 NOTE — Discharge Summary (Signed)
Physician Discharge Summary  Ray Brown QVZ:[id number] DOB: 1950-08-10 DOA: 11/03/2018  PCP: [name], MD  Admit date: 11/03/2018 Discharge date: 11/10/2018  Admitted From: Home Disposition: Home  Recommendations for Outpatient Follow-up:  1. Follow up with PCP in 1-2 weeks 2. Follow-up with general surgery per their recommendations within 1 to 2 weeks 3. Please obtain BMP/CBC in one week 4. Please follow up on the following pending results:  Home Health: Yes Equipment/Devices: Rolling walker  Discharge Condition: Stable CODE STATUS: Full code Diet recommendation: Soft diet  Subjective: Patient seen and examined.  No complaints.  No abdominal pain or nausea.  Vertigo has improved significantly.  Brief/Interim Summary: Ray Brown a 68 y.o.malewith medical history significant ofOSA not on CPAP; PVD; HLD; DM; CAD; diverticulosis; and chronic back pain.  Patient presented with nausea and vomiting.  Found to have severe diverticulitis with microperforation and small diverticular abscess.  Reported 30 pound weight loss.  Apparently, patient has had recurrent diverticulitis, followed by [facility] since June, several rounds of abx, most recently in early August.  Patient was admitted by hospitalist service.  Started on antibiotics which were switched to [place] on 11/04/2018.  GI was consulted and surgery was consulted as well.  Surgery has discussed possible surgical intervention either during this hospitalization or in future as outpatient based on how he responds to IV antibiotics.  Patient then developed some vertigo.  Had CT head and then MRI of the brain without contrast followed by MRI of the brain with contrast which showed 1. Post-contrast MRI confirms generalized pachymeningeal thickening and enhancement as the dural abnormality on the earlier noncontrast exam. No associated leptomeningeal enhancement. No abnormal parenchymal enhancement.  2. Diffuse smooth dural  thickening is nonspecific has a fairly broad differential including due to: Intracranial hypotension, recent lumbar puncture, prior intracranial surgery, prior subdural hematoma, inflammatory pachymeningitis, or less likely infectious Meningitis.  Patient was diagnosed to have BPPV.  PT was consulted and he improved with Epley maneuvers.  Subsequently, his pain improved.  He was started on clear liquid diet by general surgery which was then advanced to soft diet and he tolerated that as well.  His TPN was stopped yesterday and he did well for 24 hours as well.  He continues to be on Invanz and surgery recommends continuing that until 11/25/2018 and then he has a scheduled appointment to see them week after that as outpatient.  He has been cleared by general surgery to be discharged and he is medically stable for discharge today as well.  He has outpatient PT referral for further Epley maneuvers and treatment of his BPPV.  Discharge Diagnoses:  Principal Problem:   Diverticulitis of colon with perforation Active Problems:   Diabetes mellitus with peripheral artery disease (HCC)   Coronary artery disease involving native coronary artery of native heart without angina pectoris   Dyslipidemia   OSA (obstructive sleep apnea)   BPPV (benign paroxysmal positional vertigo)    Discharge Instructions  Discharge Instructions    Ambulatory referral to Physical Therapy   Complete by: As directed    Evaluation and treatment for vertigo gate and balance.   Discharge patient   Complete by: As directed    Discharge disposition: 01-Home or Self Care   Discharge patient date: 11/10/2018   Home infusion instructions [name] follow [place] Dosing Protocol; May administer Cathflo as needed to maintain patency of vascular access device.; Flushing of vascular access device: per [facility] Protocol: 0.9% NaCl  pre/post medica...   Complete by: As directed    Instructions: May follow [place]  Dosing Protocol   Instructions: May administer Cathflo as needed to maintain patency of vascular access device.   Instructions: Flushing of vascular access device: per [facility] Protocol: 0.9% NaCl pre/post medication administration and prn patency; Heparin 100 u/ml, 52m for implanted ports and Heparin 10u/ml, 571mfor all other central venous catheters.   Instructions: May follow [facility] Anaphylaxis Protocol for First Dose Administration in the home: 0.9% NaCl at 25-50 ml/hr to maintain IV access for protocol meds. Epinephrine 0.3 ml IV/IM PRN and Benadryl 25-50 IV/IM PRN s/s of anaphylaxis.   Instructions: [facility] Coordinator (RN) to assist per patient IV care needs in the home PRN.     Allergies as of 11/10/2018      Reactions   Ciprofloxacin Rash   Daypro [oxaprozin] Rash      Medication List    TAKE these medications   aspirin EC 81 MG tablet Take 81 mg by mouth at bedtime.   atorvastatin 80 MG tablet Commonly known as: LIPITOR Take 1 tablet (80 mg total) by mouth at bedtime. Please keep upcoming appt for future refills. Thank you   clopidogrel 75 MG tablet Commonly known as: PLAVIX Take 1 tablet (75 mg total) by mouth at bedtime. Please keep upcoming appt for future refills. Thank you   ertapenem  IVPB Commonly known as: INVANZ Inject 1 g into the vein daily for 14 days. Indication:  Diverticulitis Last Day of Therapy:  11/25/2018 Labs - Once weekly:  CBC/D and BMP, Labs - Every other week:  ESR and CRP   fenofibrate 160 MG tablet Take 160 mg by mouth at bedtime.   metoprolol succinate 25 MG 24 hr tablet Commonly known as: TOPROL-XL TAKE 1 TABLET BY MOUTH  DAILY   niacin 750 MG CR tablet Commonly known as: NIASPAN Take 1,500 mg by mouth at bedtime.   nitroGLYCERIN 0.4 MG SL tablet Commonly known as: NITROSTAT Place 0.4 mg under the tongue every 5 (five) minutes as needed. For chest pain   pantoprazole 40 MG tablet Commonly known as: PROTONIX Take 40 mg  by mouth at bedtime.   traMADol 50 MG tablet Commonly known as: ULTRAM Take 50 mg by mouth every 12 (twelve) hours as needed for moderate pain.            Home Infusion Instuctions  (From admission, onward)         Start     Ordered   11/09/18 0000  Home infusion instructions [facility] May follow [facility] Protocol; May administer Cathflo as needed to maintain patency of vascular access device.; Flushing of vascular access [facility]: 0.9% NaCl pre/post medica...    Question Answer Comment  Instructions May follow [place] [place] Protocol   Instructions May administer Cathflo as needed to maintain patency of vascular access device.   Instructions Flushing of vascular access device: [facility]: 0.9% NaCl pre/post medication administration and prn patency; Heparin 100 u/ml, 1m54mor implanted ports and Heparin 10u/ml, 1ml73mr all other central venous catheters.   Instructions May follow [facility] Anaphylaxis Protocol for First Dose Administration in the home: 0.9% NaCl at 25-50 ml/hr to maintain IV access for protocol meds. Epinephrine 0.3 ml IV/IM PRN and Benadryl 25-50 IV/IM PRN s/s of anaphylaxis.   Instructions [facility] Infusion Coordinator (RN) to assist per patient IV care needs in the home PRN.  11/09/18 [place]  (From admission, onward)         Start     Ordered   11/09/18 0741  For home use only DME Walker rolling  Once    Question:  Patient needs a walker to treat with the following condition  Answer:  Balance problem   11/09/18 0740         Follow-up Information    [name], MD Follow up on 12/03/2018.   Specialty: General Surgery Why: Your appointment is at 10:15 AM.  Be at the office 30 minutes early for check in.  Bring photo ID and insurance information.   Contact information: [address] [place] [address] [place]  [facility] Follow up.   Why: Best boy information: [place] [address] [phone]        [facility], [facility] Follow up.   Specialty: [place] Why: Infusion company to assist with IV Invanz only and [place] to be provided by [facility].        [place] Follow up.   Specialty: Rehabilitation Why: Office will call you within 2-3 business day regarding follow up appointment- Please make the office aware of when IV antibiotics will be completed.  Contact information: [address] [place] [id number] [place] [address] [phone]       [name], MD Follow up in 1 week(s).   Specialty: Family Medicine Contact information: [address] [place] [address] [phone]        [name], MD .   Specialty: Cardiology Contact information: [phone] [place] [address] [phone]          Allergies  Allergen Reactions  . Ciprofloxacin Rash  . Daypro [Oxaprozin] Rash    Consultations: General surgery   Procedures/Studies: Dg Abd 1 View  Result Date: 11/04/2018 CLINICAL DATA:  Possible colonic diverticulitis. EXAM: ABDOMEN - 1 VIEW COMPARISON:  CT 11/03/2018 FINDINGS: Bowel gas pattern is nonobstructive. No free peritoneal air. Contrast present over the bladder from recent CT scan. Right iliac stent. Degenerative changes spine with fusion hardware at the L2-3 level. IMPRESSION: Nonobstructive bowel gas pattern. Electronically Signed   By: Marin Olp M.D.   On: 11/04/2018 09:22   Mr Brain Wo Contrast  Addendum Date: 11/05/2018   ADDENDUM REPORT: 11/05/2018 20:41 ADDENDUM: Study discussed by telephone with PA Tylene Fantasia on 11/05/2018 at 2037 hours. Electronically Signed   By: Genevie Ann M.D.   On: 11/05/2018 20:41   Result Date: 11/05/2018 CLINICAL  DATA:  68 year old male with episodic vertigo. EXAM: MRI HEAD WITHOUT CONTRAST TECHNIQUE: Multiplanar, multiecho pulse sequences of the brain and surrounding structures were obtained without intravenous contrast. COMPARISON:  CT head without contrast 02/02/2010. FINDINGS: Brain: There is abnormal smooth posterosuperior convexity dural thickening or small subdural hematomas, best demonstrated on FLAIR series 4, image 21. These measure 2-3 millimeters in thickness bilaterally. Trace similar anterior dural thickening or extra-axial blood on series 4, image 18. No intracranial mass effect. No other intracranial blood products identified. No restricted diffusion to suggest acute infarction. No midline shift, mass effect, evidence of mass lesion, ventriculomegaly. Cervicomedullary junction and pituitary are within normal limits. Pearline Cables and white matter signal is within normal limits throughout the  brain. No encephalomalacia identified. Vascular: Major intracranial vascular flow voids are preserved although the vertebrobasilar system is diminutive. This appears to beyond the basis of bilateral fetal type PCA origins (normal variant). Skull and upper cervical spine: Negative visible cervical spine. Normal bone marrow signal. Sinuses/Orbits: Postoperative changes to both globes, otherwise negative orbits. Small right maxillary sinus mucous retention cyst. Other paranasal sinuses are clear. Other: Mastoids are clear. Scalp and face soft tissues appear negative. IMPRESSION: 1. Positive for bilateral superior convexity Dural Thickening or less likely Trace Bilateral Subdural Hematomas. Recommend follow-up post-contrast Brain MRI which should confirm. Preliminary differential considerations for smooth dural thickening include dural inflammation, sequelae of lumbar puncture, residual of prior subdural hematoma, and intracranial hypotension. 2. No other acute intracranial abnormality. And otherwise negative for age noncontrast MRI  appearance of the brain. Electronically Signed: By: Genevie Ann M.D. On: 11/05/2018 20:23   Mr Jeri Cos Contrast  Result Date: 11/06/2018 CLINICAL DATA:  68 year old male with vertigo, dizziness. Abnormal appearance of the dura or subdural space over the convexities on the earlier noncontrast MRI. EXAM: MRI HEAD WITH CONTRAST TECHNIQUE: Multiplanar, multiecho pulse sequences of the brain and surrounding structures were obtained with intravenous contrast. CONTRAST:  7.62m GADAVIST GADOBUTROL 1 MMOL/ML IV SOLN COMPARISON:  Noncontrast MRI 11/05/2018. FINDINGS: These postcontrast images do confirm the presence of generalized pachymeningeal thickening and enhancement (series 2, image 36 and series 3, image 11) responsible for the convexity dural appearance on the earlier noncontrast MRI. There is no evidence of subdural fluid collection or blood. The pachymeningeal thickening appears smooth throughout. The basilar cisterns are also somewhat affected. There is no associated leptomeningeal thickening or enhancement. No associated abnormal parenchymal enhancement, incidental left anterior frontal lobe developmental venous anomaly (normal variant series 2, image 35). The major dural venous sinuses are enhancing and appear to be patent. No abnormal IAC enhancement. Negative visible cervical spine and spinal cord. Visualized bone marrow signal is within normal limits. IMPRESSION: 1. Post-contrast MRI confirms generalized pachymeningeal thickening and enhancement as the dural abnormality on the earlier noncontrast exam. No associated leptomeningeal enhancement. No abnormal parenchymal enhancement. 2. Diffuse smooth dural thickening is nonspecific has a fairly broad differential including due to: Intracranial hypotension, recent lumbar puncture, prior intracranial surgery, prior subdural hematoma, inflammatory pachymeningitis, or less likely infectious meningitis. Electronically Signed   By: HGenevie AnnM.D.   On: 11/06/2018 00:31    Ct Abdomen Pelvis W Contrast  Result Date: 11/03/2018 CLINICAL DATA:  Worsening abdominal pain and tenderness. Worsening nausea and vomiting. Weight loss. Follow-up diverticulitis. EXAM: CT ABDOMEN AND PELVIS WITH CONTRAST TECHNIQUE: Multidetector CT imaging of the abdomen and pelvis was performed using the standard protocol following bolus administration of intravenous contrast. CONTRAST:  1023mOMNIPAQUE IOHEXOL 300 MG/ML  SOLN COMPARISON:  10/01/2018 FINDINGS: Lower Chest: No acute findings. Hepatobiliary: No hepatic masses identified. Gallbladder is unremarkable. No evidence of biliary ductal dilatation. Pancreas:  No mass or inflammatory changes. Spleen: Within normal limits in size and appearance. Adrenals/Urinary Tract: No masses identified. No evidence of hydronephrosis. Stomach/Bowel: Mild diverticulitis again seen involving the distal sigmoid colon, without significant change. A tiny extraluminal gas and fluid collection is seen in the adjacent sigmoid mesentery measuring 1.6 cm on image 113/6. This is also stable and consistent with a micro perforation and small diverticular abscess. No evidence of bowel obstruction. Vascular/Lymphatic: No pathologically enlarged lymph nodes. Stable postop changes from previous aortobifemoral bypass grafting. Reproductive:  No mass or other significant abnormality. Other:  None. Musculoskeletal: No  suspicious bone lesions identified. Prior lumbar spine fusion again noted. IMPRESSION: 1. No significant change in mild distal sigmoid diverticulitis, with micro-perforation and small diverticular abscess. 2. No other complication identified. Electronically Signed   By: Marlaine Hind M.D.   On: 11/03/2018 13:21   US Renal  Result Date: 11/04/2018 CLINICAL DATA:  68 year old male with acute kidney injury. EXAM: RENAL / URINARY TRACT ULTRASOUND COMPLETE COMPARISON:  None. FINDINGS: Right Kidney: Renal measurements: 12.3 x 6.9 x 5.7 cm = volume: 251 mL. Mild cortical  thinning noted. Echogenicity within normal limits. No mass or hydronephrosis visualized. Left Kidney: Renal measurements: 12.2 x 6.6 x 5.8 cm = volume: 244 mL. Mild cortical thinning noted. Echogenicity within normal limits. No mass or hydronephrosis visualized. Bladder: Appears normal for degree of bladder distention. IMPRESSION: Mild bilateral cortical thinning without other significant abnormality. Electronically Signed   By: Margarette Canada M.D.   On: 11/04/2018 15:21   [place] Ekg Site Rite  Result Date: 11/05/2018 If Site Rite image not attached, placement could not be confirmed due to current cardiac rhythm.    Discharge Exam: Vitals:   11/10/18 0436 11/10/18 0858  BP: 129/71 (!) 124/55  Pulse: 71 65  Resp: (!) 21   Temp: 98.1 F (36.7 C)   SpO2: 93%    Vitals:   11/09/18 1610 11/09/18 2031 11/10/18 0436 11/10/18 0858  BP: 119/68 121/81 129/71 (!) 124/55  Pulse: 78 85 71 65  Resp: 19 20 (!) 21   Temp: (!) 97.5 F (36.4 C) 99.4 F (37.4 C) 98.1 F (36.7 C)   TempSrc: Oral Oral Oral   SpO2: 99% 99% 93%   Weight:   80.1 kg   Height:        General: Pt is alert, awake, not in acute distress Cardiovascular: RRR, S1/S2 +, no rubs, no gallops Respiratory: CTA bilaterally, no wheezing, no rhonchi Abdominal: Soft, NT, ND, bowel sounds + Extremities: no edema, no cyanosis    The results of significant diagnostics from this hospitalization (including imaging, microbiology, ancillary and laboratory) are listed below for reference.     Microbiology: Recent Results (from the past 240 hour(s))  SARS Coronavirus 2 [facility] order, Performed in [facility] hospital lab) Nasopharyngeal Nasopharyngeal Swab     Status: None   Collection Time: 11/03/18  3:36 PM   Specimen: Nasopharyngeal Swab  Result Value Ref Range Status   SARS Coronavirus 2 NEGATIVE NEGATIVE Final    Comment: (NOTE) If result is NEGATIVE SARS-CoV-2 target nucleic acids are NOT DETECTED. The SARS-CoV-2 RNA is  generally detectable in upper and lower  respiratory specimens during the acute phase of infection. The lowest  concentration of SARS-CoV-2 viral copies this assay can detect is 250  copies / mL. A negative result does not preclude SARS-CoV-2 infection  and should not be used as the sole basis for treatment or other  patient management decisions.  A negative result may occur with  improper specimen collection / handling, submission of specimen other  than nasopharyngeal swab, presence of viral mutation(s) within the  areas targeted by this assay, and inadequate number of viral copies  (<250 copies / mL). A negative result must be combined with clinical  observations, patient history, and epidemiological information. If result is POSITIVE SARS-CoV-2 target nucleic acids are DETECTED. The SARS-CoV-2 RNA is generally detectable in upper and lower  respiratory specimens dur ing the acute phase of infection.  Positive  results are indicative of active infection with SARS-CoV-2.  Clinical  correlation with patient history and other diagnostic information is  necessary to determine patient infection status.  Positive results do  not rule out bacterial infection or co-infection with other viruses. If result is PRESUMPTIVE POSTIVE SARS-CoV-2 nucleic acids MAY BE PRESENT.   A presumptive positive result was obtained on the submitted specimen  and confirmed on repeat testing.  While 2019 novel coronavirus  (SARS-CoV-2) nucleic acids may be present in the submitted sample  additional confirmatory testing may be necessary for epidemiological  and / or clinical management purposes  to differentiate between  SARS-CoV-2 and other Sarbecovirus currently known to infect humans.  If clinically indicated additional testing with an alternate test  methodology [id number]) is advised. The SARS-CoV-2 RNA is generally  detectable in upper and lower respiratory sp ecimens during the acute  phase of  infection. The expected result is Negative. Fact Sheet for Patients:  StrictlyIdeas.no Fact Sheet for Healthcare Providers: BankingDealers.co.za This test is not yet approved or cleared by the [place] FDA and has been authorized for detection and/or diagnosis of SARS-CoV-2 by FDA under an Emergency Use Authorization (EUA).  This EUA will remain in effect (meaning this test can be used) for the duration of the COVID-19 declaration under Section 564(b)(1) of the Act, 21 U.S.C. section 360bbb-3(b)(1), unless the authorization is terminated or revoked sooner. Performed at [facility], [place] [address] [place], [place] [address]   Culture, blood (routine x 2)     Status: None   Collection Time: 11/03/18  5:58 PM   Specimen: BLOOD  Result Value Ref Range Status   Specimen Description BLOOD LEFT ANTECUBITAL  Final   Special Requests   Final    BOTTLES DRAWN AEROBIC AND ANAEROBIC Blood Culture adequate volume   Culture   Final    NO GROWTH 5 DAYS Performed at [facility], [place] [address], [place], [place] [address]    Report Status 11/08/2018 FINAL  Final  Culture, blood (routine x 2)     Status: None   Collection Time: 11/03/18  6:40 PM   Specimen: BLOOD LEFT HAND  Result Value Ref Range Status   Specimen Description BLOOD LEFT HAND  Final   Special Requests   Final    BOTTLES DRAWN AEROBIC AND ANAEROBIC Blood Culture results may not be optimal due to an inadequate volume of blood received in culture bottles   Culture   Final    NO GROWTH 5 DAYS Performed at [facility], [place] [address] [place], [place] [address]    Report Status 11/08/2018 FINAL  Final  Culture, Urine     Status: None   Collection Time: 11/04/18  6:57 AM   Specimen: Urine, Random  Result Value Ref Range Status   Specimen Description URINE, RANDOM  Final   Special Requests NONE  Final   Culture   Final    NO GROWTH Performed at [facility], [place] [address] [place], [place] [address]    Report Status 11/05/2018 FINAL  Final     Labs: BNP (last 3 results) No results for input(s): BNP in the last 8760 hours. Basic Metabolic Panel: Recent Labs  Lab 11/07/18 0500 11/07/18 0829 11/08/18 0505 11/09/18 0358 11/10/18 0741  NA 132* 138 139 136 136  K 6.1* 4.1 4.2 4.7 4.4  CL 104 110 108 106 103  CO2 _0 GLUCOSE 492* 137* 129* 145* 126*  BUN 11 10 7* 9 14  CREATININE 0.58* 0.58* 0.58* 0.68 0.75  CALCIUM 8.1* 8.5* 8.7* 9.2 9.2  MG 2.2 1.6* 1.6* 1.5* 1.5*  PHOS 4.2 2.1* 2.8 3.2 3.3   Liver Function Tests: Recent Labs  Lab 11/07/18 0500 11/07/18 0829 11/08/18 0505 11/09/18 0358 11/10/18 0741  AST _0 ALT _1 ALKPHOS 59 65 74 83 105  BILITOT 1.2 0.7 0.9 0.6 0.5  PROT 4.3* 5.2* 5.2* 5.4* 5.7*  ALBUMIN 2.5* 2.7* 2.7* 2.9* 2.9*   No results for input(s): LIPASE, AMYLASE in the last 168 hours. No results for input(s): AMMONIA in the last 168 hours. CBC: Recent Labs  Lab 11/06/18 0541 11/07/18 0500 11/08/18 0505 11/09/18 0358 11/10/18 0741  WBC 8.6 7.8 6.9 8.8 11.5*  NEUTROABS 6.2 5.6 4.3 6.0 8.1*  HGB 12.6* 12.9* 13.9 14.4 15.3  HCT 37.3* 38.6* 39.9 43.2 43.8  MCV 97.4 98.0 95.0 95.8 93.6  PLT 197 206 187 219 237   Cardiac Enzymes: No results for input(s): CKTOTAL, CKMB, CKMBINDEX, TROPONINI in the last 168 hours. BNP: Invalid input(s): POCBNP CBG: Recent Labs  Lab 11/09/18 1617 11/09/18 2000 11/09/18 2359 11/10/18 0433 11/10/18 0839  GLUCAP 173* 123* 135* 143* 111*   D-Dimer No results for input(s): DDIMER in the last 72 hours. Hgb A1c No results for input(s): HGBA1C in the last 72 hours. Lipid Profile No results for input(s): CHOL, HDL, LDLCALC, TRIG, CHOLHDL, LDLDIRECT in the last 72 hours. Thyroid function studies No results for input(s): TSH, T4TOTAL, T3FREE, THYROIDAB in the last 72 hours.  Invalid input(s): FREET3 Anemia work up No  results for input(s): VITAMINB12, FOLATE, FERRITIN, TIBC, IRON, RETICCTPCT in the last 72 hours. Urinalysis    Component Value Date/Time   COLORURINE AMBER (A) 11/04/2018 1821   APPEARANCEUR HAZY (A) 11/04/2018 1821   LABSPEC >1.046 (H) 11/04/2018 1821   PHURINE 5.0 11/04/2018 1821   GLUCOSEU 50 (A) 11/04/2018 1821   HGBUR NEGATIVE 11/04/2018 1821   BILIRUBINUR NEGATIVE 11/04/2018 1821   KETONESUR 5 (A) 11/04/2018 1821   PROTEINUR 100 (A) 11/04/2018 1821   UROBILINOGEN 0.2 01/22/2010 1943   NITRITE NEGATIVE 11/04/2018 1821   LEUKOCYTESUR NEGATIVE 11/04/2018 1821   Sepsis Labs Invalid input(s): PROCALCITONIN,  WBC,  LACTICIDVEN Microbiology Recent Results (from the past 240 hour(s))  SARS Coronavirus 2 [facility] order, Performed in [facility] hospital lab) Nasopharyngeal Nasopharyngeal Swab     Status: None   Collection Time: 11/03/18  3:36 PM   Specimen: Nasopharyngeal Swab  Result Value Ref Range Status   SARS Coronavirus 2 NEGATIVE NEGATIVE Final    Comment: (NOTE) If result is NEGATIVE SARS-CoV-2 target nucleic acids are NOT DETECTED. The SARS-CoV-2 RNA is generally detectable in upper and lower  respiratory specimens during the acute phase of infection. The lowest  concentration of SARS-CoV-2 viral copies this assay can detect is 250  copies / mL. A negative result does not preclude SARS-CoV-2 infection  and should not be used as the sole basis for treatment or other  patient management decisions.  A negative result may occur with  improper specimen collection / handling, submission of specimen other  than nasopharyngeal swab, presence of viral mutation(s) within the  areas targeted by this assay, and inadequate number of viral copies  (<250 copies / mL). A negative result must be combined with clinical  observations, patient history, and epidemiological information. If result is POSITIVE SARS-CoV-2 target nucleic acids are DETECTED. The SARS-CoV-2 RNA is generally  detectable in  upper and lower  respiratory specimens dur ing the acute phase of infection.  Positive  results are indicative of active infection with SARS-CoV-2.  Clinical  correlation with patient history and other diagnostic information is  necessary to determine patient infection status.  Positive results do  not rule out bacterial infection or co-infection with other viruses. If result is PRESUMPTIVE POSTIVE SARS-CoV-2 nucleic acids MAY BE PRESENT.   A presumptive positive result was obtained on the submitted specimen  and confirmed on repeat testing.  While 2019 novel coronavirus  (SARS-CoV-2) nucleic acids may be present in the submitted sample  additional confirmatory testing may be necessary for epidemiological  and / or clinical management purposes  to differentiate between  SARS-CoV-2 and other Sarbecovirus currently known to infect humans.  If clinically indicated additional testing with an alternate test  methodology [phone]) is advised. The SARS-CoV-2 RNA is generally  detectable in upper and lower respiratory sp ecimens during the acute  phase of infection. The expected result is Negative. Fact Sheet for Patients:  StrictlyIdeas.no Fact Sheet for Healthcare Providers: BankingDealers.co.za This test is not yet approved or cleared by the [place] FDA and has been authorized for detection and/or diagnosis of SARS-CoV-2 by FDA under an Emergency Use Authorization (EUA).  This EUA will remain in effect (meaning this test can be used) for the duration of the COVID-19 declaration under Section 564(b)(1) of the Act, 21 U.S.C. section 360bbb-3(b)(1), unless the authorization is terminated or revoked sooner. Performed at [facility], [place] [address] [place], [place] [address]   Culture, blood (routine x 2)     Status: None   Collection Time: 11/03/18  5:58 PM   Specimen: BLOOD  Result Value Ref Range Status    Specimen Description BLOOD LEFT ANTECUBITAL  Final   Special Requests   Final    BOTTLES DRAWN AEROBIC AND ANAEROBIC Blood Culture adequate volume   Culture   Final    NO GROWTH 5 DAYS Performed at [facility], [place] [address] [place], [place] [address]    Report Status 11/08/2018 FINAL  Final  Culture, blood (routine x 2)     Status: None   Collection Time: 11/03/18  6:40 PM   Specimen: BLOOD LEFT HAND  Result Value Ref Range Status   Specimen Description BLOOD LEFT HAND  Final   Special Requests   Final    BOTTLES DRAWN AEROBIC AND ANAEROBIC Blood Culture results may not be optimal due to an inadequate volume of blood received in culture bottles   Culture   Final    NO GROWTH 5 DAYS Performed at [facility], [place] [address], [place], [place] [address]    Report Status 11/08/2018 FINAL  Final  Culture, Urine     Status: None   Collection Time: 11/04/18  6:57 AM   Specimen: Urine, Random  Result Value Ref Range Status   Specimen Description URINE, RANDOM  Final   Special Requests NONE  Final   Culture   Final    NO GROWTH Performed at [facility], [place] [address], [place], [place] [address]    Report Status 11/05/2018 FINAL  Final     Time coordinating discharge: Over 30 minutes  SIGNED:   Darliss Cheney, MD  [facility] 11/10/2018, 10:30 AM Pager [phone]  If 7PM-7AM, please contact night-coverage [email or web address] Password [email or web address]

## 2018-11-10 NOTE — Progress Notes (Addendum)
    [id number] pain  Subjective:  Doing well.  He looks good today.  Home today.  To follow up with Dr. Johney Maine.  Objective: Vital signs in last 24 hours: Temp:  [97.5 F (36.4 C)-99.4 F (37.4 C)] 98.1 F (36.7 C) (09/19 0436) Pulse Rate:  [65-85] 65 (09/19 0858) Resp:  [19-21] 21 (09/19 0436) BP: (117-129)/(55-81) 124/55 (09/19 0858) SpO2:  [93 %-99 %] 93 % (09/19 0436) Weight:  [80.1 kg] 80.1 kg (09/19 0436) Last BM Date: 11/09/18 2116 p.o. 600 IV 2780 urine No stool recorded Afebrile vital signs are stable No labs today Intake/Output from previous day: 09/18 0701 - 09/19 0700 In: 940 [P.O.:940] Out: 1875 [Urine:1075; Emesis/NG output:800] Intake/Output this shift: No intake/output data recorded.  General appearance: alert, cooperative, no distress and PT getting ready to walk him and using walker Resp: clear to auscultation bilaterally GI: soft, non-tender; bowel sounds normal; no masses,  no organomegaly  Lab Results:  Recent Labs    11/09/18 0358 11/10/18 0741  WBC 8.8 11.5*  HGB 14.4 15.3  HCT 43.2 43.8  PLT 219 237    BMET Recent Labs    11/09/18 0358 11/10/18 0741  NA 136 136  K 4.7 4.4  CL 106 103  CO2 23 25  GLUCOSE 145* 126*  BUN 9 14  CREATININE 0.68 0.75  CALCIUM 9.2 9.2   PT/INR No results for input(s): LABPROT, INR in the last 72 hours.  Recent Labs  Lab 11/07/18 0500 11/07/18 0829 11/08/18 0505 11/09/18 0358 11/10/18 0741  AST 29 26 25 31 25   ALT 13 15 15 20 22   ALKPHOS 59 65 74 83 105  BILITOT 1.2 0.7 0.9 0.6 0.5  PROT 4.3* 5.2* 5.2* 5.4* 5.7*  ALBUMIN 2.5* 2.7* 2.7* 2.9* 2.9*     Lipase     Component Value Date/Time   LIPASE 25 03/24/2017 1857     Medications: . aspirin EC  81 mg Oral Daily  . atorvastatin  80 mg Oral QHS  . Chlorhexidine Gluconate Cloth  6 each Topical Daily  . enoxaparin (LOVENOX) injection  40 mg Subcutaneous Q24H  . [START ON 11/12/2018] influenza vaccine adjuvanted  0.5 mL  Intramuscular Tomorrow-1000  . insulin aspart  0-9 Units Subcutaneous Q4H  . metoprolol succinate  25 mg Oral Daily  . pantoprazole  40 mg Oral QHS  . sodium chloride flush  10-40 mL Intracatheter Q12H  . sodium chloride flush  3 mL Intravenous Q12H    Assessment/Plan Sepsis secondary to diverticulitis - blood cultures pending AKI improving  - Creatinine 1.85>>1.46>>0.92(9/14)>0.61 (9/15) Hx CAD - Plavix/ASA on hold Hypertension Type II diabetes - diet controlled OSA - not on CPAP Hypomagnesemia - Mg 0.5 this AM Malnutrition -Pre-alb 14.7 Possible UTI - 21- 50 WBC/HPF (9/13)- No growth on urine culture COVID negative 11/03/18  Acute on chronic diverticulitiswith microperforation -Continue Invanz. WBC normalized.  FEN-TPN/full liquids to soft diet VTE-SCDs, okay for chemical prophylaxis from our standpoint. ID: Maxipime 9/12 X 2dose; invanx9/13 >>day6 Follow up: TBD  Plan:  Continue Invanz thru 11/25/18. He has a PICC line for this.   He will follow up with Dr. Johney Maine on 12/03/18.    Info is in the AVS, we will recheck labs again in AM.    Please send a copy of DC summary to Dr. Clyda Greener.    LOS: 7 days   Shann Medal 11/10/2018

## 2018-11-10 NOTE — TOC Transition Note (Signed)
Transition of Care [facility]) - CM/SW Discharge Note   Patient Details  Name: Ray Brown MRN: [id number] Date of Birth: 01/22/1951  Transition of Care [facility]) CM/SW Contact:  Claudie Leach, RN Phone Number: 11/10/2018, 10:18 AM   Clinical Narrative:    Pt to d/c today after 10am dose of Invanz.  Pt is scheduled for RN visit tomorrow morning from [facility] to initiate home IV antibiotics.  Meds will be delivered this afternoon.  Pam, [place] nurse, did education with wife yesterday.  Patient is prepared for d/c.    Final next level of care: [place] Barriers to Discharge: No Barriers Identified   Patient Goals and CMS Choice Patient states their goals for this hospitalization and ongoing recovery are:: "to return home" CMS Medicare.gov Compare Post Acute Care list provided to:: Patient Choice offered to / list presented to : Patient   Discharge Plan and Services In-house Referral: NA Discharge Planning Services: CM Consult Post Acute Care Choice: Home Health, Durable Medical Equipment          DME Arranged: Walker rolling DME Agency: [employer] Date DME Agency Contacted: 11/09/18 Time DME Agency Contacted: [id number] Representative spoke with at DME Agency: [place]: RN, Disease Management, IV Antibiotics HH Agency: Other - See comment Date [place]: 11/09/18 Time [place]: [place] Representative spoke with at [place]: [place] with [facility] IV infusion.

## 2018-11-10 NOTE — Progress Notes (Signed)
Reviewing the chart a note documented by RN Reynal Buendia noted that was documented at 2:06 pm on 11/09/18 about a question that family had for MD regarding insulin. It is to be noted that MD/writter was never notified of such concern, nor I was paged in any way. However I have addressed the question with the patient today. Primary RN was advised to page MD for any family/patient concerns directly with physician by paging/secure chat to make sure MD is aware of any concern/question. Charge nurse today made aware of this incident.

## 2018-11-11 ENCOUNTER — Other Ambulatory Visit (HOSPITAL_COMMUNITY)
Admission: RE | Admit: 2018-11-11 | Discharge: 2018-11-11 | Disposition: A | Discharge status: Home / Self Care | Payer: 59 | Source: Skilled Nursing Facility | Attending: Family Medicine | Admitting: Family Medicine

## 2018-11-11 DIAGNOSIS — A413 Sepsis due to Hemophilus influenzae: Secondary | ICD-10-CM | POA: Insufficient documentation

## 2018-11-11 LAB — SEDIMENTATION RATE: Sed Rate: 6 mm/hr (ref 0–16)

## 2018-11-11 LAB — C-REACTIVE PROTEIN: CRP: 1.2 mg/dL — ABNORMAL HIGH (ref <1.0)

## 2018-11-12 ENCOUNTER — Other Ambulatory Visit (HOSPITAL_COMMUNITY)
Admission: RE | Admit: 2018-11-12 | Discharge: 2018-11-12 | Disposition: A | Discharge status: Home / Self Care | Payer: 59 | Source: Skilled Nursing Facility | Attending: Family Medicine | Admitting: Family Medicine

## 2018-11-12 ENCOUNTER — Telehealth (INDEPENDENT_AMBULATORY_CARE_PROVIDER_SITE_OTHER): Payer: 59 | Admitting: Cardiology

## 2018-11-12 ENCOUNTER — Other Ambulatory Visit: Payer: Self-pay

## 2018-11-12 VITALS — Wt 177.0 lb

## 2018-11-12 DIAGNOSIS — I739 Peripheral vascular disease, unspecified: Secondary | ICD-10-CM

## 2018-11-12 DIAGNOSIS — I2510 Atherosclerotic heart disease of native coronary artery without angina pectoris: Secondary | ICD-10-CM | POA: Diagnosis not present

## 2018-11-12 DIAGNOSIS — A413 Sepsis due to Hemophilus influenzae: Secondary | ICD-10-CM | POA: Insufficient documentation

## 2018-11-12 DIAGNOSIS — E782 Mixed hyperlipidemia: Secondary | ICD-10-CM

## 2018-11-12 LAB — CBC WITH DIFFERENTIAL/PLATELET
Abs Immature Granulocytes: 0.03 10*3/uL (ref 0.00–0.07)
Basophils Absolute: 0.1 10*3/uL (ref 0.0–0.1)
Basophils Relative: 1 %
Eosinophils Absolute: 0.3 10*3/uL (ref 0.0–0.5)
Eosinophils Relative: 3 %
HCT: 41.4 % (ref 39.0–52.0)
Hemoglobin: 13.3 g/dL (ref 13.0–17.0)
Immature Granulocytes: 0 %
Lymphocytes Relative: 20 %
Lymphs Abs: 1.7 10*3/uL (ref 0.7–4.0)
MCH: 31.4 pg (ref 26.0–34.0)
MCHC: 32.1 g/dL (ref 30.0–36.0)
MCV: 97.9 fL (ref 80.0–100.0)
Monocytes Absolute: 0.9 10*3/uL (ref 0.1–1.0)
Monocytes Relative: 10 %
Neutro Abs: 5.8 10*3/uL (ref 1.7–7.7)
Neutrophils Relative %: 66 %
Platelets: 240 10*3/uL (ref 150–400)
RBC: 4.23 MIL/uL (ref 4.22–5.81)
RDW: 12.8 % (ref 11.5–15.5)
WBC: 8.7 10*3/uL (ref 4.0–10.5)
nRBC: 0 % (ref 0.0–0.2)

## 2018-11-12 LAB — BASIC METABOLIC PANEL
Anion gap: 7 (ref 5–15)
BUN: 9 mg/dL (ref 8–23)
CO2: 28 mmol/L (ref 22–32)
Calcium: 8.6 mg/dL — ABNORMAL LOW (ref 8.9–10.3)
Chloride: 103 mmol/L (ref 98–111)
Creatinine, Ser: 0.65 mg/dL (ref 0.61–1.24)
GFR calc Af Amer: >60 mL/min (ref >60)
GFR calc non Af Amer: >60 mL/min (ref >60)
Glucose, Bld: 160 mg/dL — ABNORMAL HIGH (ref 70–99)
Potassium: 4.2 mmol/L (ref 3.5–5.1)
Sodium: 138 mmol/L (ref 135–145)

## 2018-11-12 LAB — C-REACTIVE PROTEIN: CRP: 1 mg/dL — ABNORMAL HIGH (ref <1.0)

## 2018-11-12 LAB — SEDIMENTATION RATE: Sed Rate: 8 mm/hr (ref 0–16)

## 2018-11-12 MED ORDER — NITROGLYCERIN 0.4 MG SL SUBL: 0.4000 mg | SUBLINGUAL_TABLET | SUBLINGUAL | 3 refills | Status: DC | PRN

## 2018-11-12 NOTE — Patient Instructions (Signed)
Medication Instructions:  Your physician has recommended you make the following change in your medication: STOP NIASPAN, MAY TAKE FISH OILsupplements (OTC)   If you need a refill on your cardiac medications before your next appointment, please call your pharmacy.   Lab work: NONE If you have labs (blood work) drawn today and your tests are completely normal, you will receive your results only by: Marland Kitchen MyChart Message (if you have MyChart) OR . A paper copy in the mail If you have any lab test that is abnormal or we need to change your treatment, we will call you to review the results.  Testing/Procedures: NONE  Follow-Up: At [facility], you and your health needs are our priority.  As part of our continuing mission to provide you with exceptional heart care, we have created designated Provider Care Teams.  These Care Teams include your primary Cardiologist (physician) and Advanced Practice Providers (APPs -  Physician Assistants and Nurse Practitioners) who all work together to provide you with the care you need, when you need it. You will need a follow up appointment in 12 months.  Please call our office 2 months in advance to schedule this appointment.  You may see Candee Furbish, MD or one of the following Advanced Practice Providers on your designated Care Team:   Truitt Merle, NP Cecilie Kicks, NP . Kathyrn Drown, NP  Any Other Special Instructions Will Be Listed Below (If Applicable). PLEASE STOP FISH OIL AND PLAVIX 5 DAYS PRIOR TO YOUR UPCOMING COLON SURGERY November 2020.

## 2018-11-12 NOTE — Progress Notes (Signed)
Video visit, telehealth.  COVID-19 precautions.  Education surrounding COVID performed.  [place]. [address] [place], [place]  [address] Phone: [phone] Fax:  [phone]  Date:  11/12/2018   ID:  Ray Brown, DOB 12-18-1950, MRN [id number]  PCP:  [name], MD   History of Present Illness: Ray Brown is a 68 y.o. male with coronary artery disease drug eluting stent in July 2010, bare-metal stent and RCA August 08 (had in-stent restenosis) with hypertension, hyperlipidemia, aortobifemoral bypass in 04/25/2001 Dr. Donnetta Hutching, sleep apnea not on CPAP here he here for followup.  10/2010 and 2016-cardiac catheterization showing patent RCA stents after nuclear stress test showed inferior wall ischemia. Overall reassuring.  Prior right shoulder surgery surgery risk stratification. Had prior left shoulder surgery. No claudication. +Hip pain, cortisone. Hip pain is lateral. Saw Dr. Saintclair Halsted - injections.   Right hip pain, hard to get up in the morning. Both hips hurt with exertion. Tramadol does not seem to help.   Hyperlipidemia-excellently controlled. See below  Mother died after surgery in Apr 25, 2014  Still having back pain, left foot numb. Pred pack.   Right iliac stent (the way he describes his procedure) as well as aortobifemoral bypass. Dr. Donnetta Hutching.   09/22/2017- overall main complaint is back pain, left leg sciatica.  Getting injections for this.  Holding his Plavix and aspirin when injections are taking place.  This is good.  He is not having any anginal symptoms, no syncope, no bleeding.  He is compliant with his cholesterol medications.  No myalgias.  11/12/2018- chief complaint coronary artery disease follow-up.  Was in hospital, colonic perforation, IV antibiotics.  Niacin is becoming more expensive because of insurance change.   Wt Readings from Last 3 Encounters:  11/12/18 177 lb (80.3 kg)  11/10/18 176 lb 9.4 oz (80.1 kg)  10/04/17 206 lb (93.4 kg)     Past Medical  History:  Diagnosis Date  . Arthritis   . Bronchitis   . Chronic back pain   . Complication of anesthesia    difficulty breathing s/p back surgery at [facility] 26-Apr-2011  . Coronary artery disease   . Diabetes mellitus without complication ([place])   . Dry skin    in the back of head-left side  . GERD (gastroesophageal reflux disease)   . H/O hiatal hernia   . Hypercholesteremia   . Peripheral vascular disease ([place])   . Pneumonia   . Shortness of breath   . Sleep apnea    has CPAP but does not use    Past Surgical History:  Procedure Laterality Date  . aortic bypass  04/25/2001   aortobifemoral bypass  . BACK SURGERY  2002/04/25  . CARDIAC CATHETERIZATION  April 25, 2008   x 2 stents RCA  . CARDIAC CATHETERIZATION N/A 12/08/2014   Procedure: Left Heart Cath and Coronary Angiography;  Surgeon: Lorretta Harp, MD;  Location: [facility];  Service: Cardiovascular;  Laterality: N/A;  . COLONOSCOPY  08/04/2009  . CORONARY ANGIOPLASTY     2 stents  . FOOT SURGERY  1980s   right foot  . SHOULDER ARTHROSCOPY WITH SUBACROMIAL DECOMPRESSION Right 03/28/2013   Procedure: RIGHT SHOULDER ARTHROSCOPY WITH SUBACROMIAL DECOMPRESSION AND DISTAL CLAVICLE RESECTION;  Surgeon: Marin Shutter, MD;  Location: [facility];  Service: Orthopedics;  Laterality: Right;  . SHOULDER SURGERY     left shoulder rotator cuff repair  . UPPER GI ENDOSCOPY      Current Outpatient Medications  Medication  Sig Dispense Refill  . aspirin EC 81 MG tablet Take 81 mg by mouth at bedtime.    Marland Kitchen atorvastatin (LIPITOR) 80 MG tablet Take 1 tablet (80 mg total) by mouth at bedtime. Please keep upcoming appt for future refills. Thank you 90 tablet 0  . clopidogrel (PLAVIX) 75 MG tablet Take 1 tablet (75 mg total) by mouth at bedtime. Please keep upcoming appt for future refills. Thank you 90 tablet 0  . ertapenem (INVANZ) IVPB Inject 1 g into the vein daily for 14 days. Indication:  Diverticulitis Last Day of Therapy:  11/25/2018 Labs - Once weekly:   CBC/D and BMP, Labs - Every other week:  ESR and CRP 14 Units 0  . fenofibrate 160 MG tablet Take 160 mg by mouth at bedtime.    . metoprolol succinate (TOPROL-XL) 25 MG 24 hr tablet TAKE 1 TABLET BY MOUTH  DAILY 90 tablet 0  . nitroGLYCERIN (NITROSTAT) 0.4 MG SL tablet Place 1 tablet (0.4 mg total) under the tongue every 5 (five) minutes as needed for up to 25 days. For chest pain 25 tablet 3  . pantoprazole (PROTONIX) 40 MG tablet Take 40 mg by mouth at bedtime.    . traMADol (ULTRAM) 50 MG tablet Take 50 mg by mouth every 12 (twelve) hours as needed for moderate pain.      No current facility-administered medications for this visit.     Allergies:    Allergies  Allergen Reactions  . Ciprofloxacin Rash  . Daypro [Oxaprozin] Rash    Social History:  The patient  reports that he has quit smoking. His smoking use included cigarettes. He quit after 35.00 years of use. He has never used smokeless tobacco. He reports that he does not drink alcohol or use drugs.   ROS:  Please see the history of present illness.  All other review of systems negative  PHYSICAL EXAM: VS:  Wt 177 lb (80.3 kg)   BMI 26.91 kg/m   General-alert and oriented x3 no acute distress Respirations- able to complete full sentences without difficulty, normal respiratory rate, no distress. Neurologic-nonfocal moves all extremities Psych-pleasant  EKG: Prior normal sinus rhythm heart rate 73 bpm nonspecific T-wave changes. Personally viewed 11/13/14-sinus rhythm, no other abnormalities, 77 bpm. Personally viewed-prior 10/04/13: Sinus rhythm rate 69 with no other significant abnormalities.   No change from prior.   Cardiac catheterization 12/08/14:  IMPRESSION:Ray Brown has a widely patent RCA stent. The remainder of his coronary anatomy shows no evidence of obstructive disease. His LV function is normal.  ASSESSMENT AND PLAN:   Diverticulitis/preoperative risk stratification  - perfrication - IV abx.  - Lost 30  pounds.  - Waiting for surgery to take out area of perforation. November.  He may proceed with colonic surgery with moderate risk from a cardiovascular perspective based upon his prior CAD.  It is okay for him to hold his Plavix for 5 days prior to surgery.  I have also indicated that if he does decide to use fish oil that he holds this as well.  Coronary artery disease -Cardiac catheterization 2016 showed patent RCA stent.  Overall he is been doing quite well without any anginal symptoms that are significant.  Continue with aggressive secondary risk factor prevention.  No anginal symptoms doing well.  Peripheral vascular disease  - Plavix and aspirin (may hold the Plavix 5 days prior to surgery)  - Exercise  - Dr. Donnetta Hutching, prior femoropopliteal bypass in 2003  Obesity  -Had a significant  weight loss during his diverticulitis.  Hyperlipidemia  - High intensity atorvastatin.  - Niaspan therapy is going to be too expensive for him.  Could consider changing to fish oil supplement.  He is continuing with fenofibrate, statin. LDL 41. Excellent. He reminded me that when we tried to stop his non-statin agents his cholesterol went up significantly. Wife is retired and moving to [employer] advantage. 68$ per month for Niaspan.   - Dr. Brigitte Pulse has been monitoring his lab work. Personally reviewed.  We will continue without any changes.  Diabetes mellitus with hyperlipidemia, peripheral vascular disease  - Doing well, diet control, 7.2 A1c, no changes  Back pain  - Dr. Saintclair Halsted. He has had prior back surgeries. His left foot paresthesias are worsening.  Injections.  Not discussed today.  Follow-up in 1 year.  Signed, Candee Furbish, MD [facility]  11/12/2018 9:02 AM

## 2018-11-17 ENCOUNTER — Other Ambulatory Visit (HOSPITAL_COMMUNITY)
Admission: AD | Admit: 2018-11-17 | Discharge: 2018-11-17 | Disposition: A | Discharge status: Home / Self Care | Payer: 59 | Source: Other Acute Inpatient Hospital | Attending: Family Medicine | Admitting: Family Medicine

## 2018-11-17 DIAGNOSIS — A413 Sepsis due to Hemophilus influenzae: Secondary | ICD-10-CM | POA: Diagnosis not present

## 2018-11-17 LAB — C-REACTIVE PROTEIN: CRP: 1.3 mg/dL — ABNORMAL HIGH (ref <1.0)

## 2018-11-17 LAB — SEDIMENTATION RATE: Sed Rate: 10 mm/hr (ref 0–16)

## 2018-11-24 ENCOUNTER — Other Ambulatory Visit (HOSPITAL_COMMUNITY)
Admission: AD | Admit: 2018-11-24 | Discharge: 2018-11-24 | Disposition: A | Discharge status: Home / Self Care | Payer: Medicare Other | Source: Ambulatory Visit | Attending: Family Medicine | Admitting: Family Medicine

## 2018-11-24 DIAGNOSIS — A413 Sepsis due to Hemophilus influenzae: Secondary | ICD-10-CM | POA: Insufficient documentation

## 2018-11-24 LAB — SEDIMENTATION RATE: Sed Rate: 3 mm/hr (ref 0–16)

## 2018-11-24 LAB — CBC WITH DIFFERENTIAL/PLATELET
Abs Immature Granulocytes: 0.03 10*3/uL (ref 0.00–0.07)
Basophils Absolute: 0.1 10*3/uL (ref 0.0–0.1)
Basophils Relative: 1 %
Eosinophils Absolute: 0.8 10*3/uL — ABNORMAL HIGH (ref 0.0–0.5)
Eosinophils Relative: 9 %
HCT: 41.7 % (ref 39.0–52.0)
Hemoglobin: 13.4 g/dL (ref 13.0–17.0)
Immature Granulocytes: 0 %
Lymphocytes Relative: 28 %
Lymphs Abs: 2.5 10*3/uL (ref 0.7–4.0)
MCH: 31.2 pg (ref 26.0–34.0)
MCHC: 32.1 g/dL (ref 30.0–36.0)
MCV: 97 fL (ref 80.0–100.0)
Monocytes Absolute: 0.6 10*3/uL (ref 0.1–1.0)
Monocytes Relative: 7 %
Neutro Abs: 5.2 10*3/uL (ref 1.7–7.7)
Neutrophils Relative %: 55 %
Platelets: 302 10*3/uL (ref 150–400)
RBC: 4.3 MIL/uL (ref 4.22–5.81)
RDW: 13 % (ref 11.5–15.5)
WBC: 9.1 10*3/uL (ref 4.0–10.5)
nRBC: 0 % (ref 0.0–0.2)

## 2018-11-24 LAB — COMPREHENSIVE METABOLIC PANEL
ALT: 21 U/L (ref 0–44)
AST: 30 U/L (ref 15–41)
Albumin: 3.2 g/dL — ABNORMAL LOW (ref 3.5–5.0)
Alkaline Phosphatase: 97 U/L (ref 38–126)
Anion gap: 6 (ref 5–15)
BUN: 12 mg/dL (ref 8–23)
CO2: 28 mmol/L (ref 22–32)
Calcium: 8.7 mg/dL — ABNORMAL LOW (ref 8.9–10.3)
Chloride: 106 mmol/L (ref 98–111)
Creatinine, Ser: 0.7 mg/dL (ref 0.61–1.24)
GFR calc Af Amer: >60 mL/min (ref >60)
GFR calc non Af Amer: >60 mL/min (ref >60)
Glucose, Bld: 146 mg/dL — ABNORMAL HIGH (ref 70–99)
Potassium: 4.4 mmol/L (ref 3.5–5.1)
Sodium: 140 mmol/L (ref 135–145)
Total Bilirubin: 0.6 mg/dL (ref 0.3–1.2)
Total Protein: 5.4 g/dL — ABNORMAL LOW (ref 6.5–8.1)

## 2018-11-24 LAB — C-REACTIVE PROTEIN: CRP: 0.8 mg/dL (ref <1.0)

## 2018-12-03 ENCOUNTER — Ambulatory Visit: Payer: Self-pay | Admitting: Surgery

## 2018-12-03 NOTE — H&P (Signed)
[name] Documented: 12/03/2018 10:02 AM Location: [facility] Patient #: [id number] DOB: [birth year] Married / Language: Cleophus Brown / Race: White Male  History of Present Illness [name] MD; 12/03/2018 10:47 AM) The patient is a 68 year old male who presents with diverticulitis. Note for "Diverticulitis": ` ` ` Patient sent for surgical consultation at the request of Dr. [name].  Chief Complaint: Recurrent diverticulitis with recent hospitalization for abscess. ` ` The patient is a patient with episodes of recurrent diverticulitis. Seems like he's been having repeated flares this year. Had seen Dr. [name] with our group in the summertime. At that time he had only 2 attacks and held off on wishing to do any surgery. Unfortunately, within a month he had another severe episode. Actually had an abscess. Required switching to IV Invanz to get under control since nonamenable to protect continues drainage. Eventually stabilized and was discharged a week later. He comes in for follow-up since his discharge last month. Set up to follow up with me.  She completed his Invanz through PICC line at home. Felt better. PICC line removed by home health. He followed up with Dr. [name] with cardiology felt the patient is moderate risk that can hold his Plavix. Patient feels better. Appetite improving. He normally moves his bowels every day. He does not smoke. Question of borderline diabetes been on any hypoglycemics. He did require a major open ureter by femoral bypass 2003 but no other abdominal surgeries. Claims he can walk about 20 minutes before he has to stop. He apparently saw [name] the PA with [facility] gastroenterology this year. Don't know if colonoscopy is been set up yet. Patient is on Plavix given his history of atherosclerotic disease. He had a tele-visit with Dr. [name] last month. Tentatively cleared.  No personal nor family history of GI/colon  cancer, inflammatory bowel disease, irritable bowel syndrome, allergy such as Celiac Sprue, dietary/dairy problems, colitis, ulcers nor gastritis. No recent sick contacts/gastroenteritis. No travel outside the country. No changes in diet. No dysphagia to solids or liquids. No significant heartburn or reflux. No hematochezia, hematemesis, coffee ground emesis. No evidence of prior gastric/peptic ulceration.  (Review of systems as stated in this history (HPI) or in the review of systems. Otherwise all other 12 point ROS are negative) ` ` `   Allergies (Sabrina Canty, CMA; 12/03/2018 10:03 AM) metroNIDAZOLE *ANTI-INFECTIVE AGENTS - MISC.* Nausea, Vomiting. Amoxicillin *PENICILLINS* Nausea, Vomiting. Allergies Reconciled  Medication History (Sabrina Canty, CMA; 12/03/2018 10:03 AM) Atorvastatin Calcium (80MG  Tablet, Oral) Active. Clopidogrel Bisulfate (75MG  Tablet, Oral) Active. Fenofibrate (160MG  Tablet, Oral) Active. Metoprolol Succinate ER (25MG  Tablet ER 24HR, Oral) Active. Niacin (750MG  Tablet ER, Oral) Active. Pantoprazole Sodium (40MG  Tablet DR, Oral) Active. HYDROcodone-Acetaminophen (5-325MG  Tablet, Oral) Active. Aspirin (81MG  Tablet, Oral) Active. Medications Reconciled    Vitals (Sabrina Canty CMA; 12/03/2018 10:03 AM) 12/03/2018 10:03 AM Weight: 184 lb Height: 68in Body Surface Area: 1.97 m Body Mass Index: 27.98 kg/m  Temp.: 98.40F(Temporal)  Pulse: 90 (Regular)  BP: 118/64 (Sitting, Left Arm, Standard)        Physical Exam [name] MD; 12/03/2018 10:25 AM)  General Mental Status-Alert. General Appearance-Not in acute distress, Not Sickly. Orientation-Oriented X3. Hydration-Well hydrated. Voice-Normal.  Integumentary Global Assessment Upon inspection and palpation of skin surfaces of the - Axillae: non-tender, no inflammation or ulceration, no drainage. and Distribution of scalp and body hair is  normal. General Characteristics Temperature - normal warmth is noted.  Head and Neck Head-normocephalic, atraumatic with  no lesions or palpable masses. Face Global Assessment - atraumatic, no absence of expression. Neck Global Assessment - no abnormal movements, no bruit auscultated on the right, no bruit auscultated on the left, no decreased range of motion, non-tender. Trachea-midline. Thyroid Gland Characteristics - non-tender.  Eye Eyeball - Left-Extraocular movements intact, No Nystagmus - Left. Eyeball - Right-Extraocular movements intact, No Nystagmus - Right. Cornea - Left-No Hazy - Left. Cornea - Right-No Hazy - Right. Sclera/Conjunctiva - Left-No scleral icterus, No Discharge - Left. Sclera/Conjunctiva - Right-No scleral icterus, No Discharge - Right. Pupil - Left-Direct reaction to light normal. Pupil - Right-Direct reaction to light normal.  ENMT Ears Pinna - Left - no drainage observed, no generalized tenderness observed. Pinna - Right - no drainage observed, no generalized tenderness observed. Nose and Sinuses External Inspection of the Nose - no destructive lesion observed. Inspection of the nares - Left - quiet respiration. Inspection of the nares - Right - quiet respiration. Mouth and Throat Lips - Upper Lip - no fissures observed, no pallor noted. Lower Lip - no fissures observed, no pallor noted. Nasopharynx - no discharge present. Oral Cavity/Oropharynx - Tongue - no dryness observed. Oral Mucosa - no cyanosis observed. Hypopharynx - no evidence of airway distress observed.  Chest and Lung Exam Inspection Movements - Normal and Symmetrical. Accessory muscles - No use of accessory muscles in breathing. Palpation Palpation of the chest reveals - Non-tender. Auscultation Breath sounds - Normal and Clear.  Cardiovascular Auscultation Rhythm - Regular. Murmurs & Other Heart Sounds - Auscultation of the heart reveals - No Murmurs and No  Systolic Clicks.  Abdomen Inspection Inspection of the abdomen reveals - No Visible peristalsis and No Abnormal pulsations. Umbilicus - No Bleeding, No Urine drainage. Palpation/Percussion Palpation and Percussion of the abdomen reveal - Soft, Non Tender, No Rebound tenderness, No Rigidity (guarding) and No Cutaneous hyperesthesia. Note: A long midline incision without hernia. Abdomen soft. Nontender. Not distended. No umbilical or incisional hernias. No guarding.  Male Genitourinary Sexual Maturity Tanner 5 - Adult hair pattern and Adult penile size and shape. Note: No inguinal hernias. Normal external genitalia. Epididymi, testes, and spermatic cords normal without any masses.  Peripheral Vascular Upper Extremity Inspection - Left - No Cyanotic nailbeds - Left, Not Ischemic. Inspection - Right - No Cyanotic nailbeds - Right, Not Ischemic.  Neurologic Neurologic evaluation reveals -normal attention span and ability to concentrate, able to name objects and repeat phrases. Appropriate fund of knowledge , normal sensation and normal coordination. Mental Status Affect - not angry, not paranoid. Cranial Nerves-Normal Bilaterally. Gait-Normal.  Neuropsychiatric Mental status exam performed with findings of-able to articulate well with normal speech/language, rate, volume and coherence, thought content normal with ability to perform basic computations and apply abstract reasoning and no evidence of hallucinations, delusions, obsessions or homicidal/suicidal ideation.  Musculoskeletal Global Assessment Spine, Ribs and Pelvis - no instability, subluxation or laxity. Right Upper Extremity - no instability, subluxation or laxity.  Lymphatic Head & Neck  General Head & Neck Lymphatics: Bilateral - Description - No Localized lymphadenopathy. Axillary  General Axillary Region: Bilateral - Description - No Localized lymphadenopathy. Femoral & Inguinal  Generalized Femoral &  Inguinal Lymphatics: Left - Description - No Localized lymphadenopathy. Right - Description - No Localized lymphadenopathy.    Assessment & Plan [name] MD; 12/03/2018 10:39 AM)  SIGMOID DIVERTICULITIS (K57.32) Story: Mr. Ray Brown is a very pleasant [phone] with hx of HTN, HLD, GERD, CAD, PAD (s/p AFB 2003, Dr. Donnetta Hutching) on plavix -  here for evaluation of recent distal sigmoid diverticulitis - ~2 prior attacks. Last colonoscopy 5 yrs ago, due for repeat as has hx of polyps Impression: Repeated episodes of sigmoid diverticulitis for several months with a rehospitalization due to microperforation and abscess. Finally improved on Invanz.  Think he would benefit from segmental resection. Minimally invasive approach. This most recent attack seems to have calm down to the point that hopefully we can get this set up 6 weeks from discharge = November.  Ideally would set up colonoscopy the day before surgery given his history of colon polyps through [facility] gastroenterology. See if we can coordinate it.  Current Plans I recommended obtaining preoperative cardiac clearance for recommendations on management of anticoagualtion perioperatively:  1. Timing of holding anticoagulation 2. Need for any bridge therapy (SQ enoxaparin, IV heparin, IV Aggrastat, etc) preop/postop. 3. Desired timing of resumption of anticoagulation.  In general from Dr. Johney Maine' standpoint:  Aspirin is okay to continue perioperatively (81mg  or 325mg ) & does not need to be held  Hold warfarin 5 dayspreoperatively. Consider PT/INR level on arrival to short stay the day of surgery. No need to check PT/INR level on preop visit  Hold P2Y12 inibitors such as clopidrogel (Plavix) 4 dayspreoperatively  Hold direct thrombin / factor Xa inhibitors (Xerelto, Pradaxa, Eliquis, etc) 2 dayspreoperatively   Request clearance by cardiology to better assess operative risk & see if a reevaluation, further workup, etc is  needed. Also recommendations on how medications such as for anticoagulation and blood pressure should be managed/held/restarted after surgery.  Addendum: Dr. [name] did a telehealth with visit with the patient. While the patient is of moderate risks, he agrees surgery is warranted. Okay to come off Plavix 5 days prior to surgery   HISTORY OF ADENOMATOUS POLYP OF COLON (Z86.010)  Current Plans Pt Education - Polyps in the Colon and Rectum (Colonic and Rectal Polyps): colonic polyps  PREOP COLON - ENCOUNTER FOR PREOPERATIVE EXAMINATION FOR GENERAL SURGICAL PROCEDURE (Z01.818)  Current Plans You are being scheduled for surgery- Our schedulers will call you.  You should hear from our office's scheduling department within 5 working days about the location, date, and time of surgery. We try to make accommodations for patient's preferences in scheduling surgery, but sometimes the OR schedule or the surgeon's schedule prevents [place] from making those accommodations.  If you have not heard from our office [phone]) in 5 working days, call the office and ask for your surgeon's nurse.  If you have other questions about your diagnosis, plan, or surgery, call the office and ask for your surgeon's nurse.  Written instructions provided The anatomy & physiology of the digestive tract was discussed. The pathophysiology of the colon was discussed. Natural history risks without surgery was discussed. I feel the risks of no intervention will lead to serious problems that outweigh the operative risks; therefore, I recommended a partial colectomy to remove the pathology. Minimally invasive (Robotic/Laparoscopic) & open techniques were discussed.  Risks such as bleeding, infection, abscess, leak, reoperation, possible ostomy, hernia, heart attack, death, and other risks were discussed. I noted a good likelihood this will help address the problem. Goals of post-operative recovery were discussed as  well. Need for adequate nutrition, daily bowel regimen and healthy physical activity, to optimize recovery was noted as well. We will work to minimize complications. Educational materials were available as well. Questions were answered. The patient expresses understanding & wishes to proceed with surgery.  Pt Education - [facility] Colon Bowel Prep 2018 ERAS/Miralax/Antibiotics Started  Neomycin Sulfate 500 MG Oral Tablet, 2 (two) Tablet SEE NOTE, #6, 12/03/2018, No Refill. Local Order: Pharmacist Notes: TAKE TWO TABLETS AT 2 PM, 3 PM, AND 10 PM THE DAY PRIOR TO SURGERY Started Flagyl 500 MG Oral Tablet, 2 (two) Tablet SEE NOTE, #6, 12/03/2018, No Refill. Local Order: Pharmacist Notes: Take at 2pm, 3pm, and 10pm the day prior to your colon operation Pt Education - Pamphlet Given - Laparoscopic Colorectal Surgery: discussed with patient and provided information. Pt Education - [facility] Colectomy post-op instructions: discussed with patient and provided information. Started Ondansetron HCl 4 MG Oral Tablet, 1 (one) Tablet four times daily, as needed, #8, 12/03/2018, Ref. Kathrene Bongo, MD, FACS, MASCRS Gastrointestinal and Minimally Invasive Surgery    [address] [place], [place] [address] [phone] Main / Paging [phone] Fax

## 2018-12-12 ENCOUNTER — Other Ambulatory Visit: Payer: Self-pay | Admitting: Cardiology

## 2018-12-28 ENCOUNTER — Emergency Department (HOSPITAL_COMMUNITY): Payer: Medicare Other

## 2018-12-28 ENCOUNTER — Other Ambulatory Visit: Payer: Self-pay

## 2018-12-28 ENCOUNTER — Encounter (HOSPITAL_COMMUNITY): Payer: Self-pay

## 2018-12-28 ENCOUNTER — Inpatient Hospital Stay (HOSPITAL_COMMUNITY)
Admission: EM | Admit: 2018-12-28 | Discharge: 2019-01-01 | DRG: 392 | Disposition: A | Discharge status: Home / Self Care | Payer: Medicare Other | Attending: Internal Medicine | Admitting: Internal Medicine

## 2018-12-28 DIAGNOSIS — E669 Obesity, unspecified: Secondary | ICD-10-CM | POA: Diagnosis present

## 2018-12-28 DIAGNOSIS — Z8249 Family history of ischemic heart disease and other diseases of the circulatory system: Secondary | ICD-10-CM

## 2018-12-28 DIAGNOSIS — E1151 Type 2 diabetes mellitus with diabetic peripheral angiopathy without gangrene: Secondary | ICD-10-CM | POA: Diagnosis present

## 2018-12-28 DIAGNOSIS — Z7902 Long term (current) use of antithrombotics/antiplatelets: Secondary | ICD-10-CM

## 2018-12-28 DIAGNOSIS — E860 Dehydration: Secondary | ICD-10-CM | POA: Diagnosis present

## 2018-12-28 DIAGNOSIS — Z95828 Presence of other vascular implants and grafts: Secondary | ICD-10-CM

## 2018-12-28 DIAGNOSIS — G4733 Obstructive sleep apnea (adult) (pediatric): Secondary | ICD-10-CM | POA: Diagnosis present

## 2018-12-28 DIAGNOSIS — Z881 Allergy status to other antibiotic agents status: Secondary | ICD-10-CM

## 2018-12-28 DIAGNOSIS — E876 Hypokalemia: Secondary | ICD-10-CM | POA: Diagnosis present

## 2018-12-28 DIAGNOSIS — I2510 Atherosclerotic heart disease of native coronary artery without angina pectoris: Secondary | ICD-10-CM | POA: Diagnosis present

## 2018-12-28 DIAGNOSIS — E7800 Pure hypercholesterolemia, unspecified: Secondary | ICD-10-CM | POA: Diagnosis present

## 2018-12-28 DIAGNOSIS — M5442 Lumbago with sciatica, left side: Secondary | ICD-10-CM

## 2018-12-28 DIAGNOSIS — E785 Hyperlipidemia, unspecified: Secondary | ICD-10-CM | POA: Diagnosis present

## 2018-12-28 DIAGNOSIS — E440 Moderate protein-calorie malnutrition: Secondary | ICD-10-CM | POA: Diagnosis present

## 2018-12-28 DIAGNOSIS — Z888 Allergy status to other drugs, medicaments and biological substances status: Secondary | ICD-10-CM

## 2018-12-28 DIAGNOSIS — I739 Peripheral vascular disease, unspecified: Secondary | ICD-10-CM | POA: Diagnosis present

## 2018-12-28 DIAGNOSIS — Z20828 Contact with and (suspected) exposure to other viral communicable diseases: Secondary | ICD-10-CM | POA: Diagnosis present

## 2018-12-28 DIAGNOSIS — K449 Diaphragmatic hernia without obstruction or gangrene: Secondary | ICD-10-CM | POA: Diagnosis present

## 2018-12-28 DIAGNOSIS — K5792 Diverticulitis of intestine, part unspecified, without perforation or abscess without bleeding: Secondary | ICD-10-CM | POA: Diagnosis present

## 2018-12-28 DIAGNOSIS — M549 Dorsalgia, unspecified: Secondary | ICD-10-CM | POA: Diagnosis present

## 2018-12-28 DIAGNOSIS — Z6826 Body mass index (BMI) 26.0-26.9, adult: Secondary | ICD-10-CM

## 2018-12-28 DIAGNOSIS — R112 Nausea with vomiting, unspecified: Secondary | ICD-10-CM

## 2018-12-28 DIAGNOSIS — I10 Essential (primary) hypertension: Secondary | ICD-10-CM | POA: Diagnosis present

## 2018-12-28 DIAGNOSIS — Z955 Presence of coronary angioplasty implant and graft: Secondary | ICD-10-CM | POA: Diagnosis not present

## 2018-12-28 DIAGNOSIS — G8929 Other chronic pain: Secondary | ICD-10-CM | POA: Diagnosis present

## 2018-12-28 DIAGNOSIS — Z7982 Long term (current) use of aspirin: Secondary | ICD-10-CM | POA: Diagnosis not present

## 2018-12-28 DIAGNOSIS — H8110 Benign paroxysmal vertigo, unspecified ear: Secondary | ICD-10-CM | POA: Diagnosis present

## 2018-12-28 DIAGNOSIS — K219 Gastro-esophageal reflux disease without esophagitis: Secondary | ICD-10-CM | POA: Diagnosis present

## 2018-12-28 DIAGNOSIS — Z808 Family history of malignant neoplasm of other organs or systems: Secondary | ICD-10-CM | POA: Diagnosis not present

## 2018-12-28 DIAGNOSIS — Z87891 Personal history of nicotine dependence: Secondary | ICD-10-CM | POA: Diagnosis not present

## 2018-12-28 DIAGNOSIS — Z7901 Long term (current) use of anticoagulants: Secondary | ICD-10-CM

## 2018-12-28 DIAGNOSIS — T82855A Stenosis of coronary artery stent, initial encounter: Secondary | ICD-10-CM

## 2018-12-28 DIAGNOSIS — K5720 Diverticulitis of large intestine with perforation and abscess without bleeding: Principal | ICD-10-CM | POA: Diagnosis present

## 2018-12-28 DIAGNOSIS — Z800 Family history of malignant neoplasm of digestive organs: Secondary | ICD-10-CM | POA: Diagnosis not present

## 2018-12-28 DIAGNOSIS — D638 Anemia in other chronic diseases classified elsewhere: Secondary | ICD-10-CM | POA: Diagnosis present

## 2018-12-28 LAB — CBC WITH DIFFERENTIAL/PLATELET
Abs Immature Granulocytes: 0.04 10*3/uL (ref 0.00–0.07)
Basophils Absolute: 0 10*3/uL (ref 0.0–0.1)
Basophils Relative: 0 %
Eosinophils Absolute: 0.1 10*3/uL (ref 0.0–0.5)
Eosinophils Relative: 1 %
HCT: 47.9 % (ref 39.0–52.0)
Hemoglobin: 15.9 g/dL (ref 13.0–17.0)
Immature Granulocytes: 0 %
Lymphocytes Relative: 16 %
Lymphs Abs: 1.9 10*3/uL (ref 0.7–4.0)
MCH: 31.3 pg (ref 26.0–34.0)
MCHC: 33.2 g/dL (ref 30.0–36.0)
MCV: 94.3 fL (ref 80.0–100.0)
Monocytes Absolute: 0.8 10*3/uL (ref 0.1–1.0)
Monocytes Relative: 7 %
Neutro Abs: 9.1 10*3/uL — ABNORMAL HIGH (ref 1.7–7.7)
Neutrophils Relative %: 76 %
Platelets: 311 10*3/uL (ref 150–400)
RBC: 5.08 MIL/uL (ref 4.22–5.81)
RDW: 12.9 % (ref 11.5–15.5)
WBC: 11.9 10*3/uL — ABNORMAL HIGH (ref 4.0–10.5)
nRBC: 0 % (ref 0.0–0.2)

## 2018-12-28 LAB — URINALYSIS, ROUTINE W REFLEX MICROSCOPIC
Bacteria, UA: NONE SEEN
Bilirubin Urine: NEGATIVE
Glucose, UA: 50 mg/dL — AB
Hgb urine dipstick: NEGATIVE
Ketones, ur: 5 mg/dL — AB
Leukocytes,Ua: NEGATIVE
Nitrite: NEGATIVE
Protein, ur: >=300 mg/dL — AB
Specific Gravity, Urine: 1.027 (ref 1.005–1.030)
pH: 5 (ref 5.0–8.0)

## 2018-12-28 LAB — COMPREHENSIVE METABOLIC PANEL
ALT: 16 U/L (ref 0–44)
AST: 26 U/L (ref 15–41)
Albumin: 4 g/dL (ref 3.5–5.0)
Alkaline Phosphatase: 88 U/L (ref 38–126)
Anion gap: 16 — ABNORMAL HIGH (ref 5–15)
BUN: 11 mg/dL (ref 8–23)
CO2: 23 mmol/L (ref 22–32)
Calcium: 9.4 mg/dL (ref 8.9–10.3)
Chloride: 104 mmol/L (ref 98–111)
Creatinine, Ser: 0.8 mg/dL (ref 0.61–1.24)
GFR calc Af Amer: >60 mL/min (ref >60)
GFR calc non Af Amer: >60 mL/min (ref >60)
Glucose, Bld: 134 mg/dL — ABNORMAL HIGH (ref 70–99)
Potassium: 3.4 mmol/L — ABNORMAL LOW (ref 3.5–5.1)
Sodium: 143 mmol/L (ref 135–145)
Total Bilirubin: 1 mg/dL (ref 0.3–1.2)
Total Protein: 7.4 g/dL (ref 6.5–8.1)

## 2018-12-28 LAB — LIPASE, BLOOD: Lipase: 25 U/L (ref 11–51)

## 2018-12-28 MED ORDER — SODIUM CHLORIDE 0.9 % IV SOLN: 100.0000 mg | INTRAVENOUS | Status: DC

## 2018-12-28 MED ORDER — BISACODYL 10 MG RE SUPP: 10.0000 mg | Freq: Two times a day (BID) | RECTAL | Status: DC | PRN

## 2018-12-28 MED ORDER — SODIUM CHLORIDE 0.9 % IV BOLUS
1000.0000 mL | Freq: Once | INTRAVENOUS | Status: AC
  Administered 2018-12-28: 21:00:00 1000 mL via INTRAVENOUS

## 2018-12-28 MED ORDER — SODIUM CHLORIDE 0.9 % IV SOLN: 1.0000 g | Freq: Once | INTRAVENOUS | Status: DC

## 2018-12-28 MED ORDER — LIP MEDEX EX OINT
1.0000 "application " | TOPICAL_OINTMENT | Freq: Two times a day (BID) | CUTANEOUS | Status: DC
  Administered 2018-12-29 – 2018-12-31 (×5): 1 via TOPICAL
  Filled 2018-12-28 (×2): qty 7

## 2018-12-28 MED ORDER — ONDANSETRON HCL 4 MG/2ML IJ SOLN
4.0000 mg | Freq: Once | INTRAMUSCULAR | Status: AC
  Administered 2018-12-28: 19:00:00 4 mg via INTRAVENOUS
  Filled 2018-12-28: qty 2

## 2018-12-28 MED ORDER — ACETAMINOPHEN 325 MG PO TABS: 650.0000 mg | ORAL_TABLET | Freq: Four times a day (QID) | ORAL | Status: DC | PRN

## 2018-12-28 MED ORDER — SODIUM CHLORIDE 0.9 % IV SOLN
100.0000 mg | INTRAVENOUS | Status: DC
  Administered 2018-12-29 – 2018-12-31 (×3): 100 mg via INTRAVENOUS
  Filled 2018-12-28 (×4): qty 100

## 2018-12-28 MED ORDER — SODIUM CHLORIDE 0.9 % IV SOLN
INTRAVENOUS | Status: DC
  Filled 2018-12-28: qty 6

## 2018-12-28 MED ORDER — METRONIDAZOLE 500 MG PO TABS: 1000.0000 mg | ORAL_TABLET | ORAL | Status: DC

## 2018-12-28 MED ORDER — METHOCARBAMOL 1000 MG/10ML IJ SOLN
1000.0000 mg | Freq: Four times a day (QID) | INTRAVENOUS | Status: DC | PRN
  Filled 2018-12-28: qty 10

## 2018-12-28 MED ORDER — ONDANSETRON HCL 4 MG/2ML IJ SOLN
4.0000 mg | Freq: Four times a day (QID) | INTRAMUSCULAR | Status: DC | PRN
  Administered 2018-12-28: 22:00:00 4 mg via INTRAVENOUS
  Filled 2018-12-28: qty 2

## 2018-12-28 MED ORDER — NEOMYCIN SULFATE 500 MG PO TABS: 1000.0000 mg | ORAL_TABLET | ORAL | Status: DC

## 2018-12-28 MED ORDER — LACTATED RINGERS IV BOLUS
1000.0000 mL | Freq: Once | INTRAVENOUS | Status: AC
  Administered 2018-12-28: 1000 mL via INTRAVENOUS

## 2018-12-28 MED ORDER — ACETAMINOPHEN 500 MG PO TABS: 1000.0000 mg | ORAL_TABLET | ORAL | Status: DC

## 2018-12-28 MED ORDER — ONDANSETRON HCL 4 MG PO TABS: 4.0000 mg | ORAL_TABLET | Freq: Four times a day (QID) | ORAL | Status: DC | PRN

## 2018-12-28 MED ORDER — ALVIMOPAN 12 MG PO CAPS
12.0000 mg | ORAL_CAPSULE | ORAL | Status: DC
  Filled 2018-12-28: qty 1

## 2018-12-28 MED ORDER — ALUM & MAG HYDROXIDE-SIMETH 200-200-20 MG/5ML PO SUSP: 30.0000 mL | Freq: Four times a day (QID) | ORAL | Status: DC | PRN

## 2018-12-28 MED ORDER — PROCHLORPERAZINE EDISYLATE 10 MG/2ML IJ SOLN
5.0000 mg | INTRAMUSCULAR | Status: DC | PRN
  Administered 2018-12-29: 10 mg via INTRAVENOUS
  Filled 2018-12-28: qty 2

## 2018-12-28 MED ORDER — HYDROCORTISONE (PERIANAL) 2.5 % EX CREA
1.0000 "application " | TOPICAL_CREAM | Freq: Four times a day (QID) | CUTANEOUS | Status: DC | PRN
  Filled 2018-12-28: qty 28.35

## 2018-12-28 MED ORDER — PANTOPRAZOLE SODIUM 40 MG IV SOLR
40.0000 mg | Freq: Every day | INTRAVENOUS | Status: DC
  Administered 2018-12-29 (×2): 40 mg via INTRAVENOUS
  Filled 2018-12-28 (×2): qty 40

## 2018-12-28 MED ORDER — POTASSIUM CHLORIDE 10 MEQ/100ML IV SOLN
10.0000 meq | INTRAVENOUS | Status: DC
  Administered 2018-12-28 (×2): 10 meq via INTRAVENOUS
  Filled 2018-12-28 (×2): qty 100

## 2018-12-28 MED ORDER — MAGIC MOUTHWASH
15.0000 mL | Freq: Four times a day (QID) | ORAL | Status: DC | PRN
  Filled 2018-12-28: qty 15

## 2018-12-28 MED ORDER — SODIUM CHLORIDE 0.9 % IV SOLN
2.0000 g | INTRAVENOUS | Status: DC
  Filled 2018-12-28: qty 2

## 2018-12-28 MED ORDER — ACETAMINOPHEN 650 MG RE SUPP: 650.0000 mg | Freq: Four times a day (QID) | RECTAL | Status: DC | PRN

## 2018-12-28 MED ORDER — SODIUM CHLORIDE 0.9 % IV SOLN
8.0000 mg | Freq: Four times a day (QID) | INTRAVENOUS | Status: DC | PRN
  Filled 2018-12-28: qty 4

## 2018-12-28 MED ORDER — SODIUM CHLORIDE (PF) 0.9 % IJ SOLN
INTRAMUSCULAR | Status: AC
  Filled 2018-12-28: qty 50

## 2018-12-28 MED ORDER — HYDROCORTISONE 1 % EX CREA
1.0000 "application " | TOPICAL_CREAM | Freq: Three times a day (TID) | CUTANEOUS | Status: DC | PRN
  Filled 2018-12-28: qty 28

## 2018-12-28 MED ORDER — ONDANSETRON HCL 4 MG/2ML IJ SOLN: 4.0000 mg | Freq: Four times a day (QID) | INTRAMUSCULAR | Status: DC | PRN

## 2018-12-28 MED ORDER — SODIUM CHLORIDE 0.9 % IV SOLN
200.0000 mg | Freq: Once | INTRAVENOUS | Status: AC
  Administered 2018-12-28: 23:00:00 200 mg via INTRAVENOUS
  Filled 2018-12-28: qty 200

## 2018-12-28 MED ORDER — POLYETHYLENE GLYCOL 3350 17 GM/SCOOP PO POWD: 1.0000 | Freq: Once | ORAL | Status: DC

## 2018-12-28 MED ORDER — PIPERACILLIN-TAZOBACTAM 3.375 G IVPB: 3.3750 g | Freq: Three times a day (TID) | INTRAVENOUS | Status: DC

## 2018-12-28 MED ORDER — SODIUM CHLORIDE 0.9% FLUSH
3.0000 mL | Freq: Once | INTRAVENOUS | Status: AC
  Administered 2018-12-28: 3 mL via INTRAVENOUS

## 2018-12-28 MED ORDER — IOHEXOL 300 MG/ML  SOLN
100.0000 mL | Freq: Once | INTRAMUSCULAR | Status: AC | PRN
  Administered 2018-12-28: 19:00:00 100 mL via INTRAVENOUS

## 2018-12-28 MED ORDER — SODIUM CHLORIDE 0.9 % IV BOLUS
1000.0000 mL | Freq: Once | INTRAVENOUS | Status: AC
  Administered 2018-12-28: 17:00:00 1000 mL via INTRAVENOUS

## 2018-12-28 MED ORDER — GABAPENTIN 300 MG PO CAPS: 300.0000 mg | ORAL_CAPSULE | ORAL | Status: DC

## 2018-12-28 MED ORDER — BUPIVACAINE LIPOSOME 1.3 % IJ SUSP
20.0000 mL | Freq: Once | INTRAMUSCULAR | Status: DC
  Filled 2018-12-28: qty 20

## 2018-12-28 MED ORDER — MENTHOL 3 MG MT LOZG
1.0000 | LOZENGE | OROMUCOSAL | Status: DC | PRN
  Filled 2018-12-28: qty 9

## 2018-12-28 MED ORDER — BISACODYL 5 MG PO TBEC: 20.0000 mg | DELAYED_RELEASE_TABLET | Freq: Once | ORAL | Status: DC

## 2018-12-28 MED ORDER — SODIUM CHLORIDE 0.9 % IV SOLN
1.0000 g | Freq: Once | INTRAVENOUS | Status: DC
  Filled 2018-12-28: qty 1

## 2018-12-28 MED ORDER — ONDANSETRON HCL 4 MG/2ML IJ SOLN
4.0000 mg | Freq: Once | INTRAMUSCULAR | Status: AC
  Administered 2018-12-28: 4 mg via INTRAVENOUS
  Filled 2018-12-28: qty 2

## 2018-12-28 MED ORDER — PHENOL 1.4 % MT LIQD
1.0000 | OROMUCOSAL | Status: DC | PRN
  Filled 2018-12-28: qty 177

## 2018-12-28 MED ORDER — GUAIFENESIN-DM 100-10 MG/5ML PO SYRP: 10.0000 mL | ORAL_SOLUTION | ORAL | Status: DC | PRN

## 2018-12-28 MED ORDER — ENOXAPARIN SODIUM 40 MG/0.4ML ~~LOC~~ SOLN: 40.0000 mg | Freq: Once | SUBCUTANEOUS | Status: DC

## 2018-12-28 MED ORDER — LACTATED RINGERS IV BOLUS: 1000.0000 mL | Freq: Three times a day (TID) | INTRAVENOUS | Status: DC | PRN

## 2018-12-28 MED ORDER — SODIUM CHLORIDE 0.9 % IV SOLN
1.0000 g | Freq: Three times a day (TID) | INTRAVENOUS | Status: DC
  Administered 2018-12-29 – 2019-01-01 (×12): 1 g via INTRAVENOUS
  Filled 2018-12-28 (×14): qty 1

## 2018-12-28 NOTE — ED Notes (Signed)
Called 5E to give report, RN-Tom unavailable to take report at this time. Will call back.

## 2018-12-28 NOTE — ED Provider Notes (Signed)
[facility] DEPT Provider Note   CSN: [id number] Arrival date & time: 12/28/18  1507     History   Chief Complaint Chief Complaint  Patient presents with   Diverticulitis   Emesis   Weakness    HPI CEASER GARAY is a 68 y.o. male.     Patient with history of peripheral vascular disease status post aortobifemoral bypass, coronary artery disease status post stenting, recent treatment for perforated diverticulitis diagnosed in 07/2018 with recurrence requiring hospitalization and course of IV Invanz in 10/2018 completed in 11/2018.  Patient is currently undergoing medical clearance for partial colectomy.  Patient states that he has good days and bad days as far as nausea and vomiting is concerned.  Starting yesterday has had pretty persistent and severe nausea and vomiting.  He has had some constipation recently.  He denies fevers, chest pain, shortness of breath.  He states that he does not really have pain with flares of diverticulitis.  Overall he feels generally weak and dehydrated.  He has been taking Zofran without improvement.  Onset of symptoms acute.  Course is waxing and waning but overall worsening.     Past Medical History:  Diagnosis Date   Arthritis    Bronchitis    Chronic back pain    Complication of anesthesia    difficulty breathing s/p back surgery at [facility] 2013   Coronary artery disease    Diabetes mellitus without complication (HCC)    Dry skin    in the back of head-left side   GERD (gastroesophageal reflux disease)    H/O hiatal hernia    Hypercholesteremia    Peripheral vascular disease (HCC)    Pneumonia    Shortness of breath    Sleep apnea    has CPAP but does not use    Patient Active Problem List   Diagnosis Date Noted   BPPV (benign paroxysmal positional vertigo) 11/10/2018   Diverticulitis of colon with perforation 11/03/2018   Dyslipidemia 11/03/2018   OSA (obstructive sleep apnea)  11/03/2018   Unstable angina ([place]) 12/05/2014   Dyspnea 12/05/2014   Dizziness 12/05/2014   Diabetes mellitus with peripheral artery disease ([place]) 10/04/2013   Coronary artery disease involving native coronary artery of native heart without angina pectoris 10/04/2013   Impingement syndrome of right shoulder 03/28/2013   Hyperlipemia 01/21/2013   Obesity 01/21/2013   Coronary artery disease    Peripheral vascular disease ([place])     Past Surgical History:  Procedure Laterality Date   aortic bypass  2003   aortobifemoral bypass   BACK SURGERY  2004   CARDIAC CATHETERIZATION  2010   x 2 stents RCA   CARDIAC CATHETERIZATION N/A 12/08/2014   Procedure: Left Heart Cath and Coronary Angiography;  Surgeon: Lorretta Harp, MD;  Location: [facility];  Service: Cardiovascular;  Laterality: N/A;   COLONOSCOPY  08/04/2009   CORONARY ANGIOPLASTY     2 stents   FOOT SURGERY  1980s   right foot   SHOULDER ARTHROSCOPY WITH SUBACROMIAL DECOMPRESSION Right 03/28/2013   Procedure: RIGHT SHOULDER ARTHROSCOPY WITH SUBACROMIAL DECOMPRESSION AND DISTAL CLAVICLE RESECTION;  Surgeon: Marin Shutter, MD;  Location: [facility];  Service: Orthopedics;  Laterality: Right;   SHOULDER SURGERY     left shoulder rotator cuff repair   UPPER GI ENDOSCOPY          Home Medications    Prior to Admission medications   Medication Sig Start Date End Date Taking?  Authorizing Provider  aspirin EC 81 MG tablet Take 81 mg by mouth at bedtime.    [provider]  atorvastatin (LIPITOR) 80 MG tablet TAKE 1 TABLET BY MOUTH AT  BEDTIME 12/13/18   [name], MD  clopidogrel (PLAVIX) 75 MG tablet TAKE 1 TABLET BY MOUTH AT  BEDTIME 12/13/18   [name], MD  fenofibrate 160 MG tablet Take 160 mg by mouth at bedtime.    [provider]  metoprolol succinate (TOPROL-XL) 25 MG 24 hr tablet TAKE 1 TABLET BY MOUTH  DAILY 12/13/18   [name], MD  nitroGLYCERIN (NITROSTAT) 0.4  MG SL tablet Place 1 tablet (0.4 mg total) under the tongue every 5 (five) minutes as needed for up to 25 days. For chest pain 11/12/18 12/07/18  [name], MD  pantoprazole (PROTONIX) 40 MG tablet Take 40 mg by mouth at bedtime.    [provider]  traMADol (ULTRAM) 50 MG tablet Take 50 mg by mouth every 12 (twelve) hours as needed for moderate pain.  06/20/16   [provider]    Family History Family History  Problem Relation Age of Onset   Colon cancer Mother    Heart attack Father        , massive   Coronary artery disease Brother    Thyroid cancer Brother     Social History Social History   Tobacco Use   Smoking status: Former Smoker    Years: 35.00    Types: Cigarettes   Smokeless tobacco: Never Used  Substance Use Topics   Alcohol use: No   Drug use: No     Allergies   Ciprofloxacin and Daypro [oxaprozin]   Review of Systems Review of Systems  Constitutional: Negative for fever.  HENT: Negative for rhinorrhea and sore throat.   Eyes: Negative for redness.  Respiratory: Negative for cough.   Cardiovascular: Negative for chest pain.  Gastrointestinal: Positive for abdominal pain, constipation, nausea and vomiting. Negative for diarrhea.  Genitourinary: Negative for dysuria.  Musculoskeletal: Negative for myalgias.  Skin: Negative for rash.  Neurological: Positive for weakness. Negative for headaches.     Physical Exam Updated Vital Signs BP 110/80 (BP Location: Left Arm)    Pulse (!) 106    Temp 98.4 F (36.9 C) (Oral)    Resp 19    Ht 5\' 8"  (1.727 m)    Wt 78 kg    SpO2 98%    BMI 26.15 kg/m   Physical Exam Vitals signs and nursing note reviewed.  Constitutional:      Appearance: He is well-developed.  HENT:     Head: Normocephalic and atraumatic.     Mouth/Throat:     Mouth: Mucous membranes are dry.  Eyes:     General:        Right eye: No discharge.        Left eye: No discharge.     Conjunctiva/sclera:  Conjunctivae normal.  Neck:     Musculoskeletal: Normal range of motion and neck supple.  Cardiovascular:     Rate and Rhythm: Regular rhythm. Tachycardia present.     Heart sounds: Normal heart sounds.  Pulmonary:     Effort: Pulmonary effort is normal.     Breath sounds: Normal breath sounds.  Abdominal:     Palpations: Abdomen is soft.     Tenderness: There is no abdominal tenderness. There is no guarding.  Skin:    General: Skin is warm and dry.  Neurological:     Mental Status: He is alert.      ED Treatments / Results  Labs (all labs ordered are listed, but only abnormal results are displayed) Labs Reviewed  COMPREHENSIVE METABOLIC PANEL - Abnormal; Notable for the following components:      Result Value   Potassium 3.4 (*)    Glucose, Bld 134 (*)    Anion gap 16 (*)    All other components within normal limits  URINALYSIS, ROUTINE W REFLEX MICROSCOPIC - Abnormal; Notable for the following components:   Color, Urine AMBER (*)    APPearance HAZY (*)    Glucose, UA 50 (*)    Ketones, ur 5 (*)    Protein, ur >=300 (*)    All other components within normal limits  CBC WITH DIFFERENTIAL/PLATELET - Abnormal; Notable for the following components:   WBC 11.9 (*)    Neutro Abs 9.1 (*)    All other components within normal limits  SARS CORONAVIRUS 2 (TAT 6-24 HRS)  LIPASE, BLOOD  MAGNESIUM  PREALBUMIN    EKG None  Radiology Ct Abdomen Pelvis W Contrast  Result Date: 12/28/2018 CLINICAL DATA:  Diverticulitis EXAM: CT ABDOMEN AND PELVIS WITH CONTRAST TECHNIQUE: Multidetector CT imaging of the abdomen and pelvis was performed using the standard protocol following bolus administration of intravenous contrast. CONTRAST:  121mL OMNIPAQUE IOHEXOL 300 MG/ML  SOLN COMPARISON:  November 03, 2018 FINDINGS: Lower chest: The visualized heart size within normal limits. No pericardial fluid/thickening. No hiatal hernia. The visualized portions of the lungs are clear. Hepatobiliary:  The liver is normal in density without focal abnormality.The main portal vein is patent. No evidence of calcified gallstones, gallbladder wall thickening or biliary dilatation. Pancreas: Unremarkable. No pancreatic ductal dilatation or surrounding inflammatory changes. Spleen: Normal in size without focal abnormality. Adrenals/Urinary Tract: Both adrenal glands appear normal. Tiny low-density lesions are seen within both kidneys as on the prior exam. Stomach/Bowel: The stomach and small bowel are normal in appearance. Again noted is a focal segment of distal sigmoid colon diverticulitis with diffuse bowel wall thickening. Tiny foci of free air are again noted superior to area at with non loculated fluid. There likely increased appearance of the non loculated collection since the prior exam the appendix is unremarkable. Vascular/Lymphatic: There are no enlarged mesenteric, retroperitoneal, or pelvic lymph nodes. Scattered aortic atherosclerotic calcifications are seen without aneurysmal dilatation. Again noted is a aorto bi femoral stent graft. Reproductive: The prostate is heterogeneous in appearance. Other: No evidence of abdominal wall mass or hernia. Musculoskeletal: No acute or significant osseous findings. Prior posterior lumbar spine fixation is seen. IMPRESSION: Again noted is distal sigmoid diverticulitis with adjacent micro perforation non loculated fluid which could represent phlegmon. The non-loculated collection appears slightly more prominent than the prior exam. Electronically Signed   By: Prudencio Pair M.D.   On: 12/28/2018 19:51    Procedures Procedures (including critical care time)  Medications Ordered in ED Medications  sodium chloride flush (NS) 0.9 % injection 3 mL (3 mLs Intravenous Given 12/28/18 1607)  sodium chloride 0.9 % bolus 1,000 mL (1,000 mLs Intravenous New Bag/Given 12/28/18 1658)  ondansetron (ZOFRAN) injection 4 mg (4 mg Intravenous Given 12/28/18 1654)     Initial  Impression / Assessment and Plan / ED Course  I have reviewed the triage vital signs and the nursing notes.  Pertinent labs & imaging results that were available during my care of the patient were reviewed by me and considered in my medical  decision making (see chart for details).        Patient seen and examined. Work-up initiated. Medications ordered.  Reviewed previous hospitalization records, cardiology note, surgery note.  Will give liter bolus and check lab work.  No pain on palpation however patient does not seem to get a lot of pain with his flares of diverticulitis.  Vital signs reviewed and are as follows: BP 110/80 (BP Location: Left Arm)    Pulse (!) 106    Temp 98.4 F (36.9 C) (Oral)    Resp 19    Ht 5\' 8"  (1.727 m)    Wt 78 kg    SpO2 98%    BMI 26.15 kg/m   5:59 PM CT ordered given tachycardia and leukocytosis.  Also patient continues to look uncomfortable.  CT imaging is needed to evaluate current status of patient's chronic recurrent diverticulitis.  9:32 PM patient discussed with and seen by Dr. Ashok Cordia.   CT with worsening. Patient still complaining of nausea.  Given CT findings will admit to the hospital, restart antibiotics.  I spoke with Dr. Hal Hope who will see patient.  Requests consult from general surgery.  I spoke with Dr. Johney Maine of general surgery who is aware patient is in the emergency department and will consult in the morning.  No further instructions.  Final Clinical Impressions(s) / ED Diagnoses   Final diagnoses:  Diverticulitis of large intestine with perforation without bleeding   Patient with recurrent/chronic diverticulitis of the sigmoid colon.  CT, lab work with some worsening.  Admit.  ED Discharge Orders    None       Carlisle Cater, Hershal Coria 12/28/18 2135    Lajean Saver, MD 12/28/18 2318

## 2018-12-28 NOTE — Consult Note (Addendum)
Ray Brown  [birth year] [id number]  CARE TEAM:  PCP: [name], MD  Outpatient Care Team: Patient Care Team: [name], MD as PCP - General (Family Medicine) [name], MD as PCP - Cardiology (Cardiology) Early, [name], MD as Consulting Physician (Vascular Surgery) [name], MD as Consulting Physician (Orthopedic Surgery) [name], MD as Consulting Physician (Cardiology) [name], MD as Consulting Physician (Colon and Rectal Surgery) [name], MD as Consulting Physician (Gastroenterology)  Inpatient Treatment Team: Treatment Team: Attending Provider: Rise Patience, MD; Technician: Rebeca Alert, NT; Student Physician's Assistant: Tracey Harries, [place]; Registered Nurse: Ardelle Lesches, RN; Technician: Stana Bunting, EMT; Rounding Team: Jackelyn Knife, MD; Consulting Physician: [name], Md, MD   This patient is a 68 y.o.male who presents today for surgical evaluation at the request of Dr Ashok Cordia, [facility] ED.   Chief complaint / Reason for evaluation: Chronic diverticulitis with recurrent nausea and vomiting  Gentleman with multiple medical problems including significant coronary disease requiring stenting and restenting.  Progressive arterial ASCVDisease requiring aortobifem bypass and stenting.  Better controlled with aggressive control of hyperlipidemia, hypertension, former tobacco abuse.   Developed pain and diverticulitis.  Has had recurrent episodes.  Declined elective resection in the summer.  Then felt worse and was admitted with microperforation and nondrainable abscess.  Required changing to meropenem to get under control.  Admitted 7 days.  Was discharged on IV antibiotics and clinically improved.  Antibiotics stopped last month.  I saw a few weeks ago.  He was feeling stronger.  We were working to set up colonoscopy and probable elective resection 6 weeks from discharge, later this month.  Patient's wife called  last week noting that he was feeling nauseated.  We recommended liquids and gradually readvance diet.  Prescribed Zofran.  Call back if worse.  Go to ER if worse.  A week later, Friday evening ,they called with concerns that he was having persistent vomiting.  We recommended emergency room reevaluation given his failure to thrive.  Throwing up.  He noted he had been able to have some good days but then the last 24 hours prior to admission he could not keep anything down.  Looks dehydrated and tired.  Not in kidney failure.  Repeat CAT scan shows persistent sigmoid diverticulitis with microperf.  Not massively worse but certainly not improved.  Surgical reevaluation recommended.   Assessment  Ray Brown  68 y.o. male       Problem List:  Principal Problem:   Diverticulitis of colon with perforation Active Problems:   Coronary artery disease involving native coronary artery of native heart without angina pectoris   Coronary stent 2008  restenosis due to progression of disease s/p restent 2010   Peripheral vascular disease ([place])   Obesity   Diabetes mellitus with peripheral artery disease (HCC)   Dyslipidemia   OSA (obstructive sleep apnea)   BPPV (benign paroxysmal positional vertigo)   Chronic anticoagulation   Status post aortobifemoral bypass surgery 2013   Left-sided low back pain with left-sided sciatica   Nausea & vomiting   Worsening nausea and vomiting in the setting of chronic diverticulitis with microperforation  Plan:  While there is no hard indication require emergency surgery, he is failing to thrive.  Medical admission.  IV fluid hydration.  IV antibiotics.  He seemed to tomorrow with the meropenem.  Agree with restarting that.  Add Eraxis for antifungal coverage as well.  Hypokalemia-correct.  Check  magnesium.  Hypertension, diabetes, hyperlipidemia per primary service.  Sleep apnea per primary service.  If this cannot calm down, he may require Hartmann  resection this admission.  This is been simmering for much of this year.  Hopefully can stabilize and turn around and still plan resection later this month when the hopes of the inflammation will calm down more.  Most likely he will need to stay on antibiotics until surgery.  He may require surgery this admission if he does not improve.  Assess prealbumin.  Albumin improved since last discharge.  Nausea and vomiting makes me concerned that clinically he is deteriorated more  -VTE prophylaxis- SCDs, etc -mobilize as tolerated to help recovery  45 minutes spent in review, evaluation, examination, counseling, and coordination of care.  More than 50% of that time was spent in counseling.  [name], MD, FACS, MASCRS Gastrointestinal and Minimally Invasive Surgery  [facility] [address] [place], [place] [address] [phone] Main / Paging [phone] Fax     12/28/2018      Past Medical History:  Diagnosis Date   Arthritis    Bronchitis    Chronic back pain    Complication of anesthesia    difficulty breathing s/p back surgery at [facility] 2013   Coronary artery disease    Diabetes mellitus without complication (HCC)    Dry skin    in the back of head-left side   GERD (gastroesophageal reflux disease)    H/O hiatal hernia    Hypercholesteremia    Peripheral vascular disease (HCC)    Pneumonia    Shortness of breath    Sleep apnea    has CPAP but does not use   Unstable angina ([place]) 12/05/2014    Past Surgical History:  Procedure Laterality Date   AORTA - BILATERAL FEMORAL ARTERY BYPASS GRAFT  2003   Dr Milus Height SURGERY  2004   CARDIAC CATHETERIZATION  2010   x 2 stents RCA   CARDIAC CATHETERIZATION N/A 12/08/2014   Procedure: Left Heart Cath and Coronary Angiography;  Surgeon: Lorretta Harp, MD;  Location: [facility];  Service: Cardiovascular;  Laterality: N/A;   COLONOSCOPY  08/04/2009    CORONARY ANGIOPLASTY     2 stents   FOOT SURGERY  1980s   right foot   SHOULDER ARTHROSCOPY WITH SUBACROMIAL DECOMPRESSION Right 03/28/2013   Procedure: RIGHT SHOULDER ARTHROSCOPY WITH SUBACROMIAL DECOMPRESSION AND DISTAL CLAVICLE RESECTION;  Surgeon: Marin Shutter, MD;  Location: [place];  Service: Orthopedics;  Laterality: Right;   SHOULDER SURGERY     left shoulder rotator cuff repair   UPPER GI ENDOSCOPY      Social History   Socioeconomic History   Marital status: Married    Spouse name: Not on file   Number of children: Not on file   Years of education: Not on file   Highest education level: Not on file  Occupational History   Occupation: retired  Scientist, product/process development strain: 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 file   Food insecurity    Worry: Not on file    Inability: Not on Lexicographer needs    Medical: Not on file    Non-medical: Not on file  Tobacco Use   Smoking status: Former Smoker    Years: 35.00    Types: Cigarettes   Smokeless tobacco: Never Used  Substance and Sexual Activity   Alcohol use: No  Drug use: No   Sexual activity: Not on file  Lifestyle   Physical activity    Days per week: Not on file    Minutes per session: Not on file   Stress: Not on file  Relationships   Social connections    Talks on phone: Not on file    Gets together: Not on file    Attends religious service: Not on file    Active member of club or organization: Not on file    Attends meetings of clubs or organizations: Not on file    Relationship status: Not on file   Intimate partner violence    Fear of current or ex partner: Not on file    Emotionally abused: Not on file    Physically abused: Not on file    Forced sexual activity: Not on file  Other Topics Concern   Not on file  Social History Narrative   Not on file    Family History  Problem Relation Age of Onset   Colon cancer Mother    Heart attack Father        , massive   Coronary artery  disease Brother    Thyroid cancer Brother     Current Facility-Administered Medications  Medication Dose Route Frequency Provider Last Rate Last Dose   acetaminophen (TYLENOL) suppository 650 mg  650 mg Rectal Q6H PRN [name], MD       alum & mag hydroxide-simeth (MAALOX/MYLANTA) 200-200-20 MG/5ML suspension 30 mL  30 mL Oral Q6H PRN [name], MD       anidulafungin (ERAXIS) 100 mg in sodium chloride 0.9 % 100 mL IVPB  100 mg Intravenous Q24H [name], MD       bisacodyl (DULCOLAX) suppository 10 mg  10 mg Rectal Q12H PRN [name], MD       guaiFENesin-dextromethorphan (ROBITUSSIN DM) 100-10 MG/5ML syrup 10 mL  10 mL Oral Q4H PRN [name], MD       hydrocortisone (ANUSOL-HC) 2.5 % rectal cream 1 application  1 application Topical QID PRN [name], MD       hydrocortisone cream 1 % 1 application  1 application Topical TID PRN [name], MD       lactated ringers bolus 1,000 mL  1,000 mL Intravenous Once [name], MD       lactated ringers bolus 1,000 mL  1,000 mL Intravenous Q8H PRN Male Minish, Remo Lipps, MD       lip balm (CARMEX) ointment 1 application  1 application Topical BID [name], MD       magic mouthwash  15 mL Oral QID PRN [name], MD       menthol-cetylpyridinium (CEPACOL) lozenge 3 mg  1 lozenge Oral PRN [name], MD       meropenem (MERREM) 1 g in sodium chloride 0.9 % 100 mL IVPB  1 g Intravenous Tor Netters, MD       methocarbamol (ROBAXIN) 1,000 mg in dextrose 5 % 100 mL IVPB  1,000 mg Intravenous Q6H PRN [name], MD       ondansetron (ZOFRAN) injection 4 mg  4 mg Intravenous Q6H PRN [name], MD       Or   ondansetron (ZOFRAN) 8 mg in sodium chloride 0.9 % 50 mL IVPB  8 mg Intravenous Q6H PRN [name], MD       phenol (CHLORASEPTIC) mouth spray 1-2 spray  1-2 spray Mouth/Throat PRN [name], MD  prochlorperazine (COMPAZINE) injection 5-10 mg  5-10 mg Intravenous Q4H PRN [name], MD       sodium chloride (PF) 0.9 % injection            Current Outpatient Medications  Medication Sig Dispense Refill   aspirin EC 81 MG tablet Take 81 mg by mouth at bedtime.     atorvastatin (LIPITOR) 80 MG tablet TAKE 1 TABLET BY MOUTH AT  BEDTIME (Patient taking differently: Take 80 mg by mouth daily at 6 PM. ) 90 tablet 3   clopidogrel (PLAVIX) 75 MG tablet TAKE 1 TABLET BY MOUTH AT  BEDTIME (Patient taking differently: Take 75 mg by mouth daily. ) 90 tablet 3   fenofibrate 160 MG tablet Take 160 mg by mouth at bedtime.     metoprolol succinate (TOPROL-XL) 25 MG 24 hr tablet TAKE 1 TABLET BY MOUTH  DAILY (Patient taking differently: Take 25 mg by mouth daily. ) 90 tablet 3   nitroGLYCERIN (NITROSTAT) 0.4 MG SL tablet Place 1 tablet (0.4 mg total) under the tongue every 5 (five) minutes as needed for up to 25 days. For chest pain 25 tablet 3   ondansetron (ZOFRAN-ODT) 4 MG disintegrating tablet Take 4 mg by mouth every 8 (eight) hours as needed for nausea/vomiting.     pantoprazole (PROTONIX) 40 MG tablet Take 40 mg by mouth at bedtime.       Allergies  Allergen Reactions   Ciprofloxacin Rash   Daypro [Oxaprozin] Rash    ROS:   All other systems reviewed & are negative except per HPI or as noted below: Constitutional:  No fevers, chills, sweats.  Unintentional weight loss. Eyes:  No vision changes, No discharge HENT:  No sore throats, nasal drainage Lymph: No neck swelling, No bruising easily Pulmonary:  No cough, productive sputum CV: No orthopnea, PND  Patient walks without difficulty.  No exertional chest/neck/shoulder/arm pain. GI: Worsening nausea and vomiting as noted in HPI no personal nor family history of GI/colon cancer, inflammatory bowel disease, irritable bowel syndrome, allergy such as Celiac Sprue, dietary/dairy problems, colitis, ulcers nor gastritis.  No recent sick contacts/gastroenteritis.  No travel outside the country.  No changes in  diet. Renal: No UTIs, No hematuria Genital:  No drainage, bleeding, masses Musculoskeletal: No severe joint pain.  Good ROM major joints Skin:  No sores or lesions.  No rashes Heme/Lymph:  No easy bleeding.  No swollen lymph nodes Neuro: No focal weakness/numbness.  No seizures Psych: No suicidal ideation.  No hallucinations  BP 126/83    Pulse 88    Temp 98.4 F (36.9 C) (Oral)    Resp (!) 24    Ht 5\' 8"  (1.727 m)    Wt 78 kg    SpO2 95%    BMI 26.15 kg/m   Physical Exam: General: Pt awake/alert/oriented x4 in mild major acute distress Eyes: PERRL, normal EOM. Sclera nonicteric Neuro: CN II-XII intact w/o focal sensory/motor deficits. Lymph: No head/neck/groin lymphadenopathy Psych:  No delerium/psychosis/paranoia HENT: Normocephalic, Mucus membranes moist.  No thrush Neck: Supple, No tracheal deviation Chest: No pain.  Good respiratory excursion. CV:  Pulses intact.   Regular rhythm Abdomen: Soft, Nondistended.  Mild discomfort in lower abdomen but no peritonitis   No incarcerated hernias. Gen:  No inguinal hernias.  No inguinal lymphadenopathy.   Ext:  SCDs BLE.  No significant edema.  No cyanosis Skin: No petechiae / purpurea.  No major sores Musculoskeletal: No severe joint pain.  Good ROM major  joints   Results:   Labs: Results for orders placed or performed during the hospital encounter of 12/28/18 (from the past 48 hour(s))  Lipase, blood     Status: None   Collection Time: 12/28/18  4:01 PM  Result Value Ref Range   Lipase 25 11 - 51 U/L    Comment: Performed at [facility], [place] [address] [place], [place] [address]  Comprehensive metabolic panel     Status: Abnormal   Collection Time: 12/28/18  4:01 PM  Result Value Ref Range   Sodium 143 135 - 145 mmol/L   Potassium 3.4 (L) 3.5 - 5.1 mmol/L   Chloride 104 98 - 111 mmol/L   CO2 23 22 - 32 mmol/L   Glucose, Bld 134 (H) 70 - 99 mg/dL   BUN 11 8 - 23 mg/dL   Creatinine, Ser 0.80 0.61 - 1.24  mg/dL   Calcium 9.4 8.9 - 10.3 mg/dL   Total Protein 7.4 6.5 - 8.1 g/dL   Albumin 4.0 3.5 - 5.0 g/dL   AST 26 15 - 41 U/L   ALT 16 0 - 44 U/L   Alkaline Phosphatase 88 38 - 126 U/L   Total Bilirubin 1.0 0.3 - 1.2 mg/dL   GFR calc non Af Amer >60 >60 mL/min   GFR calc Af Amer >60 >60 mL/min   Anion gap 16 (H) 5 - 15    Comment: Performed at [facility], [place] [address], [place], [place] [address]  Urinalysis, Routine w reflex microscopic     Status: Abnormal   Collection Time: 12/28/18  4:01 PM  Result Value Ref Range   Color, Urine AMBER (A) YELLOW    Comment: BIOCHEMICALS MAY BE AFFECTED BY COLOR   APPearance HAZY (A) CLEAR   Specific Gravity, Urine 1.027 1.005 - 1.030   pH 5.0 5.0 - 8.0   Glucose, UA 50 (A) NEGATIVE mg/dL   Hgb urine dipstick NEGATIVE NEGATIVE   Bilirubin Urine NEGATIVE NEGATIVE   Ketones, ur 5 (A) NEGATIVE mg/dL   Protein, ur >=300 (A) NEGATIVE mg/dL   Nitrite NEGATIVE NEGATIVE   Leukocytes,Ua NEGATIVE NEGATIVE   RBC / HPF 0-5 0 - 5 RBC/hpf   WBC, UA 6-10 0 - 5 WBC/hpf   Bacteria, UA NONE SEEN NONE SEEN   Mucus PRESENT     Comment: Performed at [facility], [place] [address], [place], [place] [address]  CBC with Differential     Status: Abnormal   Collection Time: 12/28/18  4:01 PM  Result Value Ref Range   WBC 11.9 (H) 4.0 - 10.5 K/uL   RBC 5.08 4.22 - 5.81 MIL/uL   Hemoglobin 15.9 13.0 - 17.0 g/dL   HCT 47.9 39.0 - 52.0 %   MCV 94.3 80.0 - 100.0 fL   MCH 31.3 26.0 - 34.0 pg   MCHC 33.2 30.0 - 36.0 g/dL   RDW 12.9 11.5 - 15.5 %   Platelets 311 150 - 400 K/uL   nRBC 0.0 0.0 - 0.2 %   Neutrophils Relative % 76 %   Neutro Abs 9.1 (H) 1.7 - 7.7 K/uL   Lymphocytes Relative 16 %   Lymphs Abs 1.9 0.7 - 4.0 K/uL   Monocytes Relative 7 %   Monocytes Absolute 0.8 0.1 - 1.0 K/uL   Eosinophils Relative 1 %   Eosinophils Absolute 0.1 0.0 - 0.5 K/uL   Basophils Relative 0 %   Basophils Absolute 0.0 0.0 - 0.1 K/uL  Immature Granulocytes 0 %   Abs Immature Granulocytes 0.04 0.00 - 0.07 K/uL    Comment: Performed at [facility], [place] [address] [place], [place] [address]    Imaging / Studies: Ct Abdomen Pelvis W Contrast  Result Date: 12/28/2018 CLINICAL DATA:  Diverticulitis EXAM: CT ABDOMEN AND PELVIS WITH CONTRAST TECHNIQUE: Multidetector CT imaging of the abdomen and pelvis was performed using the standard protocol following bolus administration of intravenous contrast. CONTRAST:  117mL OMNIPAQUE IOHEXOL 300 MG/ML  SOLN COMPARISON:  November 03, 2018 FINDINGS: Lower chest: The visualized heart size within normal limits. No pericardial fluid/thickening. No hiatal hernia. The visualized portions of the lungs are clear. Hepatobiliary: The liver is normal in density without focal abnormality.The main portal vein is patent. No evidence of calcified gallstones, gallbladder wall thickening or biliary dilatation. Pancreas: Unremarkable. No pancreatic ductal dilatation or surrounding inflammatory changes. Spleen: Normal in size without focal abnormality. Adrenals/Urinary Tract: Both adrenal glands appear normal. Tiny low-density lesions are seen within both kidneys as on the prior exam. Stomach/Bowel: The stomach and small bowel are normal in appearance. Again noted is a focal segment of distal sigmoid colon diverticulitis with diffuse bowel wall thickening. Tiny foci of free air are again noted superior to area at with non loculated fluid. There likely increased appearance of the non loculated collection since the prior exam the appendix is unremarkable. Vascular/Lymphatic: There are no enlarged mesenteric, retroperitoneal, or pelvic lymph nodes. Scattered aortic atherosclerotic calcifications are seen without aneurysmal dilatation. Again noted is a aorto bi femoral stent graft. Reproductive: The prostate is heterogeneous in appearance. Other: No evidence of abdominal wall mass or hernia.  Musculoskeletal: No acute or significant osseous findings. Prior posterior lumbar spine fixation is seen. IMPRESSION: Again noted is distal sigmoid diverticulitis with adjacent micro perforation non loculated fluid which could represent phlegmon. The non-loculated collection appears slightly more prominent than the prior exam. Electronically Signed   By: Prudencio Pair M.D.   On: 12/28/2018 19:51    Medications / Allergies: per chart  Antibiotics: Anti-infectives (From admission, onward)   Start     Dose/Rate Route Frequency Ordered Stop   12/28/18 2200  meropenem (MERREM) 1 g in sodium chloride 0.9 % 100 mL IVPB     1 g 200 mL/hr over 30 Minutes Intravenous Every 8 hours 12/28/18 2042     12/28/18 2045  piperacillin-tazobactam (ZOSYN) IVPB 3.375 g  Status:  Discontinued     3.375 g 12.5 mL/hr over 240 Minutes Intravenous Every 8 hours 12/28/18 2040 12/28/18 2042   12/28/18 2045  anidulafungin (ERAXIS) 100 mg in sodium chloride 0.9 % 100 mL IVPB     100 mg 78 mL/hr over 100 Minutes Intravenous Every 24 hours 12/28/18 2042     12/28/18 2030  meropenem (MERREM) 1 g in sodium chloride 0.9 % 100 mL IVPB  Status:  Discontinued     1 g 200 mL/hr over 30 Minutes Intravenous  Once 12/28/18 2016 12/28/18 2040   12/28/18 2015  ertapenem (INVANZ) 1,000 mg in sodium chloride 0.9 % 100 mL IVPB  Status:  Discontinued     1 g 200 mL/hr over 30 Minutes Intravenous  Once 12/28/18 2001 12/28/18 2016        Note: Portions of this report may have been transcribed using voice recognition software. Every effort was made to ensure accuracy; however, inadvertent computerized transcription errors may be present.   Any transcriptional errors that result from this process are unintentional.  [name], MD, FACS, MASCRS Gastrointestinal and Minimally Invasive Surgery  [facility] [address] [place], [place] [address] [phone] Main / Paging [phone]  Fax     12/28/2018

## 2018-12-28 NOTE — ED Notes (Signed)
ED TO INPATIENT HANDOFF REPORT  ED Nurse Name and Phone #:  Leafy Kindle [phone]  S Name/Age/Gender Ray Brown 68 y.o. male Room/Bed: WA05/WA05  Code Status   Code Status: Full Code  Home/SNF/Other Home Patient oriented to: self, place, time and situation Is this baseline? Yes   Triage Complete: Triage complete  Chief Complaint diverticulitis; emesis  Triage Note Patient reports being in hospital 3 weeks ago for diverticulitis   Patient states he is suppose to have colon surgery on the 25th  Patient states he feels dehydrated.   C/o N/V C/O weakness and dizziness   Denies pain   Hx. diverticulitis    Allergies Allergies  Allergen Reactions  . Ciprofloxacin Rash  . Daypro [Oxaprozin] Rash    Level of Care/Admitting Diagnosis ED Disposition    ED Disposition Condition [facility]: [place] [id number]  Level of Care: Telemetry [5]  Admit to tele based on following criteria: Monitor for Ischemic changes  Covid Evaluation: Asymptomatic Screening Protocol (No Symptoms)  Diagnosis: Acute diverticulitis of intestine [id number]  Admitting Physician: Rise Patience [phone]  Attending Physician: Rise Patience [phone]  Estimated length of stay: past midnight tomorrow  Certification:: I certify this patient will need inpatient services for at least 2 midnights  PT Class (Do Not Modify): Inpatient [101]  PT Acc Code (Do Not Modify): Private [1]       B Medical/Surgery History Past Medical History:  Diagnosis Date  . Arthritis   . Bronchitis   . Chronic back pain   . Complication of anesthesia    difficulty breathing s/p back surgery at [facility] 2013  . Coronary artery disease   . Diabetes mellitus without complication ([place])   . Dry skin    in the back of head-left side  . GERD (gastroesophageal reflux disease)   . H/O hiatal hernia   . Hypercholesteremia   . Peripheral vascular disease ([place])   .  Pneumonia   . Shortness of breath   . Sleep apnea    has CPAP but does not use  . Unstable angina ([place]) 12/05/2014   Past Surgical History:  Procedure Laterality Date  . AORTA - BILATERAL FEMORAL ARTERY BYPASS GRAFT  2003   Dr Curt Jews  . BACK SURGERY  2004  . CARDIAC CATHETERIZATION  2010   x 2 stents RCA  . CARDIAC CATHETERIZATION N/A 12/08/2014   Procedure: Left Heart Cath and Coronary Angiography;  Surgeon: Lorretta Harp, MD;  Location: [facility];  Service: Cardiovascular;  Laterality: N/A;  . COLONOSCOPY  08/04/2009  . CORONARY ANGIOPLASTY     2 stents  . FOOT SURGERY  1980s   right foot  . SHOULDER ARTHROSCOPY WITH SUBACROMIAL DECOMPRESSION Right 03/28/2013   Procedure: RIGHT SHOULDER ARTHROSCOPY WITH SUBACROMIAL DECOMPRESSION AND DISTAL CLAVICLE RESECTION;  Surgeon: Marin Shutter, MD;  Location: [facility];  Service: Orthopedics;  Laterality: Right;  . SHOULDER SURGERY     left shoulder rotator cuff repair  . UPPER GI ENDOSCOPY       A IV Location/Drains/Wounds Patient Lines/Drains/Airways Status   Active Line/Drains/Airways    Name:   Placement date:   Placement time:   Site:   Days:   Peripheral IV 12/28/18 Right Antecubital   12/28/18    1606    Antecubital   less than 1   PICC Double Lumen [id number] PICC Right Basilic 38 cm 0 cm   [id number]  2154     53   Incision 11/18/11 Back   11/18/11    1100     2597   Incision 03/28/13 Shoulder Right   03/28/13    1107     2101          Intake/Output Last 24 hours  Intake/Output Summary (Last 24 hours) at 12/28/2018 2345 Last data filed at 12/28/2018 2300 Gross per 24 hour  Intake 3100 ml  Output -  Net 3100 ml    Labs/Imaging Results for orders placed or performed during the hospital encounter of 12/28/18 (from the past 48 hour(s))  Lipase, blood     Status: None   Collection Time: 12/28/18  4:01 PM  Result Value Ref Range   Lipase 25 11 - 51 U/L    Comment: Performed at [facility],  [place] [address], [place], [place] [address]  Comprehensive metabolic panel     Status: Abnormal   Collection Time: 12/28/18  4:01 PM  Result Value Ref Range   Sodium 143 135 - 145 mmol/L   Potassium 3.4 (L) 3.5 - 5.1 mmol/L   Chloride 104 98 - 111 mmol/L   CO2 23 22 - 32 mmol/L   Glucose, Bld 134 (H) 70 - 99 mg/dL   BUN 11 8 - 23 mg/dL   Creatinine, Ser 0.80 0.61 - 1.24 mg/dL   Calcium 9.4 8.9 - 10.3 mg/dL   Total Protein 7.4 6.5 - 8.1 g/dL   Albumin 4.0 3.5 - 5.0 g/dL   AST 26 15 - 41 U/L   ALT 16 0 - 44 U/L   Alkaline Phosphatase 88 38 - 126 U/L   Total Bilirubin 1.0 0.3 - 1.2 mg/dL   GFR calc non Af Amer >60 >60 mL/min   GFR calc Af Amer >60 >60 mL/min   Anion gap 16 (H) 5 - 15    Comment: Performed at [facility], [place] [address] [place], [place] [address]  Urinalysis, Routine w reflex microscopic     Status: Abnormal   Collection Time: 12/28/18  4:01 PM  Result Value Ref Range   Color, Urine AMBER (A) YELLOW    Comment: BIOCHEMICALS MAY BE AFFECTED BY COLOR   APPearance HAZY (A) CLEAR   Specific Gravity, Urine 1.027 1.005 - 1.030   pH 5.0 5.0 - 8.0   Glucose, UA 50 (A) NEGATIVE mg/dL   Hgb urine dipstick NEGATIVE NEGATIVE   Bilirubin Urine NEGATIVE NEGATIVE   Ketones, ur 5 (A) NEGATIVE mg/dL   Protein, ur >=300 (A) NEGATIVE mg/dL   Nitrite NEGATIVE NEGATIVE   Leukocytes,Ua NEGATIVE NEGATIVE   RBC / HPF 0-5 0 - 5 RBC/hpf   WBC, UA 6-10 0 - 5 WBC/hpf   Bacteria, UA NONE SEEN NONE SEEN   Mucus PRESENT     Comment: Performed at [facility], [place] [address], [place], [place] [address]  CBC with Differential     Status: Abnormal   Collection Time: 12/28/18  4:01 PM  Result Value Ref Range   WBC 11.9 (H) 4.0 - 10.5 K/uL   RBC 5.08 4.22 - 5.81 MIL/uL   Hemoglobin 15.9 13.0 - 17.0 g/dL   HCT 47.9 39.0 - 52.0 %   MCV 94.3 80.0 - 100.0 fL   MCH 31.3 26.0 - 34.0 pg   MCHC 33.2 30.0 - 36.0 g/dL   RDW 12.9 11.5 - 15.5 %   Platelets  311 150 - 400 K/uL   nRBC  0.0 0.0 - 0.2 %   Neutrophils Relative % 76 %   Neutro Abs 9.1 (H) 1.7 - 7.7 K/uL   Lymphocytes Relative 16 %   Lymphs Abs 1.9 0.7 - 4.0 K/uL   Monocytes Relative 7 %   Monocytes Absolute 0.8 0.1 - 1.0 K/uL   Eosinophils Relative 1 %   Eosinophils Absolute 0.1 0.0 - 0.5 K/uL   Basophils Relative 0 %   Basophils Absolute 0.0 0.0 - 0.1 K/uL   Immature Granulocytes 0 %   Abs Immature Granulocytes 0.04 0.00 - 0.07 K/uL    Comment: Performed at [facility], [place] [address] [place], [place] [address]   Ct Abdomen Pelvis W Contrast  Result Date: 12/28/2018 CLINICAL DATA:  Diverticulitis EXAM: CT ABDOMEN AND PELVIS WITH CONTRAST TECHNIQUE: Multidetector CT imaging of the abdomen and pelvis was performed using the standard protocol following bolus administration of intravenous contrast. CONTRAST:  177mL OMNIPAQUE IOHEXOL 300 MG/ML  SOLN COMPARISON:  November 03, 2018 FINDINGS: Lower chest: The visualized heart size within normal limits. No pericardial fluid/thickening. No hiatal hernia. The visualized portions of the lungs are clear. Hepatobiliary: The liver is normal in density without focal abnormality.The main portal vein is patent. No evidence of calcified gallstones, gallbladder wall thickening or biliary dilatation. Pancreas: Unremarkable. No pancreatic ductal dilatation or surrounding inflammatory changes. Spleen: Normal in size without focal abnormality. Adrenals/Urinary Tract: Both adrenal glands appear normal. Tiny low-density lesions are seen within both kidneys as on the prior exam. Stomach/Bowel: The stomach and small bowel are normal in appearance. Again noted is a focal segment of distal sigmoid colon diverticulitis with diffuse bowel wall thickening. Tiny foci of free air are again noted superior to area at with non loculated fluid. There likely increased appearance of the non loculated collection since the prior exam the appendix is  unremarkable. Vascular/Lymphatic: There are no enlarged mesenteric, retroperitoneal, or pelvic lymph nodes. Scattered aortic atherosclerotic calcifications are seen without aneurysmal dilatation. Again noted is a aorto bi femoral stent graft. Reproductive: The prostate is heterogeneous in appearance. Other: No evidence of abdominal wall mass or hernia. Musculoskeletal: No acute or significant osseous findings. Prior posterior lumbar spine fixation is seen. IMPRESSION: Again noted is distal sigmoid diverticulitis with adjacent micro perforation non loculated fluid which could represent phlegmon. The non-loculated collection appears slightly more prominent than the prior exam. Electronically Signed   By: Prudencio Pair M.D.   On: 12/28/2018 19:51    Pending Labs Unresulted Labs (From admission, onward)    Start     Ordered   01/05/19 0000  Prealbumin QWeek  Weekly,   R     12/28/18 2054   12/29/18 [id number]  Basic metabolic panel  Tomorrow morning,   R     12/28/18 2321   12/29/18 0500  CBC  Tomorrow morning,   R     12/28/18 2321   12/28/18 2341  Hemoglobin A1c  Once,   STAT     12/28/18 2340   12/28/18 2055  Magnesium - add on  Add-on,   AD    Comments: If Mag Level is >2, it is adequateIf Mag level is 1.5-2, give 2g IV Magnesium sulfate x 1If Mag level < 1.4, give 4g  IV Magnesium sulfate x 1    12/28/18 2054   12/28/18 2055  Prealbumin - add on  Add-on,   AD     12/28/18 2054   12/28/18 2013  SARS CORONAVIRUS 2 (TAT 6-24 HRS) Nasopharyngeal  Nasopharyngeal Swab  (Asymptomatic/Tier 2 Patients Labs)  Once,   STAT    Question Answer Comment  Is this test for diagnosis or screening Screening   Symptomatic for COVID-19 as defined by CDC No   Hospitalized for COVID-19 No   Admitted to ICU for COVID-19 No   Previously tested for COVID-19 Yes   Resident in a congregate (group) care setting No   Employed in healthcare setting No      12/28/18 2012          Vitals/Pain Today's Vitals   12/28/18  2200 12/28/18 2221 12/28/18 2245 12/28/18 2330  BP: (!) 152/91  (!) 142/72 131/73  Pulse: 78  85 100  Resp: 20  (!) 23 (!) 25  Temp:      TempSrc:      SpO2: 94%  95% 91%  Weight:      Height:      PainSc:  0-No pain      Isolation Precautions No active isolations  Medications Medications  sodium chloride (PF) 0.9 % injection (has no administration in time range)  lactated ringers bolus 1,000 mL (has no administration in time range)  acetaminophen (TYLENOL) suppository 650 mg (has no administration in time range)  methocarbamol (ROBAXIN) 1,000 mg in dextrose 5 % 100 mL IVPB (has no administration in time range)  ondansetron (ZOFRAN) injection 4 mg (4 mg Intravenous Given 12/28/18 2213)    Or  ondansetron (ZOFRAN) 8 mg in sodium chloride 0.9 % 50 mL IVPB ( Intravenous See Alternative 12/28/18 2213)  prochlorperazine (COMPAZINE) injection 5-10 mg (has no administration in time range)  lip balm (CARMEX) ointment 1 application (has no administration in time range)  magic mouthwash (has no administration in time range)  bisacodyl (DULCOLAX) suppository 10 mg (has no administration in time range)  guaiFENesin-dextromethorphan (ROBITUSSIN DM) 100-10 MG/5ML syrup 10 mL (has no administration in time range)  hydrocortisone (ANUSOL-HC) 2.5 % rectal cream 1 application (has no administration in time range)  alum & mag hydroxide-simeth (MAALOX/MYLANTA) 200-200-20 MG/5ML suspension 30 mL (has no administration in time range)  hydrocortisone cream 1 % 1 application (has no administration in time range)  menthol-cetylpyridinium (CEPACOL) lozenge 3 mg (has no administration in time range)  phenol (CHLORASEPTIC) mouth spray 1-2 spray (has no administration in time range)  meropenem (MERREM) 1 g in sodium chloride 0.9 % 100 mL IVPB (has no administration in time range)  potassium chloride 10 mEq in 100 mL IVPB (0 mEq Intravenous Stopped 12/28/18 2300)  anidulafungin (ERAXIS) 200 mg in sodium chloride  0.9 % 200 mL IVPB (200 mg Intravenous New Bag/Given 12/28/18 2309)    Followed by  anidulafungin (ERAXIS) 100 mg in sodium chloride 0.9 % 100 mL IVPB (has no administration in time range)  acetaminophen (TYLENOL) tablet 650 mg (has no administration in time range)    Or  acetaminophen (TYLENOL) suppository 650 mg (has no administration in time range)  ondansetron (ZOFRAN) tablet 4 mg (has no administration in time range)    Or  ondansetron (ZOFRAN) injection 4 mg (has no administration in time range)  pantoprazole (PROTONIX) injection 40 mg (has no administration in time range)  acetaminophen (TYLENOL) tablet 1,000 mg (has no administration in time range)  alvimopan (ENTEREG) capsule 12 mg (has no administration in time range)  bisacodyl (DULCOLAX) EC tablet 20 mg (has no administration in time range)  bupivacaine liposome (EXPAREL) 1.3 % injection 266 mg (has no administration in time range)  cefoTEtan (CEFOTAN) 2 g  in sodium chloride 0.9 % 100 mL IVPB (has no administration in time range)  clindamycin (CLEOCIN) 900 mg, gentamicin (GARAMYCIN) 240 mg in sodium chloride 0.9 % 1,000 mL for intraperitoneal lavage (has no administration in time range)  gabapentin (NEURONTIN) capsule 300 mg (has no administration in time range)  neomycin (MYCIFRADIN) tablet 1,000 mg (has no administration in time range)    And  metroNIDAZOLE (FLAGYL) tablet 1,000 mg (has no administration in time range)  polyethylene glycol powder (GLYCOLAX/MIRALAX) container 255 g (has no administration in time range)  enoxaparin (LOVENOX) injection 40 mg (has no administration in time range)  sodium chloride flush (NS) 0.9 % injection 3 mL (3 mLs Intravenous Given 12/28/18 1607)  sodium chloride 0.9 % bolus 1,000 mL (0 mLs Intravenous Stopped 12/28/18 2034)  ondansetron (ZOFRAN) injection 4 mg (4 mg Intravenous Given 12/28/18 1654)  ondansetron (ZOFRAN) injection 4 mg (4 mg Intravenous Given 12/28/18 1853)  iohexol (OMNIPAQUE) 300  MG/ML solution 100 mL (100 mLs Intravenous Contrast Given 12/28/18 1923)  sodium chloride 0.9 % bolus 1,000 mL (0 mLs Intravenous Stopped 12/28/18 2205)  lactated ringers bolus 1,000 mL (0 mLs Intravenous Stopped 12/28/18 2300)    Mobility walks Low fall risk     R Recommendations: See Admitting Provider Note  Report given to:  Meredith Pel

## 2018-12-28 NOTE — H&P (Signed)
History and Physical    Ray Brown [id number] DOB: 1950-06-28 DOA: 12/28/2018  PCP: [name], MD  Patient coming from: Home.  Chief Complaint: Nausea vomiting.  HPI: Ray Brown is a 68 y.o. male with history of CAD, diabetes mellitus type 2, peripheral vascular disease, sleep apnea not using CPAP who has been having recurrent sigmoid diverticulitis and was planned to have scheduled surgery later this month and was admitted previously last month had prolonged IV antibiotics presents to the ER because of intractable nausea vomiting over the last 24 hours.  Patient states he has been having abdominal discomfort and vomiting off and on but over the last 24 hours it became more pronounced.  Denies any blood in the vomitus.  Denies any abdominal pain.  ED Course: In the ER CT abdomen pelvis shows redemonstrates sigmoid diverticulitis with microperforation and phlegmon and increasing fluid collection.  On-call general surgeon has been consulted patient started on empiric antibiotics IV fluids and admitted.  Labs reveal potassium 3.4 WBC 11.9 platelets 311 LFTs are normal.  COVID-19 test is pending.  Review of Systems: As per HPI, rest all negative.   Past Medical History:  Diagnosis Date  . Arthritis   . Bronchitis   . Chronic back pain   . Complication of anesthesia    difficulty breathing s/p back surgery at [facility] 2013  . Coronary artery disease   . Diabetes mellitus without complication ([place])   . Dry skin    in the back of head-left side  . GERD (gastroesophageal reflux disease)   . H/O hiatal hernia   . Hypercholesteremia   . Peripheral vascular disease ([place])   . Pneumonia   . Shortness of breath   . Sleep apnea    has CPAP but does not use  . Unstable angina ([place]) 12/05/2014    Past Surgical History:  Procedure Laterality Date  . AORTA - BILATERAL FEMORAL ARTERY BYPASS GRAFT  2003   Dr Curt Jews  . BACK SURGERY  2004  . CARDIAC CATHETERIZATION  2010   x  2 stents RCA  . CARDIAC CATHETERIZATION N/A 12/08/2014   Procedure: Left Heart Cath and Coronary Angiography;  Surgeon: Lorretta Harp, MD;  Location: [facility];  Service: Cardiovascular;  Laterality: N/A;  . COLONOSCOPY  08/04/2009  . CORONARY ANGIOPLASTY     2 stents  . FOOT SURGERY  1980s   right foot  . SHOULDER ARTHROSCOPY WITH SUBACROMIAL DECOMPRESSION Right 03/28/2013   Procedure: RIGHT SHOULDER ARTHROSCOPY WITH SUBACROMIAL DECOMPRESSION AND DISTAL CLAVICLE RESECTION;  Surgeon: Marin Shutter, MD;  Location: [facility];  Service: Orthopedics;  Laterality: Right;  . SHOULDER SURGERY     left shoulder rotator cuff repair  . UPPER GI ENDOSCOPY       reports that he has quit smoking. His smoking use included cigarettes. He quit after 35.00 years of use. He has never used smokeless tobacco. He reports that he does not drink alcohol or use drugs.  Allergies  Allergen Reactions  . Ciprofloxacin Rash  . Daypro [Oxaprozin] Rash    Family History  Problem Relation Age of Onset  . Colon cancer Mother   . Heart attack Father        , massive  . Coronary artery disease Brother   . Thyroid cancer Brother     Prior to Admission medications   Medication Sig Start Date End Date Taking? Authorizing Provider  aspirin EC 81 MG tablet Take 81 mg  by mouth at bedtime.   Yes [provider]  atorvastatin (LIPITOR) 80 MG tablet TAKE 1 TABLET BY MOUTH AT  BEDTIME Patient taking differently: Take 80 mg by mouth daily at 6 PM.  12/13/18  Yes Skains, Thana Farr, MD  clopidogrel (PLAVIX) 75 MG tablet TAKE 1 TABLET BY MOUTH AT  BEDTIME Patient taking differently: Take 75 mg by mouth daily.  12/13/18  Yes [name], MD  fenofibrate 160 MG tablet Take 160 mg by mouth at bedtime.   Yes [provider]  metoprolol succinate (TOPROL-XL) 25 MG 24 hr tablet TAKE 1 TABLET BY MOUTH  DAILY Patient taking differently: Take 25 mg by mouth daily.  12/13/18  Yes [name], MD   nitroGLYCERIN (NITROSTAT) 0.4 MG SL tablet Place 1 tablet (0.4 mg total) under the tongue every 5 (five) minutes as needed for up to 25 days. For chest pain 11/12/18 12/28/18 Yes [name], MD  ondansetron (ZOFRAN-ODT) 4 MG disintegrating tablet Take 4 mg by mouth every 8 (eight) hours as needed for nausea/vomiting. 12/23/18  Yes [provider]  pantoprazole (PROTONIX) 40 MG tablet Take 40 mg by mouth at bedtime.   Yes [provider]    Physical Exam: Constitutional: Moderately built and nourished. Vitals:   12/28/18 2030 12/28/18 2115 12/28/18 2200 12/28/18 2245  BP: 126/83 129/72 (!) 152/91 (!) 142/72  Pulse: 88 87 78 85  Resp: (!) 24 (!) 24 20 (!) 23  Temp:      TempSrc:      SpO2: 95% 97% 94% 95%  Weight:      Height:       Eyes: Anicteric no pallor. ENMT: No discharge from the ears eyes nose or mouth. Neck: No mass felt.  No neck rigidity. Respiratory: No rhonchi or crepitations. Cardiovascular: S1-S2 heard. Abdomen: Soft nontender bowel sounds present. Musculoskeletal: No edema. Skin: No rash. Neurologic: Alert awake oriented to time place and person.  Moves all extremities. Psychiatric: Appears normal per normal affect.   Labs on Admission: I have personally reviewed following labs and imaging studies  CBC: Recent Labs  Lab 12/28/18 1601  WBC 11.9*  NEUTROABS 9.1*  HGB 15.9  HCT 47.9  MCV 94.3  PLT [id number]   Basic Metabolic Panel: Recent Labs  Lab 12/28/18 1601  NA 143  K 3.4*  CL 104  CO2 23  GLUCOSE 134*  BUN 11  CREATININE 0.80  CALCIUM 9.4   GFR: Estimated Creatinine Clearance: 84.3 mL/min (by C-G formula based on SCr of 0.8 mg/dL). Liver Function Tests: Recent Labs  Lab 12/28/18 1601  AST 26  ALT 16  ALKPHOS 88  BILITOT 1.0  PROT 7.4  ALBUMIN 4.0   Recent Labs  Lab 12/28/18 1601  LIPASE 25   No results for input(s): AMMONIA in the last 168 hours. Coagulation Profile: No results for input(s): INR, PROTIME in the  last 168 hours. Cardiac Enzymes: No results for input(s): CKTOTAL, CKMB, CKMBINDEX, TROPONINI in the last 168 hours. BNP (last 3 results) No results for input(s): PROBNP in the last 8760 hours. HbA1C: No results for input(s): HGBA1C in the last 72 hours. CBG: No results for input(s): GLUCAP in the last 168 hours. Lipid Profile: No results for input(s): CHOL, HDL, LDLCALC, TRIG, CHOLHDL, LDLDIRECT in the last 72 hours. Thyroid Function Tests: No results for input(s): TSH, T4TOTAL, FREET4, T3FREE, THYROIDAB in the last 72 hours. Anemia Panel: No results for input(s): VITAMINB12, FOLATE, FERRITIN, TIBC, IRON, RETICCTPCT in  the last 72 hours. Urine analysis:    Component Value Date/Time   COLORURINE AMBER (A) 12/28/2018 1601   APPEARANCEUR HAZY (A) 12/28/2018 1601   LABSPEC 1.027 12/28/2018 1601   PHURINE 5.0 12/28/2018 1601   GLUCOSEU 50 (A) 12/28/2018 1601   HGBUR NEGATIVE 12/28/2018 1601   BILIRUBINUR NEGATIVE 12/28/2018 1601   KETONESUR 5 (A) 12/28/2018 1601   PROTEINUR >=300 (A) 12/28/2018 1601   UROBILINOGEN 0.2 01/22/2010 1943   NITRITE NEGATIVE 12/28/2018 1601   LEUKOCYTESUR NEGATIVE 12/28/2018 1601   Sepsis Labs: @LABRCNTIP (procalcitonin:4,lacticidven:4) )No results found for this or any previous visit (from the past 240 hour(s)).   Radiological Exams on Admission: Ct Abdomen Pelvis W Contrast  Result Date: 12/28/2018 CLINICAL DATA:  Diverticulitis EXAM: CT ABDOMEN AND PELVIS WITH CONTRAST TECHNIQUE: Multidetector CT imaging of the abdomen and pelvis was performed using the standard protocol following bolus administration of intravenous contrast. CONTRAST:  121mL OMNIPAQUE IOHEXOL 300 MG/ML  SOLN COMPARISON:  November 03, 2018 FINDINGS: Lower chest: The visualized heart size within normal limits. No pericardial fluid/thickening. No hiatal hernia. The visualized portions of the lungs are clear. Hepatobiliary: The liver is normal in density without focal abnormality.The main  portal vein is patent. No evidence of calcified gallstones, gallbladder wall thickening or biliary dilatation. Pancreas: Unremarkable. No pancreatic ductal dilatation or surrounding inflammatory changes. Spleen: Normal in size without focal abnormality. Adrenals/Urinary Tract: Both adrenal glands appear normal. Tiny low-density lesions are seen within both kidneys as on the prior exam. Stomach/Bowel: The stomach and small bowel are normal in appearance. Again noted is a focal segment of distal sigmoid colon diverticulitis with diffuse bowel wall thickening. Tiny foci of free air are again noted superior to area at with non loculated fluid. There likely increased appearance of the non loculated collection since the prior exam the appendix is unremarkable. Vascular/Lymphatic: There are no enlarged mesenteric, retroperitoneal, or pelvic lymph nodes. Scattered aortic atherosclerotic calcifications are seen without aneurysmal dilatation. Again noted is a aorto bi femoral stent graft. Reproductive: The prostate is heterogeneous in appearance. Other: No evidence of abdominal wall mass or hernia. Musculoskeletal: No acute or significant osseous findings. Prior posterior lumbar spine fixation is seen. IMPRESSION: Again noted is distal sigmoid diverticulitis with adjacent micro perforation non loculated fluid which could represent phlegmon. The non-loculated collection appears slightly more prominent than the prior exam. Electronically Signed   By: Prudencio Pair M.D.   On: 12/28/2018 19:51      Assessment/Plan Principal Problem:   Diverticulitis of colon with perforation Active Problems:   Peripheral vascular disease ([place])   Obesity   Diabetes mellitus with peripheral artery disease (HCC)   Coronary artery disease involving native coronary artery of native heart without angina pectoris   Dyslipidemia   OSA (obstructive sleep apnea)   BPPV (benign paroxysmal positional vertigo)   Chronic anticoagulation    Coronary stent 2008  restenosis due to progression of disease s/p restent 2010   Status post aortobifemoral bypass surgery 2013   Left-sided low back pain with left-sided sciatica   Nausea & vomiting   Hypokalemia   Acute diverticulitis of intestine    1. Recurrent sigmoid diverticulitis with microperforation and phlegmon General surgery has been consulted patient on empiric antibiotics IV fluids.  Plan is to likely having surgery this admission. 2. Diabetes mellitus type 2 we will keep patient with sliding scale coverage. 3. History of CAD denies any chest pain.  Antiplatelet agents on hold due to likely surgery. 4. Hypertension on Toprol-XL  which may be contrary if patient can take orally.  Otherwise may have to give IV as needed.  COVID-19 test is pending.  Since patient has sigmoid diverticulitis with microperforation and will need close observation and more than 2 midnight stay will need inpatient status.   DVT prophylaxis: SCDs. Code Status: Full code. Family Communication: Discussed with patient. Disposition Plan: Home. Consults called: General surgery. Admission status: Inpatient.   Rise Patience MD [facility] Pager [phone].  If 7PM-7AM, please contact night-coverage [email or web address] Password [email or web address]  12/28/2018, 11:22 PM

## 2018-12-28 NOTE — ED Triage Notes (Signed)
Patient reports being in hospital 3 weeks ago for diverticulitis   Patient states he is suppose to have colon surgery on the 25th  Patient states he feels dehydrated.   C/o N/V C/O weakness and dizziness   Denies pain   Hx. diverticulitis

## 2018-12-29 DIAGNOSIS — R112 Nausea with vomiting, unspecified: Secondary | ICD-10-CM

## 2018-12-29 DIAGNOSIS — E440 Moderate protein-calorie malnutrition: Secondary | ICD-10-CM

## 2018-12-29 LAB — CBC
HCT: 41.8 % (ref 39.0–52.0)
Hemoglobin: 13.7 g/dL (ref 13.0–17.0)
MCH: 31.5 pg (ref 26.0–34.0)
MCHC: 32.8 g/dL (ref 30.0–36.0)
MCV: 96.1 fL (ref 80.0–100.0)
Platelets: 217 10*3/uL (ref 150–400)
RBC: 4.35 MIL/uL (ref 4.22–5.81)
RDW: 13 % (ref 11.5–15.5)
WBC: 11.7 10*3/uL — ABNORMAL HIGH (ref 4.0–10.5)
nRBC: 0 % (ref 0.0–0.2)

## 2018-12-29 LAB — COMPREHENSIVE METABOLIC PANEL
ALT: 13 U/L (ref 0–44)
AST: 17 U/L (ref 15–41)
Albumin: 3 g/dL — ABNORMAL LOW (ref 3.5–5.0)
Alkaline Phosphatase: 63 U/L (ref 38–126)
Anion gap: 13 (ref 5–15)
BUN: 11 mg/dL (ref 8–23)
CO2: 23 mmol/L (ref 22–32)
Calcium: 8 mg/dL — ABNORMAL LOW (ref 8.9–10.3)
Chloride: 107 mmol/L (ref 98–111)
Creatinine, Ser: 0.67 mg/dL (ref 0.61–1.24)
GFR calc Af Amer: >60 mL/min (ref >60)
GFR calc non Af Amer: >60 mL/min (ref >60)
Glucose, Bld: 103 mg/dL — ABNORMAL HIGH (ref 70–99)
Potassium: 3.4 mmol/L — ABNORMAL LOW (ref 3.5–5.1)
Sodium: 143 mmol/L (ref 135–145)
Total Bilirubin: 0.8 mg/dL (ref 0.3–1.2)
Total Protein: 5.4 g/dL — ABNORMAL LOW (ref 6.5–8.1)

## 2018-12-29 LAB — BASIC METABOLIC PANEL
Anion gap: 13 (ref 5–15)
BUN: 11 mg/dL (ref 8–23)
CO2: 23 mmol/L (ref 22–32)
Calcium: 8.1 mg/dL — ABNORMAL LOW (ref 8.9–10.3)
Chloride: 105 mmol/L (ref 98–111)
Creatinine, Ser: 0.76 mg/dL (ref 0.61–1.24)
GFR calc Af Amer: >60 mL/min (ref >60)
GFR calc non Af Amer: >60 mL/min (ref >60)
Glucose, Bld: 128 mg/dL — ABNORMAL HIGH (ref 70–99)
Potassium: 3.2 mmol/L — ABNORMAL LOW (ref 3.5–5.1)
Sodium: 141 mmol/L (ref 135–145)

## 2018-12-29 LAB — GLUCOSE, CAPILLARY
Glucose-Capillary: 108 mg/dL — ABNORMAL HIGH (ref 70–99)
Glucose-Capillary: 131 mg/dL — ABNORMAL HIGH (ref 70–99)
Glucose-Capillary: 82 mg/dL (ref 70–99)
Glucose-Capillary: 83 mg/dL (ref 70–99)
Glucose-Capillary: 88 mg/dL (ref 70–99)

## 2018-12-29 LAB — CBC WITH DIFFERENTIAL/PLATELET
Abs Immature Granulocytes: 0.03 10*3/uL (ref 0.00–0.07)
Basophils Absolute: 0 10*3/uL (ref 0.0–0.1)
Basophils Relative: 0 %
Eosinophils Absolute: 0 10*3/uL (ref 0.0–0.5)
Eosinophils Relative: 0 %
HCT: 39.7 % (ref 39.0–52.0)
Hemoglobin: 13 g/dL (ref 13.0–17.0)
Immature Granulocytes: 0 %
Lymphocytes Relative: 15 %
Lymphs Abs: 1.5 10*3/uL (ref 0.7–4.0)
MCH: 31.2 pg (ref 26.0–34.0)
MCHC: 32.7 g/dL (ref 30.0–36.0)
MCV: 95.2 fL (ref 80.0–100.0)
Monocytes Absolute: 0.6 10*3/uL (ref 0.1–1.0)
Monocytes Relative: 6 %
Neutro Abs: 7.7 10*3/uL (ref 1.7–7.7)
Neutrophils Relative %: 79 %
Platelets: 218 10*3/uL (ref 150–400)
RBC: 4.17 MIL/uL — ABNORMAL LOW (ref 4.22–5.81)
RDW: 12.9 % (ref 11.5–15.5)
WBC: 9.9 10*3/uL (ref 4.0–10.5)
nRBC: 0 % (ref 0.0–0.2)

## 2018-12-29 LAB — SARS CORONAVIRUS 2 (TAT 6-24 HRS): SARS Coronavirus 2: NEGATIVE

## 2018-12-29 LAB — HEMOGLOBIN A1C
Hgb A1c MFr Bld: 6.4 % — ABNORMAL HIGH (ref 4.8–5.6)
Mean Plasma Glucose: 136.98 mg/dL

## 2018-12-29 LAB — MAGNESIUM: Magnesium: 0.4 mg/dL — CL (ref 1.7–2.4)

## 2018-12-29 LAB — PREALBUMIN: Prealbumin: 12 mg/dL — ABNORMAL LOW (ref 18–38)

## 2018-12-29 MED ORDER — PSYLLIUM 95 % PO PACK
1.0000 | PACK | Freq: Every day | ORAL | Status: DC
  Filled 2018-12-29 (×2): qty 1

## 2018-12-29 MED ORDER — SACCHAROMYCES BOULARDII 250 MG PO CAPS
250.0000 mg | ORAL_CAPSULE | Freq: Two times a day (BID) | ORAL | Status: DC
  Administered 2018-12-29 – 2019-01-01 (×7): 250 mg via ORAL
  Filled 2018-12-29 (×7): qty 1

## 2018-12-29 MED ORDER — MAGNESIUM OXIDE 400 (241.3 MG) MG PO TABS
400.0000 mg | ORAL_TABLET | Freq: Two times a day (BID) | ORAL | Status: DC
  Administered 2018-12-29: 06:00:00 400 mg via ORAL
  Filled 2018-12-29: qty 1

## 2018-12-29 MED ORDER — MENTHOL 3 MG MT LOZG: 1.0000 | LOZENGE | OROMUCOSAL | Status: DC | PRN

## 2018-12-29 MED ORDER — MAGNESIUM SULFATE 4 GM/100ML IV SOLN
4.0000 g | Freq: Once | INTRAVENOUS | Status: AC
  Administered 2018-12-29: 10:00:00 4 g via INTRAVENOUS
  Filled 2018-12-29: qty 100

## 2018-12-29 MED ORDER — POTASSIUM CHLORIDE 10 MEQ/100ML IV SOLN
10.0000 meq | INTRAVENOUS | Status: AC
  Administered 2018-12-29 (×5): 10 meq via INTRAVENOUS
  Filled 2018-12-29 (×6): qty 100

## 2018-12-29 MED ORDER — METOCLOPRAMIDE HCL 5 MG/ML IJ SOLN: 5.0000 mg | Freq: Three times a day (TID) | INTRAMUSCULAR | Status: DC | PRN

## 2018-12-29 MED ORDER — ALUM & MAG HYDROXIDE-SIMETH 200-200-20 MG/5ML PO SUSP: 30.0000 mL | Freq: Four times a day (QID) | ORAL | Status: DC | PRN

## 2018-12-29 MED ORDER — BISACODYL 10 MG RE SUPP
10.0000 mg | Freq: Every day | RECTAL | Status: DC
  Administered 2018-12-29 – 2018-12-30 (×2): 10 mg via RECTAL
  Filled 2018-12-29 (×3): qty 1

## 2018-12-29 MED ORDER — LIP MEDEX EX OINT: 1.0000 "application " | TOPICAL_OINTMENT | Freq: Two times a day (BID) | CUTANEOUS | Status: DC

## 2018-12-29 MED ORDER — INSULIN ASPART 100 UNIT/ML ~~LOC~~ SOLN
0.0000 [IU] | SUBCUTANEOUS | Status: DC
  Administered 2018-12-29 – 2018-12-30 (×2): 1 [IU] via SUBCUTANEOUS

## 2018-12-29 MED ORDER — HYDROCORTISONE (PERIANAL) 2.5 % EX CREA: 1.0000 "application " | TOPICAL_CREAM | Freq: Four times a day (QID) | CUTANEOUS | Status: DC | PRN

## 2018-12-29 MED ORDER — MAGNESIUM SULFATE 2 GM/50ML IV SOLN
2.0000 g | INTRAVENOUS | Status: DC
  Administered 2018-12-29 (×2): 2 g via INTRAVENOUS
  Filled 2018-12-29 (×2): qty 50

## 2018-12-29 MED ORDER — LACTATED RINGERS IV SOLN
INTRAVENOUS | Status: AC
  Administered 2018-12-29 (×2): via INTRAVENOUS

## 2018-12-29 MED ORDER — PHENOL 1.4 % MT LIQD: 1.0000 | OROMUCOSAL | Status: DC | PRN

## 2018-12-29 MED ORDER — LACTATED RINGERS IV BOLUS: 1000.0000 mL | Freq: Three times a day (TID) | INTRAVENOUS | Status: AC | PRN

## 2018-12-29 MED ORDER — GUAIFENESIN-DM 100-10 MG/5ML PO SYRP: 10.0000 mL | ORAL_SOLUTION | ORAL | Status: DC | PRN

## 2018-12-29 MED ORDER — HYDROCORTISONE 1 % EX CREA: 1.0000 "application " | TOPICAL_CREAM | Freq: Three times a day (TID) | CUTANEOUS | Status: DC | PRN

## 2018-12-29 MED ORDER — MAGIC MOUTHWASH
15.0000 mL | Freq: Four times a day (QID) | ORAL | Status: DC | PRN
  Filled 2018-12-29 (×3): qty 15

## 2018-12-29 NOTE — Progress Notes (Signed)
PROGRESS NOTE    Ray Brown  [id number] DOB: [birth year] DOA: 12/28/2018 PCP: [name], MD   Brief Narrative:   68 year old male with prior h/o CAD, type 2 DM, PVD, sleep apnea not on CPAP, started having bouts of diverticulitis from June 2020, another episode in September, presents with persistent nausea, vomiting , abdominal pain and diarrhea. On arrival ED CT abd and pelvis show sigmoid diverticulitis with microperforation and phlegmon and increasing fluid collection. Surgery consulted, recommended to continue with IV antibiotics, IV fluids, and a repeat CT abd and pelvis in 5 days to watch for improvement , if no improvement, plan for surgical resection.   Assessment & Plan:   Principal Problem:   Diverticulitis of colon with perforation Active Problems:   Peripheral vascular disease ([place])   Obesity   Diabetes mellitus with peripheral artery disease (HCC)   Coronary artery disease involving native coronary artery of native heart without angina pectoris   Dyslipidemia   OSA (obstructive sleep apnea)   BPPV (benign paroxysmal positional vertigo)   Chronic anticoagulation   Coronary stent 2008  restenosis due to progression of disease s/p restent 2010   Status post aortobifemoral bypass surgery 2013   Left-sided low back pain with left-sided sciatica   Nausea & vomiting   Hypokalemia   Acute diverticulitis of intestine   Hypomagnesemia   Protein-calorie malnutrition, moderate (HCC)   Sigmoid Diverticulitis : His nausea, vomiting is better , no abd pain today. Continue with IV fluids and IV antibiotics.  Appreciate surgery recommendations.  Symptomatic management with pain control and anti emetics.    Hypokalemia: replaced.   Hypomagnesemia replaced.   Mod protein calorie malnutrition;  Nutritionist on board.    CAD s/p PCI:  Hold plavix for possible procedure later on.  Pt denies any chest pain or sob.    Diabetes Mellitus:  CBG (last 3)  Recent  Labs    12/29/18 0728 12/29/18 1131 12/29/18 1638  GLUCAP 83 131* 82   Resume SSI.  A1c is 6.4.   Dyslipidemia   OSA: not on CPAP.   DVT prophylaxis: scd's Code Status: full code.  Family Communication: family at bedside.  Disposition Plan: pending improvement of diverticulitis.   Consultants:   Surgery   Procedures: none.   Antimicrobials: IV meropenem and IV Eraxis.    Subjective: No chest pain or sob, nausea has improved. abd pain has improved.   Objective: Vitals:   12/29/18 0026 12/29/18 0425 12/29/18 1015 12/29/18 1438  BP: 127/76 130/83 119/68 126/88  Pulse: 89 86 75 89  Resp: (!) 21 20 18 20   Temp: 99.2 F (37.3 C) 98.3 F (36.8 C) 98 F (36.7 C) 98.1 F (36.7 C)  TempSrc: Oral Oral Oral Oral  SpO2: 95% 95% 97% 98%  Weight:      Height:        Intake/Output Summary (Last 24 hours) at 12/29/2018 1655 Last data filed at 12/29/2018 1500 Gross per 24 hour  Intake 4225.08 ml  Output 1000 ml  Net 3225.08 ml   Filed Weights   12/28/18 1515  Weight: 78 kg    Examination:  General exam: Appears calm and comfortable  Respiratory system: Clear to auscultation. Respiratory effort normal. Cardiovascular system: S1 & S2 heard, RRR. No JVD,  Gastrointestinal system: Abdomen is nondistended, soft and nontender. No organomegaly or masses felt. Normal bowel sounds heard. Central nervous system: Alert and oriented. No focal neurological deficits. Extremities: Symmetric 5 x 5 power. Skin: No  rashes, lesions or ulcers Psychiatry:  Mood & affect appropriate.     Data Reviewed: I have personally reviewed following labs and imaging studies  CBC: Recent Labs  Lab 12/28/18 1601 12/29/18 0059 12/29/18 0531  WBC 11.9* 11.7* 9.9  NEUTROABS 9.1*  --  7.7  HGB 15.9 13.7 13.0  HCT 47.9 41.8 39.7  MCV 94.3 96.1 95.2  PLT 311 217 [id number]   Basic Metabolic Panel: Recent Labs  Lab 12/28/18 1601 12/29/18 0059 12/29/18 0531  NA 143 141 143  K 3.4* 3.2* 3.4*   CL 104 105 107  CO2 23 23 23   GLUCOSE 134* 128* 103*  BUN 11 11 11   CREATININE 0.80 0.76 0.67  CALCIUM 9.4 8.1* 8.0*  MG  --  0.4*  --    GFR: Estimated Creatinine Clearance: 84.3 mL/min (by C-G formula based on SCr of 0.67 mg/dL). Liver Function Tests: Recent Labs  Lab 12/28/18 1601 12/29/18 0531  AST 26 17  ALT 16 13  ALKPHOS 88 63  BILITOT 1.0 0.8  PROT 7.4 5.4*  ALBUMIN 4.0 3.0*   Recent Labs  Lab 12/28/18 1601  LIPASE 25   No results for input(s): AMMONIA in the last 168 hours. Coagulation Profile: No results for input(s): INR, PROTIME in the last 168 hours. Cardiac Enzymes: No results for input(s): CKTOTAL, CKMB, CKMBINDEX, TROPONINI in the last 168 hours. BNP (last 3 results) No results for input(s): PROBNP in the last 8760 hours. HbA1C: Recent Labs    12/29/18 0059  HGBA1C 6.4*   CBG: Recent Labs  Lab 12/29/18 0728 12/29/18 1131 12/29/18 1638  GLUCAP 83 131* 82   Lipid Profile: No results for input(s): CHOL, HDL, LDLCALC, TRIG, CHOLHDL, LDLDIRECT in the last 72 hours. Thyroid Function Tests: No results for input(s): TSH, T4TOTAL, FREET4, T3FREE, THYROIDAB in the last 72 hours. Anemia Panel: No results for input(s): VITAMINB12, FOLATE, FERRITIN, TIBC, IRON, RETICCTPCT in the last 72 hours. Sepsis Labs: No results for input(s): PROCALCITON, LATICACIDVEN in the last 168 hours.  Recent Results (from the past 240 hour(s))  SARS CORONAVIRUS 2 (TAT 6-24 HRS) Nasopharyngeal Nasopharyngeal Swab     Status: None   Collection Time: 12/28/18  9:31 PM   Specimen: Nasopharyngeal Swab  Result Value Ref Range Status   SARS Coronavirus 2 NEGATIVE NEGATIVE Final    Comment: (NOTE) SARS-CoV-2 target nucleic acids are NOT DETECTED. The SARS-CoV-2 RNA is generally detectable in upper and lower respiratory specimens during the acute phase of infection. Negative results do not preclude SARS-CoV-2 infection, do not rule out co-infections with other pathogens, and  should not be used as the sole basis for treatment or other patient management decisions. Negative results must be combined with clinical observations, patient history, and epidemiological information. The expected result is Negative. Fact Sheet for Patients: SugarRoll.be Fact Sheet for Healthcare Providers: https://www.woods-mathews.com/ This test is not yet approved or cleared by the [place] FDA and  has been authorized for detection and/or diagnosis of SARS-CoV-2 by FDA under an Emergency Use Authorization (EUA). This EUA will remain  in effect (meaning this test can be used) for the duration of the COVID-19 declaration under Section 56 4(b)(1) of the Act, 21 U.S.C. section 360bbb-3(b)(1), unless the authorization is terminated or revoked sooner. Performed at [facility], [place] [address], [place], [place] [address]          Radiology Studies: Ct Abdomen Pelvis W Contrast  Result Date: 12/28/2018 CLINICAL DATA:  Diverticulitis EXAM: CT ABDOMEN AND  PELVIS WITH CONTRAST TECHNIQUE: Multidetector CT imaging of the abdomen and pelvis was performed using the standard protocol following bolus administration of intravenous contrast. CONTRAST:  121mL OMNIPAQUE IOHEXOL 300 MG/ML  SOLN COMPARISON:  November 03, 2018 FINDINGS: Lower chest: The visualized heart size within normal limits. No pericardial fluid/thickening. No hiatal hernia. The visualized portions of the lungs are clear. Hepatobiliary: The liver is normal in density without focal abnormality.The main portal vein is patent. No evidence of calcified gallstones, gallbladder wall thickening or biliary dilatation. Pancreas: Unremarkable. No pancreatic ductal dilatation or surrounding inflammatory changes. Spleen: Normal in size without focal abnormality. Adrenals/Urinary Tract: Both adrenal glands appear normal. Tiny low-density lesions are seen within both kidneys as on the prior exam.  Stomach/Bowel: The stomach and small bowel are normal in appearance. Again noted is a focal segment of distal sigmoid colon diverticulitis with diffuse bowel wall thickening. Tiny foci of free air are again noted superior to area at with non loculated fluid. There likely increased appearance of the non loculated collection since the prior exam the appendix is unremarkable. Vascular/Lymphatic: There are no enlarged mesenteric, retroperitoneal, or pelvic lymph nodes. Scattered aortic atherosclerotic calcifications are seen without aneurysmal dilatation. Again noted is a aorto bi femoral stent graft. Reproductive: The prostate is heterogeneous in appearance. Other: No evidence of abdominal wall mass or hernia. Musculoskeletal: No acute or significant osseous findings. Prior posterior lumbar spine fixation is seen. IMPRESSION: Again noted is distal sigmoid diverticulitis with adjacent micro perforation non loculated fluid which could represent phlegmon. The non-loculated collection appears slightly more prominent than the prior exam. Electronically Signed   By: Prudencio Pair M.D.   On: 12/28/2018 19:51        Scheduled Meds:  bisacodyl  10 mg Rectal Daily   insulin aspart  0-9 Units Subcutaneous Q4H   lip balm  1 application Topical BID   pantoprazole (PROTONIX) IV  40 mg Intravenous QHS   psyllium  1 packet Oral Daily   saccharomyces boulardii  250 mg Oral BID   Continuous Infusions:  anidulafungin     lactated ringers     lactated ringers     lactated ringers 100 mL/hr at 12/29/18 0552   meropenem (MERREM) IV 1 g (12/29/18 1450)   methocarbamol (ROBAXIN) IV     ondansetron (ZOFRAN) IV       LOS: 1 day       [name], MD [facility] Pager [phone] If 7PM-7AM, please contact night-coverage [email or web address] Password [email or web address] 12/29/2018, 4:55 PM

## 2018-12-29 NOTE — Progress Notes (Addendum)
Patient has been independent and not calling for help most of the shift. Went in to patients room to assess and offer any assistance. Ordered a diet tray and will be arriving.   [place]

## 2018-12-29 NOTE — Progress Notes (Signed)
SAJED BRIDENBAUGH [id number] 20-Aug-1950  CARE TEAM:  PCP: [name], MD  Outpatient Care Team: Patient Care Team: [name], MD as PCP - General (Family Medicine) [name], MD as PCP - Cardiology (Cardiology) Early, [name], MD as Consulting Physician (Vascular Surgery) [name], MD as Consulting Physician (Orthopedic Surgery) [name], MD as Consulting Physician (Cardiology) [name], MD as Consulting Physician (Colon and Rectal Surgery) [name], MD as Consulting Physician (Gastroenterology)  Inpatient Treatment Team: Treatment Team: Attending Provider: [name], MD; Consulting Physician: [name], Md, MD; Rounding Team: [name], MD; Technician: [name], NT; Registered Nurse: [name], RN   Problem List:   Principal Problem:   Diverticulitis of colon with perforation Active Problems:   Coronary artery disease involving native coronary artery of native heart without angina pectoris   Coronary stent 2008  restenosis due to progression of disease s/p restent 2010   Peripheral vascular disease ([place])   Obesity   Diabetes mellitus with peripheral artery disease ([place])   Dyslipidemia   OSA (obstructive sleep apnea)   BPPV (benign paroxysmal positional vertigo)   Chronic anticoagulation   Status post aortobifemoral bypass surgery 2013   Left-sided low back pain with left-sided sciatica   Nausea & vomiting   Hypokalemia   Acute diverticulitis of intestine   Hypomagnesemia      * No surgery found *      Assessment  While there is no hard indication require emergency surgery, he is failing to thrive.  IV fluid hydration.  IV antibiotics.  He seemed to improve with meropenem.  Agree with restarting that.  Add Eraxis for antifungal coverage as well.  Most likely would benefit from repeat CT scan in 4-5 days to rule out abscess formation or further progression.  If improved, can retry antibiotics until planned  elective surgery.  If worsened may require drainage or ultimately Hartmann resection  Hypokalemia-correcting  Severe hypomagnesemia.  Give additional 4 g IV.  I am skeptical that he will tolerate oral magnesium with its laxative effect and the patient and is already vomiting.    Hypertension, diabetes, hyperlipidemia per primary service.  Sleep apnea per primary service.  Hold anticoagulation with Plavix.  Ideally would await 5 days from holding before proceeding with surgery.  He is not in severe shock requiring emergent surgery at this time  This is been simmering for much of this year.  Hopefully can stabilize and turn around and still plan resection later this month when the hopes of the inflammation will calm down more and he had a chance to improve his nutritional status..  Most likely he will need to stay on antibiotics until surgery.  He may require surgery this admission if he does not improve.  Dwindling into moderate malnutrition.  Try to supplemental shakes.  NG tube if worse.  Dulcolax suppository to encourage bowel function since there is no hard evidence of any colon obstruction.  TNA if cannot keep anything down.  -VTE prophylaxis- SCDs, etc -mobilize as tolerated to help recovery  12/29/2018    Subjective: (Chief complaint)  Pain under better control.  Feeling wiped out.  Less nauseated than yesterday.  That was his worst day  Objective:  Vital signs:  Vitals:   12/28/18 2245 12/28/18 2330 12/29/18 0026 12/29/18 0425  BP: (!) 142/72 131/73 127/76 130/83  Pulse: 85 100 89 86  Resp: (!) 23 (!) 25 (!) 21 20  Temp:   99.2 F (37.3  C) 98.3 F (36.8 C)  TempSrc:   Oral Oral  SpO2: 95% 91% 95% 95%  Weight:      Height:        Last BM Date: 12/28/18  Intake/Output   Yesterday:  11/06 0701 - 11/07 0700 In: 3161.1 [I.V.:60.3; IV Piggyback:3100.8] Out: -  This shift:  No intake/output data recorded.  Bowel function:  Flatus: No  BM:  No  Drain:  (No drain)   Physical Exam:  General: Pt awake/alert/oriented x4 in mild acute distress.  Looks tired/wiped out. Eyes: PERRL, normal EOM.  Sclera clear.  No icterus Neuro: CN II-XII intact w/o focal sensory/motor deficits. Lymph: No head/neck/groin lymphadenopathy Psych:  No delerium/psychosis/paranoia HENT: Normocephalic, Mucus membranes moist.  No thrush Neck: Supple, No tracheal deviation Chest: No chest wall pain w good excursion CV:  Pulses intact.  Regular rhythm MS: Normal AROM mjr joints.  No obvious deformity  Abdomen: Soft.  Nondistended.  Tenderness at Left lower quadrant mild only..  No evidence of peritonitis.  No incarcerated hernias.  Ext:  No deformity.  No mjr edema.  No cyanosis Skin: No petechiae / purpura  Results:   Cultures: Recent Results (from the past 720 hour(s))  SARS CORONAVIRUS 2 (TAT 6-24 HRS) Nasopharyngeal Nasopharyngeal Swab     Status: None   Collection Time: 12/28/18  9:31 PM   Specimen: Nasopharyngeal Swab  Result Value Ref Range Status   SARS Coronavirus 2 NEGATIVE NEGATIVE Final    Comment: (NOTE) SARS-CoV-2 target nucleic acids are NOT DETECTED. The SARS-CoV-2 RNA is generally detectable in upper and lower respiratory specimens during the acute phase of infection. Negative results do not preclude SARS-CoV-2 infection, do not rule out co-infections with other pathogens, and should not be used as the sole basis for treatment or other patient management decisions. Negative results must be combined with clinical observations, patient history, and epidemiological information. The expected result is Negative. Fact Sheet for Patients: SugarRoll.be Fact Sheet for Healthcare Providers: https://www.woods-mathews.com/ This test is not yet approved or cleared by the [place] FDA and  has been authorized for detection and/or diagnosis of SARS-CoV-2 by FDA under an Emergency Use Authorization (EUA).  This EUA will remain  in effect (meaning this test can be used) for the duration of the COVID-19 declaration under Section 56 4(b)(1) of the Act, 21 U.S.C. section 360bbb-3(b)(1), unless the authorization is terminated or revoked sooner. Performed at [facility], [place] [address], [place], [place] [address]     Labs: Results for orders placed or performed during the hospital encounter of 12/28/18 (from the past 48 hour(s))  Lipase, blood     Status: None   Collection Time: 12/28/18  4:01 PM  Result Value Ref Range   Lipase 25 11 - 51 U/L    Comment: Performed at [facility], [place] [address], [place], [place] [address]  Comprehensive metabolic panel     Status: Abnormal   Collection Time: 12/28/18  4:01 PM  Result Value Ref Range   Sodium 143 135 - 145 mmol/L   Potassium 3.4 (L) 3.5 - 5.1 mmol/L   Chloride 104 98 - 111 mmol/L   CO2 23 22 - 32 mmol/L   Glucose, Bld 134 (H) 70 - 99 mg/dL   BUN 11 8 - 23 mg/dL   Creatinine, Ser 0.80 0.61 - 1.24 mg/dL   Calcium 9.4 8.9 - 10.3 mg/dL   Total Protein 7.4 6.5 - 8.1 g/dL   Albumin 4.0 3.5 -  5.0 g/dL   AST 26 15 - 41 U/L   ALT 16 0 - 44 U/L   Alkaline Phosphatase 88 38 - 126 U/L   Total Bilirubin 1.0 0.3 - 1.2 mg/dL   GFR calc non Af Amer >60 >60 mL/min   GFR calc Af Amer >60 >60 mL/min   Anion gap 16 (H) 5 - 15    Comment: Performed at [facility], [place] [address] [place], [place] [address]  Urinalysis, Routine w reflex microscopic     Status: Abnormal   Collection Time: 12/28/18  4:01 PM  Result Value Ref Range   Color, Urine AMBER (A) YELLOW    Comment: BIOCHEMICALS MAY BE AFFECTED BY COLOR   APPearance HAZY (A) CLEAR   Specific Gravity, Urine 1.027 1.005 - 1.030   pH 5.0 5.0 - 8.0   Glucose, UA 50 (A) NEGATIVE mg/dL   Hgb urine dipstick NEGATIVE NEGATIVE   Bilirubin Urine NEGATIVE NEGATIVE   Ketones, ur 5 (A) NEGATIVE mg/dL   Protein, ur >=300 (A) NEGATIVE mg/dL   Nitrite  NEGATIVE NEGATIVE   Leukocytes,Ua NEGATIVE NEGATIVE   RBC / HPF 0-5 0 - 5 RBC/hpf   WBC, UA 6-10 0 - 5 WBC/hpf   Bacteria, UA NONE SEEN NONE SEEN   Mucus PRESENT     Comment: Performed at [facility], [place] [address], [place], [place] [address]  CBC with Differential     Status: Abnormal   Collection Time: 12/28/18  4:01 PM  Result Value Ref Range   WBC 11.9 (H) 4.0 - 10.5 K/uL   RBC 5.08 4.22 - 5.81 MIL/uL   Hemoglobin 15.9 13.0 - 17.0 g/dL   HCT 47.9 39.0 - 52.0 %   MCV 94.3 80.0 - 100.0 fL   MCH 31.3 26.0 - 34.0 pg   MCHC 33.2 30.0 - 36.0 g/dL   RDW 12.9 11.5 - 15.5 %   Platelets 311 150 - 400 K/uL   nRBC 0.0 0.0 - 0.2 %   Neutrophils Relative % 76 %   Neutro Abs 9.1 (H) 1.7 - 7.7 K/uL   Lymphocytes Relative 16 %   Lymphs Abs 1.9 0.7 - 4.0 K/uL   Monocytes Relative 7 %   Monocytes Absolute 0.8 0.1 - 1.0 K/uL   Eosinophils Relative 1 %   Eosinophils Absolute 0.1 0.0 - 0.5 K/uL   Basophils Relative 0 %   Basophils Absolute 0.0 0.0 - 0.1 K/uL   Immature Granulocytes 0 %   Abs Immature Granulocytes 0.04 0.00 - 0.07 K/uL    Comment: Performed at [facility], [place] [address], [place], [place] [address]  SARS CORONAVIRUS 2 (TAT 6-24 HRS) Nasopharyngeal Nasopharyngeal Swab     Status: None   Collection Time: 12/28/18  9:31 PM   Specimen: Nasopharyngeal Swab  Result Value Ref Range   SARS Coronavirus 2 NEGATIVE NEGATIVE    Comment: (NOTE) SARS-CoV-2 target nucleic acids are NOT DETECTED. The SARS-CoV-2 RNA is generally detectable in upper and lower respiratory specimens during the acute phase of infection. Negative results do not preclude SARS-CoV-2 infection, do not rule out co-infections with other pathogens, and should not be used as the sole basis for treatment or other patient management decisions. Negative results must be combined with clinical observations, patient history, and epidemiological information. The expected result is  Negative. Fact Sheet for Patients: SugarRoll.be Fact Sheet for Healthcare Providers: https://www.woods-mathews.com/ This test is not yet approved or cleared by the [place]  FDA and  has been authorized for detection and/or diagnosis of SARS-CoV-2 by FDA under an Emergency Use Authorization (EUA). This EUA will remain  in effect (meaning this test can be used) for the duration of the COVID-19 declaration under Section 56 4(b)(1) of the Act, 21 U.S.C. section 360bbb-3(b)(1), unless the authorization is terminated or revoked sooner. Performed at [facility], [place] [address], [place], [place] [address]   Magnesium - add on     Status: Abnormal   Collection Time: 12/29/18 12:59 AM  Result Value Ref Range   Magnesium 0.4 (LL) 1.7 - 2.4 mg/dL    Comment: CRITICAL RESULT CALLED TO, READ BACK BY AND VERIFIED WITH: T RYAN,RN 12/29/18 [id number] RHOLMES Performed at [facility], [place] [address] [place], [place] [address]   Prealbumin - add on     Status: Abnormal   Collection Time: 12/29/18 12:59 AM  Result Value Ref Range   Prealbumin 12.0 (L) 18 - 38 mg/dL    Comment: Performed at [facility], [place] [address] [place], [place] [id number]  Basic metabolic panel     Status: Abnormal   Collection Time: 12/29/18 12:59 AM  Result Value Ref Range   Sodium 141 135 - 145 mmol/L   Potassium 3.2 (L) 3.5 - 5.1 mmol/L   Chloride 105 98 - 111 mmol/L   CO2 23 22 - 32 mmol/L   Glucose, Bld 128 (H) 70 - 99 mg/dL   BUN 11 8 - 23 mg/dL   Creatinine, Ser 0.76 0.61 - 1.24 mg/dL   Calcium 8.1 (L) 8.9 - 10.3 mg/dL   GFR calc non Af Amer >60 >60 mL/min   GFR calc Af Amer >60 >60 mL/min   Anion gap 13 5 - 15    Comment: Performed at [facility], [place] [address], [place], [place] [address]  CBC     Status: Abnormal   Collection Time: 12/29/18 12:59 AM  Result Value Ref Range   WBC 11.7 (H) 4.0 - 10.5  K/uL   RBC 4.35 4.22 - 5.81 MIL/uL   Hemoglobin 13.7 13.0 - 17.0 g/dL   HCT 41.8 39.0 - 52.0 %   MCV 96.1 80.0 - 100.0 fL   MCH 31.5 26.0 - 34.0 pg   MCHC 32.8 30.0 - 36.0 g/dL   RDW 13.0 11.5 - 15.5 %   Platelets 217 150 - 400 K/uL   nRBC 0.0 0.0 - 0.2 %    Comment: Performed at [facility], [place] [address] [place], [place] [address]  Hemoglobin A1c     Status: Abnormal   Collection Time: 12/29/18 12:59 AM  Result Value Ref Range   Hgb A1c MFr Bld 6.4 (H) 4.8 - 5.6 %    Comment: (NOTE) Pre diabetes:          5.7%-6.4% Diabetes:              >6.4% Glycemic control for   <7.0% adults with diabetes    Mean Plasma Glucose 136.98 mg/dL    Comment: Performed at [place] [address] [place],  [address]  Comprehensive metabolic panel     Status: Abnormal   Collection Time: 12/29/18  5:31 AM  Result Value Ref Range   Sodium 143 135 - 145 mmol/L   Potassium 3.4 (L) 3.5 - 5.1 mmol/L   Chloride 107 98 - 111 mmol/L   CO2 23 22 - 32 mmol/L   Glucose, Bld  103 (H) 70 - 99 mg/dL   BUN 11 8 - 23 mg/dL   Creatinine, Ser 0.67 0.61 - 1.24 mg/dL   Calcium 8.0 (L) 8.9 - 10.3 mg/dL   Total Protein 5.4 (L) 6.5 - 8.1 g/dL   Albumin 3.0 (L) 3.5 - 5.0 g/dL   AST 17 15 - 41 U/L   ALT 13 0 - 44 U/L   Alkaline Phosphatase 63 38 - 126 U/L   Total Bilirubin 0.8 0.3 - 1.2 mg/dL   GFR calc non Af Amer >60 >60 mL/min   GFR calc Af Amer >60 >60 mL/min   Anion gap 13 5 - 15    Comment: Performed at [facility], [place] [address] [place], [place] [address]  CBC with Differential/Platelet     Status: Abnormal   Collection Time: 12/29/18  5:31 AM  Result Value Ref Range   WBC 9.9 4.0 - 10.5 K/uL   RBC 4.17 (L) 4.22 - 5.81 MIL/uL   Hemoglobin 13.0 13.0 - 17.0 g/dL   HCT 39.7 39.0 - 52.0 %   MCV 95.2 80.0 - 100.0 fL   MCH 31.2 26.0 - 34.0 pg   MCHC 32.7 30.0 - 36.0 g/dL   RDW 12.9 11.5 - 15.5 %   Platelets 218 150 - 400 K/uL   nRBC 0.0 0.0 -  0.2 %   Neutrophils Relative % 79 %   Neutro Abs 7.7 1.7 - 7.7 K/uL   Lymphocytes Relative 15 %   Lymphs Abs 1.5 0.7 - 4.0 K/uL   Monocytes Relative 6 %   Monocytes Absolute 0.6 0.1 - 1.0 K/uL   Eosinophils Relative 0 %   Eosinophils Absolute 0.0 0.0 - 0.5 K/uL   Basophils Relative 0 %   Basophils Absolute 0.0 0.0 - 0.1 K/uL   Immature Granulocytes 0 %   Abs Immature Granulocytes 0.03 0.00 - 0.07 K/uL    Comment: Performed at [facility], [place] [address] [place], [place] [address]  Glucose, capillary     Status: None   Collection Time: 12/29/18  7:28 AM  Result Value Ref Range   Glucose-Capillary 83 70 - 99 mg/dL    Imaging / Studies: Ct Abdomen Pelvis W Contrast  Result Date: 12/28/2018 CLINICAL DATA:  Diverticulitis EXAM: CT ABDOMEN AND PELVIS WITH CONTRAST TECHNIQUE: Multidetector CT imaging of the abdomen and pelvis was performed using the standard protocol following bolus administration of intravenous contrast. CONTRAST:  126mL OMNIPAQUE IOHEXOL 300 MG/ML  SOLN COMPARISON:  November 03, 2018 FINDINGS: Lower chest: The visualized heart size within normal limits. No pericardial fluid/thickening. No hiatal hernia. The visualized portions of the lungs are clear. Hepatobiliary: The liver is normal in density without focal abnormality.The main portal vein is patent. No evidence of calcified gallstones, gallbladder wall thickening or biliary dilatation. Pancreas: Unremarkable. No pancreatic ductal dilatation or surrounding inflammatory changes. Spleen: Normal in size without focal abnormality. Adrenals/Urinary Tract: Both adrenal glands appear normal. Tiny low-density lesions are seen within both kidneys as on the prior exam. Stomach/Bowel: The stomach and small bowel are normal in appearance. Again noted is a focal segment of distal sigmoid colon diverticulitis with diffuse bowel wall thickening. Tiny foci of free air are again noted superior to area at with non loculated  fluid. There likely increased appearance of the non loculated collection since the prior exam the appendix is unremarkable. Vascular/Lymphatic: There are no enlarged mesenteric, retroperitoneal, or pelvic lymph nodes. Scattered aortic atherosclerotic calcifications are seen without aneurysmal  dilatation. Again noted is a aorto bi femoral stent graft. Reproductive: The prostate is heterogeneous in appearance. Other: No evidence of abdominal wall mass or hernia. Musculoskeletal: No acute or significant osseous findings. Prior posterior lumbar spine fixation is seen. IMPRESSION: Again noted is distal sigmoid diverticulitis with adjacent micro perforation non loculated fluid which could represent phlegmon. The non-loculated collection appears slightly more prominent than the prior exam. Electronically Signed   By: Prudencio Pair M.D.   On: 12/28/2018 19:51    Medications / Allergies: per chart  Antibiotics: Anti-infectives (From admission, onward)   Start     Dose/Rate Route Frequency Ordered Stop   12/29/18 2200  anidulafungin (ERAXIS) 100 mg in sodium chloride 0.9 % 100 mL IVPB     100 mg 78 mL/hr over 100 Minutes Intravenous Every 24 hours 12/28/18 2119     12/29/18 1400  neomycin (MYCIFRADIN) tablet 1,000 mg  Status:  Discontinued     1,000 mg Oral 3 times per day 12/28/18 2340 12/29/18 0217   12/29/18 1400  metroNIDAZOLE (FLAGYL) tablet 1,000 mg  Status:  Discontinued     1,000 mg Oral 3 times per day 12/28/18 2340 12/29/18 0217   12/29/18 0600  cefoTEtan (CEFOTAN) 2 g in sodium chloride 0.9 % 100 mL IVPB  Status:  Discontinued     2 g 200 mL/hr over 30 Minutes Intravenous On call to O.R. 12/28/18 2340 12/29/18 0217   12/28/18 2345  clindamycin (CLEOCIN) 900 mg, gentamicin (GARAMYCIN) 240 mg in sodium chloride 0.9 % 1,000 mL for intraperitoneal lavage  Status:  Discontinued      Irrigation To Surgery 12/28/18 2340 12/29/18 0217   12/28/18 2200  meropenem (MERREM) 1 g in sodium chloride 0.9 % 100  mL IVPB     1 g 200 mL/hr over 30 Minutes Intravenous Every 8 hours 12/28/18 2042     12/28/18 2130  anidulafungin (ERAXIS) 200 mg in sodium chloride 0.9 % 200 mL IVPB     200 mg 78 mL/hr over 200 Minutes Intravenous  Once 12/28/18 2119 12/29/18 0229   12/28/18 2045  piperacillin-tazobactam (ZOSYN) IVPB 3.375 g  Status:  Discontinued     3.375 g 12.5 mL/hr over 240 Minutes Intravenous Every 8 hours 12/28/18 2040 12/28/18 2042   12/28/18 2045  anidulafungin (ERAXIS) 100 mg in sodium chloride 0.9 % 100 mL IVPB  Status:  Discontinued     100 mg 78 mL/hr over 100 Minutes Intravenous Every 24 hours 12/28/18 2042 12/28/18 2118   12/28/18 2030  meropenem (MERREM) 1 g in sodium chloride 0.9 % 100 mL IVPB  Status:  Discontinued     1 g 200 mL/hr over 30 Minutes Intravenous  Once 12/28/18 2016 12/28/18 2040   12/28/18 2015  ertapenem (INVANZ) 1,000 mg in sodium chloride 0.9 % 100 mL IVPB  Status:  Discontinued     1 g 200 mL/hr over 30 Minutes Intravenous  Once 12/28/18 2001 12/28/18 2016        Note: Portions of this report may have been transcribed using voice recognition software. Every effort was made to ensure accuracy; however, inadvertent computerized transcription errors may be present.   Any transcriptional errors that result from this process are unintentional.     [name], MD, FACS, MASCRS Gastrointestinal and Minimally Invasive Surgery    [address] [place], [place] [address] [phone] Main / Paging [phone] Fax

## 2018-12-29 NOTE — Progress Notes (Signed)
Instructor agrees w/ SN previous note.

## 2018-12-29 NOTE — Progress Notes (Signed)
CRITICAL VALUE ALERT  Critical Value:  Mag 0.4  Date & Time Notied:  0609  Provider Notified: yes  Orders Received/Actions taken: pending

## 2018-12-30 LAB — CBC
HCT: 37 % — ABNORMAL LOW (ref 39.0–52.0)
Hemoglobin: 12 g/dL — ABNORMAL LOW (ref 13.0–17.0)
MCH: 31.5 pg (ref 26.0–34.0)
MCHC: 32.4 g/dL (ref 30.0–36.0)
MCV: 97.1 fL (ref 80.0–100.0)
Platelets: 209 10*3/uL (ref 150–400)
RBC: 3.81 MIL/uL — ABNORMAL LOW (ref 4.22–5.81)
RDW: 13.1 % (ref 11.5–15.5)
WBC: 7.5 10*3/uL (ref 4.0–10.5)
nRBC: 0 % (ref 0.0–0.2)

## 2018-12-30 LAB — GLUCOSE, CAPILLARY
Glucose-Capillary: 110 mg/dL — ABNORMAL HIGH (ref 70–99)
Glucose-Capillary: 113 mg/dL — ABNORMAL HIGH (ref 70–99)
Glucose-Capillary: 132 mg/dL — ABNORMAL HIGH (ref 70–99)
Glucose-Capillary: 88 mg/dL (ref 70–99)
Glucose-Capillary: 90 mg/dL (ref 70–99)
Glucose-Capillary: 94 mg/dL (ref 70–99)

## 2018-12-30 LAB — BASIC METABOLIC PANEL
Anion gap: 8 (ref 5–15)
BUN: 6 mg/dL — ABNORMAL LOW (ref 8–23)
CO2: 24 mmol/L (ref 22–32)
Calcium: 8 mg/dL — ABNORMAL LOW (ref 8.9–10.3)
Chloride: 110 mmol/L (ref 98–111)
Creatinine, Ser: 0.6 mg/dL — ABNORMAL LOW (ref 0.61–1.24)
GFR calc Af Amer: >60 mL/min (ref >60)
GFR calc non Af Amer: >60 mL/min (ref >60)
Glucose, Bld: 95 mg/dL (ref 70–99)
Potassium: 3.7 mmol/L (ref 3.5–5.1)
Sodium: 142 mmol/L (ref 135–145)

## 2018-12-30 LAB — MAGNESIUM: Magnesium: 1.6 mg/dL — ABNORMAL LOW (ref 1.7–2.4)

## 2018-12-30 MED ORDER — POLYETHYLENE GLYCOL 3350 17 G PO PACK
17.0000 g | PACK | Freq: Two times a day (BID) | ORAL | Status: DC
  Administered 2018-12-30 – 2019-01-01 (×5): 17 g via ORAL
  Filled 2018-12-30 (×6): qty 1

## 2018-12-30 MED ORDER — POTASSIUM CHLORIDE CRYS ER 20 MEQ PO TBCR
40.0000 meq | EXTENDED_RELEASE_TABLET | Freq: Once | ORAL | Status: AC
  Administered 2018-12-30: 11:00:00 40 meq via ORAL
  Filled 2018-12-30: qty 2

## 2018-12-30 MED ORDER — MAGNESIUM SULFATE 50 % IJ SOLN
3.0000 g | Freq: Once | INTRAVENOUS | Status: AC
  Administered 2018-12-30: 3 g via INTRAVENOUS
  Filled 2018-12-30: qty 6

## 2018-12-30 MED ORDER — PANTOPRAZOLE SODIUM 40 MG PO TBEC
40.0000 mg | DELAYED_RELEASE_TABLET | Freq: Every day | ORAL | Status: DC
  Administered 2018-12-30 – 2018-12-31 (×2): 40 mg via ORAL
  Filled 2018-12-30 (×2): qty 1

## 2018-12-30 NOTE — Progress Notes (Signed)
PROGRESS NOTE    Ray Brown  [id number] DOB: 1950/12/18 DOA: 12/28/2018 PCP: [name], MD   Brief Narrative:   68 year old male with prior h/o CAD, type 2 DM, PVD, sleep apnea not on CPAP, started having bouts of diverticulitis from June 2020, another episode in September, presents with persistent nausea, vomiting , abdominal pain and diarrhea. On arrival ED CT abd and pelvis show sigmoid diverticulitis with microperforation and phlegmon and increasing fluid collection. Surgery consulted, recommended to continue with IV antibiotics, IV fluids, and a repeat CT abd and pelvis in 5 days to watch for improvement , if no improvement, plan for surgical resection.  Patient continues to improve his nausea vomiting and abdominal pain have improved and surgery has advanced his diet to soft diet today.  Assessment & Plan:   Principal Problem:   Diverticulitis of colon with perforation Active Problems:   Peripheral vascular disease ([place])   Obesity   Diabetes mellitus with peripheral artery disease (HCC)   Coronary artery disease involving native coronary artery of native heart without angina pectoris   Dyslipidemia   OSA (obstructive sleep apnea)   BPPV (benign paroxysmal positional vertigo)   Chronic anticoagulation   Coronary stent 2008  restenosis due to progression of disease s/p restent 2010   Status post aortobifemoral bypass surgery 2013   Left-sided low back pain with left-sided sciatica   Nausea & vomiting   Hypokalemia   Acute diverticulitis of intestine   Hypomagnesemia   Protein-calorie malnutrition, moderate (HCC)   Sigmoid Diverticulitis : Improving with improvement in his nausea, vomiting diarrhea and abdominal pain.  He was able to tolerate clear liquid diet without any issues.  Surgery advance diet to soft today.  Meanwhile continue with IV fluids and antibiotics and symptomatic management with pain control and antiemetics as needed. Change to oral PPI today    Hypokalemia: replaced.   Hypomagnesemia replaced.  Repeat in the morning  Mod protein calorie malnutrition;  Nutritionist on board.    CAD s/p PCI:  Hold plavix for possible procedure later on.  Pt denies any chest pain or sob.    Mild anemia of chronic disease Hemoglobin stable around 12 possibly a component of hemodilution.  Continue to monitor   Diabetes Mellitus:  CBG (last 3)  Recent Labs    12/29/18 2352 12/30/18 0415 12/30/18 0748  GLUCAP 88 94 90   Resume SSI.  A1c is 6.4.   Dyslipidemia Follow-up lipid panel as outpatient.  OSA: not on CPAP.   DVT prophylaxis: scd's Code Status: full code.  Family Communication: family at bedside.  Disposition Plan: pending improvement of diverticulitis.   Consultants:   Surgery   Procedures: none.   Antimicrobials: IV meropenem and IV Eraxis.    Subjective: Patient denies any nausea or vomiting.  Abdominal pain has resolved.  Bowel movements have improved.  Objective: Vitals:   12/29/18 1015 12/29/18 1438 12/29/18 2038 12/30/18 0412  BP: 119/68 126/88 110/64 117/62  Pulse: 75 89 64 66  Resp: 18 20 18 18   Temp: 98 F (36.7 C) 98.1 F (36.7 C) 98.6 F (37 C) 98.1 F (36.7 C)  TempSrc: Oral Oral Oral Oral  SpO2: 97% 98% 97% 94%  Weight:      Height:        Intake/Output Summary (Last 24 hours) at 12/30/2018 1016 Last data filed at 12/30/2018 0900 Gross per 24 hour  Intake 3496.7 ml  Output 1600 ml  Net 1896.7 ml   Danley Danker  Weights   12/28/18 1515  Weight: 78 kg    Examination:  General exam: Alert and comfortable not in any kind of distress. Respiratory system: Clear to auscultation, no wheezing or rhonchi Cardiovascular system: S1-S2 heard, regular rate rhythm, no JVD. Gastrointestinal system: Abdomen is soft, nontender, nondistended, bowel sounds are normal Central nervous system: Alert and oriented and grossly nonfocal Extremities: No pedal edema, cyanosis or clubbing Skin: No rashes or  lesions Psychiatry: Mood is appropriate    Data Reviewed: I have personally reviewed following labs and imaging studies  CBC: Recent Labs  Lab 12/28/18 1601 12/29/18 0059 12/29/18 0531 12/30/18 0519  WBC 11.9* 11.7* 9.9 7.5  NEUTROABS 9.1*  --  7.7  --   HGB 15.9 13.7 13.0 12.0*  HCT 47.9 41.8 39.7 37.0*  MCV 94.3 96.1 95.2 97.1  PLT 311 217 218 [id number]   Basic Metabolic Panel: Recent Labs  Lab 12/28/18 1601 12/29/18 0059 12/29/18 0531 12/30/18 0519  NA 143 141 143 142  K 3.4* 3.2* 3.4* 3.7  CL 104 105 107 110  CO2 23 23 23 24   GLUCOSE 134* 128* 103* 95  BUN 11 11 11  6*  CREATININE 0.80 0.76 0.67 0.60*  CALCIUM 9.4 8.1* 8.0* 8.0*  MG  --  0.4*  --  1.6*   GFR: Estimated Creatinine Clearance: 84.3 mL/min (A) (by C-G formula based on SCr of 0.6 mg/dL (L)). Liver Function Tests: Recent Labs  Lab 12/28/18 1601 12/29/18 0531  AST 26 17  ALT 16 13  ALKPHOS 88 63  BILITOT 1.0 0.8  PROT 7.4 5.4*  ALBUMIN 4.0 3.0*   Recent Labs  Lab 12/28/18 1601  LIPASE 25   No results for input(s): AMMONIA in the last 168 hours. Coagulation Profile: No results for input(s): INR, PROTIME in the last 168 hours. Cardiac Enzymes: No results for input(s): CKTOTAL, CKMB, CKMBINDEX, TROPONINI in the last 168 hours. BNP (last 3 results) No results for input(s): PROBNP in the last 8760 hours. HbA1C: Recent Labs    12/29/18 0059  HGBA1C 6.4*   CBG: Recent Labs  Lab 12/29/18 1638 12/29/18 2042 12/29/18 2352 12/30/18 0415 12/30/18 0748  GLUCAP 82 108* 88 94 90   Lipid Profile: No results for input(s): CHOL, HDL, LDLCALC, TRIG, CHOLHDL, LDLDIRECT in the last 72 hours. Thyroid Function Tests: No results for input(s): TSH, T4TOTAL, FREET4, T3FREE, THYROIDAB in the last 72 hours. Anemia Panel: No results for input(s): VITAMINB12, FOLATE, FERRITIN, TIBC, IRON, RETICCTPCT in the last 72 hours. Sepsis Labs: No results for input(s): PROCALCITON, LATICACIDVEN in the last 168  hours.  Recent Results (from the past 240 hour(s))  SARS CORONAVIRUS 2 (TAT 6-24 HRS) Nasopharyngeal Nasopharyngeal Swab     Status: None   Collection Time: 12/28/18  9:31 PM   Specimen: Nasopharyngeal Swab  Result Value Ref Range Status   SARS Coronavirus 2 NEGATIVE NEGATIVE Final    Comment: (NOTE) SARS-CoV-2 target nucleic acids are NOT DETECTED. The SARS-CoV-2 RNA is generally detectable in upper and lower respiratory specimens during the acute phase of infection. Negative results do not preclude SARS-CoV-2 infection, do not rule out co-infections with other pathogens, and should not be used as the sole basis for treatment or other patient management decisions. Negative results must be combined with clinical observations, patient history, and epidemiological information. The expected result is Negative. Fact Sheet for Patients: SugarRoll.be Fact Sheet for Healthcare Providers: https://www.woods-mathews.com/ This test is not yet approved or cleared by the [place] FDA and  has been authorized for detection and/or diagnosis of SARS-CoV-2 by FDA under an Emergency Use Authorization (EUA). This EUA will remain  in effect (meaning this test can be used) for the duration of the COVID-19 declaration under Section 56 4(b)(1) of the Act, 21 U.S.C. section 360bbb-3(b)(1), unless the authorization is terminated or revoked sooner. Performed at [facility], [place] [address] [place], [place] [address]          Radiology Studies: Ct Abdomen Pelvis W Contrast  Result Date: 12/28/2018 CLINICAL DATA:  Diverticulitis EXAM: CT ABDOMEN AND PELVIS WITH CONTRAST TECHNIQUE: Multidetector CT imaging of the abdomen and pelvis was performed using the standard protocol following bolus administration of intravenous contrast. CONTRAST:  132mL OMNIPAQUE IOHEXOL 300 MG/ML  SOLN COMPARISON:  November 03, 2018 FINDINGS: Lower chest: The visualized heart  size within normal limits. No pericardial fluid/thickening. No hiatal hernia. The visualized portions of the lungs are clear. Hepatobiliary: The liver is normal in density without focal abnormality.The main portal vein is patent. No evidence of calcified gallstones, gallbladder wall thickening or biliary dilatation. Pancreas: Unremarkable. No pancreatic ductal dilatation or surrounding inflammatory changes. Spleen: Normal in size without focal abnormality. Adrenals/Urinary Tract: Both adrenal glands appear normal. Tiny low-density lesions are seen within both kidneys as on the prior exam. Stomach/Bowel: The stomach and small bowel are normal in appearance. Again noted is a focal segment of distal sigmoid colon diverticulitis with diffuse bowel wall thickening. Tiny foci of free air are again noted superior to area at with non loculated fluid. There likely increased appearance of the non loculated collection since the prior exam the appendix is unremarkable. Vascular/Lymphatic: There are no enlarged mesenteric, retroperitoneal, or pelvic lymph nodes. Scattered aortic atherosclerotic calcifications are seen without aneurysmal dilatation. Again noted is a aorto bi femoral stent graft. Reproductive: The prostate is heterogeneous in appearance. Other: No evidence of abdominal wall mass or hernia. Musculoskeletal: No acute or significant osseous findings. Prior posterior lumbar spine fixation is seen. IMPRESSION: Again noted is distal sigmoid diverticulitis with adjacent micro perforation non loculated fluid which could represent phlegmon. The non-loculated collection appears slightly more prominent than the prior exam. Electronically Signed   By: Prudencio Pair M.D.   On: 12/28/2018 19:51        Scheduled Meds: . bisacodyl  10 mg Rectal Daily  . insulin aspart  0-9 Units Subcutaneous Q4H  . lip balm  1 application Topical BID  . pantoprazole (PROTONIX) IV  40 mg Intravenous QHS  . potassium chloride  40 mEq  Oral Once  . psyllium  1 packet Oral Daily  . saccharomyces boulardii  250 mg Oral BID   Continuous Infusions: . anidulafungin 100 mg (12/29/18 2231)  . lactated ringers    . lactated ringers    . magnesium sulfate bolus IVPB 3 g (12/30/18 0941)  . meropenem (MERREM) IV 1 g (12/30/18 0617)  . methocarbamol (ROBAXIN) IV    . ondansetron (ZOFRAN) IV       LOS: 2 days       [name], MD [facility] Pager [phone] If 7PM-7AM, please contact night-coverage [email or web address] Password [email or web address] 12/30/2018, 10:16 AM

## 2018-12-30 NOTE — Progress Notes (Signed)

## 2018-12-30 NOTE — Progress Notes (Signed)
Ray Brown [id number] 07/20/1950  CARE TEAM:  PCP: [name], MD  Outpatient Care Team: Patient Care Team: [name], MD as PCP - General (Family Medicine) [name], MD as PCP - Cardiology (Cardiology) Early, [name], MD as Consulting Physician (Vascular Surgery) [name], MD as Consulting Physician (Orthopedic Surgery) [name], MD as Consulting Physician (Cardiology) [name], MD as Consulting Physician (Colon and Rectal Surgery) [name], MD as Consulting Physician (Gastroenterology)  Inpatient Treatment Team: Treatment Team: Attending Provider: [name], MD; Consulting Physician: [name], Md, MD; Rounding Team: [name], MD; Technician: [name], NT; Registered Nurse: [name], RN   Problem List:   Principal Problem:   Diverticulitis of colon with perforation Active Problems:   Coronary artery disease involving native coronary artery of native heart without angina pectoris   Coronary stent 2008  restenosis due to progression of disease s/p restent 2010   Peripheral vascular disease ([place])   Obesity   Diabetes mellitus with peripheral artery disease ([place])   Dyslipidemia   OSA (obstructive sleep apnea)   BPPV (benign paroxysmal positional vertigo)   Chronic anticoagulation   Status post aortobifemoral bypass surgery 2013   Left-sided low back pain with left-sided sciatica   Nausea & vomiting   Hypokalemia   Acute diverticulitis of intestine   Hypomagnesemia   Protein-calorie malnutrition, moderate ([place])      * No surgery found *      Assessment  Improved  Plan:  ADAT Dys1.  Soft diet if better.  If has cramping or nausea, reduced down to blenderized/dysphagia 1 oral liquid diet as tolerated.  MiraLAX twice daily to avoid constipation.  Presume he is getting some partial colonic obstruction for his chronic diverticulitis."  He is turning around.  IV antibiotics.  Improving with  meropenem/Eraxis.  Most likely discharge on IV antibiotics until surgery planned later this month.  If not improved repeat CT scan in 4-5 days to rule out abscess formation or further progression.  If improved, can retry antibiotics until planned elective surgery.  If worsened may require drainage or ultimately Hartmann resection  Hypokalemia-correcting  Severe hypomagnesemia.  Give additional 4 g IV.  I am skeptical that he will tolerate oral magnesium with its laxative effect and the patient and is already vomiting.    Hypertension, diabetes, hyperlipidemia per primary service.  Sleep apnea per primary service.  Hold anticoagulation with Plavix.  Ideally would await 5 days from holding before proceeding with surgery.  He is not in severe shock requiring emergent surgery at this time  This is been simmering for much of this year.  Hopefully can stabilize and turn around and still plan resection later this month when the hopes of the inflammation will calm down more and he had a chance to improve his nutritional status..  Most likely he will need to stay on antibiotics until surgery.  He may require surgery this admission if he does not improve.  Had long discussion with the patient.  His wife is there and asked many questions.  I again recommended my plan of trying to calm this down and give a few more weeks for inflammation to be more minimal.  If that fails, may require more urgent surgery.  However the likelihood of open surgery with colostomy much higher if we do it now.  Patient would like to try and hold off.  Wife concerned but mostly reassured since he is much better over the past 24  hours  Dwindling into moderate malnutrition.  Try to supplemental shakes.  NG tube if worse.  Dulcolax suppository to encourage bowel function since there is no hard evidence of any colon obstruction.  TNA if cannot keep anything down.  -VTE prophylaxis- SCDs, etc -mobilize as tolerated to help recovery   12/30/2018    Subjective: (Chief complaint)  Feels much better overall.  Had bowel movement and flatus.  Tolerated liquids.  Pain nearly gone away.  Objective:  Vital signs:  Vitals:   12/29/18 1015 12/29/18 1438 12/29/18 2038 12/30/18 0412  BP: 119/68 126/88 110/64 117/62  Pulse: 75 89 64 66  Resp: 18 20 18 18   Temp: 98 F (36.7 C) 98.1 F (36.7 C) 98.6 F (37 C) 98.1 F (36.7 C)  TempSrc: Oral Oral Oral Oral  SpO2: 97% 98% 97% 94%  Weight:      Height:        Last BM Date: 12/29/18  Intake/Output   Yesterday:  11/07 0701 - 11/08 0700 In: 3317.7 [P.O.:1064; I.V.:1823.7; IV Piggyback:430] Out: 1300 [Urine:1300] This shift:  No intake/output data recorded.  Bowel function:  Flatus: YES  BM:  YES  Drain: (No drain)   Physical Exam:  General: Pt awake/alert/oriented x4 in no acute distress.  Relaxed.  Reading paper.  Not toxic.  Not sickly. Eyes: PERRL, normal EOM.  Sclera clear.  No icterus Neuro: CN II-XII intact w/o focal sensory/motor deficits. Lymph: No head/neck/groin lymphadenopathy Psych:  No delerium/psychosis/paranoia HENT: Normocephalic, Mucus membranes moist.  No thrush Neck: Supple, No tracheal deviation Chest: No chest wall pain w good excursion CV:  Pulses intact.  Regular rhythm MS: Normal AROM mjr joints.  No obvious deformity  Abdomen: Soft.  Nondistended.  Tenderness at Left lower quadrant only -very mild.  Improved..  No evidence of peritonitis.  No incarcerated hernias.  Ext:  No deformity.  No mjr edema.  No cyanosis Skin: No petechiae / purpura  Results:   Cultures: Recent Results (from the past 720 hour(s))  SARS CORONAVIRUS 2 (TAT 6-24 HRS) Nasopharyngeal Nasopharyngeal Swab     Status: None   Collection Time: 12/28/18  9:31 PM   Specimen: Nasopharyngeal Swab  Result Value Ref Range Status   SARS Coronavirus 2 NEGATIVE NEGATIVE Final    Comment: (NOTE) SARS-CoV-2 target nucleic acids are NOT DETECTED. The  SARS-CoV-2 RNA is generally detectable in upper and lower respiratory specimens during the acute phase of infection. Negative results do not preclude SARS-CoV-2 infection, do not rule out co-infections with other pathogens, and should not be used as the sole basis for treatment or other patient management decisions. Negative results must be combined with clinical observations, patient history, and epidemiological information. The expected result is Negative. Fact Sheet for Patients: SugarRoll.be Fact Sheet for Healthcare Providers: https://www.woods-mathews.com/ This test is not yet approved or cleared by the [place] FDA and  has been authorized for detection and/or diagnosis of SARS-CoV-2 by FDA under an Emergency Use Authorization (EUA). This EUA will remain  in effect (meaning this test can be used) for the duration of the COVID-19 declaration under Section 56 4(b)(1) of the Act, 21 U.S.C. section 360bbb-3(b)(1), unless the authorization is terminated or revoked sooner. Performed at [facility], [place] [address], [place], [place] [address]     Labs: Results for orders placed or performed during the hospital encounter of 12/28/18 (from the past 48 hour(s))  Lipase, blood     Status: None   Collection Time: 12/28/18  4:01 PM  Result Value Ref Range   Lipase 25 11 - 51 U/L    Comment: Performed at [facility], [place] [address], [place], [place] [address]  Comprehensive metabolic panel     Status: Abnormal   Collection Time: 12/28/18  4:01 PM  Result Value Ref Range   Sodium 143 135 - 145 mmol/L   Potassium 3.4 (L) 3.5 - 5.1 mmol/L   Chloride 104 98 - 111 mmol/L   CO2 23 22 - 32 mmol/L   Glucose, Bld 134 (H) 70 - 99 mg/dL   BUN 11 8 - 23 mg/dL   Creatinine, Ser 0.80 0.61 - 1.24 mg/dL   Calcium 9.4 8.9 - 10.3 mg/dL   Total Protein 7.4 6.5 - 8.1 g/dL   Albumin 4.0 3.5 - 5.0 g/dL   AST 26 15 - 41 U/L   ALT  16 0 - 44 U/L   Alkaline Phosphatase 88 38 - 126 U/L   Total Bilirubin 1.0 0.3 - 1.2 mg/dL   GFR calc non Af Amer >60 >60 mL/min   GFR calc Af Amer >60 >60 mL/min   Anion gap 16 (H) 5 - 15    Comment: Performed at [facility], [place] [address] [place], [place] [address]  Urinalysis, Routine w reflex microscopic     Status: Abnormal   Collection Time: 12/28/18  4:01 PM  Result Value Ref Range   Color, Urine AMBER (A) YELLOW    Comment: BIOCHEMICALS MAY BE AFFECTED BY COLOR   APPearance HAZY (A) CLEAR   Specific Gravity, Urine 1.027 1.005 - 1.030   pH 5.0 5.0 - 8.0   Glucose, UA 50 (A) NEGATIVE mg/dL   Hgb urine dipstick NEGATIVE NEGATIVE   Bilirubin Urine NEGATIVE NEGATIVE   Ketones, ur 5 (A) NEGATIVE mg/dL   Protein, ur >=300 (A) NEGATIVE mg/dL   Nitrite NEGATIVE NEGATIVE   Leukocytes,Ua NEGATIVE NEGATIVE   RBC / HPF 0-5 0 - 5 RBC/hpf   WBC, UA 6-10 0 - 5 WBC/hpf   Bacteria, UA NONE SEEN NONE SEEN   Mucus PRESENT     Comment: Performed at [facility], [place] [address] [place], [place] [address]  CBC with Differential     Status: Abnormal   Collection Time: 12/28/18  4:01 PM  Result Value Ref Range   WBC 11.9 (H) 4.0 - 10.5 K/uL   RBC 5.08 4.22 - 5.81 MIL/uL   Hemoglobin 15.9 13.0 - 17.0 g/dL   HCT 47.9 39.0 - 52.0 %   MCV 94.3 80.0 - 100.0 fL   MCH 31.3 26.0 - 34.0 pg   MCHC 33.2 30.0 - 36.0 g/dL   RDW 12.9 11.5 - 15.5 %   Platelets 311 150 - 400 K/uL   nRBC 0.0 0.0 - 0.2 %   Neutrophils Relative % 76 %   Neutro Abs 9.1 (H) 1.7 - 7.7 K/uL   Lymphocytes Relative 16 %   Lymphs Abs 1.9 0.7 - 4.0 K/uL   Monocytes Relative 7 %   Monocytes Absolute 0.8 0.1 - 1.0 K/uL   Eosinophils Relative 1 %   Eosinophils Absolute 0.1 0.0 - 0.5 K/uL   Basophils Relative 0 %   Basophils Absolute 0.0 0.0 - 0.1 K/uL   Immature Granulocytes 0 %   Abs Immature Granulocytes 0.04 0.00 - 0.07 K/uL    Comment: Performed at [facility],  [place] [address] [place], [place] [address]  SARS CORONAVIRUS 2 (TAT  6-24 HRS) Nasopharyngeal Nasopharyngeal Swab     Status: None   Collection Time: 12/28/18  9:31 PM   Specimen: Nasopharyngeal Swab  Result Value Ref Range   SARS Coronavirus 2 NEGATIVE NEGATIVE    Comment: (NOTE) SARS-CoV-2 target nucleic acids are NOT DETECTED. The SARS-CoV-2 RNA is generally detectable in upper and lower respiratory specimens during the acute phase of infection. Negative results do not preclude SARS-CoV-2 infection, do not rule out co-infections with other pathogens, and should not be used as the sole basis for treatment or other patient management decisions. Negative results must be combined with clinical observations, patient history, and epidemiological information. The expected result is Negative. Fact Sheet for Patients: SugarRoll.be Fact Sheet for Healthcare Providers: https://www.woods-mathews.com/ This test is not yet approved or cleared by the [place] FDA and  has been authorized for detection and/or diagnosis of SARS-CoV-2 by FDA under an Emergency Use Authorization (EUA). This EUA will remain  in effect (meaning this test can be used) for the duration of the COVID-19 declaration under Section 56 4(b)(1) of the Act, 21 U.S.C. section 360bbb-3(b)(1), unless the authorization is terminated or revoked sooner. Performed at [facility], [place] [address] [place], [place] [address]   Magnesium - add on     Status: Abnormal   Collection Time: 12/29/18 12:59 AM  Result Value Ref Range   Magnesium 0.4 (LL) 1.7 - 2.4 mg/dL    Comment: CRITICAL RESULT CALLED TO, READ BACK BY AND VERIFIED WITH: T RYAN,RN 12/29/18 [id number] RHOLMES Performed at [facility], [place] [address] [place], [place] [address]   Prealbumin - add on     Status: Abnormal   Collection Time: 12/29/18 12:59 AM  Result Value Ref Range   Prealbumin 12.0 (L) 18  - 38 mg/dL    Comment: Performed at [facility], [place] [address], [place], [place] [id number]  Basic metabolic panel     Status: Abnormal   Collection Time: 12/29/18 12:59 AM  Result Value Ref Range   Sodium 141 135 - 145 mmol/L   Potassium 3.2 (L) 3.5 - 5.1 mmol/L   Chloride 105 98 - 111 mmol/L   CO2 23 22 - 32 mmol/L   Glucose, Bld 128 (H) 70 - 99 mg/dL   BUN 11 8 - 23 mg/dL   Creatinine, Ser 0.76 0.61 - 1.24 mg/dL   Calcium 8.1 (L) 8.9 - 10.3 mg/dL   GFR calc non Af Amer >60 >60 mL/min   GFR calc Af Amer >60 >60 mL/min   Anion gap 13 5 - 15    Comment: Performed at [facility], [place] [address], [place], [place] [address]  CBC     Status: Abnormal   Collection Time: 12/29/18 12:59 AM  Result Value Ref Range   WBC 11.7 (H) 4.0 - 10.5 K/uL   RBC 4.35 4.22 - 5.81 MIL/uL   Hemoglobin 13.7 13.0 - 17.0 g/dL   HCT 41.8 39.0 - 52.0 %   MCV 96.1 80.0 - 100.0 fL   MCH 31.5 26.0 - 34.0 pg   MCHC 32.8 30.0 - 36.0 g/dL   RDW 13.0 11.5 - 15.5 %   Platelets 217 150 - 400 K/uL   nRBC 0.0 0.0 - 0.2 %    Comment: Performed at [facility], [place] [address], [place], [place] [address]  Hemoglobin A1c     Status: Abnormal   Collection Time: 12/29/18 12:59 AM  Result Value Ref Range  Hgb A1c MFr Bld 6.4 (H) 4.8 - 5.6 %    Comment: (NOTE) Pre diabetes:          5.7%-6.4% Diabetes:              >6.4% Glycemic control for   <7.0% adults with diabetes    Mean Plasma Glucose 136.98 mg/dL    Comment: Performed at [facility], [place] [address] [place], [place] [address]  Comprehensive metabolic panel     Status: Abnormal   Collection Time: 12/29/18  5:31 AM  Result Value Ref Range   Sodium 143 135 - 145 mmol/L   Potassium 3.4 (L) 3.5 - 5.1 mmol/L   Chloride 107 98 - 111 mmol/L   CO2 23 22 - 32 mmol/L   Glucose, Bld 103 (H) 70 - 99 mg/dL   BUN 11 8 - 23 mg/dL   Creatinine, Ser 0.67 0.61 - 1.24 mg/dL   Calcium 8.0 (L) 8.9 - 10.3  mg/dL   Total Protein 5.4 (L) 6.5 - 8.1 g/dL   Albumin 3.0 (L) 3.5 - 5.0 g/dL   AST 17 15 - 41 U/L   ALT 13 0 - 44 U/L   Alkaline Phosphatase 63 38 - 126 U/L   Total Bilirubin 0.8 0.3 - 1.2 mg/dL   GFR calc non Af Amer >60 >60 mL/min   GFR calc Af Amer >60 >60 mL/min   Anion gap 13 5 - 15    Comment: Performed at [facility], [place] [address], [place], [place] [address]  CBC with Differential/Platelet     Status: Abnormal   Collection Time: 12/29/18  5:31 AM  Result Value Ref Range   WBC 9.9 4.0 - 10.5 K/uL   RBC 4.17 (L) 4.22 - 5.81 MIL/uL   Hemoglobin 13.0 13.0 - 17.0 g/dL   HCT 39.7 39.0 - 52.0 %   MCV 95.2 80.0 - 100.0 fL   MCH 31.2 26.0 - 34.0 pg   MCHC 32.7 30.0 - 36.0 g/dL   RDW 12.9 11.5 - 15.5 %   Platelets 218 150 - 400 K/uL   nRBC 0.0 0.0 - 0.2 %   Neutrophils Relative % 79 %   Neutro Abs 7.7 1.7 - 7.7 K/uL   Lymphocytes Relative 15 %   Lymphs Abs 1.5 0.7 - 4.0 K/uL   Monocytes Relative 6 %   Monocytes Absolute 0.6 0.1 - 1.0 K/uL   Eosinophils Relative 0 %   Eosinophils Absolute 0.0 0.0 - 0.5 K/uL   Basophils Relative 0 %   Basophils Absolute 0.0 0.0 - 0.1 K/uL   Immature Granulocytes 0 %   Abs Immature Granulocytes 0.03 0.00 - 0.07 K/uL    Comment: Performed at [facility], [place] [address], [place], [place] [address]  Glucose, capillary     Status: None   Collection Time: 12/29/18  7:28 AM  Result Value Ref Range   Glucose-Capillary 83 70 - 99 mg/dL  Glucose, capillary     Status: Abnormal   Collection Time: 12/29/18 11:31 AM  Result Value Ref Range   Glucose-Capillary 131 (H) 70 - 99 mg/dL  Glucose, capillary     Status: None   Collection Time: 12/29/18  4:38 PM  Result Value Ref Range   Glucose-Capillary 82 70 - 99 mg/dL  Glucose, capillary     Status: Abnormal   Collection Time: 12/29/18  8:42 PM  Result Value Ref Range   Glucose-Capillary 108 (H) 70 - 99 mg/dL  Glucose, capillary     Status: None   Collection  Time: 12/29/18 11:52 PM  Result Value Ref Range   Glucose-Capillary 88 70 - 99 mg/dL  Glucose, capillary     Status: None   Collection Time: 12/30/18  4:15 AM  Result Value Ref Range   Glucose-Capillary 94 70 - 99 mg/dL  Magnesium QAM x 5d     Status: Abnormal   Collection Time: 12/30/18  5:19 AM  Result Value Ref Range   Magnesium 1.6 (L) 1.7 - 2.4 mg/dL    Comment: Performed at [facility], [place] [address] [place], [place] [address]  CBC in AM     Status: Abnormal   Collection Time: 12/30/18  5:19 AM  Result Value Ref Range   WBC 7.5 4.0 - 10.5 K/uL   RBC 3.81 (L) 4.22 - 5.81 MIL/uL   Hemoglobin 12.0 (L) 13.0 - 17.0 g/dL   HCT 37.0 (L) 39.0 - 52.0 %   MCV 97.1 80.0 - 100.0 fL   MCH 31.5 26.0 - 34.0 pg   MCHC 32.4 30.0 - 36.0 g/dL   RDW 13.1 11.5 - 15.5 %   Platelets 209 150 - 400 K/uL   nRBC 0.0 0.0 - 0.2 %    Comment: Performed at [facility], [place] [address] [place], [place] [id number]  Basic metabolic panel once in am     Status: Abnormal   Collection Time: 12/30/18  5:19 AM  Result Value Ref Range   Sodium 142 135 - 145 mmol/L   Potassium 3.7 3.5 - 5.1 mmol/L   Chloride 110 98 - 111 mmol/L   CO2 24 22 - 32 mmol/L   Glucose, Bld 95 70 - 99 mg/dL   BUN 6 (L) 8 - 23 mg/dL   Creatinine, Ser 0.60 (L) 0.61 - 1.24 mg/dL   Calcium 8.0 (L) 8.9 - 10.3 mg/dL   GFR calc non Af Amer >60 >60 mL/min   GFR calc Af Amer >60 >60 mL/min   Anion gap 8 5 - 15    Comment: Performed at [facility], [place] [address], [place], [place] [address]  Glucose, capillary     Status: None   Collection Time: 12/30/18  7:48 AM  Result Value Ref Range   Glucose-Capillary 90 70 - 99 mg/dL    Imaging / Studies: Ct Abdomen Pelvis W Contrast  Result Date: 12/28/2018 CLINICAL DATA:  Diverticulitis EXAM: CT ABDOMEN AND PELVIS WITH CONTRAST TECHNIQUE: Multidetector CT imaging of the abdomen and pelvis was performed using the standard protocol  following bolus administration of intravenous contrast. CONTRAST:  154mL OMNIPAQUE IOHEXOL 300 MG/ML  SOLN COMPARISON:  November 03, 2018 FINDINGS: Lower chest: The visualized heart size within normal limits. No pericardial fluid/thickening. No hiatal hernia. The visualized portions of the lungs are clear. Hepatobiliary: The liver is normal in density without focal abnormality.The main portal vein is patent. No evidence of calcified gallstones, gallbladder wall thickening or biliary dilatation. Pancreas: Unremarkable. No pancreatic ductal dilatation or surrounding inflammatory changes. Spleen: Normal in size without focal abnormality. Adrenals/Urinary Tract: Both adrenal glands appear normal. Tiny low-density lesions are seen within both kidneys as on the prior exam. Stomach/Bowel: The stomach and small bowel are normal in appearance. Again noted is a focal segment of distal sigmoid colon diverticulitis with diffuse bowel wall thickening. Tiny foci of free air are again noted superior to area at with non loculated fluid. There likely increased appearance of the non loculated  collection since the prior exam the appendix is unremarkable. Vascular/Lymphatic: There are no enlarged mesenteric, retroperitoneal, or pelvic lymph nodes. Scattered aortic atherosclerotic calcifications are seen without aneurysmal dilatation. Again noted is a aorto bi femoral stent graft. Reproductive: The prostate is heterogeneous in appearance. Other: No evidence of abdominal wall mass or hernia. Musculoskeletal: No acute or significant osseous findings. Prior posterior lumbar spine fixation is seen. IMPRESSION: Again noted is distal sigmoid diverticulitis with adjacent micro perforation non loculated fluid which could represent phlegmon. The non-loculated collection appears slightly more prominent than the prior exam. Electronically Signed   By: Prudencio Pair M.D.   On: 12/28/2018 19:51    Medications / Allergies: per chart   Antibiotics: Anti-infectives (From admission, onward)   Start     Dose/Rate Route Frequency Ordered Stop   12/29/18 2200  anidulafungin (ERAXIS) 100 mg in sodium chloride 0.9 % 100 mL IVPB     100 mg 78 mL/hr over 100 Minutes Intravenous Every 24 hours 12/28/18 2119     12/29/18 1400  neomycin (MYCIFRADIN) tablet 1,000 mg  Status:  Discontinued     1,000 mg Oral 3 times per day 12/28/18 2340 12/29/18 0217   12/29/18 1400  metroNIDAZOLE (FLAGYL) tablet 1,000 mg  Status:  Discontinued     1,000 mg Oral 3 times per day 12/28/18 2340 12/29/18 0217   12/29/18 0600  cefoTEtan (CEFOTAN) 2 g in sodium chloride 0.9 % 100 mL IVPB  Status:  Discontinued     2 g 200 mL/hr over 30 Minutes Intravenous On call to O.R. 12/28/18 2340 12/29/18 0217   12/28/18 2345  clindamycin (CLEOCIN) 900 mg, gentamicin (GARAMYCIN) 240 mg in sodium chloride 0.9 % 1,000 mL for intraperitoneal lavage  Status:  Discontinued      Irrigation To Surgery 12/28/18 2340 12/29/18 0217   12/28/18 2200  meropenem (MERREM) 1 g in sodium chloride 0.9 % 100 mL IVPB     1 g 200 mL/hr over 30 Minutes Intravenous Every 8 hours 12/28/18 2042     12/28/18 2130  anidulafungin (ERAXIS) 200 mg in sodium chloride 0.9 % 200 mL IVPB     200 mg 78 mL/hr over 200 Minutes Intravenous  Once 12/28/18 2119 12/29/18 0229   12/28/18 2045  piperacillin-tazobactam (ZOSYN) IVPB 3.375 g  Status:  Discontinued     3.375 g 12.5 mL/hr over 240 Minutes Intravenous Every 8 hours 12/28/18 2040 12/28/18 2042   12/28/18 2045  anidulafungin (ERAXIS) 100 mg in sodium chloride 0.9 % 100 mL IVPB  Status:  Discontinued     100 mg 78 mL/hr over 100 Minutes Intravenous Every 24 hours 12/28/18 2042 12/28/18 2118   12/28/18 2030  meropenem (MERREM) 1 g in sodium chloride 0.9 % 100 mL IVPB  Status:  Discontinued     1 g 200 mL/hr over 30 Minutes Intravenous  Once 12/28/18 2016 12/28/18 2040   12/28/18 2015  ertapenem (INVANZ) 1,000 mg in sodium chloride 0.9 % 100 mL IVPB   Status:  Discontinued     1 g 200 mL/hr over 30 Minutes Intravenous  Once 12/28/18 2001 12/28/18 2016        Note: Portions of this report may have been transcribed using voice recognition software. Every effort was made to ensure accuracy; however, inadvertent computerized transcription errors may be present.   Any transcriptional errors that result from this process are unintentional.     [name], MD, FACS, MASCRS Gastrointestinal and Minimally Invasive Surgery  [address], [place], [place] [address] [phone] Main / Paging [phone] Fax

## 2018-12-31 ENCOUNTER — Inpatient Hospital Stay: Payer: Self-pay

## 2018-12-31 LAB — BASIC METABOLIC PANEL
Anion gap: 7 (ref 5–15)
BUN: 6 mg/dL — ABNORMAL LOW (ref 8–23)
CO2: 23 mmol/L (ref 22–32)
Calcium: 8.7 mg/dL — ABNORMAL LOW (ref 8.9–10.3)
Chloride: 110 mmol/L (ref 98–111)
Creatinine, Ser: 0.66 mg/dL (ref 0.61–1.24)
GFR calc Af Amer: >60 mL/min (ref >60)
GFR calc non Af Amer: >60 mL/min (ref >60)
Glucose, Bld: 103 mg/dL — ABNORMAL HIGH (ref 70–99)
Potassium: 4.2 mmol/L (ref 3.5–5.1)
Sodium: 140 mmol/L (ref 135–145)

## 2018-12-31 LAB — CBC
HCT: 38.8 % — ABNORMAL LOW (ref 39.0–52.0)
Hemoglobin: 12.6 g/dL — ABNORMAL LOW (ref 13.0–17.0)
MCH: 31.3 pg (ref 26.0–34.0)
MCHC: 32.5 g/dL (ref 30.0–36.0)
MCV: 96.5 fL (ref 80.0–100.0)
Platelets: 228 10*3/uL (ref 150–400)
RBC: 4.02 MIL/uL — ABNORMAL LOW (ref 4.22–5.81)
RDW: 12.9 % (ref 11.5–15.5)
WBC: 6.7 10*3/uL (ref 4.0–10.5)
nRBC: 0 % (ref 0.0–0.2)

## 2018-12-31 LAB — MAGNESIUM: Magnesium: 1.7 mg/dL (ref 1.7–2.4)

## 2018-12-31 LAB — GLUCOSE, CAPILLARY
Glucose-Capillary: 95 mg/dL (ref 70–99)
Glucose-Capillary: 94 mg/dL (ref 70–99)
Glucose-Capillary: 156 mg/dL — ABNORMAL HIGH (ref 70–99)
Glucose-Capillary: 134 mg/dL — ABNORMAL HIGH (ref 70–99)
Glucose-Capillary: 139 mg/dL — ABNORMAL HIGH (ref 70–99)

## 2018-12-31 NOTE — Progress Notes (Signed)
Ray Brown [id number] 01-11-1951  CARE TEAM:  PCP: [name], MD  Outpatient Care Team: Patient Care Team: [name], MD as PCP - General (Family Medicine) [name], MD as PCP - Cardiology (Cardiology) Early, [name], MD as Consulting Physician (Vascular Surgery) [name], MD as Consulting Physician (Orthopedic Surgery) [name], MD as Consulting Physician (Cardiology) [name], MD as Consulting Physician (Colon and Rectal Surgery) [name], MD as Consulting Physician (Gastroenterology)  Inpatient Treatment Team: Treatment Team: Attending Provider: [name], MD; Consulting Physician: [name], Md, MD; Rounding Team: [name], MD; Technician: [name], NT; Registered Nurse: Tinnie Gens, RN; Consulting Physician: [name], MD; Utilization Review: Barbara Cower, RN   Problem List:   Principal Problem:   Diverticulitis of colon with perforation Active Problems:   Coronary artery disease involving native coronary artery of native heart without angina pectoris   Coronary stent 2008  restenosis due to progression of disease s/p restent 2010   Peripheral vascular disease ([place])   Obesity   Diabetes mellitus with peripheral artery disease ([place])   Dyslipidemia   OSA (obstructive sleep apnea)   BPPV (benign paroxysmal positional vertigo)   Chronic anticoagulation   Status post aortobifemoral bypass surgery 2013   Left-sided low back pain with left-sided sciatica   Nausea & vomiting   Hypokalemia   Hypomagnesemia   Protein-calorie malnutrition, moderate ([place])      * No surgery found *      Assessment  Improved  Plan:  ADAT Soft diet.  If has cramping or nausea, reduced down to blenderized/dysphagia 1 oral liquid diet as tolerated.  MiraLAX twice daily to avoid constipation.  Presume he is getting some partial colonic obstruction for his chronic diverticulitis."  He is turning around.  IV antibiotics.   Refractory to piperacillin/tazobactam and ceftriaxone.  Stabilized and improving with meropenem/Eraxis.    Place PICC line.  Plan IV meropenem x2 weeks home IV therapy.  If not improved repeat CT scan in 4-5 days to rule out abscess formation or further progression.  If deteriorates rapidly, will require Hartmann resection.  Seems less likely now.  If continues to improve, most likely discharge tomorrow with IV antibiotics until surgery.  Continue plan of colonoscopy and robotic colectomy 23-24 November  Hypomagnesemia.  Improved from severe state.  Give more today IV  Hypertension, diabetes, hyperlipidemia per primary service.  Sleep apnea per primary service.  Hold anticoagulation with Plavix.  Ideally would await 5 days from holding before proceeding with surgery.  He is not in severe shock requiring emergent surgery at this time  This is been simmering for much of this year.  Hopefully can stabilize and turn around and still plan resection later this month when the hopes of the inflammation will calm down more and he had a chance to improve his nutritional status.  Dwindling into moderate malnutrition.  Try supplemental shakes.    -VTE prophylaxis- SCDs, etc  -mobilize as tolerated to help recovery  12/31/2018    Subjective: (Chief complaint)  Denies any nausea or vomiting.  Tolerated thicker liquids  Hoping to go home soon..  Objective:  Vital signs:  Vitals:   12/30/18 0412 12/30/18 1410 12/30/18 1958 12/31/18 0405  BP: 117/62 121/74 116/72 120/68  Pulse: 66 66 63 63  Resp: 18 20 18 18   Temp: 98.1 F (36.7 C) 98.4 F (36.9 C) 99 F (37.2 C) 98.8 F (37.1 C)  TempSrc: Oral  Oral Oral  SpO2:  94% 97% 97% 96%  Weight:      Height:        Last BM Date: 12/29/18  Intake/Output   Yesterday:  11/08 0701 - 11/09 0700 In: 1480 [P.O.:1480] Out: 1250 [Urine:1250] This shift:  No intake/output data recorded.  Bowel function:  Flatus: YES  BM:   YES  Drain: (No drain)   Physical Exam:  General: Pt awake/alert/oriented x4 in no acute distress.  Relaxed.  Reading paper.  Not toxic.  Not sickly. Eyes: PERRL, normal EOM.  Sclera clear.  No icterus Neuro: CN II-XII intact w/o focal sensory/motor deficits. Lymph: No head/neck/groin lymphadenopathy Psych:  No delerium/psychosis/paranoia HENT: Normocephalic, Mucus membranes moist.  No thrush Neck: Supple, No tracheal deviation Chest: No chest wall pain w good excursion CV:  Pulses intact.  Regular rhythm MS: Normal AROM mjr joints.  No obvious deformity  Abdomen: Soft.  Nondistended.  Tenderness at Left lower quadrant only -very mild.  Improved..  No evidence of peritonitis.  No incarcerated hernias.  Ext:  No deformity.  No mjr edema.  No cyanosis Skin: No petechiae / purpura  Results:   Cultures: Recent Results (from the past 720 hour(s))  SARS CORONAVIRUS 2 (TAT 6-24 HRS) Nasopharyngeal Nasopharyngeal Swab     Status: None   Collection Time: 12/28/18  9:31 PM   Specimen: Nasopharyngeal Swab  Result Value Ref Range Status   SARS Coronavirus 2 NEGATIVE NEGATIVE Final    Comment: (NOTE) SARS-CoV-2 target nucleic acids are NOT DETECTED. The SARS-CoV-2 RNA is generally detectable in upper and lower respiratory specimens during the acute phase of infection. Negative results do not preclude SARS-CoV-2 infection, do not rule out co-infections with other pathogens, and should not be used as the sole basis for treatment or other patient management decisions. Negative results must be combined with clinical observations, patient history, and epidemiological information. The expected result is Negative. Fact Sheet for Patients: SugarRoll.be Fact Sheet for Healthcare Providers: https://www.woods-mathews.com/ This test is not yet approved or cleared by the [place] FDA and  has been authorized for detection and/or diagnosis of SARS-CoV-2  by FDA under an Emergency Use Authorization (EUA). This EUA will remain  in effect (meaning this test can be used) for the duration of the COVID-19 declaration under Section 56 4(b)(1) of the Act, 21 U.S.C. section 360bbb-3(b)(1), unless the authorization is terminated or revoked sooner. Performed at [facility], [place] [address], [place], [place] [address]     Labs: Results for orders placed or performed during the hospital encounter of 12/28/18 (from the past 48 hour(s))  Glucose, capillary     Status: Abnormal   Collection Time: 12/29/18 11:31 AM  Result Value Ref Range   Glucose-Capillary 131 (H) 70 - 99 mg/dL  Glucose, capillary     Status: None   Collection Time: 12/29/18  4:38 PM  Result Value Ref Range   Glucose-Capillary 82 70 - 99 mg/dL  Glucose, capillary     Status: Abnormal   Collection Time: 12/29/18  8:42 PM  Result Value Ref Range   Glucose-Capillary 108 (H) 70 - 99 mg/dL  Glucose, capillary     Status: None   Collection Time: 12/29/18 11:52 PM  Result Value Ref Range   Glucose-Capillary 88 70 - 99 mg/dL  Glucose, capillary     Status: None   Collection Time: 12/30/18  4:15 AM  Result Value Ref Range   Glucose-Capillary 94 70 - 99 mg/dL  Magnesium QAM x 5d  Status: Abnormal   Collection Time: 12/30/18  5:19 AM  Result Value Ref Range   Magnesium 1.6 (L) 1.7 - 2.4 mg/dL    Comment: Performed at [facility], [place] [address], [place], [place] [address]  CBC in AM     Status: Abnormal   Collection Time: 12/30/18  5:19 AM  Result Value Ref Range   WBC 7.5 4.0 - 10.5 K/uL   RBC 3.81 (L) 4.22 - 5.81 MIL/uL   Hemoglobin 12.0 (L) 13.0 - 17.0 g/dL   HCT 37.0 (L) 39.0 - 52.0 %   MCV 97.1 80.0 - 100.0 fL   MCH 31.5 26.0 - 34.0 pg   MCHC 32.4 30.0 - 36.0 g/dL   RDW 13.1 11.5 - 15.5 %   Platelets 209 150 - 400 K/uL   nRBC 0.0 0.0 - 0.2 %    Comment: Performed at [facility], [place] [address], [place], [place] [id number]   Basic metabolic panel once in am     Status: Abnormal   Collection Time: 12/30/18  5:19 AM  Result Value Ref Range   Sodium 142 135 - 145 mmol/L   Potassium 3.7 3.5 - 5.1 mmol/L   Chloride 110 98 - 111 mmol/L   CO2 24 22 - 32 mmol/L   Glucose, Bld 95 70 - 99 mg/dL   BUN 6 (L) 8 - 23 mg/dL   Creatinine, Ser 0.60 (L) 0.61 - 1.24 mg/dL   Calcium 8.0 (L) 8.9 - 10.3 mg/dL   GFR calc non Af Amer >60 >60 mL/min   GFR calc Af Amer >60 >60 mL/min   Anion gap 8 5 - 15    Comment: Performed at [facility], [place] [address] [place], [place] [address]  Glucose, capillary     Status: None   Collection Time: 12/30/18  7:48 AM  Result Value Ref Range   Glucose-Capillary 90 70 - 99 mg/dL  Glucose, capillary     Status: Abnormal   Collection Time: 12/30/18 11:13 AM  Result Value Ref Range   Glucose-Capillary 113 (H) 70 - 99 mg/dL  Glucose, capillary     Status: None   Collection Time: 12/30/18  4:20 PM  Result Value Ref Range   Glucose-Capillary 88 70 - 99 mg/dL  Glucose, capillary     Status: Abnormal   Collection Time: 12/30/18  8:01 PM  Result Value Ref Range   Glucose-Capillary 132 (H) 70 - 99 mg/dL  Glucose, capillary     Status: Abnormal   Collection Time: 12/30/18 11:47 PM  Result Value Ref Range   Glucose-Capillary 110 (H) 70 - 99 mg/dL  Glucose, capillary     Status: None   Collection Time: 12/31/18  4:08 AM  Result Value Ref Range   Glucose-Capillary 95 70 - 99 mg/dL  Magnesium QAM x 5d     Status: None   Collection Time: 12/31/18  5:45 AM  Result Value Ref Range   Magnesium 1.7 1.7 - 2.4 mg/dL    Comment: Performed at [facility], [place] [address], [place], [place] [id number]  Basic metabolic panel daily     Status: Abnormal   Collection Time: 12/31/18  5:45 AM  Result Value Ref Range   Sodium 140 135 - 145 mmol/L   Potassium 4.2 3.5 - 5.1 mmol/L   Chloride 110 98 - 111 mmol/L   CO2 23 22 - 32 mmol/L   Glucose, Bld 103 (H) 70 - 99  mg/dL   BUN 6 (L) 8 - 23 mg/dL   Creatinine, Ser 0.66 0.61 - 1.24 mg/dL   Calcium 8.7 (L) 8.9 - 10.3 mg/dL   GFR calc non Af Amer >60 >60 mL/min   GFR calc Af Amer >60 >60 mL/min   Anion gap 7 5 - 15    Comment: Performed at [facility], [place] [address] [place], [place] [address]  CBC daily once in am     Status: Abnormal   Collection Time: 12/31/18  5:45 AM  Result Value Ref Range   WBC 6.7 4.0 - 10.5 K/uL   RBC 4.02 (L) 4.22 - 5.81 MIL/uL   Hemoglobin 12.6 (L) 13.0 - 17.0 g/dL   HCT 38.8 (L) 39.0 - 52.0 %   MCV 96.5 80.0 - 100.0 fL   MCH 31.3 26.0 - 34.0 pg   MCHC 32.5 30.0 - 36.0 g/dL   RDW 12.9 11.5 - 15.5 %   Platelets 228 150 - 400 K/uL   nRBC 0.0 0.0 - 0.2 %    Comment: Performed at [facility], [place] [address] [place], [place] [address]  Glucose, capillary     Status: None   Collection Time: 12/31/18  7:48 AM  Result Value Ref Range   Glucose-Capillary 94 70 - 99 mg/dL    Imaging / Studies: [place] Ekg Site Rite  Result Date: 12/31/2018 If Site Rite image not attached, placement could not be confirmed due to current cardiac rhythm.   Medications / Allergies: per chart  Antibiotics: Anti-infectives (From admission, onward)   Start     Dose/Rate Route Frequency Ordered Stop   12/29/18 2200  anidulafungin (ERAXIS) 100 mg in sodium chloride 0.9 % 100 mL IVPB     100 mg 78 mL/hr over 100 Minutes Intravenous Every 24 hours 12/28/18 2119     12/29/18 1400  neomycin (MYCIFRADIN) tablet 1,000 mg  Status:  Discontinued     1,000 mg Oral 3 times per day 12/28/18 2340 12/29/18 0217   12/29/18 1400  metroNIDAZOLE (FLAGYL) tablet 1,000 mg  Status:  Discontinued     1,000 mg Oral 3 times per day 12/28/18 2340 12/29/18 0217   12/29/18 0600  cefoTEtan (CEFOTAN) 2 g in sodium chloride 0.9 % 100 mL IVPB  Status:  Discontinued     2 g 200 mL/hr over 30 Minutes Intravenous On call to O.R. 12/28/18 2340 12/29/18 0217   12/28/18 2345  clindamycin  (CLEOCIN) 900 mg, gentamicin (GARAMYCIN) 240 mg in sodium chloride 0.9 % 1,000 mL for intraperitoneal lavage  Status:  Discontinued      Irrigation To Surgery 12/28/18 2340 12/29/18 0217   12/28/18 2200  meropenem (MERREM) 1 g in sodium chloride 0.9 % 100 mL IVPB     1 g 200 mL/hr over 30 Minutes Intravenous Every 8 hours 12/28/18 2042     12/28/18 2130  anidulafungin (ERAXIS) 200 mg in sodium chloride 0.9 % 200 mL IVPB     200 mg 78 mL/hr over 200 Minutes Intravenous  Once 12/28/18 2119 12/29/18 0229   12/28/18 2045  piperacillin-tazobactam (ZOSYN) IVPB 3.375 g  Status:  Discontinued     3.375 g 12.5 mL/hr over 240 Minutes Intravenous Every 8 hours 12/28/18 2040 12/28/18 2042   12/28/18 2045  anidulafungin (ERAXIS) 100 mg in sodium chloride 0.9 % 100 mL IVPB  Status:  Discontinued     100 mg 78 mL/hr over 100 Minutes Intravenous Every 24 hours 12/28/18 2042 12/28/18  2118   12/28/18 2030  meropenem (MERREM) 1 g in sodium chloride 0.9 % 100 mL IVPB  Status:  Discontinued     1 g 200 mL/hr over 30 Minutes Intravenous  Once 12/28/18 2016 12/28/18 2040   12/28/18 2015  ertapenem (INVANZ) 1,000 mg in sodium chloride 0.9 % 100 mL IVPB  Status:  Discontinued     1 g 200 mL/hr over 30 Minutes Intravenous  Once 12/28/18 2001 12/28/18 2016        Note: Portions of this report may have been transcribed using voice recognition software. Every effort was made to ensure accuracy; however, inadvertent computerized transcription errors may be present.   Any transcriptional errors that result from this process are unintentional.     [name], MD, FACS, MASCRS Gastrointestinal and Minimally Invasive Surgery    [address] [place], [place] [address] [phone] Main / Paging [phone] Fax

## 2018-12-31 NOTE — TOC Initial Note (Signed)
Transition of Care [facility]) - Initial/Assessment Note    Patient Details  Name: Ray Brown MRN: [id number] Date of Birth: May 30, 1950  [facility] CM/SW Contact:    Trish Mage, LCSW Phone Number: 12/31/2018, 11:36 AM  Clinical Narrative:    [name] is here for Diverticulitis of colon with perforation.  Plan for [facility] Services and IV antibiotic therapy. Referral for IV antibiotics sent to [facility] with [facility]. Both she and [facility] worked with patient in the recent past, and will be glad to pick him up again. Orders have been placed for services. TOC will continue to follow for additional transition of care needs.               Expected Discharge Plan: [place] Barriers to Discharge: No Barriers Identified   Patient Goals and CMS Choice Patient states their goals for this hospitalization and ongoing recovery are:: "Get back home." CMS Medicare.gov Compare Post Acute Care list provided to:: Patient Choice offered to / list presented to : Patient  Expected Discharge Plan and Services Expected Discharge Plan: [place]   Discharge Planning Services: CM Consult Post Acute Care Choice: [place] arrangements for the past 2 months: Single Family Home                           HH Arranged: RN          Prior Living Arrangements/Services Living arrangements for the past 2 months: Single Family Home Lives with:: Spouse Patient language and need for interpreter reviewed:: Yes Do you feel safe going back to the place where you live?: Yes      Need for Family Participation in Patient Care: Yes (Comment) Care giver support system in place?: Yes (comment) Current home services: DME Criminal Activity/Legal Involvement Pertinent to Current Situation/Hospitalization: No - Comment as needed  Activities of Daily Living Home Assistive Devices/Equipment: None ADL Screening (condition at time of admission) Patient's  cognitive ability adequate to safely complete daily activities?: No Is the patient deaf or have difficulty hearing?: No Does the patient have difficulty seeing, even when wearing glasses/contacts?: No Does the patient have difficulty concentrating, remembering, or making decisions?: No Patient able to express need for assistance with ADLs?: Yes Does the patient have difficulty dressing or bathing?: No Independently performs ADLs?: Yes (appropriate for developmental age) Does the patient have difficulty walking or climbing stairs?: No Weakness of Legs: None Weakness of Arms/Hands: None  Permission Sought/Granted                  Emotional Assessment Appearance:: Appears stated age Attitude/Demeanor/Rapport: Engaged Affect (typically observed): Appropriate Orientation: : Oriented to Self, Oriented to Place, Oriented to  Time, Oriented to Situation Alcohol / Substance Use: Not Applicable Psych Involvement: No (comment)  Admission diagnosis:  Diverticulitis of large intestine with perforation without bleeding [K57.20] Acute diverticulitis of intestine [K57.92] Patient Active Problem List   Diagnosis Date Noted  . Hypomagnesemia 12/29/2018  . Protein-calorie malnutrition, moderate ([place]) 12/29/2018  . Chronic anticoagulation 12/28/2018  . Coronary stent 2008  restenosis due to progression of disease s/p restent 2010 12/28/2018  . Status post aortobifemoral bypass surgery 2013 12/28/2018  . Left-sided low back pain with left-sided sciatica 12/28/2018  . Nausea & vomiting 12/28/2018  . Hypokalemia 12/28/2018  . Chronic back pain   . BPPV (benign paroxysmal positional vertigo) 11/10/2018  . Diverticulitis of colon  with perforation 11/03/2018  . Dyslipidemia 11/03/2018  . OSA (obstructive sleep apnea) 11/03/2018  . Pain of right shoulder joint on movement 06/20/2018  . Dyspnea 12/05/2014  . Dizziness 12/05/2014  . Diabetes mellitus with peripheral artery disease ([place]) 10/04/2013   . Coronary artery disease involving native coronary artery of native heart without angina pectoris 10/04/2013  . Impingement syndrome of right shoulder 03/28/2013  . Obesity 01/21/2013  . Peripheral vascular disease ([place])    PCP:  [name], MD Pharmacy:   [id number] - [place], [place] [id number] [place] [id number] Phone: [phone] Fax: [phone]  [place], [place] [facility] [address] [place] [address] [place] [address] Phone: [phone] Fax: [place], [place] [address] [place] [address] Phone: [phone] Fax: [phone]     Social Determinants of Health (SDOH) Interventions    Readmission Risk Interventions No flowsheet data found.

## 2018-12-31 NOTE — Progress Notes (Signed)
PHARMACY CONSULT NOTE FOR:  OUTPATIENT  PARENTERAL ANTIBIOTIC THERAPY (OPAT)  Indication: Intra-abdominal infection  Regimen: Meropenem 1gm q8h End date: 01/14/2019  IV antibiotic discharge orders are pended. To discharging provider:  please sign these orders via discharge navigator,  Select New Orders & click on the button choice - Manage This Unsigned Work.     Thank you for allowing pharmacy to be a part of this patient's care.  Minda Ditto PharmD 12/31/2018, 8:34 AM

## 2018-12-31 NOTE — Care Management Important Message (Signed)
Important Message  Patient Details IM Letter given to [facility] SW to present to the Patient Name: Ray Brown MRN: [id number] Date of Birth: February 28, 1950   Medicare Important Message Given:  Yes     Kerin Salen 12/31/2018, 10:59 AM

## 2018-12-31 NOTE — Progress Notes (Signed)
PROGRESS NOTE    RANGER MIDURA  [id number] DOB: 1951-01-24 DOA: 12/28/2018 PCP: [name], MD   Brief Narrative:   68 year old male with prior h/o CAD, type 2 DM, PVD, sleep apnea not on CPAP, started having bouts of diverticulitis from June 2020, another episode in September, presents with persistent nausea, vomiting , abdominal pain and diarrhea. On arrival ED CT abd and pelvis show sigmoid diverticulitis with microperforation and phlegmon and increasing fluid collection. Surgery consulted, recommended to continue with IV antibiotics, IV fluids, and a repeat CT abd and pelvis in 5 days to watch for improvement , if no improvement, plan for surgical resection.  Patient seen and examined today he continues to improve he denies any nausea vomiting or abdominal pain and surgery advance diet to soft diet.  PICC line was ordered for 2 weeks of IV antibiotics on discharge  Assessment & Plan:   Principal Problem:   Diverticulitis of colon with perforation Active Problems:   Peripheral vascular disease ([place])   Obesity   Diabetes mellitus with peripheral artery disease ([place])   Coronary artery disease involving native coronary artery of native heart without angina pectoris   Dyslipidemia   OSA (obstructive sleep apnea)   BPPV (benign paroxysmal positional vertigo)   Chronic anticoagulation   Coronary stent 2008  restenosis due to progression of disease s/p restent 2010   Status post aortobifemoral bypass surgery 2013   Left-sided low back pain with left-sided sciatica   Nausea & vomiting   Hypokalemia   Hypomagnesemia   Protein-calorie malnutrition, moderate (HCC)   Sigmoid Diverticulitis : Improving.  Patient denies any nausea vomiting abdominal pain and his diarrhea has improved.  He was started on clear liquid diet and advance to soft diet today.  Continue with IV Eraxis and meropenem and IV fluids for another 24 hours and transition to IV meropenem for 2 weeks as per surgery  recommendations on discharge.   Patient remains afebrile and his white count is within normal limits.  Hypokalemia: Replaced  Hypomagnesemia  Replaced  Mod protein calorie malnutrition;  Nutritionist on board.    CAD s/p PCI:  Hold  Plavix.  Patient denies any chest pain or shortness of breath at this time.   Mild anemia of chronic disease Gl[place]in stable around 12.  Continue to monitor   Diabetes Mellitus: Well controlled CBGs CBG (last 3)  Recent Labs    12/31/18 0408 12/31/18 0748 12/31/18 1120  GLUCAP 95 94 156*   Resume SSI.  A1c is 6.4.   Dyslipidemia Follow-up lipid panel as outpatient.  OSA: not on CPAP.    Moderate protein calorie malnutrition Nutrition consulted and supplementation added.  DVT prophylaxis: scd's Code Status: full code.  Family Communication: None at bedside Disposition Plan: Possible discharge home on IV antibiotics if he is able to  tolerate soft diet today  Consultants:   Surgery   Procedures: none.   Antimicrobials: IV meropenem and IV Eraxis.    Subjective: Denies any nausea vomiting abdominal pain.  Diarrhea has resolved.  Objective: Vitals:   12/30/18 1410 12/30/18 1958 12/31/18 0405 12/31/18 1344  BP: 121/74 116/72 120/68 122/84  Pulse: 66 63 63 70  Resp: 20 18 18 20   Temp: 98.4 F (36.9 C) 99 F (37.2 C) 98.8 F (37.1 C) 98.2 F (36.8 C)  TempSrc:  Oral Oral Oral  SpO2: 97% 97% 96% 97%  Weight:      Height:        Intake/Output Summary (  Last 24 hours) at 12/31/2018 1417 Last data filed at 12/31/2018 1344 Gross per 24 hour  Intake 1062 ml  Output 1100 ml  Net -38 ml   Filed Weights   12/28/18 1515  Weight: 78 kg    Examination:  General exam: Alert and comfortable, not in any distress  Respiratory system: Air to auscultation bilaterally no wheezing or rhonchi Cardiovascular system: S1 1 S2 heard, regular rate rhythm, no JVD Gastrointestinal system: Abdomen is soft, nontender, nondistended, bowel  sounds are normal  Central nervous system: Alert and oriented, grossly nonfocal Extremities: No pedal edema or cyanosis Skin: No rashes or lesions Psychiatry: Appropriate mood    Data Reviewed: I have personally reviewed following labs and imaging studies  CBC: Recent Labs  Lab 12/28/18 1601 12/29/18 0059 12/29/18 0531 12/30/18 0519 12/31/18 0545  WBC 11.9* 11.7* 9.9 7.5 6.7  NEUTROABS 9.1*  --  7.7  --   --   HGB 15.9 13.7 13.0 12.0* 12.6*  HCT 47.9 41.8 39.7 37.0* 38.8*  MCV 94.3 96.1 95.2 97.1 96.5  PLT 311 217 218 209 [id number]   Basic Metabolic Panel: Recent Labs  Lab 12/28/18 1601 12/29/18 0059 12/29/18 0531 12/30/18 0519 12/31/18 0545  NA 143 141 143 142 140  K 3.4* 3.2* 3.4* 3.7 4.2  CL 104 105 107 110 110  CO2 23 23 23 24 23   GLUCOSE 134* 128* 103* 95 103*  BUN 11 11 11  6* 6*  CREATININE 0.80 0.76 0.67 0.60* 0.66  CALCIUM 9.4 8.1* 8.0* 8.0* 8.7*  MG  --  0.4*  --  1.6* 1.7   GFR: Estimated Creatinine Clearance: 84.3 mL/min (by C-G formula based on SCr of 0.66 mg/dL). Liver Function Tests: Recent Labs  Lab 12/28/18 1601 12/29/18 0531  AST 26 17  ALT 16 13  ALKPHOS 88 63  BILITOT 1.0 0.8  PROT 7.4 5.4*  ALBUMIN 4.0 3.0*   Recent Labs  Lab 12/28/18 1601  LIPASE 25   No results for input(s): AMMONIA in the last 168 hours. Coagulation Profile: No results for input(s): INR, PROTIME in the last 168 hours. Cardiac Enzymes: No results for input(s): CKTOTAL, CKMB, CKMBINDEX, TROPONINI in the last 168 hours. BNP (last 3 results) No results for input(s): PROBNP in the last 8760 hours. HbA1C: Recent Labs    12/29/18 0059  HGBA1C 6.4*   CBG: Recent Labs  Lab 12/30/18 2001 12/30/18 2347 12/31/18 0408 12/31/18 0748 12/31/18 1120  GLUCAP 132* 110* 95 94 156*   Lipid Profile: No results for input(s): CHOL, HDL, LDLCALC, TRIG, CHOLHDL, LDLDIRECT in the last 72 hours. Thyroid Function Tests: No results for input(s): TSH, T4TOTAL, FREET4, T3FREE,  THYROIDAB in the last 72 hours. Anemia Panel: No results for input(s): VITAMINB12, FOLATE, FERRITIN, TIBC, IRON, RETICCTPCT in the last 72 hours. Sepsis Labs: No results for input(s): PROCALCITON, LATICACIDVEN in the last 168 hours.  Recent Results (from the past 240 hour(s))  SARS CORONAVIRUS 2 (TAT 6-24 HRS) Nasopharyngeal Nasopharyngeal Swab     Status: None   Collection Time: 12/28/18  9:31 PM   Specimen: Nasopharyngeal Swab  Result Value Ref Range Status   SARS Coronavirus 2 NEGATIVE NEGATIVE Final    Comment: (NOTE) SARS-CoV-2 target nucleic acids are NOT DETECTED. The SARS-CoV-2 RNA is generally detectable in upper and lower respiratory specimens during the acute phase of infection. Negative results do not preclude SARS-CoV-2 infection, do not rule out co-infections with other pathogens, and should not be used as the sole basis for  treatment or other patient management decisions. Negative results must be combined with clinical observations, patient history, and epidemiological information. The expected result is Negative. Fact Sheet for Patients: SugarRoll.be Fact Sheet for Healthcare Providers: https://www.woods-mathews.com/ This test is not yet approved or cleared by the [place] FDA and  has been authorized for detection and/or diagnosis of SARS-CoV-2 by FDA under an Emergency Use Authorization (EUA). This EUA will remain  in effect (meaning this test can be used) for the duration of the COVID-19 declaration under Section 56 4(b)(1) of the Act, 21 U.S.C. section 360bbb-3(b)(1), unless the authorization is terminated or revoked sooner. Performed at [facility], [place] [address], [place], [place] [address]          Radiology Studies: [place] Ekg Site Rite  Result Date: 12/31/2018 If [facility] image not attached, placement could not be confirmed due to current cardiac rhythm.       Scheduled Meds: . bisacodyl   10 mg Rectal Daily  . insulin aspart  0-9 Units Subcutaneous Q4H  . lip balm  1 application Topical BID  . pantoprazole  40 mg Oral QHS  . polyethylene glycol  17 g Oral BID  . saccharomyces boulardii  250 mg Oral BID   Continuous Infusions: . anidulafungin 100 mg (12/30/18 2149)  . lactated ringers    . meropenem (MERREM) IV 1 g (12/31/18 1305)  . methocarbamol (ROBAXIN) IV    . ondansetron (ZOFRAN) IV       LOS: 3 days       [name], MD [facility]  12/31/2018, 2:17 PM

## 2019-01-01 DIAGNOSIS — K5792 Diverticulitis of intestine, part unspecified, without perforation or abscess without bleeding: Secondary | ICD-10-CM

## 2019-01-01 LAB — BASIC METABOLIC PANEL
Anion gap: 8 (ref 5–15)
BUN: 6 mg/dL — ABNORMAL LOW (ref 8–23)
CO2: 23 mmol/L (ref 22–32)
Calcium: 9 mg/dL (ref 8.9–10.3)
Chloride: 107 mmol/L (ref 98–111)
Creatinine, Ser: 0.61 mg/dL (ref 0.61–1.24)
GFR calc Af Amer: >60 mL/min (ref >60)
GFR calc non Af Amer: >60 mL/min (ref >60)
Glucose, Bld: 120 mg/dL — ABNORMAL HIGH (ref 70–99)
Potassium: 4.4 mmol/L (ref 3.5–5.1)
Sodium: 138 mmol/L (ref 135–145)

## 2019-01-01 LAB — GLUCOSE, CAPILLARY
Glucose-Capillary: 109 mg/dL — ABNORMAL HIGH (ref 70–99)
Glucose-Capillary: 115 mg/dL — ABNORMAL HIGH (ref 70–99)
Glucose-Capillary: 156 mg/dL — ABNORMAL HIGH (ref 70–99)
Glucose-Capillary: 159 mg/dL — ABNORMAL HIGH (ref 70–99)

## 2019-01-01 LAB — MAGNESIUM: Magnesium: 1.5 mg/dL — ABNORMAL LOW (ref 1.7–2.4)

## 2019-01-01 MED ORDER — CHLORHEXIDINE GLUCONATE CLOTH 2 % EX PADS
6.0000 | MEDICATED_PAD | Freq: Every day | CUTANEOUS | Status: DC
  Administered 2019-01-01: 13:00:00 6 via TOPICAL

## 2019-01-01 MED ORDER — POLYETHYLENE GLYCOL 3350 17 G PO PACK: 17.0000 g | PACK | Freq: Two times a day (BID) | ORAL | 0 refills | Status: AC

## 2019-01-01 MED ORDER — MAGNESIUM OXIDE 400 (241.3 MG) MG PO TABS
400.0000 mg | ORAL_TABLET | Freq: Two times a day (BID) | ORAL | Status: DC
  Administered 2019-01-01: 400 mg via ORAL
  Filled 2019-01-01: qty 1

## 2019-01-01 MED ORDER — SODIUM CHLORIDE 0.9% FLUSH: 10.0000 mL | INTRAVENOUS | Status: DC | PRN

## 2019-01-01 MED ORDER — MAGNESIUM SULFATE 4 GM/100ML IV SOLN
4.0000 g | Freq: Once | INTRAVENOUS | Status: AC
  Administered 2019-01-01: 11:00:00 4 g via INTRAVENOUS
  Filled 2019-01-01 (×2): qty 100

## 2019-01-01 MED ORDER — MEROPENEM IV (FOR PTA / DISCHARGE USE ONLY): 1.0000 g | Freq: Three times a day (TID) | INTRAVENOUS | 0 refills | Status: AC

## 2019-01-01 MED ORDER — HEPARIN SOD (PORK) LOCK FLUSH 100 UNIT/ML IV SOLN
250.0000 [IU] | INTRAVENOUS | Status: AC | PRN
  Administered 2019-01-01: 15:00:00 250 [IU]
  Filled 2019-01-01: qty 2.5

## 2019-01-01 MED ORDER — MAGNESIUM OXIDE 400 (241.3 MG) MG PO TABS: 400.0000 mg | ORAL_TABLET | Freq: Every day | ORAL | 2 refills | Status: DC

## 2019-01-01 MED ORDER — HYDROCORTISONE (PERIANAL) 2.5 % EX CREA: 1.0000 "application " | TOPICAL_CREAM | Freq: Four times a day (QID) | CUTANEOUS | 0 refills | Status: DC | PRN

## 2019-01-01 NOTE — Progress Notes (Signed)
Peripherally Inserted Central Catheter/Midline Placement  The IV Nurse has discussed with the patient and/or persons authorized to consent for the patient, the purpose of this procedure and the potential benefits and risks involved with this procedure.  The benefits include less needle sticks, lab draws from the catheter, and the patient may be discharged home with the catheter. Risks include, but not limited to, infection, bleeding, blood clot (thrombus formation), and puncture of an artery; nerve damage and irregular heartbeat and possibility to perform a PICC exchange if needed/ordered by physician.  Alternatives to this procedure were also discussed.  Bard Power PICC patient education guide, fact sheet on infection prevention and patient information card has been provided to patient /or left at bedside.    PICC/Midline Placement Documentation  PICC Single Lumen [id number] PICC Right Basilic 37 cm 0 cm (Active)  Indication for Insertion or Continuance of Line Home intravenous therapies (PICC only) 01/01/19 1056  Exposed Catheter (cm) 0 cm 01/01/19 1056  Site Assessment Clean;Dry;Intact 01/01/19 1056  Line Status Flushed;Blood return noted;Saline locked 01/01/19 1056  Dressing Type Transparent 01/01/19 1056  Dressing Status Clean;Dry;Intact;Antimicrobial disc in place 01/01/19 1056  Dressing Change Due 01/08/19 01/01/19 1056     PICC Double Lumen [id number] PICC Right Basilic 38 cm 0 cm (Active)       Scotty Court 01/01/2019, 10:58 AM

## 2019-01-01 NOTE — H&P (View-Only) (Signed)
DEASHAWN BOWDER [id number] 08-13-1950  CARE TEAM:  PCP: [name], MD  Outpatient Care Team: Patient Care Team: [name], MD as PCP - General (Family Medicine) [name], MD as PCP - Cardiology (Cardiology) Early, [name], MD as Consulting Physician (Vascular Surgery) [name], MD as Consulting Physician (Orthopedic Surgery) [name], MD as Consulting Physician (Cardiology) [name], MD as Consulting Physician (Colon and Rectal Surgery) [name], MD as Consulting Physician (Gastroenterology)  Inpatient Treatment Team: Treatment Team: Attending Provider: [name], MD; Consulting Physician: [name], Md, MD; Rounding Team: [name], MD; Technician: [name], NT; Consulting Physician: [name], MD; Utilization Review: Darryll Capers, RN; Registered Nurse: Tinnie Gens, RN   Problem List:   Principal Problem:   Diverticulitis of colon with perforation Active Problems:   Coronary artery disease involving native coronary artery of native heart without angina pectoris   Coronary stent 2008  restenosis due to progression of disease s/p restent 2010   Peripheral vascular disease ([place])   Obesity   Diabetes mellitus with peripheral artery disease ([place])   Dyslipidemia   OSA (obstructive sleep apnea)   BPPV (benign paroxysmal positional vertigo)   Chronic anticoagulation   Status post aortobifemoral bypass surgery 2013   Left-sided low back pain with left-sided sciatica   Nausea & vomiting   Hypokalemia   Hypomagnesemia   Protein-calorie malnutrition, moderate ([place])      * No surgery found *      Assessment  Improved  Plan:   IV antibiotics.  Refractory to piperacillin/tazobactam and ceftriaxone.  Stabilized and improving with meropenem/Eraxis.    Place PICC line.  That was ordered yesterday but has not happened yet due to high volume of request.  Hopefully this morning.  Certainly before discharge.  Plan IV meropenem  x2 weeks home IV therapy. -That has been set up through advanced home care already.    Soft diet until surgery.  If has cramping or nausea, reduced down to blenderized/dysphagia 1 oral liquid diet as tolerated.  MiraLAX twice daily to avoid constipation.  Presume he is getting some partial colonic obstruction for his chronic diverticulitis."  He is turning around.   If not improved repeat CT scan in 4-5 days to rule out abscess formation or further progression.  If deteriorates rapidly, will require Hartmann resection.  Seems less likely now.  Continue plan of colonoscopy and robotic colectomy 23-24 November  Hypomagnesemia.  Improved from severe state.  Give more today IV.  Most likely discharge on oral magnesium.  With bowels working better, safe to restart  Hypertension, diabetes, hyperlipidemia per primary service.  Sleep apnea per primary service.  OK to restart Plavix since clinically improved and should be discharging today.  He is aware of holding it later this month 5 days before proceeding with surgery.  He is not in severe shock requiring emergent surgery at this time  This is been simmering for much of this year.  Hopefully can stabilize and turn around and still plan resection later this month when the hopes of the inflammation will calm down more and he had a chance to improve his nutritional status.  Dwindling into moderate malnutrition.  Try supplemental shakes.    -VTE prophylaxis- SCDs, etc  -mobilize as tolerated to help recovery  D/C patient from hospital when patient meets criteria (anticipate in today(s)):  -PICC line placement -Cleared by primary service   01/01/2019    Subjective: (Chief complaint)  Feeling much better overall.  Discomfort minimal.  Tolerated soft diet.  Hoping to go home today.  Objective:  Vital signs:  Vitals:   12/31/18 0405 12/31/18 1344 12/31/18 2057 01/01/19 0402  BP: 120/68 122/84 (!) 133/59 130/74  Pulse: 63  70 64 (!) 59  Resp: 18 20 16 16   Temp: 98.8 F (37.1 C) 98.2 F (36.8 C) 98.9 F (37.2 C) 98.5 F (36.9 C)  TempSrc: Oral Oral Oral Oral  SpO2: 96% 97% 98% 95%  Weight:      Height:        Last BM Date: 12/29/18  Intake/Output   Yesterday:  11/09 0701 - 11/10 0700 In: 1062 [P.O.:1062] Out: 700 [Urine:700] This shift:  No intake/output data recorded.  Bowel function:  Flatus: YES  BM:  YES  Drain: (No drain)   Physical Exam:  General: Pt awake/alert/oriented x4 in no acute distress.  Not toxic.  Not sickly. Eyes: PERRL, normal EOM.  Sclera clear.  No icterus Neuro: CN II-XII intact w/o focal sensory/motor deficits. Lymph: No head/neck/groin lymphadenopathy Psych:  No delerium/psychosis/paranoia HENT: Normocephalic, Mucus membranes moist.  No thrush Neck: Supple, No tracheal deviation Chest: No chest wall pain w good excursion CV:  Pulses intact.  Regular rhythm MS: Normal AROM mjr joints.  No obvious deformity  Abdomen: Soft.  Nondistended.  Nontender.  No evidence of peritonitis.  No incarcerated hernias.  Ext:  No deformity.  No mjr edema.  No cyanosis Skin: No petechiae / purpura  Results:   Cultures: Recent Results (from the past 720 hour(s))  SARS CORONAVIRUS 2 (TAT 6-24 HRS) Nasopharyngeal Nasopharyngeal Swab     Status: None   Collection Time: 12/28/18  9:31 PM   Specimen: Nasopharyngeal Swab  Result Value Ref Range Status   SARS Coronavirus 2 NEGATIVE NEGATIVE Final    Comment: (NOTE) SARS-CoV-2 target nucleic acids are NOT DETECTED. The SARS-CoV-2 RNA is generally detectable in upper and lower respiratory specimens during the acute phase of infection. Negative results do not preclude SARS-CoV-2 infection, do not rule out co-infections with other pathogens, and should not be used as the sole basis for treatment or other patient management decisions. Negative results must be combined with clinical observations, patient history, and  epidemiological information. The expected result is Negative. Fact Sheet for Patients: SugarRoll.be Fact Sheet for Healthcare Providers: https://www.woods-mathews.com/ This test is not yet approved or cleared by the [place] FDA and  has been authorized for detection and/or diagnosis of SARS-CoV-2 by FDA under an Emergency Use Authorization (EUA). This EUA will remain  in effect (meaning this test can be used) for the duration of the COVID-19 declaration under Section 56 4(b)(1) of the Act, 21 U.S.C. section 360bbb-3(b)(1), unless the authorization is terminated or revoked sooner. Performed at [facility], [place] [address], [place], [place] [address]     Labs: Results for orders placed or performed during the hospital encounter of 12/28/18 (from the past 48 hour(s))  Glucose, capillary     Status: Abnormal   Collection Time: 12/30/18 11:13 AM  Result Value Ref Range   Glucose-Capillary 113 (H) 70 - 99 mg/dL  Glucose, capillary     Status: None   Collection Time: 12/30/18  4:20 PM  Result Value Ref Range   Glucose-Capillary 88 70 - 99 mg/dL  Glucose, capillary     Status: Abnormal   Collection Time: 12/30/18  8:01 PM  Result Value Ref Range   Glucose-Capillary 132 (H) 70 - 99 mg/dL  Glucose, capillary  Status: Abnormal   Collection Time: 12/30/18 11:47 PM  Result Value Ref Range   Glucose-Capillary 110 (H) 70 - 99 mg/dL  Glucose, capillary     Status: None   Collection Time: 12/31/18  4:08 AM  Result Value Ref Range   Glucose-Capillary 95 70 - 99 mg/dL  Magnesium QAM x 5d     Status: None   Collection Time: 12/31/18  5:45 AM  Result Value Ref Range   Magnesium 1.7 1.7 - 2.4 mg/dL    Comment: Performed at [facility], [place] [address] [place], [place] [id number]  Basic metabolic panel daily     Status: Abnormal   Collection Time: 12/31/18  5:45 AM  Result Value Ref Range   Sodium 140 135 - 145 mmol/L    Potassium 4.2 3.5 - 5.1 mmol/L   Chloride 110 98 - 111 mmol/L   CO2 23 22 - 32 mmol/L   Glucose, Bld 103 (H) 70 - 99 mg/dL   BUN 6 (L) 8 - 23 mg/dL   Creatinine, Ser 0.66 0.61 - 1.24 mg/dL   Calcium 8.7 (L) 8.9 - 10.3 mg/dL   GFR calc non Af Amer >60 >60 mL/min   GFR calc Af Amer >60 >60 mL/min   Anion gap 7 5 - 15    Comment: Performed at [facility], [place] [address], [place], [place] [address]  CBC daily once in am     Status: Abnormal   Collection Time: 12/31/18  5:45 AM  Result Value Ref Range   WBC 6.7 4.0 - 10.5 K/uL   RBC 4.02 (L) 4.22 - 5.81 MIL/uL   Hemoglobin 12.6 (L) 13.0 - 17.0 g/dL   HCT 38.8 (L) 39.0 - 52.0 %   MCV 96.5 80.0 - 100.0 fL   MCH 31.3 26.0 - 34.0 pg   MCHC 32.5 30.0 - 36.0 g/dL   RDW 12.9 11.5 - 15.5 %   Platelets 228 150 - 400 K/uL   nRBC 0.0 0.0 - 0.2 %    Comment: Performed at [facility], [place] [address] [place], [place] [address]  Glucose, capillary     Status: None   Collection Time: 12/31/18  7:48 AM  Result Value Ref Range   Glucose-Capillary 94 70 - 99 mg/dL  Glucose, capillary     Status: Abnormal   Collection Time: 12/31/18 11:20 AM  Result Value Ref Range   Glucose-Capillary 156 (H) 70 - 99 mg/dL  Glucose, capillary     Status: Abnormal   Collection Time: 12/31/18  3:40 PM  Result Value Ref Range   Glucose-Capillary 134 (H) 70 - 99 mg/dL  Glucose, capillary     Status: Abnormal   Collection Time: 12/31/18  8:59 PM  Result Value Ref Range   Glucose-Capillary 139 (H) 70 - 99 mg/dL  Glucose, capillary     Status: Abnormal   Collection Time: 01/01/19 12:05 AM  Result Value Ref Range   Glucose-Capillary 159 (H) 70 - 99 mg/dL  Glucose, capillary     Status: Abnormal   Collection Time: 01/01/19  4:03 AM  Result Value Ref Range   Glucose-Capillary 115 (H) 70 - 99 mg/dL  Magnesium QAM x 5d     Status: Abnormal   Collection Time: 01/01/19  5:10 AM  Result Value Ref Range   Magnesium 1.5 (L) 1.7 -  2.4 mg/dL    Comment: Performed at [facility], [place] [address] [place],  [address]  Basic metabolic panel daily     Status: Abnormal   Collection Time: 01/01/19  5:10 AM  Result Value Ref Range   Sodium 138 135 - 145 mmol/L   Potassium 4.4 3.5 - 5.1 mmol/L   Chloride 107 98 - 111 mmol/L   CO2 23 22 - 32 mmol/L   Glucose, Bld 120 (H) 70 - 99 mg/dL   BUN 6 (L) 8 - 23 mg/dL   Creatinine, Ser 0.61 0.61 - 1.24 mg/dL   Calcium 9.0 8.9 - 10.3 mg/dL   GFR calc non Af Amer >60 >60 mL/min   GFR calc Af Amer >60 >60 mL/min   Anion gap 8 5 - 15    Comment: Performed at [facility], [place] [address] [place], [place] [address]  Glucose, capillary     Status: Abnormal   Collection Time: 01/01/19  8:01 AM  Result Value Ref Range   Glucose-Capillary 109 (H) 70 - 99 mg/dL    Imaging / Studies: [place] Ekg Site Rite  Result Date: 12/31/2018 If Site Rite image not attached, placement could not be confirmed due to current cardiac rhythm.   Medications / Allergies: per chart  Antibiotics: Anti-infectives (From admission, onward)   Start     Dose/Rate Route Frequency Ordered Stop   01/01/19 0000  meropenem (MERREM) IVPB     1 g Intravenous Every 8 hours 01/01/19 0759 01/15/19 2359   12/29/18 2200  anidulafungin (ERAXIS) 100 mg in sodium chloride 0.9 % 100 mL IVPB     100 mg 78 mL/hr over 100 Minutes Intravenous Every 24 hours 12/28/18 2119     12/29/18 1400  neomycin (MYCIFRADIN) tablet 1,000 mg  Status:  Discontinued     1,000 mg Oral 3 times per day 12/28/18 2340 12/29/18 0217   12/29/18 1400  metroNIDAZOLE (FLAGYL) tablet 1,000 mg  Status:  Discontinued     1,000 mg Oral 3 times per day 12/28/18 2340 12/29/18 0217   12/29/18 0600  cefoTEtan (CEFOTAN) 2 g in sodium chloride 0.9 % 100 mL IVPB  Status:  Discontinued     2 g 200 mL/hr over 30 Minutes Intravenous On call to O.R. 12/28/18 2340 12/29/18 0217   12/28/18 2345  clindamycin (CLEOCIN) 900 mg,  gentamicin (GARAMYCIN) 240 mg in sodium chloride 0.9 % 1,000 mL for intraperitoneal lavage  Status:  Discontinued      Irrigation To Surgery 12/28/18 2340 12/29/18 0217   12/28/18 2200  meropenem (MERREM) 1 g in sodium chloride 0.9 % 100 mL IVPB     1 g 200 mL/hr over 30 Minutes Intravenous Every 8 hours 12/28/18 2042     12/28/18 2130  anidulafungin (ERAXIS) 200 mg in sodium chloride 0.9 % 200 mL IVPB     200 mg 78 mL/hr over 200 Minutes Intravenous  Once 12/28/18 2119 12/29/18 0229   12/28/18 2045  piperacillin-tazobactam (ZOSYN) IVPB 3.375 g  Status:  Discontinued     3.375 g 12.5 mL/hr over 240 Minutes Intravenous Every 8 hours 12/28/18 2040 12/28/18 2042   12/28/18 2045  anidulafungin (ERAXIS) 100 mg in sodium chloride 0.9 % 100 mL IVPB  Status:  Discontinued     100 mg 78 mL/hr over 100 Minutes Intravenous Every 24 hours 12/28/18 2042 12/28/18 2118   12/28/18 2030  meropenem (MERREM) 1 g in sodium chloride 0.9 % 100 mL IVPB  Status:  Discontinued     1 g 200 mL/hr over 30 Minutes Intravenous  Once 12/28/18 2016 12/28/18  2040   12/28/18 2015  ertapenem (INVANZ) 1,000 mg in sodium chloride 0.9 % 100 mL IVPB  Status:  Discontinued     1 g 200 mL/hr over 30 Minutes Intravenous  Once 12/28/18 2001 12/28/18 2016        Note: Portions of this report may have been transcribed using voice recognition software. Every effort was made to ensure accuracy; however, inadvertent computerized transcription errors may be present.   Any transcriptional errors that result from this process are unintentional.     [name], MD, FACS, MASCRS Gastrointestinal and Minimally Invasive Surgery    [address], [place], [place] [address] [phone] Main / Paging [phone] Fax

## 2019-01-01 NOTE — Discharge Instructions (Signed)
EATING AFTER AN EPISODE OF NAUSEA AND VOMITING  WITH PARTIAL COLONIC OBSTRUCTION   EAT Gradually transition to a high fiber diet with a fiber supplement over the next few days after discharge  WALK Walk an hour a day.  Control your pain to do that.    CONTROL PAIN Control pain so that you can walk, sleep, tolerate sneezing/coughing, go up/down stairs.  HAVE A BOWEL MOVEMENT DAILY Keep your bowels regular to avoid problems.  OK to try a laxative to override constipation.  OK to use an antidairrheal to slow down diarrhea.  Call if not better after 2 tries  CALL IF YOU HAVE PROBLEMS/CONCERNS Call if you are still struggling despite following these instructions. Call if you have concerns not answered by these instructions     After your BOWEL OBSTRUCTION, expect some issues over the next few weeks.    To help you through this temporary phase, continue soft diet.    Your first meal in the hospital was thin liquids.  You should have been given a pureed diet by the time you left the hospital.  We ask patients to stay on a pureed diet for the first few days to avoid anything getting "stuck."  Don't be alarmed if your ability to swallow doesn't progress according to this plan.  Everyone is different and some diets can advance more or less quickly.     Some BASIC RULES to follow are:  Maintain an upright position whenever eating or drinking.  Take small bites - just a teaspoon size bite at a time.  Eat slowly.  It may also help to eat only one food at a time.  Consider nibbling through smaller, more frequent meals & avoid the urge to eat BIG meals  Do not push through feelings of fullness, nausea, or bloatedness  Do not mix solid foods and liquids in the same mouthful  Try not to "wash foods down" with large gulps of liquids.  Avoid carbonated (bubbly/fizzy) drinks.    Avoid foods that make you feel gassy or bloated.  Start with bland foods first.  Wait on trying greasy, fried,  or spicy meals until you are tolerating more bland solids well.  Expect to be more gassy/flatulent/bloated initially.  Walking will help your body manage it better.  Consider using medications for bloating that contain simethicone such as  Maalox or Gas-X   Eat in a relaxed atmosphere & minimize distractions.  Avoid talking while eating.    Do not use straws.  Following each meal, sit in an upright position (90 degree angle) for 60 to 90 minutes.  Going for a short walk can help as well  If food does stick, don't panic.  Try to relax and let the food pass on its own.  Sipping WARM LIQUID such as strong hot black tea can also help slide it down.   Be gradual in changes & use common sense:  -If you easily tolerating a certain "level" of foods, advance to the next level gradually -If you are having trouble swallowing a particular food, then avoid it.   -If food is sticking when you advance your diet, go back to thinner previous diet (the lower LEVEL) for 1-2 days.  LEVEL 1 = PUREED DIET  Do for the first [place] in this group are pureed or blenderized to a smooth, mashed potato-like consistency.  -If necessary, the pureed foods can keep their shape with the addition of a  thickening agent.   -Meat should be pureed to a smooth, pasty consistency.  Hot broth or gravy may be added to the pureed meat, approximately 1 oz. of liquid per 3 oz. serving of meat. -CAUTION:  If any foods do not puree into a smooth consistency, swallowing will be more difficult.  (For example, nuts or seeds sometimes do not blend well.)  Hot Foods Cold Foods  Pureed scrambled eggs and cheese Pureed cottage cheese  Baby cereals Thickened juices and nectars  Thinned cooked cereals (no lumps) Thickened milk or eggnog  Pureed [place] toast or pancakes Ensure  Mashed potatoes Ice cream  Pureed parsley, au gratin, scalloped potatoes, candied sweet potatoes Fruit or [place] ice,  sherbet  Pureed buttered or alfredo noodles Plain yogurt  Pureed vegetables (no corn or peas) Instant breakfast  Pureed soups and creamed soups Smooth pudding, mousse, custard  Pureed scalloped apples Whipped gelatin  Gravies Sugar, syrup, honey, jelly  Sauces, cheese, tomato, barbecue, white, creamed Cream  Any baby food Creamer  Alcohol in moderation (not beer or champagne) Margarine  Coffee or tea Mayonnaise   Ketchup, mustard   Apple sauce   SAMPLE MENU:  PUREED DIET Breakfast Lunch Dinner   Orange juice, 1/2 cup  Cream of wheat, 1/2 cup  Pineapple juice, 1/2 cup  Pureed [place], barley soup, 3/4 cup  Pureed Hawaiian chicken, 3 oz   Scrambled eggs, mashed or blended with cheese, 1/2 cup  Tea or coffee, 1 cup   Whole milk, 1 cup   Non-dairy creamer, 2 Tbsp.  Mashed potatoes, 1/2 cup  Pureed cooled broccoli, 1/2 cup  Apple sauce, 1/2 cup  Coffee or tea  Mashed potatoes, 1/2 cup  Pureed spinach, 1/2 cup  Frozen yogurt, 1/2 cup  Tea or coffee      LEVEL 2 = SOFT DIET  After your first few days, you can advance to a soft diet.   Keep on this diet until everything goes down easily.  Hot Foods Cold Foods  White fish Cottage cheese  Stuffed fish Junior baby fruit  Baby food meals Semi thickened juices  Minced soft cooked, scrambled, poached eggs nectars  Souffle & omelets Ripe mashed bananas  Cooked cereals Canned fruit, pineapple sauce, milk  potatoes Milkshake  Buttered or Alfredo noodles Custard  Cooked cooled vegetable Puddings, including tapioca  [place] Yogurt  Vegetable soup or alphabet soup Fruit ice, [place] ice  Gravies Whipped gelatin  Sugar, syrup, honey, jelly Junior baby desserts  Sauces:  Cheese, creamed, barbecue, tomato, white Cream  Coffee or tea Margarine   SAMPLE MENU:  LEVEL 2 Breakfast Lunch Dinner   Orange juice, 1/2 cup  Oatmeal, 1/2 cup  Scrambled eggs with cheese, 1/2 cup  Decaffeinated tea, 1 cup  Whole milk, 1  cup  Non-dairy creamer, 2 Tbsp  Pineapple juice, 1/2 cup  Minced beef, 3 oz  Gravy, 2 Tbsp  Mashed potatoes, 1/2 cup  Minced fresh broccoli, 1/2 cup  Applesauce, 1/2 cup  Coffee, 1 cup  [place], barley soup, 3/4 cup  Minced Hawaiian chicken, 3 oz  Mashed potatoes, 1/2 cup  Cooked spinach, 1/2 cup  Frozen yogurt, 1/2 cup  Non-dairy creamer, 2 Tbsp      LEVEL 3 = CHOPPED DIET  -After all the foods in level 2 (soft diet) are passing through well you should advance up to more chopped foods.  -It is still important to cut these foods into small pieces and eat slowly.  Hot Foods Cold Foods  Poultry Cottage cheese  Chopped Swedish meatballs Yogurt  Meat salads (ground or flaked meat) Milk  Flaked fish (tuna) Milkshakes  Poached or scrambled eggs Soft, cold, dry cereal  Souffles and omelets Fruit juices or nectars  Cooked cereals Chopped canned fruit  Chopped [place] toast or pancakes Canned fruit cocktail  Noodles or pasta (no rice) Pudding, mousse, custard  Cooked vegetables (no frozen peas, corn, or mixed vegetables) Green salad  Canned small sweet peas Ice cream  Creamed soup or vegetable soup Fruit ice, [place] ice  Pureed vegetable soup or alphabet soup Non-dairy creamer  Ground scalloped apples Margarine  Gravies Mayonnaise  Sauces:  Cheese, creamed, barbecue, tomato, white Ketchup  Coffee or tea Mustard   SAMPLE MENU:  LEVEL 3 Breakfast Lunch Dinner   Orange juice, 1/2 cup  Oatmeal, 1/2 cup  Scrambled eggs with cheese, 1/2 cup  Decaffeinated tea, 1 cup  Whole milk, 1 cup  Non-dairy creamer, 2 Tbsp  Ketchup, 1 Tbsp  Margarine, 1 tsp  Salt, 1/4 tsp  Sugar, 2 tsp  Pineapple juice, 1/2 cup  Ground beef, 3 oz  Gravy, 2 Tbsp  Mashed potatoes, 1/2 cup  Cooked spinach, 1/2 cup  Applesauce, 1/2 cup  Decaffeinated coffee  Whole milk  Non-dairy creamer, 2 Tbsp  Margarine, 1 tsp  Salt, 1/4 tsp  Pureed [place], barley soup, 3/4  cup  Barbecue chicken, 3 oz  Mashed potatoes, 1/2 cup  Ground fresh broccoli, 1/2 cup  Frozen yogurt, 1/2 cup  Decaffeinated tea, 1 cup  Non-dairy creamer, 2 Tbsp  Margarine, 1 tsp  Salt, 1/4 tsp  Sugar, 1 tsp    LEVEL 4:  REGULAR FOODS  -Foods in this group are soft, moist, regularly textured foods.   -This level includes meat and breads, which tend to be the hardest things to swallow.   -Eat very slowly, chew well and continue to avoid carbonated drinks. -most people are at this level in 2-4 weeks  Hot Foods Cold Foods  Baked fish or skinned Soft cheeses - cottage cheese  Souffles and omelets Cream cheese  Eggs Yogurt  Stuffed shells Milk  Spaghetti with meat sauce Milkshakes  Cooked cereal Cold dry cereals (no nuts, dried fruit, coconut)  [place] toast or pancakes Crackers  Buttered toast Fruit juices or nectars  Noodles or pasta (no rice) Canned fruit  Potatoes (all types) Ripe bananas  Soft, cooked vegetables (no corn, lima, or baked beans) Peeled, ripe, fresh fruit  Creamed soups or vegetable soup Cakes (no nuts, dried fruit, coconut)  Canned chicken noodle soup Plain doughnuts  Gravies Ice cream  Bacon dressing Pudding, mousse, custard  Sauces:  Cheese, creamed, barbecue, tomato, white Fruit ice, [place] ice, sherbet  Decaffeinated tea or coffee Whipped gelatin  Pork chops Regular gelatin   Canned fruited gelatin molds   Sugar, syrup, honey, jam, jelly   Cream   Non-dairy   Margarine   Oil   Mayonnaise   Ketchup   Mustard   TROUBLESHOOTING IRREGULAR BOWELS  1) Avoid extremes of bowel movements (no bad constipation/diarrhea)  2) Miralax 17gm mixed in 8oz. water or juice-daily. May use BID as needed.  3) Gas-x,Phazyme, etc. as needed for gas & bloating.  4) Soft,bland diet. No spicy,greasy,fried foods.  5) Prilosec over-the-counter as needed  6) May hold gluten/wheat products from diet to see if symptoms improve.  7) May try probiotics (Align,  Activa, etc) to help calm the bowels down  7) If symptoms become worse call back immediately.  If you have any questions please call our office at [place]: [phone].   Small Bowel Obstruction A small bowel obstruction is a blockage in the small bowel. The small bowel, which is also called the small intestine, is a long, slender tube that connects the stomach to the colon. When a person eats and drinks, food and fluids go from the stomach to the small bowel. This is where most of the nutrients in the food and fluids are absorbed. A small bowel obstruction will prevent food and fluids from passing through the small bowel as they normally do during digestion. The small bowel can become partially or completely blocked. This can cause symptoms such as abdominal pain, vomiting, and bloating. If this condition is not treated, it can be dangerous because the small bowel could rupture. CAUSES Common causes of this condition include: 2. Scar tissue from previous surgery or radiation treatment. 3. Recent surgery. This may cause the movements of the bowel to slow down and cause food to block the intestine. 4. Hernias. 5. Inflammatory bowel disease (colitis). 6. Twisting of the bowel (volvulus). 7. Tumors. 8. A foreign body. 9. Slipping of a part of the bowel into another part (intussusception). SYMPTOMS Symptoms of this condition include: 2. Abdominal pain. This may be dull cramps or sharp pain. It may occur in one area, or it may be present in the entire abdomen. Pain can range from mild to severe, depending on the degree of obstruction. 3. Nausea and vomiting. Vomit may be greenish or a yellow bile color. 4. Abdominal bloating. 5. Constipation. 6. Lack of passing gas. 7. Frequent belching. 8. Diarrhea. This may occur if the obstruction is partial and runny stool is able to leak around the obstruction. DIAGNOSIS This condition may be diagnosed based on a physical exam,  medical history, and X-rays of the abdomen. You may also have other tests, such as a CT scan of the abdomen and pelvis. TREATMENT Treatment for this condition depends on the cause and severity of the problem. Treatment options may include: 2. Bed rest along with fluids and pain medicines that are given through an IV tube inserted into one of your veins. Sometimes, this is all that is needed for the obstruction to improve. 3. Following a simple diet. In some cases, a clear liquid diet may be required for several days. This allows the bowel to rest. 4. Placement of a small tube (nasogastric tube) into the stomach. When the bowel is blocked, it usually swells up like a balloon that is filled with air and fluids. The air and fluids may be removed by suction through the nasogastric tube. This can help with pain, discomfort, and nausea. It can also help the obstruction to clear up faster. 5. Surgery. This may be required if other treatments do not work. Bowel obstruction from a hernia may require early surgery and can be an emergency procedure. Surgery may also be required for scar tissue that causes frequent or severe obstructions. HOME CARE INSTRUCTIONS  Get plenty of rest.  Follow instructions from your health care provider about eating restrictions. You may need to avoid solid foods and consume only clear liquids until your condition improves.  Take over-the-counter and prescription medicines only as told by your health care provider.  Keep all follow-up visits as told by your health care provider. This is important. SEEK MEDICAL CARE IF: 2. You have a fever. 3. You have chills. SEEK IMMEDIATE MEDICAL CARE IF: 2. You have increased pain or  cramping. 3. You vomit blood. 4. You have uncontrolled vomiting or nausea. 5. You cannot drink fluids because of vomiting or pain. 6. You develop confusion. 7. You begin feeling very dry or thirsty (dehydrated). 8. You have severe bloating. 9. You feel  extremely weak or you faint.   This information is not intended to replace advice given to you by your health care provider. Make sure you discuss any questions you have with your health care provider.      Diverticulitis  Diverticulitis is infection or inflammation of small pouches (diverticula) in the colon that form due to a condition called diverticulosis. Diverticula can trap stool (feces) and bacteria, causing infection and inflammation. Diverticulitis may cause severe stomach pain and diarrhea. It may lead to tissue damage in the colon that causes bleeding. The diverticula may also burst (rupture) and cause infected stool to enter other areas of the abdomen. Complications of diverticulitis can include:  Bleeding.  Severe infection.  Severe pain.  Rupture (perforation) of the colon.  Blockage (obstruction) of the colon. What are the causes? This condition is caused by stool becoming trapped in the diverticula, which allows bacteria to grow in the diverticula. This leads to inflammation and infection. What increases the risk? You are more likely to develop this condition if:  You have diverticulosis. The risk for diverticulosis increases if: ? You are overweight or obese. ? You use tobacco products. ? You do not get enough exercise.  You eat a diet that does not include enough fiber. High-fiber foods include fruits, vegetables, beans, nuts, and whole grains. What are the signs or symptoms? Symptoms of this condition may include:  Pain and tenderness in the abdomen. The pain is normally located on the left side of the abdomen, but it may occur in other areas.  Fever and chills.  Bloating.  Cramping.  Nausea.  Vomiting.  Changes in bowel routines.  Blood in your stool. How is this diagnosed? This condition is diagnosed based on:  Your medical history.  A physical exam.  Tests to make sure there is nothing else causing your condition. These tests may  include: ? Blood tests. ? Urine tests. ? Imaging tests of the abdomen, including X-rays, ultrasounds, MRIs, or CT scans. How is this treated? Most cases of this condition are mild and can be treated at home. Treatment may include:  Taking over-the-counter pain medicines.  Following a clear liquid diet.  Taking antibiotic medicines by mouth.  Rest. More severe cases may need to be treated at a hospital. Treatment may include:  Not eating or drinking.  Taking prescription pain medicine.  Receiving antibiotic medicines through an IV tube.  Receiving fluids and nutrition through an IV tube.  Surgery. When your condition is under control, your health care provider may recommend that you have a colonoscopy. This is an exam to look at the entire large intestine. During the exam, a lubricated, bendable tube is inserted into the anus and then passed into the rectum, colon, and other parts of the large intestine. A colonoscopy can show how severe your diverticula are and whether something else may be causing your symptoms. Follow these instructions at home: Medicines  Take over-the-counter and prescription medicines only as told by your health care provider. These include fiber supplements, probiotics, and stool softeners.  If you were prescribed an antibiotic medicine, take it as told by your health care provider. Do not stop taking the antibiotic even if you start to feel better.  Do not  drive or use heavy machinery while taking prescription pain medicine. General instructions   Follow a full liquid diet or another diet as directed by your health care provider. After your symptoms improve, your health care provider may tell you to change your diet. He or she may recommend that you eat a diet that contains at least 25 g (25 grams) of fiber daily. Fiber makes it easier to pass stool. Healthy sources of fiber include: ? Berries. One cup contains 4-8 grams of fiber. ? Beans or lentils. One  half cup contains 5-8 grams of fiber. ? Green vegetables. One cup contains 4 grams of fiber.  Exercise for at least 30 minutes, 3 times each week. You should exercise hard enough to raise your heart rate and break a sweat.  Keep all follow-up visits as told by your health care provider. This is important. You may need a colonoscopy. Contact a health care provider if:  Your pain does not improve.  You have a hard time drinking or eating food.  Your bowel movements do not return to normal. Get help right away if:  Your pain gets worse.  Your symptoms do not get better with treatment.  Your symptoms suddenly get worse.  You have a fever.  You vomit more than one time.  You have stools that are bloody, black, or tarry. Summary  Diverticulitis is infection or inflammation of small pouches (diverticula) in the colon that form due to a condition called diverticulosis. Diverticula can trap stool (feces) and bacteria, causing infection and inflammation.  You are at higher risk for this condition if you have diverticulosis and you eat a diet that does not include enough fiber.  Most cases of this condition are mild and can be treated at home. More severe cases may need to be treated at a hospital.  When your condition is under control, your health care provider may recommend that you have an exam called a colonoscopy. This exam can show how severe your diverticula are and whether something else may be causing your symptoms. This information is not intended to replace advice given to you by your health care provider. Make sure you discuss any questions you have with your health care provider. Document Released: 11/17/2004 Document Revised: 01/20/2017 Document Reviewed: 03/12/2016 Elsevier Patient Education  2020 [employer].

## 2019-01-01 NOTE — Progress Notes (Signed)
KALIEB RICKETSON [id number] 23-Jan-1951  CARE TEAM:  PCP: [name], MD  Outpatient Care Team: Patient Care Team: [name], MD as PCP - General (Family Medicine) [name], MD as PCP - Cardiology (Cardiology) Early, [name], MD as Consulting Physician (Vascular Surgery) [name], MD as Consulting Physician (Orthopedic Surgery) [name], MD as Consulting Physician (Cardiology) [name], MD as Consulting Physician (Colon and Rectal Surgery) [name], MD as Consulting Physician (Gastroenterology)  Inpatient Treatment Team: Treatment Team: Attending Provider: [name], MD; Consulting Physician: [name], Md, MD; Rounding Team: [name], MD; Technician: [name], NT; Consulting Physician: [name], MD; Utilization Review: Darryll Capers, RN; Registered Nurse: Tinnie Gens, RN   Problem List:   Principal Problem:   Diverticulitis of colon with perforation Active Problems:   Coronary artery disease involving native coronary artery of native heart without angina pectoris   Coronary stent 2008  restenosis due to progression of disease s/p restent 2010   Peripheral vascular disease ([place])   Obesity   Diabetes mellitus with peripheral artery disease ([place])   Dyslipidemia   OSA (obstructive sleep apnea)   BPPV (benign paroxysmal positional vertigo)   Chronic anticoagulation   Status post aortobifemoral bypass surgery 2013   Left-sided low back pain with left-sided sciatica   Nausea & vomiting   Hypokalemia   Hypomagnesemia   Protein-calorie malnutrition, moderate ([place])      * No surgery found *      Assessment  Improved  Plan:   IV antibiotics.  Refractory to piperacillin/tazobactam and ceftriaxone.  Stabilized and improving with meropenem/Eraxis.    Place PICC line.  That was ordered yesterday but has not happened yet due to high volume of request.  Hopefully this morning.  Certainly before discharge.  Plan IV meropenem  x2 weeks home IV therapy. -That has been set up through [facility] already.    Soft diet until surgery.  If has cramping or nausea, reduced down to blenderized/dysphagia 1 oral liquid diet as tolerated.  MiraLAX twice daily to avoid constipation.  Presume he is getting some partial colonic obstruction for his chronic diverticulitis."  He is turning around.   If not improved repeat CT scan in 4-5 days to rule out abscess formation or further progression.  If deteriorates rapidly, will require Hartmann resection.  Seems less likely now.  Continue plan of colonoscopy and robotic colectomy 23-24 November  Hypomagnesemia.  Improved from severe state.  Give more today IV.  Most likely discharge on oral magnesium.  With bowels working better, safe to restart  Hypertension, diabetes, hyperlipidemia per primary service.  Sleep apnea per primary service.  OK to restart Plavix since clinically improved and should be discharging today.  He is aware of holding it later this month 5 days before proceeding with surgery.  He is not in severe shock requiring emergent surgery at this time  This is been simmering for much of this year.  Hopefully can stabilize and turn around and still plan resection later this month when the hopes of the inflammation will calm down more and he had a chance to improve his nutritional status.  Dwindling into moderate malnutrition.  Try supplemental shakes.    -VTE prophylaxis- SCDs, etc  -mobilize as tolerated to help recovery  D/C patient from hospital when patient meets criteria (anticipate in today(s)):  -PICC line placement -Cleared by primary service   01/01/2019    Subjective: (Chief complaint)  Feeling much better overall.  Discomfort minimal.  Tolerated soft diet.  Hoping to go home today.  Objective:  Vital signs:  Vitals:   12/31/18 0405 12/31/18 1344 12/31/18 2057 01/01/19 0402  BP: 120/68 122/84 (!) 133/59 130/74  Pulse: 63  70 64 (!) 59  Resp: 18 20 16 16   Temp: 98.8 F (37.1 C) 98.2 F (36.8 C) 98.9 F (37.2 C) 98.5 F (36.9 C)  TempSrc: Oral Oral Oral Oral  SpO2: 96% 97% 98% 95%  Weight:      Height:        Last BM Date: 12/29/18  Intake/Output   Yesterday:  11/09 0701 - 11/10 0700 In: 1062 [P.O.:1062] Out: 700 [Urine:700] This shift:  No intake/output data recorded.  Bowel function:  Flatus: YES  BM:  YES  Drain: (No drain)   Physical Exam:  General: Pt awake/alert/oriented x4 in no acute distress.  Not toxic.  Not sickly. Eyes: PERRL, normal EOM.  Sclera clear.  No icterus Neuro: CN II-XII intact w/o focal sensory/motor deficits. Lymph: No head/neck/groin lymphadenopathy Psych:  No delerium/psychosis/paranoia HENT: Normocephalic, Mucus membranes moist.  No thrush Neck: Supple, No tracheal deviation Chest: No chest wall pain w good excursion CV:  Pulses intact.  Regular rhythm MS: Normal AROM mjr joints.  No obvious deformity  Abdomen: Soft.  Nondistended.  Nontender.  No evidence of peritonitis.  No incarcerated hernias.  Ext:  No deformity.  No mjr edema.  No cyanosis Skin: No petechiae / purpura  Results:   Cultures: Recent Results (from the past 720 hour(s))  SARS CORONAVIRUS 2 (TAT 6-24 HRS) Nasopharyngeal Nasopharyngeal Swab     Status: None   Collection Time: 12/28/18  9:31 PM   Specimen: Nasopharyngeal Swab  Result Value Ref Range Status   SARS Coronavirus 2 NEGATIVE NEGATIVE Final    Comment: (NOTE) SARS-CoV-2 target nucleic acids are NOT DETECTED. The SARS-CoV-2 RNA is generally detectable in upper and lower respiratory specimens during the acute phase of infection. Negative results do not preclude SARS-CoV-2 infection, do not rule out co-infections with other pathogens, and should not be used as the sole basis for treatment or other patient management decisions. Negative results must be combined with clinical observations, patient history, and  epidemiological information. The expected result is Negative. Fact Sheet for Patients: SugarRoll.be Fact Sheet for Healthcare Providers: https://www.woods-mathews.com/ This test is not yet approved or cleared by the [place] FDA and  has been authorized for detection and/or diagnosis of SARS-CoV-2 by FDA under an Emergency Use Authorization (EUA). This EUA will remain  in effect (meaning this test can be used) for the duration of the COVID-19 declaration under Section 56 4(b)(1) of the Act, 21 U.S.C. section 360bbb-3(b)(1), unless the authorization is terminated or revoked sooner. Performed at [facility], [place] [address], [place], [place] [address]     Labs: Results for orders placed or performed during the hospital encounter of 12/28/18 (from the past 48 hour(s))  Glucose, capillary     Status: Abnormal   Collection Time: 12/30/18 11:13 AM  Result Value Ref Range   Glucose-Capillary 113 (H) 70 - 99 mg/dL  Glucose, capillary     Status: None   Collection Time: 12/30/18  4:20 PM  Result Value Ref Range   Glucose-Capillary 88 70 - 99 mg/dL  Glucose, capillary     Status: Abnormal   Collection Time: 12/30/18  8:01 PM  Result Value Ref Range   Glucose-Capillary 132 (H) 70 - 99 mg/dL  Glucose, capillary  Status: Abnormal   Collection Time: 12/30/18 11:47 PM  Result Value Ref Range   Glucose-Capillary 110 (H) 70 - 99 mg/dL  Glucose, capillary     Status: None   Collection Time: 12/31/18  4:08 AM  Result Value Ref Range   Glucose-Capillary 95 70 - 99 mg/dL  Magnesium QAM x 5d     Status: None   Collection Time: 12/31/18  5:45 AM  Result Value Ref Range   Magnesium 1.7 1.7 - 2.4 mg/dL    Comment: Performed at [facility], [place] [address], [place], [place] [id number]  Basic metabolic panel daily     Status: Abnormal   Collection Time: 12/31/18  5:45 AM  Result Value Ref Range   Sodium 140 135 - 145 mmol/L    Potassium 4.2 3.5 - 5.1 mmol/L   Chloride 110 98 - 111 mmol/L   CO2 23 22 - 32 mmol/L   Glucose, Bld 103 (H) 70 - 99 mg/dL   BUN 6 (L) 8 - 23 mg/dL   Creatinine, Ser 0.66 0.61 - 1.24 mg/dL   Calcium 8.7 (L) 8.9 - 10.3 mg/dL   GFR calc non Af Amer >60 >60 mL/min   GFR calc Af Amer >60 >60 mL/min   Anion gap 7 5 - 15    Comment: Performed at [facility], [place] [address] [place], [place] [address]  CBC daily once in am     Status: Abnormal   Collection Time: 12/31/18  5:45 AM  Result Value Ref Range   WBC 6.7 4.0 - 10.5 K/uL   RBC 4.02 (L) 4.22 - 5.81 MIL/uL   Hemoglobin 12.6 (L) 13.0 - 17.0 g/dL   HCT 38.8 (L) 39.0 - 52.0 %   MCV 96.5 80.0 - 100.0 fL   MCH 31.3 26.0 - 34.0 pg   MCHC 32.5 30.0 - 36.0 g/dL   RDW 12.9 11.5 - 15.5 %   Platelets 228 150 - 400 K/uL   nRBC 0.0 0.0 - 0.2 %    Comment: Performed at [facility], [place] [address] [place], [place] [address]  Glucose, capillary     Status: None   Collection Time: 12/31/18  7:48 AM  Result Value Ref Range   Glucose-Capillary 94 70 - 99 mg/dL  Glucose, capillary     Status: Abnormal   Collection Time: 12/31/18 11:20 AM  Result Value Ref Range   Glucose-Capillary 156 (H) 70 - 99 mg/dL  Glucose, capillary     Status: Abnormal   Collection Time: 12/31/18  3:40 PM  Result Value Ref Range   Glucose-Capillary 134 (H) 70 - 99 mg/dL  Glucose, capillary     Status: Abnormal   Collection Time: 12/31/18  8:59 PM  Result Value Ref Range   Glucose-Capillary 139 (H) 70 - 99 mg/dL  Glucose, capillary     Status: Abnormal   Collection Time: 01/01/19 12:05 AM  Result Value Ref Range   Glucose-Capillary 159 (H) 70 - 99 mg/dL  Glucose, capillary     Status: Abnormal   Collection Time: 01/01/19  4:03 AM  Result Value Ref Range   Glucose-Capillary 115 (H) 70 - 99 mg/dL  Magnesium QAM x 5d     Status: Abnormal   Collection Time: 01/01/19  5:10 AM  Result Value Ref Range   Magnesium 1.5 (L) 1.7 -  2.4 mg/dL    Comment: Performed at [facility], [place] [address] [place], [place] [address]  Basic metabolic panel daily     Status: Abnormal   Collection Time: 01/01/19  5:10 AM  Result Value Ref Range   Sodium 138 135 - 145 mmol/L   Potassium 4.4 3.5 - 5.1 mmol/L   Chloride 107 98 - 111 mmol/L   CO2 23 22 - 32 mmol/L   Glucose, Bld 120 (H) 70 - 99 mg/dL   BUN 6 (L) 8 - 23 mg/dL   Creatinine, Ser 0.61 0.61 - 1.24 mg/dL   Calcium 9.0 8.9 - 10.3 mg/dL   GFR calc non Af Amer >60 >60 mL/min   GFR calc Af Amer >60 >60 mL/min   Anion gap 8 5 - 15    Comment: Performed at [facility], [place] [address] [place], [place] [address]  Glucose, capillary     Status: Abnormal   Collection Time: 01/01/19  8:01 AM  Result Value Ref Range   Glucose-Capillary 109 (H) 70 - 99 mg/dL    Imaging / Studies: [place] Ekg Site Rite  Result Date: 12/31/2018 If Site Rite image not attached, placement could not be confirmed due to current cardiac rhythm.   Medications / Allergies: per chart  Antibiotics: Anti-infectives (From admission, onward)   Start     Dose/Rate Route Frequency Ordered Stop   01/01/19 0000  meropenem (MERREM) IVPB     1 g Intravenous Every 8 hours 01/01/19 0759 01/15/19 2359   12/29/18 2200  anidulafungin (ERAXIS) 100 mg in sodium chloride 0.9 % 100 mL IVPB     100 mg 78 mL/hr over 100 Minutes Intravenous Every 24 hours 12/28/18 2119     12/29/18 1400  neomycin (MYCIFRADIN) tablet 1,000 mg  Status:  Discontinued     1,000 mg Oral 3 times per day 12/28/18 2340 12/29/18 0217   12/29/18 1400  metroNIDAZOLE (FLAGYL) tablet 1,000 mg  Status:  Discontinued     1,000 mg Oral 3 times per day 12/28/18 2340 12/29/18 0217   12/29/18 0600  cefoTEtan (CEFOTAN) 2 g in sodium chloride 0.9 % 100 mL IVPB  Status:  Discontinued     2 g 200 mL/hr over 30 Minutes Intravenous On call to O.R. 12/28/18 2340 12/29/18 0217   12/28/18 2345  clindamycin (CLEOCIN) 900 mg,  gentamicin (GARAMYCIN) 240 mg in sodium chloride 0.9 % 1,000 mL for intraperitoneal lavage  Status:  Discontinued      Irrigation To Surgery 12/28/18 2340 12/29/18 0217   12/28/18 2200  meropenem (MERREM) 1 g in sodium chloride 0.9 % 100 mL IVPB     1 g 200 mL/hr over 30 Minutes Intravenous Every 8 hours 12/28/18 2042     12/28/18 2130  anidulafungin (ERAXIS) 200 mg in sodium chloride 0.9 % 200 mL IVPB     200 mg 78 mL/hr over 200 Minutes Intravenous  Once 12/28/18 2119 12/29/18 0229   12/28/18 2045  piperacillin-tazobactam (ZOSYN) IVPB 3.375 g  Status:  Discontinued     3.375 g 12.5 mL/hr over 240 Minutes Intravenous Every 8 hours 12/28/18 2040 12/28/18 2042   12/28/18 2045  anidulafungin (ERAXIS) 100 mg in sodium chloride 0.9 % 100 mL IVPB  Status:  Discontinued     100 mg 78 mL/hr over 100 Minutes Intravenous Every 24 hours 12/28/18 2042 12/28/18 2118   12/28/18 2030  meropenem (MERREM) 1 g in sodium chloride 0.9 % 100 mL IVPB  Status:  Discontinued     1 g 200 mL/hr over 30 Minutes Intravenous  Once 12/28/18 2016 12/28/18  2040   12/28/18 2015  ertapenem (INVANZ) 1,000 mg in sodium chloride 0.9 % 100 mL IVPB  Status:  Discontinued     1 g 200 mL/hr over 30 Minutes Intravenous  Once 12/28/18 2001 12/28/18 2016        Note: Portions of this report may have been transcribed using voice recognition software. Every effort was made to ensure accuracy; however, inadvertent computerized transcription errors may be present.   Any transcriptional errors that result from this process are unintentional.     [name], MD, FACS, MASCRS Gastrointestinal and Minimally Invasive Surgery    [address], [place], [place] [address] [phone] Main / Paging [phone] Fax

## 2019-01-03 NOTE — Discharge Summary (Signed)
Physician Discharge Summary  ANTHON HARPOLE BVQ:[id number] DOB: 26-Sep-1950 DOA: 12/28/2018  PCP: [name], MD  Admit date: 12/28/2018 Discharge date: 01/01/2019  Admitted From:Home.  Disposition: Home.   Recommendations for Outpatient Follow-up:  1. Follow up with PCP in 1-2 weeks 2. Please obtain BMP/CBC in one week 3. Please follow up with surgery as recommended.    Home Health:yes.  Equipment/Devices:IV antibiotics.   Discharge Condition:stable.  CODE STATUS:full code.  Diet recommendation: Heart Healthy   Brief/Interim Summary: 68 year old male with prior h/o CAD, type 2 DM, PVD, sleep apnea not on CPAP, started having bouts of diverticulitis from June 2020, another episode in September, presents with persistent nausea, vomiting , abdominal pain and diarrhea. On arrival ED CT abd and pelvis show sigmoid diverticulitis with microperforation and phlegmon and increasing fluid collection. Surgery consulted, recommended to continue with IV antibiotics, IV fluids, and a repeat CT abd and pelvis in 5 days to watch for improvement , if no improvement, plan for surgical resection.  Patient seen and examined today he continues to improve he denies any nausea vomiting or abdominal pain and surgery advance diet to soft diet.  PICC line was ordered for 2 weeks of IV antibiotics on discharge  Discharge Diagnoses:  Principal Problem:   Diverticulitis of colon with perforation Active Problems:   Peripheral vascular disease ([place])   Obesity   Diabetes mellitus with peripheral artery disease ([place])   Coronary artery disease involving native coronary artery of native heart without angina pectoris   Dyslipidemia   OSA (obstructive sleep apnea)   BPPV (benign paroxysmal positional vertigo)   Chronic anticoagulation   Coronary stent 2008  restenosis due to progression of disease s/p restent 2010   Status post aortobifemoral bypass surgery 2013   Left-sided low back pain with left-sided  sciatica   Nausea & vomiting   Hypokalemia   Hypomagnesemia   Protein-calorie malnutrition, moderate (HCC)    Sigmoid Diverticulitis : Improving.  Patient denies any nausea vomiting abdominal pain and his diarrhea has improved.  He was started on clear liquid diet and advance to soft diet without any issues. He was discharged on  IV meropenem for 2 weeks as per surgery recommendations on discharge.   Patient remains afebrile and his white count is within normal limits.  Hypokalemia: Replaced  Hypomagnesemia  Replaced  Mod protein calorie malnutrition;  Nutritionist on board.    CAD s/p PCI:  Hold  Plavix.  Patient denies any chest pain or shortness of breath at this time.  Mild anemia of chronic disease Hemoglobin stable around 12.  Continue to monitor   Diabetes Mellitus: Well controlled CBGs CBG (last 3)  Recent Labs (last 2 labs)        Recent Labs    12/31/18 0408 12/31/18 0748 12/31/18 1120  GLUCAP 95 94 156*     Resume SSI.  A1c is 6.4.   Dyslipidemia Follow-up lipid panel as outpatient.  OSA: not on CPAP.    Moderate protein calorie malnutrition Nutrition consulted and supplementation added.    Discharge Instructions  Discharge Instructions    Diet - low sodium heart healthy   Complete by: As directed    Discharge instructions   Complete by: As directed    Please follow up with surgery as recommended.  Please hold plavix , for 5 days prior to the surgery.   Home infusion instructions [facility] May follow [place] Dosing Protocol; May administer Cathflo as needed to maintain patency  of vascular access device.; Flushing of vascular access device: per [facility] Protocol: 0.9% NaCl pre/post medica...   Complete by: As directed    Instructions: May follow [place] Dosing Protocol   Instructions: May administer Cathflo as needed to maintain patency of vascular access device.   Instructions: Flushing of vascular access device:  per [facility] Protocol: 0.9% NaCl pre/post medication administration and prn patency; Heparin 100 u/ml, 259m for implanted ports and Heparin 10u/ml, 567mfor all other central venous catheters.   Instructions: May follow [facility] Anaphylaxis Protocol for First Dose Administration in the home: 0.9% NaCl at 25-50 ml/hr to maintain IV access for protocol meds. Epinephrine 0.3 ml IV/IM PRN and Benadryl 25-50 IV/IM PRN s/s of anaphylaxis.   Instructions: AdSourisnfusion Coordinator (RN) to assist per patient IV care needs in the home PRN.     Allergies as of 01/01/2019      Reactions   Ciprofloxacin Rash   Daypro [oxaprozin] Rash      Medication List    STOP taking these medications   nitroGLYCERIN 0.4 MG SL tablet Commonly known as: NITROSTAT     TAKE these medications   aspirin EC 81 MG tablet Take 81 mg by mouth at bedtime.   atorvastatin 80 MG tablet Commonly known as: LIPITOR TAKE 1 TABLET BY MOUTH AT  BEDTIME What changed: when to take this   clopidogrel 75 MG tablet Commonly known as: PLAVIX TAKE 1 TABLET BY MOUTH AT  BEDTIME What changed: when to take this   fenofibrate 160 MG tablet Take 160 mg by mouth at bedtime.   hydrocortisone 2.5 % rectal cream Commonly known as: ANUSOL-HC Apply 1 application topically 4 (four) times daily as needed for hemorrhoids.   magnesium oxide 400 (241.3 Mg) MG tablet Commonly known as: MAG-OX Take 1 tablet (400 mg total) by mouth daily.   meropenem  IVPB Commonly known as: MERREM Inject 1 g into the vein every 8 (eight) hours for 14 days. Indication:  Intra-abd infection Last Day of Therapy:  11/23 Labs - Once weekly:  CBC/D and BMP, Labs - Every other week:  ESR and CRP   metoprolol succinate 25 MG 24 hr tablet Commonly known as: TOPROL-XL TAKE 1 TABLET BY MOUTH  DAILY   ondansetron 4 MG disintegrating tablet Commonly known as: ZOFRAN-ODT Take 4 mg by mouth every 8 (eight) hours as needed for nausea/vomiting.   pantoprazole  40 MG tablet Commonly known as: PROTONIX Take 40 mg by mouth at bedtime.   polyethylene glycol 17 g packet Commonly known as: MIRALAX / GLYCOLAX Take 17 g by mouth 2 (two) times daily.            Home Infusion Instuctions  (From admission, onward)         Start     Ordered   01/01/19 0000  Home infusion instructions [facility] May follow [facility] Protocol; May administer Cathflo as needed to maintain patency of vascular access device.; [facility] vascular [facility]: 0.9% NaCl pre/post medica...    Question Answer Comment  Instructions May follow [place] [place] Protocol   Instructions May administer Cathflo as needed to maintain patency of vascular access device.   Instructions Flushing of vascular access device[facility]: 0.9% NaCl pre/post medication administration and prn patency; Heparin 100 u/ml, 59m46mor implanted ports and Heparin 10u/ml, 59ml82mr all other central venous catheters.   Instructions May follow [facility] Anaphylaxis Protocol for First  Dose Administration in the home: 0.9% NaCl at 25-50 ml/hr to maintain IV access for protocol meds. Epinephrine 0.3 ml IV/IM PRN and Benadryl 25-50 IV/IM PRN s/s of anaphylaxis.   Instructions [facility] Infusion Coordinator (RN) to assist per patient IV care needs in the home PRN.      01/01/19 0759         Follow-up Information    [name], MD Follow up.   Specialty: General Surgery Why: Continue plan for surgery later this month.  24 Nov.  Colonoscopy the day before through [facility] gastroenterology Contact information: [address] [place] [address] [phone]        [name], MD Follow up.   Specialty: Gastroenterology Why: Plan bowel prep and colonoscopy the day before surgery, 23 November Contact information: [address] [place] [address] [phone]          Allergies  Allergen Reactions  . Ciprofloxacin  Rash  . Daypro [Oxaprozin] Rash    Consultations:  General surgery.    Procedures/Studies: Ct Abdomen Pelvis W Contrast  Result Date: 12/28/2018 CLINICAL DATA:  Diverticulitis EXAM: CT ABDOMEN AND PELVIS WITH CONTRAST TECHNIQUE: Multidetector CT imaging of the abdomen and pelvis was performed using the standard protocol following bolus administration of intravenous contrast. CONTRAST:  164m OMNIPAQUE IOHEXOL 300 MG/ML  SOLN COMPARISON:  November 03, 2018 FINDINGS: Lower chest: The visualized heart size within normal limits. No pericardial fluid/thickening. No hiatal hernia. The visualized portions of the lungs are clear. Hepatobiliary: The liver is normal in density without focal abnormality.The main portal vein is patent. No evidence of calcified gallstones, gallbladder wall thickening or biliary dilatation. Pancreas: Unremarkable. No pancreatic ductal dilatation or surrounding inflammatory changes. Spleen: Normal in size without focal abnormality. Adrenals/Urinary Tract: Both adrenal glands appear normal. Tiny low-density lesions are seen within both kidneys as on the prior exam. Stomach/Bowel: The stomach and small bowel are normal in appearance. Again noted is a focal segment of distal sigmoid colon diverticulitis with diffuse bowel wall thickening. Tiny foci of free air are again noted superior to area at with non loculated fluid. There likely increased appearance of the non loculated collection since the prior exam the appendix is unremarkable. Vascular/Lymphatic: There are no enlarged mesenteric, retroperitoneal, or pelvic lymph nodes. Scattered aortic atherosclerotic calcifications are seen without aneurysmal dilatation. Again noted is a aorto bi femoral stent graft. Reproductive: The prostate is heterogeneous in appearance. Other: No evidence of abdominal wall mass or hernia. Musculoskeletal: No acute or significant osseous findings. Prior posterior lumbar spine fixation is seen. IMPRESSION:  Again noted is distal sigmoid diverticulitis with adjacent micro perforation non loculated fluid which could represent phlegmon. The non-loculated collection appears slightly more prominent than the prior exam. Electronically Signed   By: BPrudencio PairM.D.   On: 12/28/2018 19:51   [place]Ekg Site Rite  Result Date: 12/31/2018 If Site Rite image not attached, placement could not be confirmed due to current cardiac rhythm.      Subjective: No new complaints.   Discharge Exam: Vitals:   12/31/18 2057 01/01/19 0402  BP: (!) 133/59 130/74  Pulse: 64 (!) 59  Resp: 16 16  Temp: 98.9 F (37.2 C) 98.5 F (36.9 C)  SpO2: 98% 95%   Vitals:   12/31/18 0405 12/31/18 1344 12/31/18 2057 01/01/19 0402  BP: 120/68 122/84 (!) 133/59 130/74  Pulse: 63 70 64 (!) 59  Resp: _0 Temp: 98.8 F (  37.1 C) 98.2 F (36.8 C) 98.9 F (37.2 C) 98.5 F (36.9 C)  TempSrc: Oral Oral Oral Oral  SpO2: 96% 97% 98% 95%  Weight:      Height:        General: Pt is alert, awake, not in acute distress Cardiovascular: RRR, S1/S2 +, no rubs, no gallops Respiratory: CTA bilaterally, no wheezing, no rhonchi Abdominal: Soft, NT, ND, bowel sounds + Extremities: no edema, no cyanosis    The results of significant diagnostics from this hospitalization (including imaging, microbiology, ancillary and laboratory) are listed below for reference.     Microbiology: Recent Results (from the past 240 hour(s))  SARS CORONAVIRUS 2 (TAT 6-24 HRS) Nasopharyngeal Nasopharyngeal Swab     Status: None   Collection Time: 12/28/18  9:31 PM   Specimen: Nasopharyngeal Swab  Result Value Ref Range Status   SARS Coronavirus 2 NEGATIVE NEGATIVE Final    Comment: (NOTE) SARS-CoV-2 target nucleic acids are NOT DETECTED. The SARS-CoV-2 RNA is generally detectable in upper and lower respiratory specimens during the acute phase of infection. Negative results do not preclude SARS-CoV-2 infection, do not rule out co-infections  with other pathogens, and should not be used as the sole basis for treatment or other patient management decisions. Negative results must be combined with clinical observations, patient history, and epidemiological information. The expected result is Negative. Fact Sheet for Patients: SugarRoll.be Fact Sheet for Healthcare Providers: https://www.woods-mathews.com/ This test is not yet approved or cleared by the [place] FDA and  has been authorized for detection and/or diagnosis of SARS-CoV-2 by FDA under an Emergency Use Authorization (EUA). This EUA will remain  in effect (meaning this test can be used) for the duration of the COVID-19 declaration under Section 56 4(b)(1) of the Act, 21 U.S.C. section 360bbb-3(b)(1), unless the authorization is terminated or revoked sooner. Performed at [facility], [place] [address] [place],  [address]      Labs: BNP (last 3 results) No results for input(s): BNP in the last 8760 hours. Basic Metabolic Panel: Recent Labs  Lab 12/29/18 0059 12/29/18 0531 12/30/18 0519 12/31/18 0545 01/01/19 0510  NA 141 143 142 140 138  K 3.2* 3.4* 3.7 4.2 4.4  CL 105 107 110 110 107  CO2 _0 GLUCOSE 128* 103* 95 103* 120*  BUN 11 11 6* 6* 6*  CREATININE 0.76 0.67 0.60* 0.66 0.61  CALCIUM 8.1* 8.0* 8.0* 8.7* 9.0  MG 0.4*  --  1.6* 1.7 1.5*   Liver Function Tests: Recent Labs  Lab 12/28/18 1601 12/29/18 0531  AST 26 17  ALT 16 13  ALKPHOS 88 63  BILITOT 1.0 0.8  PROT 7.4 5.4*  ALBUMIN 4.0 3.0*   Recent Labs  Lab 12/28/18 1601  LIPASE 25   No results for input(s): AMMONIA in the last 168 hours. CBC: Recent Labs  Lab 12/28/18 1601 12/29/18 0059 12/29/18 0531 12/30/18 0519 12/31/18 0545  WBC 11.9* 11.7* 9.9 7.5 6.7  NEUTROABS 9.1*  --  7.7  --   --   HGB 15.9 13.7 13.0 12.0* 12.6*  HCT 47.9 41.8 39.7 37.0* 38.8*  MCV 94.3 96.1 95.2 97.1 96.5  PLT 311 217 218 209  228   Cardiac Enzymes: No results for input(s): CKTOTAL, CKMB, CKMBINDEX, TROPONINI in the last 168 hours. BNP: Invalid input(s): POCBNP CBG: Recent Labs  Lab 12/31/18 2059 01/01/19 0005 01/01/19 0403 01/01/19 0801 01/01/19 1147  GLUCAP 139* 159* 115* 109* 156*  D-Dimer No results for input(s): DDIMER in the last 72 hours. Hgb A1c No results for input(s): HGBA1C in the last 72 hours. Lipid Profile No results for input(s): CHOL, HDL, LDLCALC, TRIG, CHOLHDL, LDLDIRECT in the last 72 hours. Thyroid function studies No results for input(s): TSH, T4TOTAL, T3FREE, THYROIDAB in the last 72 hours.  Invalid input(s): FREET3 Anemia work up No results for input(s): VITAMINB12, FOLATE, FERRITIN, TIBC, IRON, RETICCTPCT in the last 72 hours. Urinalysis    Component Value Date/Time   COLORURINE AMBER (A) 12/28/2018 1601   APPEARANCEUR HAZY (A) 12/28/2018 1601   LABSPEC 1.027 12/28/2018 1601   PHURINE 5.0 12/28/2018 1601   GLUCOSEU 50 (A) 12/28/2018 1601   HGBUR NEGATIVE 12/28/2018 1601   BILIRUBINUR NEGATIVE 12/28/2018 1601   KETONESUR 5 (A) 12/28/2018 1601   PROTEINUR >=300 (A) 12/28/2018 1601   UROBILINOGEN 0.2 01/22/2010 1943   NITRITE NEGATIVE 12/28/2018 1601   LEUKOCYTESUR NEGATIVE 12/28/2018 1601   Sepsis Labs Invalid input(s): PROCALCITONIN,  WBC,  LACTICIDVEN Microbiology Recent Results (from the past 240 hour(s))  SARS CORONAVIRUS 2 (TAT 6-24 HRS) Nasopharyngeal Nasopharyngeal Swab     Status: None   Collection Time: 12/28/18  9:31 PM   Specimen: Nasopharyngeal Swab  Result Value Ref Range Status   SARS Coronavirus 2 NEGATIVE NEGATIVE Final    Comment: (NOTE) SARS-CoV-2 target nucleic acids are NOT DETECTED. The SARS-CoV-2 RNA is generally detectable in upper and lower respiratory specimens during the acute phase of infection. Negative results do not preclude SARS-CoV-2 infection, do not rule out co-infections with other pathogens, and should not be used as  the sole basis for treatment or other patient management decisions. Negative results must be combined with clinical observations, patient history, and epidemiological information. The expected result is Negative. Fact Sheet for Patients: SugarRoll.be Fact Sheet for Healthcare Providers: https://www.woods-mathews.com/ This test is not yet approved or cleared by the [place] FDA and  has been authorized for detection and/or diagnosis of SARS-CoV-2 by FDA under an Emergency Use Authorization (EUA). This EUA will remain  in effect (meaning this test can be used) for the duration of the COVID-19 declaration under Section 56 4(b)(1) of the Act, 21 U.S.C. section 360bbb-3(b)(1), unless the authorization is terminated or revoked sooner. Performed at [facility], [place] [address], [place], [place] [address]      Time coordinating discharge: 68mnutes  SIGNED:   VHosie Poisson MD  [facility] 01/03/2019, 1:19 PM

## 2019-01-10 ENCOUNTER — Ambulatory Visit: Payer: Self-pay | Admitting: Surgery

## 2019-01-10 NOTE — Progress Notes (Signed)
PCP - [name] MD   Cardiologist - Vanice Sarah MD  Chest x-ray - none EKG - 12/28/2018 Stress Test -  ECHO - 09/28/2018 Cardiac Cath - 2016  Sleep Study - unsure CPAP - does not wear  Fasting Blood Sugar - pt is unsure he denies having DM Checks Blood Sugar _0____ times a day  Blood Thinner Instructions: Hold Plavix 5 days prior to surgery.    Last Dose of plavix on 01/09/2019  Aspirin Instructions: 81 mg Last Dose: 01/09/2019  Anesthesia review:   Patient denies shortness of breath, fever, cough and chest pain at PAT appointment   Patient verbalized understanding of instructions that were given to them at the PAT appointment. Patient was also instructed that they will need to review over the PAT instructions again at home before surgery.

## 2019-01-10 NOTE — Progress Notes (Signed)
Pt has a pre op appt on 01/14/19 please place orders in epic ASAP. Thank you and have a great day.

## 2019-01-10 NOTE — Patient Instructions (Addendum)
DUE TO COVID-19 ONLY ONE VISITOR IS ALLOWED TO COME WITH YOU AND STAY IN THE WAITING ROOM ONLY DURING PRE OP AND PROCEDURE DAY OF SURGERY. THE 1 VISITOR MAY VISIT WITH YOU AFTER SURGERY IN YOUR PRIVATE ROOM DURING VISITING HOURS ONLY!  YOU NEED TO HAVE A COVID 19 TEST ON_Saturday 11/21/2020______ @_______ , THIS TEST MUST BE DONE BEFORE SURGERY, COME  [address], [address] [place] , [address].  ([place]) ONCE YOUR COVID TEST IS COMPLETED, PLEASE BEGIN THE QUARANTINE INSTRUCTIONS AS OUTLINED IN YOUR HANDOUT.                [name]    Your procedure is scheduled on: Wednesday 01/16/2019   Report to [facility] Main  Entrance    Report to admitting at 10:30 AM     Call this number if you have problems the morning of surgery [phone]    Remember: Do not eat food or drink liquids after Midnight. Please follow your bowel prep the day before surgery. Drink plenty of fluids.    BRUSH YOUR TEETH MORNING OF SURGERY AND RINSE YOUR MOUTH OUT, NO CHEWING GUM CANDY OR MINTS.  DRINK 2 PRESURGERY ENSURE DRINKS THE NIGHT BEFORE SURGERY AT   1000 PM AND 1 PRESURGERY DRINK THE DAY OF THE PROCEDURE 3 HOURS PRIOR TO SCHEDULED SURGERY.   NO SOLIDS AFTER MIDNIGHT THE DAY PRIOR TO THE SURGERY. NOTHING BY MOUTH EXCEPT CLEAR LIQUIDS UNTIL THREE HOURS PRIOR TO SCHEDULED SURGERY.   PLEASE FINISH PRESURGERY ENSURE DRINK PER SURGEON ORDER 3 HOURS PRIOR TO SCHEDULED SURGERY TIME WHICH NEEDS TO BE COMPLETED AT 9:30 am_________.    CLEAR LIQUID DIET   Foods Allowed                                                                     Foods Excluded  Coffee and tea, regular and decaf                             liquids that you cannot  Plain Jell-O any favor except red or purple             see through such as: Fruit ices (not with fruit pulp)                                     milk, soups, orange juice  Iced Popsicles                                    All solid  food Carbonated beverages, regular and diet                                    Cranberry, grape and apple juices Sports drinks like Gatorade Lightly seasoned clear broth or consume(fat free) Sugar, honey syrup  Sample Menu Breakfast  Lunch                                     Supper Cranberry juice                    Beef broth                            Chicken broth Jell-O                                     Grape juice                           Apple juice Coffee or tea                        Jell-O                                      Popsicle                                                Coffee or tea                        Coffee or tea  _____________________________________________________________________       Take these medicines the morning of surgery with A SIP OF WATER:none                                 You may not have any metal on your body including hair pins and              piercings  Do not wear jewelry, make-up, lotions, powders or cologne, deodorant             Men may shave face and neck.   Do not bring valuables to the hospital. [place].  Contacts, dentures or bridgework may not be worn into surgery.  Leave suitcase in the car. After surgery it may be brought to your room.                Please read over the following fact sheets you were given: _____________________________________________________________________             [facility] - Preparing for Surgery Before surgery, you can play an important role.  Because skin is not sterile, your skin needs to be as free of germs as possible.  You can reduce the number of germs on your skin by washing with CHG (chlorahexidine gluconate) soap before surgery.  CHG is an antiseptic cleaner which kills germs and bonds with the skin to continue killing germs even after washing. Please DO NOT use if you have an allergy to CHG or  antibacterial soaps.  If your skin becomes reddened/irritated stop using the CHG and inform your  nurse when you arrive at Short Stay. Do not shave (including legs and underarms) for at least 48 hours prior to the first CHG shower.  You may shave your face/neck. Please follow these instructions carefully:  1.  Shower with CHG Soap the night before surgery and the  morning of Surgery.  2.  If you choose to wash your hair, wash your hair first as usual with your  normal  shampoo.  3.  After you shampoo, rinse your hair and body thoroughly to remove the  shampoo.                            4.  Use CHG as you would any other liquid soap.  You can apply chg directly  to the skin and wash                       Gently with a scrungie or clean washcloth.  5.  Apply the CHG Soap to your body ONLY FROM THE NECK DOWN.   Do not use on face/ open                           Wound or open sores. Avoid contact with eyes, ears mouth and genitals (private parts).                       Wash face,  Genitals (private parts) with your normal soap.             6.  Wash thoroughly, paying special attention to the area where your surgery  will be performed.  7.  Thoroughly rinse your body with warm water from the neck down.  8.  DO NOT shower/wash with your normal soap after using and rinsing off  the CHG Soap.                9.  Pat yourself dry with a clean towel.            10.  Wear clean pajamas.            11.  Place clean sheets on your bed the night of your first shower and do not  sleep with pets. Day of Surgery : Do not apply any lotions/deodorants the morning of surgery.  Please wear clean clothes to the hospital/surgery center.  FAILURE TO FOLLOW THESE INSTRUCTIONS MAY RESULT IN THE CANCELLATION OF YOUR SURGERY PATIENT SIGNATURE_________________________________  NURSE SIGNATURE__________________________________  ________________________________________________________________________  Adam Phenix  An incentive spirometer is a tool that can help keep your lungs clear and active. This tool measures how well you are filling your lungs with each breath. Taking long deep breaths may help reverse or decrease the chance of developing breathing (pulmonary) problems (especially infection) following:  A long period of time when you are unable to move or be active. BEFORE THE PROCEDURE   If the spirometer includes an indicator to show your best effort, your nurse or respiratory therapist will set it to a desired goal.  If possible, sit up straight or lean slightly forward. Try not to slouch.  Hold the incentive spirometer in an upright position. INSTRUCTIONS FOR USE  1. Sit on the edge of your bed if possible, or sit up as far as you can in bed or on a chair. 2. Hold the incentive spirometer in an upright position. 3. Breathe  out normally. 4. Place the mouthpiece in your mouth and seal your lips tightly around it. 5. Breathe in slowly and as deeply as possible, raising the piston or the ball toward the top of the column. 6. Hold your breath for 3-5 seconds or for as long as possible. Allow the piston or ball to fall to the bottom of the column. 7. Remove the mouthpiece from your mouth and breathe out normally. 8. Rest for a few seconds and repeat Steps 1 through 7 at least 10 times every 1-2 hours when you are awake. Take your time and take a few normal breaths between deep breaths. 9. The spirometer may include an indicator to show your best effort. Use the indicator as a goal to work toward during each repetition. 10. After each set of 10 deep breaths, practice coughing to be sure your lungs are clear. If you have an incision (the cut made at the time of surgery), support your incision when coughing by placing a pillow or rolled up towels firmly against it. Once you are able to get out of bed, walk around indoors and cough well. You may stop using the incentive spirometer when  instructed by your caregiver.  RISKS AND COMPLICATIONS  Take your time so you do not get dizzy or light-headed.  If you are in pain, you may need to take or ask for pain medication before doing incentive spirometry. It is harder to take a deep breath if you are having pain. AFTER USE  Rest and breathe slowly and easily.  It can be helpful to keep track of a log of your progress. Your caregiver can provide you with a simple table to help with this. If you are using the spirometer at home, follow these instructions: Walker IF:   You are having difficultly using the spirometer.  You have trouble using the spirometer as often as instructed.  Your pain medication is not giving enough relief while using the spirometer.  You develop fever of 100.5 F (38.1 C) or higher. SEEK IMMEDIATE MEDICAL CARE IF:   You cough up bloody sputum that had not been present before.  You develop fever of 102 F (38.9 C) or greater.  You develop worsening pain at or near the incision site. MAKE SURE YOU:   Understand these instructions.  Will watch your condition.  Will get help right away if you are not doing well or get worse. Document Released: 06/20/2006 Document Revised: 05/02/2011 Document Reviewed: 08/21/2006 ExitCare Patient Information 2014 ExitCare, [place].   ________________________________________________________________________ How to Manage Your Diabetes Before and After Surgery  Why is it important to control my blood sugar before and after surgery? . Improving blood sugar levels before and after surgery helps healing and can limit problems. . A way of improving blood sugar control is eating a healthy diet by: o  Eating less sugar and carbohydrates o  Increasing activity/exercise o  Talking with your doctor about reaching your blood sugar goals . High blood sugars (greater than 180 mg/dL) can raise your risk of infections and slow your recovery, so you will need to  focus on controlling your diabetes during the weeks before surgery. . Make sure that the doctor who takes care of your diabetes knows about your planned surgery including the date and location.  How do I manage my blood sugar before surgery? . Check your blood sugar at least 4 times a day, starting 2 days before surgery, to make sure that the level  is not too high or low. o Check your blood sugar the morning of your surgery when you wake up and every 2 hours until you get to the Short Stay unit. . If your blood sugar is less than 70 mg/dL, you will need to treat for low blood sugar: o Do not take insulin. o Treat a low blood sugar (less than 70 mg/dL) with  cup of clear juice (cranberry or apple), 4 glucose tablets, OR glucose gel. o Recheck blood sugar in 15 minutes after treatment (to make sure it is greater than 70 mg/dL). If your blood sugar is not greater than 70 mg/dL on recheck, call [phone] for further instructions. . Report your blood sugar to the short stay nurse when you get to Short Stay.  . If you are admitted to the hospital after surgery: o Your blood sugar will be checked by the staff and you will probably be given insulin after surgery (instead of oral diabetes medicines) to make sure you have good blood sugar levels. o The goal for blood sugar control after surgery is 80-180 mg/dL.   WHAT DO I DO ABOUT MY DIABETES MEDICATION?

## 2019-01-12 ENCOUNTER — Other Ambulatory Visit (HOSPITAL_COMMUNITY)
Admission: RE | Admit: 2019-01-12 | Discharge: 2019-01-12 | Disposition: A | Discharge status: Home / Self Care | Payer: Medicare Other | Source: Ambulatory Visit | Attending: Surgery | Admitting: Surgery

## 2019-01-12 DIAGNOSIS — Z01812 Encounter for preprocedural laboratory examination: Secondary | ICD-10-CM | POA: Diagnosis not present

## 2019-01-12 DIAGNOSIS — Z20828 Contact with and (suspected) exposure to other viral communicable diseases: Secondary | ICD-10-CM | POA: Diagnosis not present

## 2019-01-12 DIAGNOSIS — Z01818 Encounter for other preprocedural examination: Secondary | ICD-10-CM | POA: Diagnosis present

## 2019-01-14 ENCOUNTER — Encounter (HOSPITAL_COMMUNITY): Payer: Self-pay

## 2019-01-14 ENCOUNTER — Encounter (HOSPITAL_COMMUNITY)
Admission: RE | Admit: 2019-01-14 | Discharge: 2019-01-14 | Disposition: A | Discharge status: Home / Self Care | Payer: Medicare Other | Source: Ambulatory Visit | Attending: Surgery | Admitting: Surgery

## 2019-01-14 ENCOUNTER — Other Ambulatory Visit: Payer: Self-pay

## 2019-01-14 ENCOUNTER — Ambulatory Visit (HOSPITAL_COMMUNITY)
Admission: RE | Admit: 2019-01-14 | Discharge: 2019-01-14 | Disposition: A | Discharge status: Home / Self Care | Payer: Medicare Other | Source: Ambulatory Visit | Attending: Surgery | Admitting: Surgery

## 2019-01-14 DIAGNOSIS — R0602 Shortness of breath: Secondary | ICD-10-CM

## 2019-01-14 LAB — CBC
HCT: 45.3 % (ref 39.0–52.0)
Hemoglobin: 14.5 g/dL (ref 13.0–17.0)
MCH: 30.8 pg (ref 26.0–34.0)
MCHC: 32 g/dL (ref 30.0–36.0)
MCV: 96.2 fL (ref 80.0–100.0)
Platelets: 300 10*3/uL (ref 150–400)
RBC: 4.71 MIL/uL (ref 4.22–5.81)
RDW: 12.6 % (ref 11.5–15.5)
WBC: 7.7 10*3/uL (ref 4.0–10.5)
nRBC: 0 % (ref 0.0–0.2)

## 2019-01-14 LAB — BASIC METABOLIC PANEL
Anion gap: 10 (ref 5–15)
BUN: 15 mg/dL (ref 8–23)
CO2: 25 mmol/L (ref 22–32)
Calcium: 9.7 mg/dL (ref 8.9–10.3)
Chloride: 107 mmol/L (ref 98–111)
Creatinine, Ser: 0.74 mg/dL (ref 0.61–1.24)
GFR calc Af Amer: >60 mL/min (ref >60)
GFR calc non Af Amer: >60 mL/min (ref >60)
Glucose, Bld: 148 mg/dL — ABNORMAL HIGH (ref 70–99)
Potassium: 4.6 mmol/L (ref 3.5–5.1)
Sodium: 142 mmol/L (ref 135–145)

## 2019-01-14 LAB — GLUCOSE, CAPILLARY: Glucose-Capillary: 140 mg/dL — ABNORMAL HIGH (ref 70–99)

## 2019-01-14 LAB — HEMOGLOBIN A1C
Hgb A1c MFr Bld: 6.4 % — ABNORMAL HIGH (ref 4.8–5.6)
Mean Plasma Glucose: 136.98 mg/dL

## 2019-01-14 LAB — NOVEL CORONAVIRUS, NAA (HOSP ORDER, SEND-OUT TO REF LAB; TAT 18-24 HRS): SARS-CoV-2, NAA: NOT DETECTED

## 2019-01-14 NOTE — Consult Note (Signed)
[place] Nurse requested for preoperative stoma site marking  Discussed surgical procedure and stoma creation with patient. Explained role of the [place] nurse team.  Provided the patient with educational booklet and provided samples of pouching options.  Answered patient questions.  His mother had a colostomy and he is hoping to avoid that.  He is reluctant to even talk about it. Says he will not need one.  Discussed the importance of assessing the abdomen when sitting and standing to optimize stoma site. He is agreeable. Just in case.     Examined patient  sitting, and standing in order to place the marking in the patient's visual field, away from any creases or abdominal contour issues and within the rectus muscle. He has had a 40 pound weight loss and has some sagging abdominal skin below the umbilicus.  Wears his pants just below the umbilicus.  Will mark above the umbilicus for ease in viewing and self care.   Marked for colostomy in the LLQ  4 cm to the left of the umbilicus and 2 cm above the umbilicus.  Patient's abdomen cleansed with CHG wipes at site markings, allowed to air dry prior to marking.Covered mark with thin film transparent dressing to preserve mark until date of surgery.   [place] Nurse team will follow up with patient after surgery for continue ostomy care and teaching.  Domenic Moras MSN, RN, FNP-BC [facility]ON Wound, Ostomy, Continence Nurse Pager [phone]

## 2019-01-15 MED ORDER — SODIUM CHLORIDE 0.9 % IV SOLN
INTRAVENOUS | Status: DC
  Filled 2019-01-15: qty 6

## 2019-01-15 MED ORDER — BUPIVACAINE LIPOSOME 1.3 % IJ SUSP
20.0000 mL | INTRAMUSCULAR | Status: DC
  Filled 2019-01-15: qty 20

## 2019-01-15 NOTE — Progress Notes (Signed)
Anesthesia Chart Review   Case: [id number] Date/Time: 01/16/19 1215   Procedures:      XI ROBOTIC RESECTION OF SIGMOID COLON (N/A )     LYSIS OF ADHESIONS (N/A )     RIGID PROCTOSCOPY (N/A )   Anesthesia type: General   Pre-op diagnosis: DIVERTICULITIS   Location: [facility] / [facility]   Surgeon: [name], MD      DISCUSSION:68 y.o. former smoker with h/o GERD, sleep apnea w/o cpap, DM II, PVD, CAD (DES 08/2008, BMS 09/2006), aortobifemoral bypass 2003, diverticulitis scheduled for above procedure 01/16/2019 with Dr. [name].   Advised to hold Plavix 5 days before procedure, last dose 01/09/2019.   Last seen by cardiologist, Dr. Candee Furbish, via telemedicine 11/12/2018.  10/2010 and 2016-cardiac catheterization showing patent RCA stents after nuclear stress test showed inferior wall ischemia. Overall reassuring.Per OV note, "Waiting for surgery to take out area of perforation. November.  He may proceed with colonic surgery with moderate risk from a cardiovascular perspective based upon his prior CAD.  It is okay for him to hold his Plavix for 5 days prior to surgery.  I have also indicated that if he does decide to use fish oil that he holds this as well."   Anticipate pt can proceed with planned procedure barring acute status change.   VS: BP 135/61 (BP Location: Left Arm)   Pulse 70   Temp 36.7 C (Oral)   Resp 18   Ht 5\' 8"  (1.727 m)   Wt 83.2 kg   SpO2 100%   BMI 27.89 kg/m   PROVIDERS: [name], MD is PCP   Candee Furbish, MD is Cardiologist  LABS: Labs reviewed: Acceptable for surgery. (all labs ordered are listed, but only abnormal results are displayed)  Labs Reviewed  GLUCOSE, CAPILLARY - Abnormal; Notable for the following components:      Result Value   Glucose-Capillary 140 (*)    All other components within normal limits  HEMOGLOBIN A1C - Abnormal; Notable for the following components:   Hgb A1c MFr Bld 6.4 (*)    All other components within normal  limits  BASIC METABOLIC PANEL - Abnormal; Notable for the following components:   Glucose, Bld 148 (*)    All other components within normal limits  CBC     IMAGES: Chest Xray 01/14/2019 FINDINGS: Lungs are clear. Heart size and pulmonary vascularity are normal. No adenopathy. There is mild degenerative change in the thoracic spine.  IMPRESSION: No edema or consolidation.  EKG: 12/28/2018 Rate 95 bpm Sinus rhythm  Ventricular premature complex Increased rate and ectopy compared with prior 9/20  CV: Echo 09/28/2018 IMPRESSIONS    1. The left ventricle has normal systolic function with an ejection fraction of 60-65%. The cavity size was normal. There is mildly increased left ventricular wall thickness. Left ventricular diastolic Doppler parameters are consistent with impaired  relaxation.  2. The right ventricle has normal systolic function. The cavity was normal. There is no increase in right ventricular wall thickness. Right ventricular systolic pressure could not be assessed.  3. The aortic valve is tricuspid. Moderate sclerosis of the aortic valve. No stenosis of the aortic valve. Past Medical History:  Diagnosis Date  . Arthritis   . Bronchitis   . Chronic back pain   . Complication of anesthesia    difficulty breathing s/p back surgery at [facility] 2013  . Coronary artery disease   . Diabetes mellitus without complication ([place])   . Dry  skin    in the back of head-left side  . GERD (gastroesophageal reflux disease)   . H/O hiatal hernia   . Hypercholesteremia   . Peripheral vascular disease ([place])   . Pneumonia   . Shortness of breath   . Sleep apnea    has CPAP but does not use  . Unstable angina ([place]) 12/05/2014    Past Surgical History:  Procedure Laterality Date  . AORTA - BILATERAL FEMORAL ARTERY BYPASS GRAFT  2003   Dr Curt Jews  . BACK SURGERY  2004  . CARDIAC CATHETERIZATION  2010   x 2 stents RCA  . CARDIAC CATHETERIZATION N/A 12/08/2014    Procedure: Left Heart Cath and Coronary Angiography;  Surgeon: Lorretta Harp, MD;  Location: [facility];  Service: Cardiovascular;  Laterality: N/A;  . COLONOSCOPY  08/04/2009  . CORONARY ANGIOPLASTY     2 stents  . FOOT SURGERY  1980s   right foot  . SHOULDER ARTHROSCOPY WITH SUBACROMIAL DECOMPRESSION Right 03/28/2013   Procedure: RIGHT SHOULDER ARTHROSCOPY WITH SUBACROMIAL DECOMPRESSION AND DISTAL CLAVICLE RESECTION;  Surgeon: Marin Shutter, MD;  Location: [facility];  Service: Orthopedics;  Laterality: Right;  . SHOULDER SURGERY     left shoulder rotator cuff repair  . UPPER GI ENDOSCOPY      MEDICATIONS: . aspirin EC 81 MG tablet  . atorvastatin (LIPITOR) 80 MG tablet  . clopidogrel (PLAVIX) 75 MG tablet  . fenofibrate 160 MG tablet  . hydrocortisone (ANUSOL-HC) 2.5 % rectal cream  . magnesium oxide (MAG-OX) 400 (241.3 Mg) MG tablet  . meropenem (MERREM) IVPB  . metoprolol succinate (TOPROL-XL) 25 MG 24 hr tablet  . niacin (NIASPAN) 750 MG CR tablet  . nitroGLYCERIN (NITROSTAT) 0.4 MG SL tablet  . ondansetron (ZOFRAN-ODT) 4 MG disintegrating tablet  . pantoprazole (PROTONIX) 40 MG tablet  . polyethylene glycol (MIRALAX / GLYCOLAX) 17 g packet   No current facility-administered medications for this encounter.    Derrill Memo ON 01/16/2019] bupivacaine liposome (EXPAREL) 1.3 % injection 266 mg  . [START ON 01/16/2019] clindamycin (CLEOCIN) 900 mg, gentamicin (GARAMYCIN) 240 mg in sodium chloride 0.9 % 1,000 mL for intraperitoneal lavage

## 2019-01-15 NOTE — Anesthesia Preprocedure Evaluation (Addendum)
Anesthesia Evaluation  Patient identified by MRN, date of birth, ID band Patient awake    Reviewed: Allergy & Precautions, NPO status , Patient's Chart, lab work & pertinent test results  Airway Mallampati: II  TM Distance: >3 FB Neck ROM: Full    Dental no notable dental hx.    Pulmonary sleep apnea , former smoker,    Pulmonary exam normal breath sounds clear to auscultation       Cardiovascular + CAD and + Cardiac Stents  Normal cardiovascular exam Rhythm:Regular Rate:Normal     Neuro/Psych negative neurological ROS  negative psych ROS   GI/Hepatic Neg liver ROS, GERD  ,  Endo/Other  negative endocrine ROSdiabetes  Renal/GU negative Renal ROS  negative genitourinary   Musculoskeletal negative musculoskeletal ROS (+)   Abdominal   Peds negative pediatric ROS (+)  Hematology negative hematology ROS (+)   Anesthesia Other Findings   Reproductive/Obstetrics negative OB ROS                            Anesthesia Physical Anesthesia Plan  ASA: III  Anesthesia Plan: General   Post-op Pain Management:    Induction: Intravenous  PONV Risk Score and Plan: 2 and Ondansetron, Treatment may vary due to age or medical condition and Dexamethasone  Airway Management Planned: Oral ETT  Additional Equipment:   Intra-op Plan:   Post-operative Plan: Extubation in OR  Informed Consent: I have reviewed the patients History and Physical, chart, labs and discussed the procedure including the risks, benefits and alternatives for the proposed anesthesia with the patient or authorized representative who has indicated his/her understanding and acceptance.     Dental advisory given  Plan Discussed with: CRNA and Surgeon  Anesthesia Plan Comments: (See PAT note 01/14/2019, Konrad Felix, PA-C)       Anesthesia Quick Evaluation

## 2019-01-16 ENCOUNTER — Inpatient Hospital Stay (HOSPITAL_COMMUNITY): Payer: Medicare Other | Admitting: Physician Assistant

## 2019-01-16 ENCOUNTER — Encounter (HOSPITAL_COMMUNITY): Payer: Self-pay | Admitting: *Deleted

## 2019-01-16 ENCOUNTER — Inpatient Hospital Stay (HOSPITAL_COMMUNITY)
Admission: RE | Admit: 2019-01-16 | Discharge: 2019-01-19 | DRG: 329 | Disposition: A | Discharge status: Home / Self Care | Payer: Medicare Other | Attending: Surgery | Admitting: Surgery

## 2019-01-16 ENCOUNTER — Inpatient Hospital Stay (HOSPITAL_COMMUNITY): Payer: Medicare Other | Admitting: Anesthesiology

## 2019-01-16 ENCOUNTER — Other Ambulatory Visit: Payer: Self-pay

## 2019-01-16 ENCOUNTER — Encounter (HOSPITAL_COMMUNITY)
Admission: RE | Disposition: A | Discharge status: Home / Self Care | Payer: Self-pay | Source: Home / Self Care | Attending: Surgery

## 2019-01-16 DIAGNOSIS — G8929 Other chronic pain: Secondary | ICD-10-CM | POA: Diagnosis present

## 2019-01-16 DIAGNOSIS — G4733 Obstructive sleep apnea (adult) (pediatric): Secondary | ICD-10-CM | POA: Diagnosis present

## 2019-01-16 DIAGNOSIS — Z955 Presence of coronary angioplasty implant and graft: Secondary | ICD-10-CM

## 2019-01-16 DIAGNOSIS — E669 Obesity, unspecified: Secondary | ICD-10-CM | POA: Diagnosis present

## 2019-01-16 DIAGNOSIS — D649 Anemia, unspecified: Secondary | ICD-10-CM | POA: Diagnosis not present

## 2019-01-16 DIAGNOSIS — K56609 Unspecified intestinal obstruction, unspecified as to partial versus complete obstruction: Secondary | ICD-10-CM | POA: Diagnosis present

## 2019-01-16 DIAGNOSIS — Z8249 Family history of ischemic heart disease and other diseases of the circulatory system: Secondary | ICD-10-CM

## 2019-01-16 DIAGNOSIS — M549 Dorsalgia, unspecified: Secondary | ICD-10-CM | POA: Diagnosis present

## 2019-01-16 DIAGNOSIS — Z808 Family history of malignant neoplasm of other organs or systems: Secondary | ICD-10-CM | POA: Diagnosis not present

## 2019-01-16 DIAGNOSIS — K5720 Diverticulitis of large intestine with perforation and abscess without bleeding: Secondary | ICD-10-CM | POA: Diagnosis present

## 2019-01-16 DIAGNOSIS — Z95828 Presence of other vascular implants and grafts: Secondary | ICD-10-CM

## 2019-01-16 DIAGNOSIS — R55 Syncope and collapse: Secondary | ICD-10-CM | POA: Diagnosis not present

## 2019-01-16 DIAGNOSIS — Z20828 Contact with and (suspected) exposure to other viral communicable diseases: Secondary | ICD-10-CM | POA: Diagnosis present

## 2019-01-16 DIAGNOSIS — Z800 Family history of malignant neoplasm of digestive organs: Secondary | ICD-10-CM | POA: Diagnosis not present

## 2019-01-16 DIAGNOSIS — K219 Gastro-esophageal reflux disease without esophagitis: Secondary | ICD-10-CM | POA: Diagnosis present

## 2019-01-16 DIAGNOSIS — E1151 Type 2 diabetes mellitus with diabetic peripheral angiopathy without gangrene: Secondary | ICD-10-CM | POA: Diagnosis present

## 2019-01-16 DIAGNOSIS — Z87891 Personal history of nicotine dependence: Secondary | ICD-10-CM | POA: Diagnosis not present

## 2019-01-16 DIAGNOSIS — E7800 Pure hypercholesterolemia, unspecified: Secondary | ICD-10-CM | POA: Diagnosis present

## 2019-01-16 DIAGNOSIS — I2510 Atherosclerotic heart disease of native coronary artery without angina pectoris: Secondary | ICD-10-CM | POA: Diagnosis present

## 2019-01-16 DIAGNOSIS — H8110 Benign paroxysmal vertigo, unspecified ear: Secondary | ICD-10-CM | POA: Diagnosis present

## 2019-01-16 DIAGNOSIS — Z6827 Body mass index (BMI) 27.0-27.9, adult: Secondary | ICD-10-CM

## 2019-01-16 DIAGNOSIS — Z79899 Other long term (current) drug therapy: Secondary | ICD-10-CM

## 2019-01-16 DIAGNOSIS — Z7902 Long term (current) use of antithrombotics/antiplatelets: Secondary | ICD-10-CM | POA: Diagnosis not present

## 2019-01-16 DIAGNOSIS — E876 Hypokalemia: Secondary | ICD-10-CM | POA: Diagnosis present

## 2019-01-16 DIAGNOSIS — I10 Essential (primary) hypertension: Secondary | ICD-10-CM | POA: Diagnosis present

## 2019-01-16 DIAGNOSIS — K651 Peritoneal abscess: Secondary | ICD-10-CM | POA: Diagnosis present

## 2019-01-16 DIAGNOSIS — Z7982 Long term (current) use of aspirin: Secondary | ICD-10-CM

## 2019-01-16 DIAGNOSIS — E440 Moderate protein-calorie malnutrition: Secondary | ICD-10-CM | POA: Diagnosis present

## 2019-01-16 DIAGNOSIS — Z79891 Long term (current) use of opiate analgesic: Secondary | ICD-10-CM | POA: Diagnosis not present

## 2019-01-16 DIAGNOSIS — E785 Hyperlipidemia, unspecified: Secondary | ICD-10-CM | POA: Diagnosis present

## 2019-01-16 DIAGNOSIS — Z7901 Long term (current) use of anticoagulants: Secondary | ICD-10-CM

## 2019-01-16 DIAGNOSIS — M5442 Lumbago with sciatica, left side: Secondary | ICD-10-CM | POA: Diagnosis present

## 2019-01-16 HISTORY — PX: PROCTOSCOPY: SHX2266

## 2019-01-16 HISTORY — PX: XI ROBOTIC ASSISTED LOWER ANTERIOR RESECTION: SHX6558

## 2019-01-16 HISTORY — PX: LYSIS OF ADHESION: SHX5961

## 2019-01-16 LAB — GLUCOSE, CAPILLARY
Glucose-Capillary: 117 mg/dL — ABNORMAL HIGH (ref 70–99)
Glucose-Capillary: 166 mg/dL — ABNORMAL HIGH (ref 70–99)

## 2019-01-16 SURGERY — RESECTION, RECTUM, LOW ANTERIOR, ROBOT-ASSISTED
Anesthesia: General

## 2019-01-16 MED ORDER — BUPIVACAINE-EPINEPHRINE (PF) 0.25% -1:200000 IJ SOLN
INTRAMUSCULAR | Status: DC | PRN
  Administered 2019-01-16: 80 mL

## 2019-01-16 MED ORDER — BUPIVACAINE-EPINEPHRINE 0.25% -1:200000 IJ SOLN
INTRAMUSCULAR | Status: AC
  Filled 2019-01-16: qty 1

## 2019-01-16 MED ORDER — METOPROLOL SUCCINATE ER 25 MG PO TB24
25.0000 mg | ORAL_TABLET | Freq: Every day | ORAL | Status: DC
  Administered 2019-01-16: 11:00:00 25 mg via ORAL
  Filled 2019-01-16: qty 1

## 2019-01-16 MED ORDER — DIPHENHYDRAMINE HCL 50 MG/ML IJ SOLN: 12.5000 mg | Freq: Four times a day (QID) | INTRAMUSCULAR | Status: DC | PRN

## 2019-01-16 MED ORDER — KETAMINE HCL 10 MG/ML IJ SOLN
INTRAMUSCULAR | Status: DC | PRN
  Administered 2019-01-16: 30 mg via INTRAVENOUS

## 2019-01-16 MED ORDER — FENOFIBRATE 160 MG PO TABS
160.0000 mg | ORAL_TABLET | Freq: Every day | ORAL | Status: DC
  Administered 2019-01-16 – 2019-01-18 (×3): 160 mg via ORAL
  Filled 2019-01-16 (×3): qty 1

## 2019-01-16 MED ORDER — POLYETHYLENE GLYCOL 3350 17 GM/SCOOP PO POWD: 1.0000 | Freq: Once | ORAL | Status: DC

## 2019-01-16 MED ORDER — SODIUM CHLORIDE 0.9 % IV SOLN
2.0000 g | Freq: Two times a day (BID) | INTRAVENOUS | Status: AC
  Administered 2019-01-17: 2 g via INTRAVENOUS
  Filled 2019-01-16: qty 2

## 2019-01-16 MED ORDER — SUCCINYLCHOLINE CHLORIDE 20 MG/ML IJ SOLN
INTRAMUSCULAR | Status: DC | PRN
  Administered 2019-01-16: 120 mg via INTRAVENOUS

## 2019-01-16 MED ORDER — PROPOFOL 10 MG/ML IV BOLUS
INTRAVENOUS | Status: AC
  Filled 2019-01-16: qty 20

## 2019-01-16 MED ORDER — METOPROLOL TARTRATE 12.5 MG HALF TABLET
12.5000 mg | ORAL_TABLET | Freq: Two times a day (BID) | ORAL | Status: DC
  Administered 2019-01-16 – 2019-01-19 (×5): 12.5 mg via ORAL
  Filled 2019-01-16 (×6): qty 1

## 2019-01-16 MED ORDER — ALUM & MAG HYDROXIDE-SIMETH 200-200-20 MG/5ML PO SUSP: 30.0000 mL | Freq: Four times a day (QID) | ORAL | Status: DC | PRN

## 2019-01-16 MED ORDER — FENTANYL CITRATE (PF) 250 MCG/5ML IJ SOLN
INTRAMUSCULAR | Status: AC
  Filled 2019-01-16: qty 5

## 2019-01-16 MED ORDER — PROPOFOL 10 MG/ML IV BOLUS
INTRAVENOUS | Status: DC | PRN
  Administered 2019-01-16: 140 mg via INTRAVENOUS

## 2019-01-16 MED ORDER — SUGAMMADEX SODIUM 200 MG/2ML IV SOLN
INTRAVENOUS | Status: DC | PRN
  Administered 2019-01-16: 200 mg via INTRAVENOUS

## 2019-01-16 MED ORDER — FENTANYL CITRATE (PF) 100 MCG/2ML IJ SOLN: 25.0000 ug | INTRAMUSCULAR | Status: DC | PRN

## 2019-01-16 MED ORDER — SODIUM CHLORIDE 0.9 % IV SOLN
2.0000 g | INTRAVENOUS | Status: AC
  Administered 2019-01-16: 13:00:00 2 g via INTRAVENOUS
  Filled 2019-01-16: qty 2

## 2019-01-16 MED ORDER — TRAMADOL HCL 50 MG PO TABS
50.0000 mg | ORAL_TABLET | Freq: Four times a day (QID) | ORAL | Status: DC | PRN
  Administered 2019-01-17 – 2019-01-18 (×2): 100 mg via ORAL
  Filled 2019-01-16 (×2): qty 2

## 2019-01-16 MED ORDER — LACTATED RINGERS IV SOLN
INTRAVENOUS | Status: AC
  Administered 2019-01-16: 17:00:00 via INTRAVENOUS

## 2019-01-16 MED ORDER — ENSURE SURGERY PO LIQD
237.0000 mL | Freq: Two times a day (BID) | ORAL | Status: DC
  Administered 2019-01-18 – 2019-01-19 (×3): 237 mL via ORAL
  Filled 2019-01-16 (×6): qty 237

## 2019-01-16 MED ORDER — MAGNESIUM OXIDE 400 (241.3 MG) MG PO TABS
400.0000 mg | ORAL_TABLET | Freq: Every day | ORAL | Status: DC
  Administered 2019-01-17 – 2019-01-19 (×3): 400 mg via ORAL
  Filled 2019-01-16 (×3): qty 1

## 2019-01-16 MED ORDER — PANTOPRAZOLE SODIUM 40 MG PO TBEC
40.0000 mg | DELAYED_RELEASE_TABLET | Freq: Every day | ORAL | Status: DC
  Administered 2019-01-17 – 2019-01-18 (×2): 40 mg via ORAL
  Filled 2019-01-16 (×3): qty 1

## 2019-01-16 MED ORDER — PROMETHAZINE HCL 25 MG/ML IJ SOLN: 6.2500 mg | INTRAMUSCULAR | Status: DC | PRN

## 2019-01-16 MED ORDER — INDOCYANINE GREEN 25 MG IV SOLR
INTRAVENOUS | Status: DC | PRN
  Administered 2019-01-16: 7.5 mg via INTRAVENOUS

## 2019-01-16 MED ORDER — KETOROLAC TROMETHAMINE 15 MG/ML IJ SOLN
INTRAMUSCULAR | Status: DC | PRN
  Administered 2019-01-16: 15 mg via INTRAVENOUS

## 2019-01-16 MED ORDER — ENOXAPARIN SODIUM 40 MG/0.4ML ~~LOC~~ SOLN
40.0000 mg | SUBCUTANEOUS | Status: DC
  Administered 2019-01-17: 08:00:00 40 mg via SUBCUTANEOUS
  Filled 2019-01-16 (×2): qty 0.4

## 2019-01-16 MED ORDER — ONDANSETRON HCL 4 MG/2ML IJ SOLN
INTRAMUSCULAR | Status: AC
  Filled 2019-01-16: qty 2

## 2019-01-16 MED ORDER — METOPROLOL TARTRATE 5 MG/5ML IV SOLN: 5.0000 mg | Freq: Four times a day (QID) | INTRAVENOUS | Status: DC | PRN

## 2019-01-16 MED ORDER — ACETAMINOPHEN 500 MG PO TABS
1000.0000 mg | ORAL_TABLET | ORAL | Status: AC
  Administered 2019-01-16: 1000 mg via ORAL
  Filled 2019-01-16: qty 2

## 2019-01-16 MED ORDER — PROCHLORPERAZINE MALEATE 10 MG PO TABS: 10.0000 mg | ORAL_TABLET | Freq: Four times a day (QID) | ORAL | Status: DC | PRN

## 2019-01-16 MED ORDER — ALVIMOPAN 12 MG PO CAPS
12.0000 mg | ORAL_CAPSULE | ORAL | Status: AC
  Administered 2019-01-16: 12 mg via ORAL
  Filled 2019-01-16: qty 1

## 2019-01-16 MED ORDER — METRONIDAZOLE 500 MG PO TABS: 1000.0000 mg | ORAL_TABLET | ORAL | Status: DC

## 2019-01-16 MED ORDER — MIDAZOLAM HCL 2 MG/2ML IJ SOLN
INTRAMUSCULAR | Status: AC
  Filled 2019-01-16: qty 2

## 2019-01-16 MED ORDER — GABAPENTIN 300 MG PO CAPS
300.0000 mg | ORAL_CAPSULE | ORAL | Status: AC
  Administered 2019-01-16: 11:00:00 300 mg via ORAL
  Filled 2019-01-16: qty 1

## 2019-01-16 MED ORDER — SODIUM CHLORIDE 0.9 % IV SOLN
Freq: Three times a day (TID) | INTRAVENOUS | Status: AC | PRN
  Administered 2019-01-17: 14:00:00 via INTRAVENOUS

## 2019-01-16 MED ORDER — LIDOCAINE 2% (20 MG/ML) 5 ML SYRINGE
INTRAMUSCULAR | Status: DC | PRN
  Administered 2019-01-16: 100 mg via INTRAVENOUS

## 2019-01-16 MED ORDER — ONDANSETRON HCL 4 MG PO TABS: 4.0000 mg | ORAL_TABLET | Freq: Four times a day (QID) | ORAL | Status: DC | PRN

## 2019-01-16 MED ORDER — DIPHENHYDRAMINE HCL 12.5 MG/5ML PO ELIX: 12.5000 mg | ORAL_SOLUTION | Freq: Four times a day (QID) | ORAL | Status: DC | PRN

## 2019-01-16 MED ORDER — ONDANSETRON HCL 4 MG/2ML IJ SOLN
INTRAMUSCULAR | Status: DC | PRN
  Administered 2019-01-16: 4 mg via INTRAVENOUS

## 2019-01-16 MED ORDER — FENTANYL CITRATE (PF) 100 MCG/2ML IJ SOLN
INTRAMUSCULAR | Status: DC | PRN
  Administered 2019-01-16: 100 ug via INTRAVENOUS
  Administered 2019-01-16 (×4): 50 ug via INTRAVENOUS
  Administered 2019-01-16: 100 ug via INTRAVENOUS
  Administered 2019-01-16: 50 ug via INTRAVENOUS

## 2019-01-16 MED ORDER — BUPIVACAINE LIPOSOME 1.3 % IJ SUSP
INTRAMUSCULAR | Status: DC | PRN
  Administered 2019-01-16: 20 mL

## 2019-01-16 MED ORDER — LIDOCAINE 2% (20 MG/ML) 5 ML SYRINGE
INTRAMUSCULAR | Status: DC | PRN
  Administered 2019-01-16: 5.1 mL/h via INTRAVENOUS

## 2019-01-16 MED ORDER — ACETAMINOPHEN 500 MG PO TABS
1000.0000 mg | ORAL_TABLET | Freq: Four times a day (QID) | ORAL | Status: DC
  Administered 2019-01-16 – 2019-01-19 (×9): 1000 mg via ORAL
  Filled 2019-01-16 (×9): qty 2

## 2019-01-16 MED ORDER — ALVIMOPAN 12 MG PO CAPS
12.0000 mg | ORAL_CAPSULE | Freq: Two times a day (BID) | ORAL | Status: DC
  Administered 2019-01-17 – 2019-01-18 (×3): 12 mg via ORAL
  Filled 2019-01-16 (×3): qty 1

## 2019-01-16 MED ORDER — DEXAMETHASONE SODIUM PHOSPHATE 10 MG/ML IJ SOLN
INTRAMUSCULAR | Status: DC | PRN
  Administered 2019-01-16: 10 mg via INTRAVENOUS

## 2019-01-16 MED ORDER — ASPIRIN EC 81 MG PO TBEC
81.0000 mg | DELAYED_RELEASE_TABLET | Freq: Every day | ORAL | Status: DC
  Administered 2019-01-17: 22:00:00 81 mg via ORAL
  Filled 2019-01-16 (×2): qty 1

## 2019-01-16 MED ORDER — LACTATED RINGERS IV SOLN
INTRAVENOUS | Status: DC
  Administered 2019-01-16 (×3): via INTRAVENOUS

## 2019-01-16 MED ORDER — NITROGLYCERIN 0.4 MG SL SUBL: 0.4000 mg | SUBLINGUAL_TABLET | SUBLINGUAL | Status: DC | PRN

## 2019-01-16 MED ORDER — ENOXAPARIN SODIUM 40 MG/0.4ML ~~LOC~~ SOLN
40.0000 mg | Freq: Once | SUBCUTANEOUS | Status: AC
  Administered 2019-01-16: 11:00:00 40 mg via SUBCUTANEOUS
  Filled 2019-01-16: qty 0.4

## 2019-01-16 MED ORDER — NIACIN ER (ANTIHYPERLIPIDEMIC) 500 MG PO TBCR
1500.0000 mg | EXTENDED_RELEASE_TABLET | Freq: Every day | ORAL | Status: DC
  Administered 2019-01-16 – 2019-01-18 (×3): 1500 mg via ORAL
  Filled 2019-01-16 (×4): qty 3

## 2019-01-16 MED ORDER — ROCURONIUM BROMIDE 10 MG/ML (PF) SYRINGE
PREFILLED_SYRINGE | INTRAVENOUS | Status: DC | PRN
  Administered 2019-01-16: 20 mg via INTRAVENOUS
  Administered 2019-01-16: 10 mg via INTRAVENOUS
  Administered 2019-01-16: 40 mg via INTRAVENOUS

## 2019-01-16 MED ORDER — ATORVASTATIN CALCIUM 20 MG PO TABS
80.0000 mg | ORAL_TABLET | Freq: Every day | ORAL | Status: DC
  Administered 2019-01-16 – 2019-01-18 (×3): 80 mg via ORAL
  Filled 2019-01-16 (×3): qty 4

## 2019-01-16 MED ORDER — HYDROMORPHONE HCL 1 MG/ML IJ SOLN
0.5000 mg | INTRAMUSCULAR | Status: DC | PRN
  Administered 2019-01-18: 10:00:00 1 mg via INTRAVENOUS
  Filled 2019-01-16: qty 1

## 2019-01-16 MED ORDER — BISACODYL 5 MG PO TBEC: 20.0000 mg | DELAYED_RELEASE_TABLET | Freq: Once | ORAL | Status: DC

## 2019-01-16 MED ORDER — PHENYLEPHRINE 40 MCG/ML (10ML) SYRINGE FOR IV PUSH (FOR BLOOD PRESSURE SUPPORT)
PREFILLED_SYRINGE | INTRAVENOUS | Status: DC | PRN
  Administered 2019-01-16: 120 ug via INTRAVENOUS

## 2019-01-16 MED ORDER — ONDANSETRON HCL 4 MG/2ML IJ SOLN: 4.0000 mg | Freq: Four times a day (QID) | INTRAMUSCULAR | Status: DC | PRN

## 2019-01-16 MED ORDER — GABAPENTIN 100 MG PO CAPS
200.0000 mg | ORAL_CAPSULE | Freq: Three times a day (TID) | ORAL | Status: DC
  Administered 2019-01-16 – 2019-01-19 (×8): 200 mg via ORAL
  Filled 2019-01-16 (×8): qty 2

## 2019-01-16 MED ORDER — NEOMYCIN SULFATE 500 MG PO TABS: 1000.0000 mg | ORAL_TABLET | ORAL | Status: DC

## 2019-01-16 MED ORDER — TRAMADOL HCL 50 MG PO TABS: 50.0000 mg | ORAL_TABLET | Freq: Four times a day (QID) | ORAL | 0 refills | Status: DC | PRN

## 2019-01-16 MED ORDER — PROCHLORPERAZINE EDISYLATE 10 MG/2ML IJ SOLN: 5.0000 mg | Freq: Four times a day (QID) | INTRAMUSCULAR | Status: DC | PRN

## 2019-01-16 MED ORDER — MIDAZOLAM HCL 5 MG/5ML IJ SOLN
INTRAMUSCULAR | Status: DC | PRN
  Administered 2019-01-16: 2 mg via INTRAVENOUS

## 2019-01-16 MED ORDER — ENALAPRILAT 1.25 MG/ML IV SOLN
0.6250 mg | Freq: Four times a day (QID) | INTRAVENOUS | Status: DC | PRN
  Filled 2019-01-16: qty 1

## 2019-01-16 MED ORDER — LACTATED RINGERS IR SOLN
Status: DC | PRN
  Administered 2019-01-16: 1000 mL

## 2019-01-16 SURGICAL SUPPLY — 112 items
APL PRP STRL LF DISP 70% ISPRP (MISCELLANEOUS) ×1
APPLIER CLIP 5 13 M/L LIGAMAX5 (MISCELLANEOUS)
APPLIER CLIP ROT 10 11.4 M/L (STAPLE)
APR CLP MED LRG 11.4X10 (STAPLE)
APR CLP MED LRG 5 ANG JAW (MISCELLANEOUS)
BLADE EXTENDED COATED 6.5IN (ELECTRODE) ×2 IMPLANT
CANNULA REDUC XI 12-8 STAPL (CANNULA) ×1
CANNULA REDUCER 12-8 DVNC XI (CANNULA) ×1 IMPLANT
CELLS DAT CNTRL 66122 CELL SVR (MISCELLANEOUS) IMPLANT
CHLORAPREP W/TINT 26 (MISCELLANEOUS) ×2 IMPLANT
CLIP APPLIE 5 13 M/L LIGAMAX5 (MISCELLANEOUS) IMPLANT
CLIP APPLIE ROT 10 11.4 M/L (STAPLE) IMPLANT
CLIP VESOLOCK LG 6/CT PURPLE (CLIP) IMPLANT
CLIP VESOLOCK MED LG 6/CT (CLIP) IMPLANT
COVER SURGICAL LIGHT HANDLE (MISCELLANEOUS) ×4 IMPLANT
COVER TIP SHEARS 8 DVNC (MISCELLANEOUS) ×1 IMPLANT
COVER TIP SHEARS 8MM DA VINCI (MISCELLANEOUS) ×1
COVER WAND RF STERILE (DRAPES) IMPLANT
DECANTER SPIKE VIAL GLASS SM (MISCELLANEOUS) ×2 IMPLANT
DEVICE TROCAR PUNCTURE CLOSURE (ENDOMECHANICALS) IMPLANT
DRAIN CHANNEL 19F RND (DRAIN) ×2 IMPLANT
DRAPE ARM DVNC X/XI (DISPOSABLE) ×4 IMPLANT
DRAPE COLUMN DVNC XI (DISPOSABLE) ×1 IMPLANT
DRAPE DA VINCI XI ARM (DISPOSABLE) ×4
DRAPE DA VINCI XI COLUMN (DISPOSABLE) ×1
DRAPE SURG IRRIG POUCH 19X23 (DRAPES) ×2 IMPLANT
DRSG OPSITE POSTOP 4X10 (GAUZE/BANDAGES/DRESSINGS) IMPLANT
DRSG OPSITE POSTOP 4X6 (GAUZE/BANDAGES/DRESSINGS) IMPLANT
DRSG OPSITE POSTOP 4X8 (GAUZE/BANDAGES/DRESSINGS) IMPLANT
DRSG TEGADERM 2-3/8X2-3/4 SM (GAUZE/BANDAGES/DRESSINGS) ×10 IMPLANT
DRSG TEGADERM 4X4.75 (GAUZE/BANDAGES/DRESSINGS) ×2 IMPLANT
ELECT REM PT RETURN 15FT ADLT (MISCELLANEOUS) ×2 IMPLANT
ENDOLOOP SUT PDS II  0 18 (SUTURE)
ENDOLOOP SUT PDS II 0 18 (SUTURE) IMPLANT
EVACUATOR SILICONE 100CC (DRAIN) ×2 IMPLANT
GAUZE SPONGE 2X2 8PLY STRL LF (GAUZE/BANDAGES/DRESSINGS) ×1 IMPLANT
GAUZE SPONGE 4X4 12PLY STRL (GAUZE/BANDAGES/DRESSINGS) IMPLANT
GLOVE ECLIPSE 8.0 STRL XLNG CF (GLOVE) ×10 IMPLANT
GLOVE INDICATOR 8.0 STRL GRN (GLOVE) ×10 IMPLANT
GOWN STRL REUS W/TWL XL LVL3 (GOWN DISPOSABLE) ×11 IMPLANT
GRASPER SUT TROCAR 14GX15 (MISCELLANEOUS) ×1 IMPLANT
HOLDER FOLEY CATH W/STRAP (MISCELLANEOUS) ×2 IMPLANT
IRRIG SUCT STRYKERFLOW 2 WTIP (MISCELLANEOUS) ×2
IRRIGATION SUCT STRKRFLW 2 WTP (MISCELLANEOUS) IMPLANT
KIT PROCEDURE DA VINCI SI (MISCELLANEOUS)
KIT PROCEDURE DVNC SI (MISCELLANEOUS) IMPLANT
KIT TURNOVER KIT A (KITS) ×1 IMPLANT
NDL INSUFFLATION 14GA 120MM (NEEDLE) ×1 IMPLANT
NEEDLE INSUFFLATION 14GA 120MM (NEEDLE) ×2 IMPLANT
PACK CARDIOVASCULAR III (CUSTOM PROCEDURE TRAY) ×2 IMPLANT
PACK COLON (CUSTOM PROCEDURE TRAY) ×2 IMPLANT
PAD POSITIONING PINK XL (MISCELLANEOUS) ×2 IMPLANT
PENCIL SMOKE EVACUATOR (MISCELLANEOUS) IMPLANT
PORT LAP GEL ALEXIS MED 5-9CM (MISCELLANEOUS) ×2 IMPLANT
PROTECTOR NERVE ULNAR (MISCELLANEOUS) ×4 IMPLANT
RELOAD STAPLE 45 BLU REG DVNC (STAPLE) IMPLANT
RELOAD STAPLE 45 GRN THCK DVNC (STAPLE) IMPLANT
RELOAD STAPLE 60 4.3 GRN DVNC (STAPLE) IMPLANT
RELOAD STAPLER 4.3X60 GRN DVNC (STAPLE) ×1 IMPLANT
RETRACTOR WND ALEXIS 18 MED (MISCELLANEOUS) IMPLANT
RTRCTR WOUND ALEXIS 18CM MED (MISCELLANEOUS)
SCISSORS LAP 5X35 DISP (ENDOMECHANICALS) ×2 IMPLANT
SEAL CANN UNIV 5-8 DVNC XI (MISCELLANEOUS) ×4 IMPLANT
SEAL XI 5MM-8MM UNIVERSAL (MISCELLANEOUS) ×4
SEALER VESSEL DA VINCI XI (MISCELLANEOUS) ×1
SEALER VESSEL EXT DVNC XI (MISCELLANEOUS) ×1 IMPLANT
SLEEVE ADV FIXATION 5X100MM (TROCAR) IMPLANT
SOLUTION ELECTROLUBE (MISCELLANEOUS) ×2 IMPLANT
SPONGE GAUZE 2X2 STER 10/PKG (GAUZE/BANDAGES/DRESSINGS) ×1
STAPLER 45 BLU RELOAD XI (STAPLE) IMPLANT
STAPLER 45 BLUE RELOAD XI (STAPLE)
STAPLER 45 GREEN RELOAD XI (STAPLE)
STAPLER 45 GRN RELOAD XI (STAPLE) IMPLANT
STAPLER 60 DA VINCI SURE FORM (STAPLE) ×1
STAPLER 60 SUREFORM DVNC (STAPLE) IMPLANT
STAPLER CANNULA SEAL DVNC XI (STAPLE) ×1 IMPLANT
STAPLER CANNULA SEAL XI (STAPLE)
STAPLER CIRC CVD 29MM 37CM (STAPLE) ×1 IMPLANT
STAPLER RELOAD 4.3X60 GREEN (STAPLE) ×1
STAPLER RELOAD 4.3X60 GRN DVNC (STAPLE) ×1
STAPLER SHEATH (SHEATH) ×1
STAPLER SHEATH ENDOWRIST DVNC (SHEATH) ×1 IMPLANT
STOPCOCK 4 WAY LG BORE MALE ST (IV SETS) ×4 IMPLANT
SURGILUBE 2OZ TUBE FLIPTOP (MISCELLANEOUS) ×2 IMPLANT
SUT MNCRL AB 4-0 PS2 18 (SUTURE) ×2 IMPLANT
SUT PDS AB 1 CT1 27 (SUTURE) ×4 IMPLANT
SUT PDS AB 1 TP1 96 (SUTURE) IMPLANT
SUT PROLENE 0 CT 2 (SUTURE) IMPLANT
SUT PROLENE 2 0 BLUE (SUTURE) ×1 IMPLANT
SUT PROLENE 2 0 KS (SUTURE) IMPLANT
SUT PROLENE 2 0 SH DA (SUTURE) IMPLANT
SUT SILK 2 0 (SUTURE)
SUT SILK 2 0 SH CR/8 (SUTURE) IMPLANT
SUT SILK 2-0 18XBRD TIE 12 (SUTURE) IMPLANT
SUT SILK 3 0 (SUTURE) ×2
SUT SILK 3 0 SH CR/8 (SUTURE) ×2 IMPLANT
SUT SILK 3-0 18XBRD TIE 12 (SUTURE) ×1 IMPLANT
SUT V-LOC BARB 180 2/0GR6 GS22 (SUTURE)
SUT VIC AB 3-0 SH 18 (SUTURE) IMPLANT
SUT VIC AB 3-0 SH 27 (SUTURE)
SUT VIC AB 3-0 SH 27XBRD (SUTURE) IMPLANT
SUT VICRYL 0 UR6 27IN ABS (SUTURE) ×2 IMPLANT
SUTURE V-LC BRB 180 2/0GR6GS22 (SUTURE) IMPLANT
SYR 10ML ECCENTRIC (SYRINGE) ×2 IMPLANT
SYS LAPSCP GELPORT 120MM (MISCELLANEOUS)
SYSTEM LAPSCP GELPORT 120MM (MISCELLANEOUS) IMPLANT
TAPE UMBILICAL COTTON 1/8X30 (MISCELLANEOUS) ×1 IMPLANT
TOWEL OR NON WOVEN STRL DISP B (DISPOSABLE) ×2 IMPLANT
TRAY FOLEY MTR SLVR 16FR STAT (SET/KITS/TRAYS/PACK) ×2 IMPLANT
TROCAR ADV FIXATION 5X100MM (TROCAR) ×2 IMPLANT
TUBING CONNECTING 10 (TUBING) ×4 IMPLANT
TUBING INSUFFLATION 10FT LAP (TUBING) ×2 IMPLANT

## 2019-01-16 NOTE — Anesthesia Procedure Notes (Signed)
Procedure Name: Intubation Performed by: Gean Maidens, CRNA Pre-anesthesia Checklist: Patient identified, Emergency Drugs available, Suction available, Patient being monitored and Timeout performed Patient Re-evaluated:Patient Re-evaluated prior to induction Oxygen Delivery Method: Circle system utilized Preoxygenation: Pre-oxygenation with 100% oxygen Induction Type: IV induction Ventilation: Mask ventilation without difficulty Laryngoscope Size: Mac and 4 Grade View: Grade I Tube type: Oral Tube size: 7.5 mm Number of attempts: 1 Airway Equipment and Method: Stylet Placement Confirmation: ETT inserted through vocal cords under direct vision,  positive ETCO2 and breath sounds checked- equal and bilateral Secured at: 23 cm Tube secured with: Tape Dental Injury: Teeth and Oropharynx as per pre-operative assessment

## 2019-01-16 NOTE — Interval H&P Note (Signed)
History and Physical Interval Note:  01/16/2019 11:53 AM  Ray Brown  has presented today for surgery, with the diagnosis of DIVERTICULITIS. Patient underwent colonoscopy yesterday showing no malignancy or other abnormalities, consistent with diverticulitis. Most recent CAT scan shows no more abscess cavity or free air. Just chronic diverticulitis. Stabilized on IV antibiotics. Ready for surgery. The various methods of treatment have been discussed with the patient and family. After consideration of risks, benefits and other options for treatment, the patient has consented to  Procedure(s): XI ROBOTIC RESECTION OF SIGMOID COLON (N/A) LYSIS OF ADHESIONS (N/A) RIGID PROCTOSCOPY (N/A) as a surgical intervention.  The patient's history has been reviewed, patient examined, no change in status, stable for surgery.  I have reviewed the patient's chart and labs.  Questions were answered to the patient's satisfaction.    I have re-reviewed the the patient's records, history, medications, and allergies.  I have re-examined the patient.  I again discussed intraoperative plans and goals of post-operative recovery.  The patient agrees to proceed.  Ray Brown  [birth year] [id number]  Patient Care Team: [name], MD as PCP - General (Family Medicine) [name], MD as PCP - Cardiology (Cardiology) Early, [name], MD as Consulting Physician (Vascular Surgery) [name], MD as Consulting Physician (Orthopedic Surgery) [name], MD as Consulting Physician (Cardiology) [name], MD as Consulting Physician (Colon and Rectal Surgery) [name], MD as Consulting Physician (Gastroenterology)  Patient Active Problem List   Diagnosis Date Noted  . Diverticulitis of colon with perforation 11/03/2018    Priority: High  . Coronary stent 2008  restenosis due to progression of disease s/p restent 2010 12/28/2018    Priority: Medium  . Coronary artery disease involving native  coronary artery of native heart without angina pectoris 10/04/2013    Priority: Medium  . Hypomagnesemia 12/29/2018  . Protein-calorie malnutrition, moderate ([place]) 12/29/2018  . Chronic anticoagulation 12/28/2018  . Status post aortobifemoral bypass surgery 2013 12/28/2018  . Left-sided low back pain with left-sided sciatica 12/28/2018  . Nausea & vomiting 12/28/2018  . Hypokalemia 12/28/2018  . Chronic back pain   . BPPV (benign paroxysmal positional vertigo) 11/10/2018  . Dyslipidemia 11/03/2018  . OSA (obstructive sleep apnea) 11/03/2018  . Pain of right shoulder joint on movement 06/20/2018  . Dyspnea 12/05/2014  . Dizziness 12/05/2014  . Diabetes mellitus with peripheral artery disease ([place]) 10/04/2013  . Impingement syndrome of right shoulder 03/28/2013  . Obesity 01/21/2013  . Peripheral vascular disease ([place])     Past Medical History:  Diagnosis Date  . Arthritis   . Bronchitis   . Chronic back pain   . Complication of anesthesia    difficulty breathing s/p back surgery at [facility] 2013  . Coronary artery disease   . Diabetes mellitus without complication ([place])   . Dry skin    in the back of head-left side  . GERD (gastroesophageal reflux disease)   . H/O hiatal hernia   . Hypercholesteremia   . Peripheral vascular disease ([place])   . Pneumonia   . Shortness of breath   . Sleep apnea    has CPAP but does not use  . Unstable angina ([place]) 12/05/2014    Past Surgical History:  Procedure Laterality Date  . AORTA - BILATERAL FEMORAL ARTERY BYPASS GRAFT  2003   Dr Curt Jews  . BACK SURGERY  2004  . CARDIAC CATHETERIZATION  2010   x 2 stents RCA  . CARDIAC CATHETERIZATION N/A 12/08/2014  Procedure: Left Heart Cath and Coronary Angiography;  Surgeon: Lorretta Harp, MD;  Location: [facility];  Service: Cardiovascular;  Laterality: N/A;  . COLONOSCOPY  08/04/2009  . CORONARY ANGIOPLASTY     2 stents  . FOOT SURGERY  1980s   right foot  . SHOULDER  ARTHROSCOPY WITH SUBACROMIAL DECOMPRESSION Right 03/28/2013   Procedure: RIGHT SHOULDER ARTHROSCOPY WITH SUBACROMIAL DECOMPRESSION AND DISTAL CLAVICLE RESECTION;  Surgeon: Marin Shutter, MD;  Location: [facility];  Service: Orthopedics;  Laterality: Right;  . SHOULDER SURGERY     left shoulder rotator cuff repair  . UPPER GI ENDOSCOPY      Social History   Socioeconomic History  . Marital status: Married    Spouse name: Not on file  . Number of children: Not on file  . Years of education: Not on file  . Highest education level: Not on file  Occupational History  . Occupation: retired  Scientific laboratory technician  . Financial resource strain: Not on file  . Food insecurity    Worry: Not on file    Inability: Not on file  . Transportation needs    Medical: Not on file    Non-medical: Not on file  Tobacco Use  . Smoking status: Former Smoker    Years: 35.00    Types: Cigarettes  . Smokeless tobacco: Never Used  Substance and Sexual Activity  . Alcohol use: No  . Drug use: No  . Sexual activity: Not on file  Lifestyle  . Physical activity    Days per week: Not on file    Minutes per session: Not on file  . Stress: Not on file  Relationships  . Social Herbalist on phone: Not on file    Gets together: Not on file    Attends religious service: Not on file    Active member of club or organization: Not on file    Attends meetings of clubs or organizations: Not on file    Relationship status: Not on file  . Intimate partner violence    Fear of current or ex partner: Not on file    Emotionally abused: Not on file    Physically abused: Not on file    Forced sexual activity: Not on file  Other Topics Concern  . Not on file  Social History Narrative  . Not on file    Family History  Problem Relation Age of Onset  . Colon cancer Mother   . Heart attack Father        , massive  . Coronary artery disease Brother   . Thyroid cancer Brother     Medications Prior to Admission   Medication Sig Dispense Refill Last Dose  . atorvastatin (LIPITOR) 80 MG tablet TAKE 1 TABLET BY MOUTH AT  BEDTIME (Patient taking differently: Take 80 mg by mouth daily at 6 PM. ) 90 tablet 3 01/14/2019 at 2000  . fenofibrate 160 MG tablet Take 160 mg by mouth at bedtime.   01/14/2019 at 2000  . magnesium oxide (MAG-OX) 400 (241.3 Mg) MG tablet Take 1 tablet (400 mg total) by mouth daily. 30 tablet 2 01/14/2019 at 1200  . metoprolol succinate (TOPROL-XL) 25 MG 24 hr tablet TAKE 1 TABLET BY MOUTH  DAILY (Patient taking differently: Take 25 mg by mouth daily. ) 90 tablet 3 01/14/2019 at 2000  . niacin (NIASPAN) 750 MG CR tablet Take 1,500 mg by mouth at bedtime.   01/14/2019 at 2000  .  pantoprazole (PROTONIX) 40 MG tablet Take 40 mg by mouth at bedtime.   01/14/2019 at 2000  . polyethylene glycol (MIRALAX / GLYCOLAX) 17 g packet Take 17 g by mouth 2 (two) times daily. (Patient taking differently: Take 17 g by mouth daily. ) 14 each 0 01/13/2019  . aspirin EC 81 MG tablet Take 81 mg by mouth at bedtime.   Unknown at Unknown time  . clopidogrel (PLAVIX) 75 MG tablet TAKE 1 TABLET BY MOUTH AT  BEDTIME (Patient taking differently: Take 75 mg by mouth daily. ) 90 tablet 3 Unknown at Unknown time  . hydrocortisone (ANUSOL-HC) 2.5 % rectal cream Apply 1 application topically 4 (four) times daily as needed for hemorrhoids. (Patient not taking: Reported on 01/07/2019) 30 g 0 Not Taking at Unknown time  . nitroGLYCERIN (NITROSTAT) 0.4 MG SL tablet Place 0.4 mg under the tongue every 5 (five) minutes as needed for chest pain.   More than a month at Unknown time  . ondansetron (ZOFRAN-ODT) 4 MG disintegrating tablet Take 4 mg by mouth every 8 (eight) hours as needed for nausea/vomiting.   More than a month at Unknown time    Current Facility-Administered Medications  Medication Dose Route Frequency Provider Last Rate Last Dose  . bupivacaine liposome (EXPAREL) 1.3 % injection 266 mg  20 mL Infiltration On Call  to OR [name], MD      . cefoTEtan (CEFOTAN) 2 g in sodium chloride 0.9 % 100 mL IVPB  2 g Intravenous On Call to OR [name], MD      . clindamycin (CLEOCIN) 900 mg, gentamicin (GARAMYCIN) 240 mg in sodium chloride 0.9 % 1,000 mL for intraperitoneal lavage   Irrigation On Call to Candis Shine, MD      . lactated ringers infusion   Intravenous Continuous Myrtie Soman, MD 75 mL/hr at 01/16/19 1111    . metoprolol succinate (TOPROL-XL) 24 hr tablet 25 mg  25 mg Oral Daily Myrtie Soman, MD   25 mg at 01/16/19 1111     Allergies  Allergen Reactions  . Ciprofloxacin Rash  . Daypro [Oxaprozin] Rash    Ht 5\' 8"  (1.727 m)   Wt 83.2 kg Comment: as per PST visit  BMI 27.89 kg/m   Labs: Results for orders placed or performed during the hospital encounter of 01/16/19 (from the past 48 hour(s))  Glucose, capillary     Status: Abnormal   Collection Time: 01/16/19 10:34 AM  Result Value Ref Range   Glucose-Capillary 117 (H) 70 - 99 mg/dL   Comment 1 Notify RN    Comment 2 Document in Chart     Imaging / Studies: Dg Chest 2 View  Result Date: 01/14/2019 CLINICAL DATA:  Preoperative assessment for bowel resection EXAM: CHEST - 2 VIEW COMPARISON:  June 03, 2015 FINDINGS: Lungs are clear. Heart size and pulmonary vascularity are normal. No adenopathy. There is mild degenerative change in the thoracic spine. IMPRESSION: No edema or consolidation. Electronically Signed   By: Lowella Grip III M.D.   On: 01/14/2019 09:29   Ct Abdomen Pelvis W Contrast  Result Date: 12/28/2018 CLINICAL DATA:  Diverticulitis EXAM: CT ABDOMEN AND PELVIS WITH CONTRAST TECHNIQUE: Multidetector CT imaging of the abdomen and pelvis was performed using the standard protocol following bolus administration of intravenous contrast. CONTRAST:  128mL OMNIPAQUE IOHEXOL 300 MG/ML  SOLN COMPARISON:  November 03, 2018 FINDINGS: Lower chest: The visualized heart size within normal limits. No pericardial  fluid/thickening. No hiatal hernia. The  visualized portions of the lungs are clear. Hepatobiliary: The liver is normal in density without focal abnormality.The main portal vein is patent. No evidence of calcified gallstones, gallbladder wall thickening or biliary dilatation. Pancreas: Unremarkable. No pancreatic ductal dilatation or surrounding inflammatory changes. Spleen: Normal in size without focal abnormality. Adrenals/Urinary Tract: Both adrenal glands appear normal. Tiny low-density lesions are seen within both kidneys as on the prior exam. Stomach/Bowel: The stomach and small bowel are normal in appearance. Again noted is a focal segment of distal sigmoid colon diverticulitis with diffuse bowel wall thickening. Tiny foci of free air are again noted superior to area at with non loculated fluid. There likely increased appearance of the non loculated collection since the prior exam the appendix is unremarkable. Vascular/Lymphatic: There are no enlarged mesenteric, retroperitoneal, or pelvic lymph nodes. Scattered aortic atherosclerotic calcifications are seen without aneurysmal dilatation. Again noted is a aorto bi femoral stent graft. Reproductive: The prostate is heterogeneous in appearance. Other: No evidence of abdominal wall mass or hernia. Musculoskeletal: No acute or significant osseous findings. Prior posterior lumbar spine fixation is seen. IMPRESSION: Again noted is distal sigmoid diverticulitis with adjacent micro perforation non loculated fluid which could represent phlegmon. The non-loculated collection appears slightly more prominent than the prior exam. Electronically Signed   By: Prudencio Pair M.D.   On: 12/28/2018 19:51   [place] Ekg Site Rite  Result Date: 12/31/2018 If Site Rite image not attached, placement could not be confirmed due to current cardiac rhythm.    [name], M.D., F.A.C.S. Gastrointestinal and Minimally Invasive Surgery [facility] [address] [place], [place] [address] [phone] Main / Paging  01/16/2019 11:54 AM    [name]

## 2019-01-16 NOTE — Anesthesia Postprocedure Evaluation (Signed)
Anesthesia Post Note  Patient: Ray Brown  Procedure(s) Performed: XI ROBOTIC RESECTION OF SIGMOID COLON (N/A ) LYSIS OF ADHESIONS (N/A ) RIGID PROCTOSCOPY (N/A )     Patient location during evaluation: PACU Anesthesia Type: General Level of consciousness: awake and alert, oriented and patient cooperative Pain management: pain level controlled Vital Signs Assessment: post-procedure vital signs reviewed and stable Respiratory status: spontaneous breathing, nonlabored ventilation and respiratory function stable Cardiovascular status: blood pressure returned to baseline and stable Postop Assessment: no apparent nausea or vomiting Anesthetic complications: no    Last Vitals:  Vitals:   01/16/19 1611 01/16/19 1615  BP: 137/80 93/75  Pulse: 85 85  Resp: 18 (!) 27  Temp: 36.5 C   SpO2: 100% 100%    Last Pain:  Vitals:   01/16/19 1611  TempSrc:   PainSc: 0-No pain                 Pervis Hocking

## 2019-01-16 NOTE — Op Note (Signed)
01/16/2019  3:59 PM  PATIENT:  Ray Brown  68 y.o. male  Patient Care Team: [name], MD as PCP - General (Family Medicine) [name], MD as PCP - Cardiology (Cardiology) Early, [name], MD as Consulting Physician (Vascular Surgery) [name], MD as Consulting Physician (Orthopedic Surgery) [name], MD as Consulting Physician (Cardiology) [name], MD as Consulting Physician (Colon and Rectal Surgery) [name], MD as Consulting Physician (Gastroenterology)  PRE-OPERATIVE DIAGNOSIS:  DIVERTICULITIS SIGMOID COLON WITH PRIOR PERFORATION/ABSCESS  POST-OPERATIVE DIAGNOSIS: Sigmoid colon diverticulitis with contained abscess  PROCEDURE:  ROBOTIC LOW ANTERIOR RECTOSIGMOID RESECTION DRAINAGE OF INTRAABDOMINAL ABSCESS ASSESSMENT OF TISSUE PERFUSION USING FIREFLY LYSIS OF ADHESIONS TAP BLOCK - BILATERAL RIGID PROCTOSCOPY  SURGEON:  [name], MD  ASSISTANT: Nadeen Landau, MD An experienced assistant was required given the standard of surgical care given the complexity of the case.  This assistant was needed for exposure, dissection, suctioning, retraction, instrument exchange, etc.  ANESTHESIA:     General  Nerve block provided with liposomal bupivacaine (Experel) mixed with 0.25% bupivacaine as a Bilateral TAP block x 67mL each side at the level of the transverse abdominis & preperitoneal spaces along the flank at the anterior axillary line, from subcostal ridge to iliac crest under laparoscopic guidance   Local field block at port sites & extraction wound  EBL:  Total I/O In: 2000 [I.V.:2000] Out: 200 [Urine:100; Blood:100]  Delay start of Pharmacological VTE agent (>24hrs) due to surgical blood loss or risk of bleeding:  no  DRAINS: 19 Fr Blake drain goes to the pelvis  SPECIMEN:   RECTOSIGMOID COLON (open end proximal) DISTAL ANASTOMOTIC RING (final distal margin)  DISPOSITION OF SPECIMEN:  PATHOLOGY  COUNTS:  YES  PLAN OF  CARE: Admit to inpatient   PATIENT DISPOSITION:  PACU - hemodynamically stable.  INDICATION:    Pleasant gentleman with episodes of diverticulitis.  Perforation abscess.  Readmissions due to persistent disease ultimately requiring several weeks of IV ertapenem to stay under control.  Underwent colonoscopy yesterday showing diverticulitis only and no evidence of any tumor or other etiology.  History of aortoiliac occlusive disease status post laparotomy with aortobifem done inferior to the IMA.  No evidence of any further progression.  No longer smoking.  Blood pressure and cholesterol well controlled.  I recommended segmental resection:  The anatomy & physiology of the digestive tract was discussed.  The pathophysiology was discussed.  Natural history risks without surgery was discussed.   I worked to give an overview of the disease and the frequent need to have multispecialty involvement.  I feel the risks of no intervention will lead to serious problems that outweigh the operative risks; therefore, I recommended a partial colectomy to remove the pathology.  Laparoscopic & open techniques were discussed.   Risks such as bleeding, infection, abscess, leak, reoperation, possible ostomy, hernia, heart attack, death, and other risks were discussed.  I noted a good likelihood this will help address the problem.   Goals of post-operative recovery were discussed as well.  We will work to minimize complications.  Educational materials on the pathology had been given in the office.  Questions were answered.    The patient expressed understanding & wished to proceed with surgery.  OR FINDINGS:   Patient had chronically inflamed sigmoid colon.  Abscess between the base of the rectosigmoid mesentery and the terminal ileal serosa and mesentery rather small and able to be removed.  No obvious metastatic disease on visceral parietal  peritoneum or liver.  The anastomosis rests 11 cm from the anal verge by rigid  proctoscopy.  It is a 29 EEA Ethicon stapled anastomosis  DESCRIPTION:   Informed consent was confirmed.  The patient underwent general anaesthesia without difficulty.  The patient was positioned appropriately.  VTE prevention in place.  The patient was clipped, prepped, & draped in a sterile fashion.  Surgical timeout confirmed our plan.  The patient was positioned in reverse Trendelenburg.  Abdominal entry was gained using Varess technique at the left subcostal ridge on the anterior abdominal wall.  No elevated EtCO2 noted.  Port placed.  Moderate omental adhesions noted through most of the upper and central abdomen consistent with his prior laparotomy.  Camera inspection revealed no injury.  Extra ports were carefully placed under direct laparoscopic visualization.  Used laparoscopic sharp and blunt dissection to free the greater omentum off the anterior abdominal wall.  The patient was carefully positioned.  The [employer]uitive daVinci robot was docked with camera & instruments carefully placed.  The patient had thickened sigmoid colon with a shortened mesentery.  There is a loop of terminal ileum densely adherent to the rectosigmoid base.  I used sharp scissors to help free the loop of terminal ileum off of the rectosigmoid mesentery.  I encountered a small abscess pocket which was removed and aspirated.  We did meticulous inspection and noted no enterotomy or other abnormality on the terminal ileum.  I had intentionally gone into the rectosigmoid mesentery to leave a patch on the terminal ileum.  Without I could mobilize that out of the way.  I elevated rectosigmoid mesentery.his anatomy had some extra retroperitoneal adhesions consistent with his prior aortobifem.  I freed loops of jejunum off the retroperitoneum to expose the true ligament of Treitz.  I scored the base of peritoneum of the medial side of the mesentery of the elevated left colon from the ligament of Treitz to the peritoneal reflection of  the mid rectum.   I alternated from medial to lateral lateral medial dissection to help with free schnapps to mobilize and elevate.  The retromesenteric plane was somewhat fibrotic and atypical consistent with prior dissection.  However eventually I was able to elevate the sigmoid mesentery and entered into the retro-mesenteric plane.  I was able to get into the presacral space over the sacral promontory and get too much mesorectum at the mid rectum.  Freddrick March out on elevated.  And then transected some of the side pedicles of the proximal rectum since the abscess was densely adherent to that and that cannot be saved.  Was able to free off the peritoneal attachments off the rectosigmoid colon close to the rectosigmoid.  The ureter cannot initially be seen well but eventually were able to find the ureter as it was in the upper left lateral pelvis.  Could fall that back into some more dense retroperitoneal folds overlying the aortobifem.  We are able to follow that more proximally and confirmed that the ureter was retroperitoneal and not involved.  Inferior mesenteric vein and inferior mesenteric artery to help confirm and follow the ureter more proximally as well.  With that we elevated the cecum Annye English anteriorly.  I skeletonized the inferior mesenteric artery pedicle and transected it closer to the sigmoid colon to avoid any retroperitoneal injury.  Used a vessel sealer primarily for this.    I mobilized the peritoneal coverings towards the peritoneal reflection on both the right and left sides of the rectum to help straighten  out the area since there was some thickening of the visceral peritoneum in the pelvis consistent with a chronic diverticulitis..  I stayed away from the right and left ureters.  I kept the lateral vascular pedicles to the rectum intact.  I skeletonized the mesorectum at the junction at the mid rectum for the distal point of resection.  I mobilized the left colon in a lateral to medial  fashion off the line of Toldt up towards the splenic flexure to ensure good mobilization of the remaining left colon to reach into the pelvis.  I transected at the mid rectum using a robotic 60 mm green load stapler x 1 firing.  I chose a region at the descending/sigmoid junction that was soft and easily reached down to the rectal stump.  I transected the mesentery of the colon radially to preserve remaining colon blood supply.  Because of history of aortoiliac and atherosclerotic disease, and decided to assess tissue perfusion using firefly.  We asked anesthesia to dilute the indocyanine green (ICG) to 10 mL and inject 3 mL intravenously with IV flush.  I switched to the NIR fluorescence (Firefly mode) imaging window on the daVinci platform.  I was able to see good light green visualization of blood vessels with good perfusion of tissues, confirming good tissue perfusion of both ends (descending colon and mid rectum) planned for anastomosis.  I created an extraction incision through a small Pfannenstiel incision in the suprapubic region.  Placed a wound protector.  I was able to eviscerate the rectosigmoid and descending colon out the wound.   I clamped the colon proximal to this area using a reusable pursestringer device.  Passed a 2-0 Keith needle. I transected at the descending/sigmoid junction with a scalpel. I got healthy bleeding mucosa.  We sent the rectosigmoid colon specimen off to go to pathology.  We sized the colon orifice.  I chose a 29 EEA anvil stapler system.  I reinforced the prolene pursestring with interrupted silk suture.  I placed the anvil to the open end of the proximal remaining colon and closed around it using the pursestring.    We did copious irrigation with crystalloid solution.  Hemostasis was good.  The distal end of the remaining colon easily reached down to the rectal stump, therefore, splenic flexure mobilization was not needed.      Dr [name] scrubbed down and did gentle anal  dilation and advanced the EEA stapler up the rectal stump. The spike was brought out at the provimal end of the rectal stump under direct visualization.  I attached the anvil of the proximal colon the spike of the stapler. Anvil was tightened down and held clamped for 60 seconds. The EEA stapler was fired and held clamped for 30 seconds. The stapler was released & removed. We noted 2 excellent anastomotic rings. Blue stitch is in the proximal ring.  Dr [name] did rigid proctoscopy noted the anastomosis was at 11-12 cm from the anal verge consistent with the proximal rectum.  We did a final irrigation of antibiotic solution (900 mg clindamycin/240 mg gentamicin in a liter of crystalloid) & held that for the pelvic air leak test .  The rectum was insufflated the rectum while clamping the colon proximal to that anastomosis.  There was a negative air leak test. There was no tension of mesentery or bowel at the anastomosis.   Tissues looked viable.  Ureters & bowel uninjured.  The anastomosis looked healthy.  Because of his dense omental adhesions I  cannot get that easily reached down the pelvis.  We therefore left it in situ in the upper abdomen  Endoluminal gas was evacuated.  Ports & wound protector removed.  We changed gloves & redraped the patient per colon SSI prevention protocol.  We aspirated the antibiotic irrigation.  Hemostasis was good.  Sterile unused instruments were used from this point.  I closed the skin at the port sites using Monocryl stitch and sterile dressing.  I closed the extraction wound using a 0 Vicryl vertical peritoneal closure and a #1 PDS transverse anterior rectal fascial closure like a small Pfannenstiel closure. I closed the skin with some interrupted Monocryl stitches. I placed antibiotic-soaked wicks into the closure at the corners x2.  I placed sterile dressings.     Patient is being extubated go to recovery room. I had discussed postop care with the patient in detail the office &  in the holding area. Instructions are written. I discussed operative findings, updated the patient's status, discussed probable steps to recovery, and gave postoperative recommendations to the patient's spouse, Chante Midence.  Recommendations were made.  Questions were answered.  She expressed understanding & appreciation.   [name], M.D., F.A.C.S. Gastrointestinal and Minimally Invasive Surgery [facility] [address], [place], [place] [address] [phone] Main / Paging

## 2019-01-16 NOTE — Transfer of Care (Signed)
Immediate Anesthesia Transfer of Care Note  Patient: Ray Brown  Procedure(s) Performed: XI ROBOTIC RESECTION OF SIGMOID COLON (N/A ) LYSIS OF ADHESIONS (N/A ) RIGID PROCTOSCOPY (N/A )  Patient Location: PACU  Anesthesia Type:General  Level of Consciousness: sedated, patient cooperative and responds to stimulation  Airway & Oxygen Therapy: Patient Spontanous Breathing and Patient connected to face mask oxygen  Post-op Assessment: Report given to RN and Post -op Vital signs reviewed and stable  Post vital signs: Reviewed and stable  Last Vitals:  Vitals Value Taken Time  BP 137/80 01/16/19 1611  Temp    Pulse 85 01/16/19 1611  Resp 20 01/16/19 1612  SpO2 100 % 01/16/19 1611  Vitals shown include unvalidated device data.  Last Pain:  Vitals:   01/16/19 1107  TempSrc:   PainSc: 0-No pain         Complications: No apparent anesthesia complications

## 2019-01-16 NOTE — Discharge Instructions (Signed)
SURGERY: POST OP INSTRUCTIONS °(Surgery for small bowel obstruction, colon resection, etc) ° ° °###################################################################### ° °EAT °Gradually transition to a high fiber diet with a fiber supplement over the next few days after discharge ° °WALK °Walk an hour a day.  Control your pain to do that.   ° °CONTROL PAIN °Control pain so that you can walk, sleep, tolerate sneezing/coughing, go up/down stairs. ° °HAVE A BOWEL MOVEMENT DAILY °Keep your bowels regular to avoid problems.  OK to try a laxative to override constipation.  OK to use an antidairrheal to slow down diarrhea.  Call if not better after 2 tries ° °CALL IF YOU HAVE PROBLEMS/CONCERNS °Call if you are still struggling despite following these instructions. °Call if you have concerns not answered by these instructions ° °###################################################################### ° ° °DIET °Follow a light diet the first few days at home.  Start with a bland diet such as soups, liquids, starchy foods, low fat foods, etc.  If you feel full, bloated, or constipated, stay on a ful liquid or pureed/blenderized diet for a few days until you feel better and no longer constipated. °Be sure to drink plenty of fluids every day to avoid getting dehydrated (feeling dizzy, not urinating, etc.). °Gradually add a fiber supplement to your diet over the next week.  Gradually get back to a regular solid diet.  Avoid fast food or heavy meals the first week as you are more likely to get nauseated. °It is expected for your digestive tract to need a few months to get back to normal.  It is common for your bowel movements and stools to be irregular.  You will have occasional bloating and cramping that should eventually fade away.  Until you are eating solid food normally, off all pain medications, and back to regular activities; your bowels will not be normal. °Focus on eating a low-fat, high fiber diet the rest of your life  (See Getting to Good Bowel Health, below). ° °CARE of your INCISION or WOUND °It is good for closed incision and even open wounds to be washed every day.  Shower every day.  Short baths are fine.  Wash the incisions and wounds clean with soap & water.    °If you have a closed incision(s), wash the incision with soap & water every day.  You may leave closed incisions open to air if it is dry.   You may cover the incision with clean gauze & replace it after your daily shower for comfort. °If you have skin tapes (Steristrips) or skin glue (Dermabond) on your incision, leave them in place.  They will fall off on their own like a scab.  You may trim any edges that curl up with clean scissors.  If you have staples, set up an appointment for them to be removed in the office in 10 days after surgery.  °If you have a drain, wash around the skin exit site with soap & water and place a new dressing of gauze or band aid around the skin every day.  Keep the drain site clean & dry.    °If you have an open wound with packing, see wound care instructions.  In general, it is encouraged that you remove your dressing and packing, shower with soap & water, and replace your dressing once a day.  Pack the wound with clean gauze moistened with normal (0.9%) saline to keep the wound moist & uninfected.  Pressure on the dressing for 30 minutes will stop most wound   bleeding.  Eventually your body will heal & pull the open wound closed over the next few months.  °Raw open wounds will occasionally bleed or secrete yellow drainage until it heals closed.  Drain sites will drain a little until the drain is removed.  Even closed incisions can have mild bleeding or drainage the first few days until the skin edges scab over & seal.   °If you have an open wound with a wound vac, see wound vac care instructions. ° ° ° ° °ACTIVITIES as tolerated °Start light daily activities --- self-care, walking, climbing stairs-- beginning the day after surgery.   Gradually increase activities as tolerated.  Control your pain to be active.  Stop when you are tired.  Ideally, walk several times a day, eventually an hour a day.   °Most people are back to most day-to-day activities in a few weeks.  It takes 4-8 weeks to get back to unrestricted, intense activity. °If you can walk 30 minutes without difficulty, it is safe to try more intense activity such as jogging, treadmill, bicycling, low-impact aerobics, swimming, etc. °Save the most intensive and strenuous activity for last (Usually 4-8 weeks after surgery) such as sit-ups, heavy lifting, contact sports, etc.  Refrain from any intense heavy lifting or straining until you are off narcotics for pain control.  You will have off days, but things should improve week-by-week. °DO NOT PUSH THROUGH PAIN.  Let pain be your guide: If it hurts to do something, don't do it.  Pain is your body warning you to avoid that activity for another week until the pain goes down. °You may drive when you are no longer taking narcotic prescription pain medication, you can comfortably wear a seatbelt, and you can safely make sudden turns/stops to protect yourself without hesitating due to pain. °You may have sexual intercourse when it is comfortable. If it hurts to do something, stop. ° °MEDICATIONS °Take your usually prescribed home medications unless otherwise directed.   °Blood thinners:  °Usually you can restart any strong blood thinners after the second postoperative day.  It is OK to take aspirin right away.    ° If you are on strong blood thinners (warfarin/Coumadin, Plavix, Xerelto, Eliquis, Pradaxa, etc), discuss with your surgeon, medicine PCP, and/or cardiologist for instructions on when to restart the blood thinner & if blood monitoring is needed (PT/INR blood check, etc).   ° ° °PAIN CONTROL °Pain after surgery or related to activity is often due to strain/injury to muscle, tendon, nerves and/or incisions.  This pain is usually  short-term and will improve in a few months.  °To help speed the process of healing and to get back to regular activity more quickly, DO THE FOLLOWING THINGS TOGETHER: °1. Increase activity gradually.  DO NOT PUSH THROUGH PAIN °2. Use Ice and/or Heat °3. Try Gentle Massage and/or Stretching °4. Take over the counter pain medication °5. Take Narcotic prescription pain medication for more severe pain ° °Good pain control = faster recovery.  It is better to take more medicine to be more active than to stay in bed all day to avoid medications. °1.  Increase activity gradually °Avoid heavy lifting at first, then increase to lifting as tolerated over the next 6 weeks. °Do not “push through” the pain.  Listen to your body and avoid positions and maneuvers than reproduce the pain.  Wait a few days before trying something more intense °Walking an hour a day is encouraged to help your body recover faster   and more safely.  Start slowly and stop when getting sore.  If you can walk 30 minutes without stopping or pain, you can try more intense activity (running, jogging, aerobics, cycling, swimming, treadmill, sex, sports, weightlifting, etc.) °Remember: If it hurts to do it, then don’t do it! °2. Use Ice and/or Heat °You will have swelling and bruising around the incisions.  This will take several weeks to resolve. °Ice packs or heating pads (6-8 times a day, 30-60 minutes at a time) will help sooth soreness & bruising. °Some people prefer to use ice alone, heat alone, or alternate between ice & heat.  Experiment and see what works best for you.  Consider trying ice for the first few days to help decrease swelling and bruising; then, switch to heat to help relax sore spots and speed recovery. °Shower every day.  Short baths are fine.  It feels good!  Keep the incisions and wounds clean with soap & water.   °3. Try Gentle Massage and/or Stretching °Massage at the area of pain many times a day °Stop if you feel pain - do not  overdo it °4. Take over the counter pain medication °This helps the muscle and nerve tissues become less irritable and calm down faster °Choose ONE of the following over-the-counter anti-inflammatory medications: °Acetaminophen 500mg tabs (Tylenol) 1-2 pills with every meal and just before bedtime (avoid if you have liver problems or if you have acetaminophen in you narcotic prescription) °Naproxen 220mg tabs (ex. Aleve, Naprosyn) 1-2 pills twice a day (avoid if you have kidney, stomach, IBD, or bleeding problems) °Ibuprofen 200mg tabs (ex. Advil, Motrin) 3-4 pills with every meal and just before bedtime (avoid if you have kidney, stomach, IBD, or bleeding problems) °Take with food/snack several times a day as directed for at least 2 weeks to help keep pain / soreness down & more manageable. °5. Take Narcotic prescription pain medication for more severe pain °A prescription for strong pain control is often given to you upon discharge (for example: oxycodone/Percocet, hydrocodone/Norco/Vicodin, or tramadol/Ultram) °Take your pain medication as prescribed. °Be mindful that most narcotic prescriptions contain Tylenol (acetaminophen) as well - avoid taking too much Tylenol. °If you are having problems/concerns with the prescription medicine (does not control pain, nausea, vomiting, rash, itching, etc.), please call us [phone] to see if we need to switch you to a different pain medicine that will work better for you and/or control your side effects better. °If you need a refill on your pain medication, you must call the office before 4 pm and on weekdays only.  By federal law, prescriptions for narcotics cannot be called into a pharmacy.  They must be filled out on paper & picked up from our office by the patient or authorized caretaker.  Prescriptions cannot be filled after 4 pm nor on weekends.   ° °WHEN TO CALL US [phone] °Severe uncontrolled or worsening pain  °Fever over 101 F (38.5 C) °Concerns with  the incision: Worsening pain, redness, rash/hives, swelling, bleeding, or drainage °Reactions / problems with new medications (itching, rash, hives, nausea, etc.) °Nausea and/or vomiting °Difficulty urinating °Difficulty breathing °Worsening fatigue, dizziness, lightheadedness, blurred vision °Other concerns °If you are not getting better after two weeks or are noticing you are getting worse, contact our office [phone] for further advice.  We may need to adjust your medications, re-evaluate you in the office, send you to the emergency room, or see what other things we can do to help. °The   clinic staff is available to answer your questions during regular business hours (8:30am-5pm).  Please don’t hesitate to call and ask to speak to one of our nurses for clinical concerns.    °A surgeon from [facility] is always on call at the hospitals 24 hours/day °If you have a medical emergency, go to the nearest emergency room or call 911. ° °FOLLOW UP in our office °One the day of your discharge from the hospital (or the next business weekday), please call [facility] to set up or confirm an appointment to see your surgeon in the office for a follow-up appointment.  Usually it is 2-3 weeks after your surgery.   °If you have skin staples at your incision(s), let the office know so we can set up a time in the office for the nurse to remove them (usually around 10 days after surgery). °Make sure that you call for appointments the day of discharge (or the next business weekday) from the hospital to ensure a convenient appointment time. °IF YOU HAVE DISABILITY OR FAMILY LEAVE FORMS, BRING THEM TO THE OFFICE FOR PROCESSING.  DO NOT GIVE THEM TO YOUR DOCTOR. ° °[facility] °[address], [address], [place], [place]  [address] ? °[phone] - Main °[phone] - Toll Free,  [phone] - Fax °[email or web address] ° °GETTING TO GOOD BOWEL HEALTH. °It is  expected for your digestive tract to need a few months to get back to normal.  It is common for your bowel movements and stools to be irregular.  You will have occasional bloating and cramping that should eventually fade away.  Until you are eating solid food normally, off all pain medications, and back to regular activities; your bowels will not be normal.   °Avoiding constipation °The goal: ONE SOFT BOWEL MOVEMENT A DAY!    °Drink plenty of fluids.  Choose water first. °TAKE A FIBER SUPPLEMENT EVERY DAY THE REST OF YOUR LIFE °During your first week back home, gradually add back a fiber supplement every day °Experiment which form you can tolerate.   There are many forms such as powders, tablets, wafers, gummies, etc °Psyllium bran (Metamucil), methylcellulose (Citrucel), Miralax or Glycolax, Benefiber, Flax Seed.  °Adjust the dose week-by-week (1/2 dose/day to 6 doses a day) until you are moving your bowels 1-2 times a day.  Cut back the dose or try a different fiber product if it is giving you problems such as diarrhea or bloating. °Sometimes a laxative is needed to help jump-start bowels if constipated until the fiber supplement can help regulate your bowels.  If you are tolerating eating & you are farting, it is okay to try a gentle laxative such as double dose MiraLax, prune juice, or Milk of Magnesia.  Avoid using laxatives too often. °Stool softeners can sometimes help counteract the constipating effects of narcotic pain medicines.  It can also cause diarrhea, so avoid using for too long. °If you are still constipated despite taking fiber daily, eating solids, and a few doses of laxatives, call our office. °Controlling diarrhea °Try drinking liquids and eating bland foods for a few days to avoid stressing your intestines further. °Avoid dairy products (especially milk & ice cream) for a short time.  The intestines often can lose the ability to digest lactose when stressed. °Avoid foods that cause gassiness or  bloating.  Typical foods include beans and other legumes, cabbage, broccoli, and dairy foods.  Avoid greasy, spicy, fast foods.  Every person has   some sensitivity to other foods, so listen to your body and avoid those foods that trigger problems for you. °Probiotics (such as active yogurt, Align, etc) may help repopulate the intestines and colon with normal bacteria and calm down a sensitive digestive tract °Adding a fiber supplement gradually can help thicken stools by absorbing excess fluid and retrain the intestines to act more normally.  Slowly increase the dose over a few weeks.  Too much fiber too soon can backfire and cause cramping & bloating. °It is okay to try and slow down diarrhea with a few doses of antidiarrheal medicines.   °Bismuth subsalicylate (ex. Kayopectate, Pepto Bismol) for a few doses can help control diarrhea.  Avoid if pregnant.   °Loperamide (Imodium) can slow down diarrhea.  Start with one tablet (2mg) first.  Avoid if you are having fevers or severe pain.  °ILEOSTOMY PATIENTS WILL HAVE CHRONIC DIARRHEA since their colon is not in use.    °Drink plenty of liquids.  You will need to drink even more glasses of water/liquid a day to avoid getting dehydrated. °Record output from your ileostomy.  Expect to empty the bag every 3-4 hours at first.  Most people with a permanent ileostomy empty their bag 4-6 times at the least.   °Use antidiarrheal medicine (especially Imodium) several times a day to avoid getting dehydrated.  Start with a dose at bedtime & breakfast.  Adjust up or down as needed.  Increase antidiarrheal medications as directed to avoid emptying the bag more than 8 times a day (every 3 hours). °Work with your wound ostomy nurse to learn care for your ostomy.  See ostomy care instructions. °TROUBLESHOOTING IRREGULAR BOWELS °1) Start with a soft & bland diet. No spicy, greasy, or fried foods.  °2) Avoid gluten/wheat or dairy products from diet to see if symptoms improve. °3) Miralax  17gm or flax seed mixed in 8oz. water or juice-daily. May use 2-4 times a day as needed. °4) Gas-X, Phazyme, etc. as needed for gas & bloating.  °5) Prilosec (omeprazole) over-the-counter as needed °6)  Consider probiotics (Align, Activa, etc) to help calm the bowels down ° °Call your doctor if you are getting worse or not getting better.  Sometimes further testing (cultures, endoscopy, X-ray studies, CT scans, bloodwork, etc.) may be needed to help diagnose and treat the cause of the diarrhea. °[facility] °[address], [address], [place], [place]  [address] °[phone] - Main.    °[phone]  - Toll Free.   [phone] - Fax °[email or web address] ° ° ° °Diverticulosis ° °Diverticulosis is a condition that develops when small pouches (diverticula) form in the wall of the large intestine (colon). The colon is where water is absorbed and stool is formed. The pouches form when the inside layer of the colon pushes through weak spots in the outer layers of the colon. You may have a few pouches or many of them. °What are the causes? °The cause of this condition is not known. °What increases the risk? °The following factors may make you more likely to develop this condition: °· Being older than age 60. Your risk for this condition increases with age. Diverticulosis is rare among people younger than age 30. By age 80, many people have it. °· Eating a low-fiber diet. °· Having frequent constipation. °· Being overweight. °· Not getting enough exercise. °· Smoking. °· Taking over-the-counter pain medicines, like aspirin and ibuprofen. °· Having a family history of diverticulosis. °What are the signs   or symptoms? °In most people, there are no symptoms of this condition. If you do have symptoms, they may include: °· Bloating. °· Cramps in the abdomen. °· Constipation or diarrhea. °· Pain in the lower left side of the abdomen. °How is this diagnosed? °This condition is most often  diagnosed during an exam for other colon problems. Because diverticulosis usually has no symptoms, it often cannot be diagnosed independently. This condition may be diagnosed by: °· Using a flexible scope to examine the colon (colonoscopy). °· Taking an X-ray of the colon after dye has been put into the colon (barium enema). °· Doing a CT scan. °How is this treated? °You may not need treatment for this condition if you have never developed an infection related to diverticulosis. If you have had an infection before, treatment may include: °· Eating a high-fiber diet. This may include eating more fruits, vegetables, and grains. °· Taking a fiber supplement. °· Taking a live bacteria supplement (probiotic). °· Taking medicine to relax your colon. °· Taking antibiotic medicines. °Follow these instructions at home: °· Drink 6-8 glasses of water or more each day to prevent constipation. °· Try not to strain when you have a bowel movement. °· If you have had an infection before: °? Eat more fiber as directed by your health care provider or your diet and nutrition specialist (dietitian). °? Take a fiber supplement or probiotic, if your health care provider approves. °· Take over-the-counter and prescription medicines only as told by your health care provider. °· If you were prescribed an antibiotic, take it as told by your health care provider. Do not stop taking the antibiotic even if you start to feel better. °· Keep all follow-up visits as told by your health care provider. This is important. °Contact a health care provider if: °· You have pain in your abdomen. °· You have bloating. °· You have cramps. °· You have not had a bowel movement in 3 days. °Get help right away if: °· Your pain gets worse. °· Your bloating becomes very bad. °· You have a fever or chills, and your symptoms suddenly get worse. °· You vomit. °· You have bowel movements that are bloody or black. °· You have bleeding from your  rectum. °Summary °· Diverticulosis is a condition that develops when small pouches (diverticula) form in the wall of the large intestine (colon). °· You may have a few pouches or many of them. °· This condition is most often diagnosed during an exam for other colon problems. °· If you have had an infection related to diverticulosis, treatment may include increasing the fiber in your diet, taking supplements, or taking medicines. °This information is not intended to replace advice given to you by your health care provider. Make sure you discuss any questions you have with your health care provider. °Document Released: 11/05/2003 Document Revised: 01/20/2017 Document Reviewed: 12/28/2015 °Elsevier Patient Education © 2020 [employer] ° °

## 2019-01-17 ENCOUNTER — Encounter (HOSPITAL_COMMUNITY): Payer: Self-pay | Admitting: Surgery

## 2019-01-17 LAB — MAGNESIUM: Magnesium: 1.1 mg/dL — ABNORMAL LOW (ref 1.7–2.4)

## 2019-01-17 LAB — BASIC METABOLIC PANEL
Anion gap: 12 (ref 5–15)
BUN: 20 mg/dL (ref 8–23)
CO2: 20 mmol/L — ABNORMAL LOW (ref 22–32)
Calcium: 8.3 mg/dL — ABNORMAL LOW (ref 8.9–10.3)
Chloride: 101 mmol/L (ref 98–111)
Creatinine, Ser: 1.31 mg/dL — ABNORMAL HIGH (ref 0.61–1.24)
GFR calc Af Amer: >60 mL/min (ref >60)
GFR calc non Af Amer: 55 mL/min — ABNORMAL LOW (ref >60)
Glucose, Bld: 173 mg/dL — ABNORMAL HIGH (ref 70–99)
Potassium: 5 mmol/L (ref 3.5–5.1)
Sodium: 133 mmol/L — ABNORMAL LOW (ref 135–145)

## 2019-01-17 LAB — CBC
HCT: 34.2 % — ABNORMAL LOW (ref 39.0–52.0)
HCT: 25.6 % — ABNORMAL LOW (ref 39.0–52.0)
Hemoglobin: 10.9 g/dL — ABNORMAL LOW (ref 13.0–17.0)
Hemoglobin: 8.6 g/dL — ABNORMAL LOW (ref 13.0–17.0)
MCH: 31 pg (ref 26.0–34.0)
MCH: 31.9 pg (ref 26.0–34.0)
MCHC: 31.9 g/dL (ref 30.0–36.0)
MCHC: 33.6 g/dL (ref 30.0–36.0)
MCV: 97.2 fL (ref 80.0–100.0)
MCV: 94.8 fL (ref 80.0–100.0)
Platelets: 262 10*3/uL (ref 150–400)
Platelets: 200 10*3/uL (ref 150–400)
RBC: 3.52 MIL/uL — ABNORMAL LOW (ref 4.22–5.81)
RBC: 2.7 MIL/uL — ABNORMAL LOW (ref 4.22–5.81)
RDW: 12.6 % (ref 11.5–15.5)
RDW: 12.7 % (ref 11.5–15.5)
WBC: 14.5 10*3/uL — ABNORMAL HIGH (ref 4.0–10.5)
WBC: 10.3 10*3/uL (ref 4.0–10.5)
nRBC: 0 % (ref 0.0–0.2)
nRBC: 0 % (ref 0.0–0.2)

## 2019-01-17 MED ORDER — SODIUM CHLORIDE 0.9% FLUSH: 10.0000 mL | INTRAVENOUS | Status: DC | PRN

## 2019-01-17 MED ORDER — SODIUM CHLORIDE 0.9% FLUSH
10.0000 mL | Freq: Two times a day (BID) | INTRAVENOUS | Status: DC
  Administered 2019-01-18: 10 mL

## 2019-01-17 MED ORDER — CHLORHEXIDINE GLUCONATE CLOTH 2 % EX PADS
6.0000 | MEDICATED_PAD | Freq: Every day | CUTANEOUS | Status: DC
  Administered 2019-01-17 – 2019-01-19 (×3): 6 via TOPICAL

## 2019-01-17 NOTE — Progress Notes (Signed)
     Assessment & Plan: POD#1 PROCEDURE:  ROBOTIC LOW ANTERIOR RECTOSIGMOID RESECTION DRAINAGE OF INTRAABDOMINAL ABSCESS ASSESSMENT OF TISSUE PERFUSION USING FIREFLY LYSIS OF ADHESIONS TAP BLOCK - BILATERAL RIGID PROCTOSCOPY  Episode of near syncope last PM, better this AM but hasn't been OOB, up to chair yet Monitor drain output - Hgb 10.9 this AM - check CBC this afternoon and in AM 11/27 Diet per Dr. Johney Maine orders        Armandina Gemma, MD       [facility]       Office: [phone]   Chief Complaint: Diverticular disease with abscess  Subjective: Patient in bed, had near syncopal episode last PM, none since.  Tolerating liquid diet.  Has not been up.  Objective: Vital signs in last 24 hours: Temp:  [97.4 F (36.3 C)-98.7 F (37.1 C)] 98.7 F (37.1 C) (11/26 1043) Pulse Rate:  [76-97] 87 (11/26 1043) Resp:  [12-27] 16 (11/26 1043) BP: (93-137)/(61-88) 99/61 (11/26 1043) SpO2:  [98 %-100 %] 98 % (11/26 1043) Weight:  [84.2 kg] 84.2 kg (11/26 0525) Last BM Date: 01/16/19  Intake/Output from previous day: 11/25 0701 - 11/26 0700 In: 3860.4 [P.O.:700; I.V.:2960.4; IV Piggyback:200] Out: [id number] [Urine:1425; Drains:1015; Blood:100] Intake/Output this shift: Total I/O In: 915.3 [P.O.:480; I.V.:435.3] Out: 170 [Urine:100; Drains:70]  Physical Exam: HEENT - sclerae clear, mucous membranes moist Neck - soft Abdomen - soft, mild distension; wounds dry and intact; JP drain with large serosanguinous in bulb Ext - no edema, non-tender Neuro - alert & oriented, no focal deficits  Lab Results:  Recent Labs    01/17/19 0306  WBC 14.5*  HGB 10.9*  HCT 34.2*  PLT 262   BMET Recent Labs    01/17/19 0306  NA 133*  K 5.0  CL 101  CO2 20*  GLUCOSE 173*  BUN 20  CREATININE 1.31*  CALCIUM 8.3*   PT/INR No results for input(s): LABPROT, INR in the last 72 hours. Comprehensive Metabolic Panel:    Component Value Date/Time   NA 133 (L)  01/17/2019 0306   NA 142 01/14/2019 0859   K 5.0 01/17/2019 0306   K 4.6 01/14/2019 0859   CL 101 01/17/2019 0306   CL 107 01/14/2019 0859   CO2 20 (L) 01/17/2019 0306   CO2 25 01/14/2019 0859   BUN 20 01/17/2019 0306   BUN 15 01/14/2019 0859   CREATININE 1.31 (H) 01/17/2019 0306   CREATININE 0.74 01/14/2019 0859   GLUCOSE 173 (H) 01/17/2019 0306   GLUCOSE 148 (H) 01/14/2019 0859   CALCIUM 8.3 (L) 01/17/2019 0306   CALCIUM 9.7 01/14/2019 0859   AST 17 12/29/2018 0531   AST 26 12/28/2018 1601   ALT 13 12/29/2018 0531   ALT 16 12/28/2018 1601   ALKPHOS 63 12/29/2018 0531   ALKPHOS 88 12/28/2018 1601   BILITOT 0.8 12/29/2018 0531   BILITOT 1.0 12/28/2018 1601   PROT 5.4 (L) 12/29/2018 0531   PROT 7.4 12/28/2018 1601   ALBUMIN 3.0 (L) 12/29/2018 0531   ALBUMIN 4.0 12/28/2018 1601    Studies/Results: No results found.    Armandina Gemma 01/17/2019  Patient ID: [name], male   DOB: [birth year], 68 y.o.   MRN: [id number]

## 2019-01-17 NOTE — Progress Notes (Signed)
Patient was dizzy and became faint on attempt to sit him at bedside.  Patient was assisted back to bed.  Vitals taken at this time shows temp 97.5, Pulse 87, Resp 18, BP 93/67.  O2 at 100% on 2L.  Patient was able to tell us that he felt dizzy and thought he was about to faint.  Pt is now resting and will continue to monitor him during the night.

## 2019-01-18 LAB — CBC
HCT: 23.2 % — ABNORMAL LOW (ref 39.0–52.0)
HCT: 23.1 % — ABNORMAL LOW (ref 39.0–52.0)
Hemoglobin: 7.6 g/dL — ABNORMAL LOW (ref 13.0–17.0)
Hemoglobin: 7.5 g/dL — ABNORMAL LOW (ref 13.0–17.0)
MCH: 31.1 pg (ref 26.0–34.0)
MCH: 31.5 pg (ref 26.0–34.0)
MCHC: 32.8 g/dL (ref 30.0–36.0)
MCHC: 32.5 g/dL (ref 30.0–36.0)
MCV: 95.1 fL (ref 80.0–100.0)
MCV: 97.1 fL (ref 80.0–100.0)
Platelets: 196 10*3/uL (ref 150–400)
Platelets: 202 10*3/uL (ref 150–400)
RBC: 2.44 MIL/uL — ABNORMAL LOW (ref 4.22–5.81)
RBC: 2.38 MIL/uL — ABNORMAL LOW (ref 4.22–5.81)
RDW: 12.7 % (ref 11.5–15.5)
RDW: 12.8 % (ref 11.5–15.5)
WBC: 10.4 10*3/uL (ref 4.0–10.5)
WBC: 10.6 10*3/uL — ABNORMAL HIGH (ref 4.0–10.5)
nRBC: 0 % (ref 0.0–0.2)
nRBC: 0 % (ref 0.0–0.2)

## 2019-01-18 NOTE — Progress Notes (Signed)
     Assessment & Plan: POD#2 PROCEDURE:  ROBOTICLOW ANTERIOR RECTOSIGMOIDRESECTION DRAINAGE OF INTRAABDOMINAL ABSCESS ASSESSMENT OF TISSUE PERFUSION USING FIREFLY LYSIS OF ADHESIONS TAP BLOCK - BILATERAL RIGID PROCTOSCOPY  Hgb decreased to 7.6 overnight - hold Lovenox and ASA today Repeat CBC at 4PM and in AM 11/28 - may require transfusion if continues to fall Monitor drain output - sanguinous this AM Having liquid BM's - advance to full liquid diet today        Armandina Gemma, MD       [facility]       Office: [phone]   Chief Complaint: Complicated diverticular disease  Subjective: Patient in bed, comfortable.  Loose BM's this AM.  Wants to eat.  Objective: Vital signs in last 24 hours: Temp:  [97.4 F (36.3 C)-98.7 F (37.1 C)] 97.9 F (36.6 C) (11/27 0621) Pulse Rate:  [67-91] 67 (11/27 0621) Resp:  [16] 16 (11/27 0621) BP: (86-109)/(51-68) 90/51 (11/27 0621) SpO2:  [91 %-98 %] 94 % (11/27 0621) Weight:  [82.5 kg] 82.5 kg (11/27 0620) Last BM Date: 01/16/19  Intake/Output from previous day: 11/26 0701 - 11/27 0700 In: 4667.8 [P.O.:2940; I.V.:1727.8] Out: 2500 [Urine:2275; Drains:225] Intake/Output this shift: No intake/output data recorded.  Physical Exam: HEENT - sclerae clear, mucous membranes moist Neck - soft Chest - clear bilaterally Cor - RRR Abdomen - soft, mild distension; sanguinous output from JP drain Ext - no edema, non-tender Neuro - alert & oriented, no focal deficits  Lab Results:  Recent Labs    01/17/19 1642 01/18/19 0422  WBC 10.3 10.4  HGB 8.6* 7.6*  HCT 25.6* 23.2*  PLT 200 196   BMET Recent Labs    01/17/19 0306  NA 133*  K 5.0  CL 101  CO2 20*  GLUCOSE 173*  BUN 20  CREATININE 1.31*  CALCIUM 8.3*   PT/INR No results for input(s): LABPROT, INR in the last 72 hours. Comprehensive Metabolic Panel:    Component Value Date/Time   NA 133 (L) 01/17/2019 0306   NA 142 01/14/2019 0859   K 5.0 01/17/2019 0306   K 4.6 01/14/2019 0859   CL 101 01/17/2019 0306   CL 107 01/14/2019 0859   CO2 20 (L) 01/17/2019 0306   CO2 25 01/14/2019 0859   BUN 20 01/17/2019 0306   BUN 15 01/14/2019 0859   CREATININE 1.31 (H) 01/17/2019 0306   CREATININE 0.74 01/14/2019 0859   GLUCOSE 173 (H) 01/17/2019 0306   GLUCOSE 148 (H) 01/14/2019 0859   CALCIUM 8.3 (L) 01/17/2019 0306   CALCIUM 9.7 01/14/2019 0859   AST 17 12/29/2018 0531   AST 26 12/28/2018 1601   ALT 13 12/29/2018 0531   ALT 16 12/28/2018 1601   ALKPHOS 63 12/29/2018 0531   ALKPHOS 88 12/28/2018 1601   BILITOT 0.8 12/29/2018 0531   BILITOT 1.0 12/28/2018 1601   PROT 5.4 (L) 12/29/2018 0531   PROT 7.4 12/28/2018 1601   ALBUMIN 3.0 (L) 12/29/2018 0531   ALBUMIN 4.0 12/28/2018 1601    Studies/Results: No results found.    Armandina Gemma 01/18/2019  Patient ID: Ray Brown, male   DOB: January 22, 1951, 68 y.o.   MRN: [id number]

## 2019-01-19 LAB — CBC
HCT: 22.8 % — ABNORMAL LOW (ref 39.0–52.0)
Hemoglobin: 7.4 g/dL — ABNORMAL LOW (ref 13.0–17.0)
MCH: 31.5 pg (ref 26.0–34.0)
MCHC: 32.5 g/dL (ref 30.0–36.0)
MCV: 97 fL (ref 80.0–100.0)
Platelets: 193 10*3/uL (ref 150–400)
RBC: 2.35 MIL/uL — ABNORMAL LOW (ref 4.22–5.81)
RDW: 13 % (ref 11.5–15.5)
WBC: 9.1 10*3/uL (ref 4.0–10.5)
nRBC: 0 % (ref 0.0–0.2)

## 2019-01-19 NOTE — Progress Notes (Signed)
     Assessment & Plan: POD#3 PROCEDURE:  ROBOTICLOW ANTERIOR RECTOSIGMOIDRESECTION DRAINAGE OF INTRAABDOMINAL ABSCESS ASSESSMENT OF TISSUE PERFUSION USING FIREFLY LYSIS OF ADHESIONS TAP BLOCK - BILATERAL RIGID PROCTOSCOPY  Hgb stable at 7.4 overnight Drain more serosanguinous this AM - will leave in at discharge and remove in office next week Tolerating diet, having BM's Plan discharge home today with drain in place        Armandina Gemma, MD       [facility]       Office: [phone]   Chief Complaint: Complex diverticular disease  Subjective: Patient ambulatory, taking po diet.  Pain controlled.  Hgb stable.  Wants to go home.  Objective: Vital signs in last 24 hours: Temp:  [97.6 F (36.4 C)-98 F (36.7 C)] 98 F (36.7 C) (11/28 0532) Pulse Rate:  [72-80] 80 (11/28 0532) Resp:  [15-18] 15 (11/28 0532) BP: (103-129)/(51-57) 103/54 (11/28 0532) SpO2:  [93 %-99 %] 93 % (11/28 0532) Weight:  [83.2 kg] 83.2 kg (11/28 0532) Last BM Date: 01/18/19  Intake/Output from previous day: 11/27 0701 - 11/28 0700 In: 640 [P.O.:560; I.V.:80] Out: 2465 [Urine:2350; Drains:115] Intake/Output this shift: No intake/output data recorded.  Physical Exam: HEENT - sclerae clear, mucous membranes moist Neck - soft Chest - clear bilaterally Cor - RRR Abdomen - soft, wounds dry and intact; JP with serosanguinous output; active BS present Ext - no edema, non-tender Neuro - alert & oriented, no focal deficits  Lab Results:  Recent Labs    01/18/19 1700 01/19/19 0405  WBC 10.6* 9.1  HGB 7.5* 7.4*  HCT 23.1* 22.8*  PLT 202 193   BMET Recent Labs    01/17/19 0306  NA 133*  K 5.0  CL 101  CO2 20*  GLUCOSE 173*  BUN 20  CREATININE 1.31*  CALCIUM 8.3*   PT/INR No results for input(s): LABPROT, INR in the last 72 hours. Comprehensive Metabolic Panel:    Component Value Date/Time   NA 133 (L) 01/17/2019 0306   NA 142 01/14/2019 0859   K 5.0  01/17/2019 0306   K 4.6 01/14/2019 0859   CL 101 01/17/2019 0306   CL 107 01/14/2019 0859   CO2 20 (L) 01/17/2019 0306   CO2 25 01/14/2019 0859   BUN 20 01/17/2019 0306   BUN 15 01/14/2019 0859   CREATININE 1.31 (H) 01/17/2019 0306   CREATININE 0.74 01/14/2019 0859   GLUCOSE 173 (H) 01/17/2019 0306   GLUCOSE 148 (H) 01/14/2019 0859   CALCIUM 8.3 (L) 01/17/2019 0306   CALCIUM 9.7 01/14/2019 0859   AST 17 12/29/2018 0531   AST 26 12/28/2018 1601   ALT 13 12/29/2018 0531   ALT 16 12/28/2018 1601   ALKPHOS 63 12/29/2018 0531   ALKPHOS 88 12/28/2018 1601   BILITOT 0.8 12/29/2018 0531   BILITOT 1.0 12/28/2018 1601   PROT 5.4 (L) 12/29/2018 0531   PROT 7.4 12/28/2018 1601   ALBUMIN 3.0 (L) 12/29/2018 0531   ALBUMIN 4.0 12/28/2018 1601    Studies/Results: No results found.    Armandina Gemma 01/19/2019  Patient ID: Ray Brown, male   DOB: [birth year], 68 y.o.   MRN: [id number]

## 2019-01-19 NOTE — Plan of Care (Signed)

## 2019-01-19 NOTE — Progress Notes (Signed)
Discharge paperwork discussed with pt and wife at the bedside.  RN demonstrated drain dressing change, as well as drain care.  Dressings were removed per MD order. Pt was escorted by wheelchair in stable condition to main lobby.

## 2019-01-21 LAB — SURGICAL PATHOLOGY

## 2019-01-22 NOTE — Discharge Summary (Signed)
Physician Discharge Summary    Patient ID: Ray Brown MRN: [id number] DOB/AGE: 1950-12-03  68 y.o.  Patient Care Team: [name], MD as PCP - General (Family Medicine) [name], MD as PCP - Cardiology (Cardiology) Early, [name], MD as Consulting Physician (Vascular Surgery) [name], MD as Consulting Physician (Orthopedic Surgery) [name], MD as Consulting Physician (Cardiology) [name], MD as Consulting Physician (Colon and Rectal Surgery) [name], MD as Consulting Physician (Gastroenterology)  Admit date: 01/16/2019  Discharge date: 01/19/2019 Hospital Stay = 3 days    Discharge Diagnoses:  Principal Problem:   Diverticulitis with abscess s/p robotic LAR rectosigmoid resection 01/16/2019 Active Problems:   Abscess of sigmoid colon due to diverticulitis   Obesity   Diabetes mellitus with peripheral artery disease (HCC)   BPPV (benign paroxysmal positional vertigo)   Chronic anticoagulation   Chronic back pain   Status post aortobifemoral bypass surgery 2013   6 Days Post-Op  01/16/2019  POST-OPERATIVE DIAGNOSIS:   diverticulitis  SURGERY:  01/16/2019  Procedure(s): XI ROBOTIC RESECTION OF SIGMOID COLON LYSIS OF ADHESIONS RIGID PROCTOSCOPY  SURGEON:    Surgeon(s): [name], MD Ileana Roup, MD  Consults: None  Hospital Course:   The patient underwent the surgery above.  Postoperatively, the patient gradually mobilized and advanced to a solid diet.  Pain and other symptoms were treated aggressively.  Postoperative anemia stable without decondition  By the time of discharge, the patient was walking well the hallways, eating food, having flatus.  Pain was well-controlled on an oral medications.  Based on meeting discharge criteria and continuing to recover, I felt it was safe for the patient to be discharged from the hospital to further recover with close followup. Postoperative recommendations were  discussed in detail.  They are written as well.  Discharged Condition: good  Discharge Exam: Blood pressure (!) 112/54, pulse 87, temperature 98.6 F (37 C), temperature source Oral, resp. rate 18, height 5\' 8"  (1.727 m), weight 83.2 kg, SpO2 94 %.  General: Pt awake/alert/oriented x4 in No acute distress Eyes: PERRL, normal EOM.  Sclera clear.  No icterus Neuro: CN II-XII intact w/o focal sensory/motor deficits. Lymph: No head/neck/groin lymphadenopathy Psych:  No delerium/psychosis/paranoia HENT: Normocephalic, Mucus membranes moist.  No thrush Neck: Supple, No tracheal deviation Chest: No chest wall pain w good excursion CV:  Pulses intact.  Regular rhythm MS: Normal AROM mjr joints.  No obvious deformity Abdomen: Soft.  Nondistended.  Mildly tender at incisions only.  No evidence of peritonitis.  No incarcerated hernias. Ext:  SCDs BLE.  No mjr edema.  No cyanosis Skin: No petechiae / purpura   Disposition:   Follow-up Information    [name], MD. Schedule an appointment as soon as possible for a visit in 3 weeks.   Specialty: General Surgery Why: To follow up after your operation, To follow up after your hospital stay Contact information: [place] [address] [phone]             Discharge Instructions    Call MD for:   Complete by: As directed    FEVER > 101.5 F  (temperatures < 101.5 F are not significant)   Call MD for:  extreme fatigue   Complete by: As directed    Call MD for:  persistant dizziness or light-headedness   Complete by: As directed    Call MD for:  persistant nausea and vomiting   Complete by: As directed  Call MD for:  redness, tenderness, or signs of infection (pain, swelling, redness, odor or green/yellow discharge around incision site)   Complete by: As directed    Call MD for:  severe uncontrolled pain   Complete by: As directed    Diet - low sodium heart healthy   Complete by: As directed     Start with a bland diet such as soups, liquids, starchy foods, low fat foods, etc. the first few days at home. Gradually advance to a solid, low-fat, high fiber diet by the end of the first week at home.   Add a fiber supplement to your diet (Metamucil, etc) If you feel full, bloated, or constipated, stay on a full liquid or pureed/blenderized diet for a few days until you feel better and are no longer constipated.   Diet - low sodium heart healthy   Complete by: As directed    Discharge instructions   Complete by: As directed    See Discharge Instructions If you are not getting better after two weeks or are noticing you are getting worse, contact our office [phone] for further advice.  We may need to adjust your medications, re-evaluate you in the office, send you to the emergency room, or see what other things we can do to help. The clinic staff is available to answer your questions during regular business hours (8:30am-5pm).  Please don't hesitate to call and ask to speak to one of our nurses for clinical concerns.    A surgeon from [facility] is always on call at the hospitals 24 hours/day If you have a medical emergency, go to the nearest emergency room or call 911.   Discharge wound care:   Complete by: As directed    It is good for closed incisions and even open wounds to be washed every day.  Shower every day.  Short baths are fine.  Wash the incisions and wounds clean with soap & water.    You may leave closed incisions open to air if it is dry.   You may cover the incision with clean gauze & replace it after your daily shower for comfort.   Discharge wound care:   Complete by: As directed    May remove all dressings.  Keep dry gauze dressing around drain site.  May change daily.  May shower.   Driving Restrictions   Complete by: As directed    You may drive when: - you are no longer taking narcotic prescription pain medication - you can comfortably wear a  seatbelt - you can safely make sudden turns/stops without pain.   Increase activity slowly   Complete by: As directed    Start light daily activities --- self-care, walking, climbing stairs- beginning the day after surgery.  Gradually increase activities as tolerated.  Control your pain to be active.  Stop when you are tired.  Ideally, walk several times a day, eventually an hour a day.   Most people are back to most day-to-day activities in a few weeks.  It takes 4-6 weeks to get back to unrestricted, intense activity. If you can walk 30 minutes without difficulty, it is safe to try more intense activity such as jogging, treadmill, bicycling, low-impact aerobics, swimming, etc. Save the most intensive and strenuous activity for last (Usually 4-8 weeks after surgery) such as sit-ups, heavy lifting, contact sports, etc.  Refrain from any intense heavy lifting or straining until you are off narcotics for pain control.  You will  have off days, but things should improve week-by-week. DO NOT PUSH THROUGH PAIN.  Let pain be your guide: If it hurts to do something, don't do it.   Increase activity slowly   Complete by: As directed    Lifting restrictions   Complete by: As directed    If you can walk 30 minutes without difficulty, it is safe to try more intense activity such as jogging, treadmill, bicycling, low-impact aerobics, swimming, etc. Save the most intensive and strenuous activity for last (Usually 4-8 weeks after surgery) such as sit-ups, heavy lifting, contact sports, etc.   Refrain from any intense heavy lifting or straining until you are off narcotics for pain control.  You will have off days, but things should improve week-by-week. DO NOT PUSH THROUGH PAIN.  Let pain be your guide: If it hurts to do something, don't do it.  Pain is your body warning you to avoid that activity for another week until the pain goes down.   May shower / Bathe   Complete by: As directed    May walk up steps    Complete by: As directed    Remove dressing in 72 hours   Complete by: As directed    Make sure all dressings are removed by the third day after surgery.  Leave incisions open to air.  OK to cover incisions with gauze or bandages as desired   Sexual Activity Restrictions   Complete by: As directed    You may have sexual intercourse when it is comfortable. If it hurts to do something, stop.      Allergies as of 01/19/2019      Reactions   Ciprofloxacin Rash   Daypro [oxaprozin] Rash      Medication List    TAKE these medications   aspirin EC 81 MG tablet Take 81 mg by mouth at bedtime.   atorvastatin 80 MG tablet Commonly known as: LIPITOR TAKE 1 TABLET BY MOUTH AT  BEDTIME What changed: when to take this   clopidogrel 75 MG tablet Commonly known as: PLAVIX TAKE 1 TABLET BY MOUTH AT  BEDTIME What changed: when to take this   fenofibrate 160 MG tablet Take 160 mg by mouth at bedtime.   hydrocortisone 2.5 % rectal cream Commonly known as: ANUSOL-HC Apply 1 application topically 4 (four) times daily as needed for hemorrhoids.   magnesium oxide 400 (241.3 Mg) MG tablet Commonly known as: MAG-OX Take 1 tablet (400 mg total) by mouth daily.   metoprolol succinate 25 MG 24 hr tablet Commonly known as: TOPROL-XL TAKE 1 TABLET BY MOUTH  DAILY   niacin 750 MG CR tablet Commonly known as: NIASPAN Take 1,500 mg by mouth at bedtime.   nitroGLYCERIN 0.4 MG SL tablet Commonly known as: NITROSTAT Place 0.4 mg under the tongue every 5 (five) minutes as needed for chest pain.   ondansetron 4 MG disintegrating tablet Commonly known as: ZOFRAN-ODT Take 4 mg by mouth every 8 (eight) hours as needed for nausea/vomiting.   pantoprazole 40 MG tablet Commonly known as: PROTONIX Take 40 mg by mouth at bedtime.   polyethylene glycol 17 g packet Commonly known as: MIRALAX / GLYCOLAX Take 17 g by mouth 2 (two) times daily. What changed: when to take this   traMADol 50 MG  tablet Commonly known as: ULTRAM Take 1-2 tablets (50-100 mg total) by mouth every 6 (six) hours as needed for moderate pain or severe pain.  Discharge Care Instructions  (From admission, onward)         Start     Ordered   01/19/19 0000  Discharge wound care:    Comments: May remove all dressings.  Keep dry gauze dressing around drain site.  May change daily.  May shower.   01/19/19 0742   01/16/19 0000  Discharge wound care:    Comments: It is good for closed incisions and even open wounds to be washed every day.  Shower every day.  Short baths are fine.  Wash the incisions and wounds clean with soap & water.    You may leave closed incisions open to air if it is dry.   You may cover the incision with clean gauze & replace it after your daily shower for comfort.   01/16/19 1240          Significant Diagnostic Studies:  No results found for this or any previous visit (from the past 72 hour(s)).  Dg Chest 2 View  Result Date: 01/14/2019 CLINICAL DATA:  Preoperative assessment for bowel resection EXAM: CHEST - 2 VIEW COMPARISON:  June 03, 2015 FINDINGS: Lungs are clear. Heart size and pulmonary vascularity are normal. No adenopathy. There is mild degenerative change in the thoracic spine. IMPRESSION: No edema or consolidation. Electronically Signed   By: Lowella Grip III M.D.   On: 01/14/2019 09:29    Past Medical History:  Diagnosis Date  . Arthritis   . Bronchitis   . Chronic back pain   . Complication of anesthesia    difficulty breathing s/p back surgery at [facility] 2013  . Coronary artery disease   . Diabetes mellitus without complication ([place])   . Dry skin    in the back of head-left side  . GERD (gastroesophageal reflux disease)   . H/O hiatal hernia   . Hypercholesteremia   . Peripheral vascular disease ([place])   . Pneumonia   . Shortness of breath   . Sleep apnea    has CPAP but does not use  . Unstable angina ([place]) 12/05/2014    Past  Surgical History:  Procedure Laterality Date  . AORTA - BILATERAL FEMORAL ARTERY BYPASS GRAFT  11/27/2001   Dr Sherren Mocha Early.  Aortobifemoral bypass with a 14 x 8 Hemashield graft.  Marland Kitchen BACK SURGERY  2004  . CARDIAC CATHETERIZATION  2010   x 2 stents RCA  . CARDIAC CATHETERIZATION N/A 12/08/2014   Procedure: Left Heart Cath and Coronary Angiography;  Surgeon: Lorretta Harp, MD;  Location: [facility];  Service: Cardiovascular;  Laterality: N/A;  . COLONOSCOPY  08/04/2009  . CORONARY ANGIOPLASTY     2 stents  . FOOT SURGERY  1980s   right foot  . LYSIS OF ADHESION N/A 01/16/2019   Procedure: LYSIS OF ADHESIONS;  Surgeon: [name], MD;  Location: [facility];  Service: General;  Laterality: N/A;  . PROCTOSCOPY N/A 01/16/2019   Procedure: RIGID PROCTOSCOPY;  Surgeon: [name], MD;  Location: [facility];  Service: General;  Laterality: N/A;  . SHOULDER ARTHROSCOPY WITH SUBACROMIAL DECOMPRESSION Right 03/28/2013   Procedure: RIGHT SHOULDER ARTHROSCOPY WITH SUBACROMIAL DECOMPRESSION AND DISTAL CLAVICLE RESECTION;  Surgeon: Marin Shutter, MD;  Location: [facility];  Service: Orthopedics;  Laterality: Right;  . SHOULDER SURGERY     left shoulder rotator cuff repair  . UPPER GI ENDOSCOPY    . XI ROBOTIC ASSISTED LOWER ANTERIOR RESECTION N/A 01/16/2019   Procedure: XI ROBOTIC RESECTION OF SIGMOID COLON;  Surgeon: [name], MD;  Location: [facility];  Service: General;  Laterality: N/A;    Social History   Socioeconomic History  . Marital status: Married    Spouse name: Not on file  . Number of children: Not on file  . Years of education: Not on file  . Highest education level: Not on file  Occupational History  . Occupation: retired  Scientific laboratory technician  . Financial resource strain: Not on file  . Food insecurity    Worry: Not on file    Inability: Not on file  . Transportation needs    Medical: Not on file    Non-medical: Not on file  Tobacco Use  . Smoking status: Former Smoker     Years: 35.00    Types: Cigarettes  . Smokeless tobacco: Never Used  Substance and Sexual Activity  . Alcohol use: No  . Drug use: No  . Sexual activity: Not on file  Lifestyle  . Physical activity    Days per week: Not on file    Minutes per session: Not on file  . Stress: Not on file  Relationships  . Social Herbalist on phone: Not on file    Gets together: Not on file    Attends religious service: Not on file    Active member of club or organization: Not on file    Attends meetings of clubs or organizations: Not on file    Relationship status: Not on file  . Intimate partner violence    Fear of current or ex partner: Not on file    Emotionally abused: Not on file    Physically abused: Not on file    Forced sexual activity: Not on file  Other Topics Concern  . Not on file  Social History Narrative  . Not on file    Family History  Problem Relation Age of Onset  . Colon cancer Mother   . Heart attack Father        , massive  . Coronary artery disease Brother   . Thyroid cancer Brother     No current facility-administered medications for this encounter.    Current Outpatient Medications  Medication Sig Dispense Refill  . atorvastatin (LIPITOR) 80 MG tablet TAKE 1 TABLET BY MOUTH AT  BEDTIME (Patient taking differently: Take 80 mg by mouth daily at 6 PM. ) 90 tablet 3  . fenofibrate 160 MG tablet Take 160 mg by mouth at bedtime.    . magnesium oxide (MAG-OX) 400 (241.3 Mg) MG tablet Take 1 tablet (400 mg total) by mouth daily. 30 tablet 2  . metoprolol succinate (TOPROL-XL) 25 MG 24 hr tablet TAKE 1 TABLET BY MOUTH  DAILY (Patient taking differently: Take 25 mg by mouth daily. ) 90 tablet 3  . niacin (NIASPAN) 750 MG CR tablet Take 1,500 mg by mouth at bedtime.    . pantoprazole (PROTONIX) 40 MG tablet Take 40 mg by mouth at bedtime.    . polyethylene glycol (MIRALAX / GLYCOLAX) 17 g packet Take 17 g by mouth 2 (two) times daily. (Patient taking differently:  Take 17 g by mouth daily. ) 14 each 0  . aspirin EC 81 MG tablet Take 81 mg by mouth at bedtime.    . clopidogrel (PLAVIX) 75 MG tablet TAKE 1 TABLET BY MOUTH AT  BEDTIME (Patient taking differently: Take 75 mg by mouth daily. ) 90 tablet 3  . hydrocortisone (ANUSOL-HC) 2.5 % rectal cream Apply 1 application  topically 4 (four) times daily as needed for hemorrhoids. (Patient not taking: Reported on 01/07/2019) 30 g 0  . nitroGLYCERIN (NITROSTAT) 0.4 MG SL tablet Place 0.4 mg under the tongue every 5 (five) minutes as needed for chest pain.    Marland Kitchen ondansetron (ZOFRAN-ODT) 4 MG disintegrating tablet Take 4 mg by mouth every 8 (eight) hours as needed for nausea/vomiting.    . traMADol (ULTRAM) 50 MG tablet Take 1-2 tablets (50-100 mg total) by mouth every 6 (six) hours as needed for moderate pain or severe pain. 20 tablet 0     Allergies  Allergen Reactions  . Ciprofloxacin Rash  . Daypro [Oxaprozin] Rash    Signed: Morton Peters, MD, FACS, MASCRS Gastrointestinal and Minimally Invasive Surgery  [facility] [address] [place], [place] [address] [phone] Main / Paging [phone] Fax     01/22/2019, 8:27 AM

## 2019-01-31 ENCOUNTER — Emergency Department (HOSPITAL_COMMUNITY): Payer: Medicare Other

## 2019-01-31 ENCOUNTER — Emergency Department (HOSPITAL_COMMUNITY)
Admission: EM | Admit: 2019-01-31 | Discharge: 2019-01-31 | Disposition: A | Discharge status: Home / Self Care | Payer: Medicare Other | Attending: Emergency Medicine | Admitting: Emergency Medicine

## 2019-01-31 ENCOUNTER — Encounter (HOSPITAL_COMMUNITY): Payer: Self-pay | Admitting: Emergency Medicine

## 2019-01-31 ENCOUNTER — Telehealth: Payer: Self-pay | Admitting: Cardiology

## 2019-01-31 ENCOUNTER — Other Ambulatory Visit: Payer: Self-pay

## 2019-01-31 DIAGNOSIS — I2510 Atherosclerotic heart disease of native coronary artery without angina pectoris: Secondary | ICD-10-CM | POA: Diagnosis not present

## 2019-01-31 DIAGNOSIS — E119 Type 2 diabetes mellitus without complications: Secondary | ICD-10-CM | POA: Diagnosis not present

## 2019-01-31 DIAGNOSIS — R109 Unspecified abdominal pain: Secondary | ICD-10-CM | POA: Diagnosis not present

## 2019-01-31 DIAGNOSIS — Z7982 Long term (current) use of aspirin: Secondary | ICD-10-CM | POA: Diagnosis not present

## 2019-01-31 DIAGNOSIS — R0602 Shortness of breath: Secondary | ICD-10-CM | POA: Diagnosis present

## 2019-01-31 DIAGNOSIS — Z20828 Contact with and (suspected) exposure to other viral communicable diseases: Secondary | ICD-10-CM | POA: Insufficient documentation

## 2019-01-31 DIAGNOSIS — Z9889 Other specified postprocedural states: Secondary | ICD-10-CM | POA: Insufficient documentation

## 2019-01-31 DIAGNOSIS — Z87891 Personal history of nicotine dependence: Secondary | ICD-10-CM | POA: Diagnosis not present

## 2019-01-31 LAB — CBC
HCT: 38.4 % — ABNORMAL LOW (ref 39.0–52.0)
Hemoglobin: 12 g/dL — ABNORMAL LOW (ref 13.0–17.0)
MCH: 30.6 pg (ref 26.0–34.0)
MCHC: 31.3 g/dL (ref 30.0–36.0)
MCV: 98 fL (ref 80.0–100.0)
Platelets: 460 10*3/uL — ABNORMAL HIGH (ref 150–400)
RBC: 3.92 MIL/uL — ABNORMAL LOW (ref 4.22–5.81)
RDW: 14.9 % (ref 11.5–15.5)
WBC: 8.7 10*3/uL (ref 4.0–10.5)
nRBC: 0 % (ref 0.0–0.2)

## 2019-01-31 LAB — COMPREHENSIVE METABOLIC PANEL
ALT: 21 U/L (ref 0–44)
AST: 37 U/L (ref 15–41)
Albumin: 3.7 g/dL (ref 3.5–5.0)
Alkaline Phosphatase: 92 U/L (ref 38–126)
Anion gap: 12 (ref 5–15)
BUN: 15 mg/dL (ref 8–23)
CO2: 25 mmol/L (ref 22–32)
Calcium: 9.7 mg/dL (ref 8.9–10.3)
Chloride: 103 mmol/L (ref 98–111)
Creatinine, Ser: 0.92 mg/dL (ref 0.61–1.24)
GFR calc Af Amer: >60 mL/min (ref >60)
GFR calc non Af Amer: >60 mL/min (ref >60)
Glucose, Bld: 125 mg/dL — ABNORMAL HIGH (ref 70–99)
Potassium: 3.8 mmol/L (ref 3.5–5.1)
Sodium: 140 mmol/L (ref 135–145)
Total Bilirubin: 1 mg/dL (ref 0.3–1.2)
Total Protein: 6.9 g/dL (ref 6.5–8.1)

## 2019-01-31 LAB — URINALYSIS, ROUTINE W REFLEX MICROSCOPIC
Bilirubin Urine: NEGATIVE
Glucose, UA: NEGATIVE mg/dL
Hgb urine dipstick: NEGATIVE
Ketones, ur: NEGATIVE mg/dL
Leukocytes,Ua: NEGATIVE
Nitrite: NEGATIVE
Protein, ur: NEGATIVE mg/dL
Specific Gravity, Urine: 1.012 (ref 1.005–1.030)
pH: 7 (ref 5.0–8.0)

## 2019-01-31 LAB — LIPASE, BLOOD: Lipase: 21 U/L (ref 11–51)

## 2019-01-31 LAB — POC SARS CORONAVIRUS 2 AG -  ED: SARS Coronavirus 2 Ag: NEGATIVE

## 2019-01-31 LAB — TROPONIN I (HIGH SENSITIVITY): Troponin I (High Sensitivity): <2 ng/L (ref <18)

## 2019-01-31 LAB — BRAIN NATRIURETIC PEPTIDE: B Natriuretic Peptide: 15.1 pg/mL (ref 0.0–100.0)

## 2019-01-31 MED ORDER — IOHEXOL 350 MG/ML SOLN
100.0000 mL | Freq: Once | INTRAVENOUS | Status: AC | PRN
  Administered 2019-01-31: 100 mL via INTRAVENOUS

## 2019-01-31 NOTE — ED Notes (Signed)
Obtained blue top, lactic, and 1 set of cultures with IV start

## 2019-01-31 NOTE — Telephone Encounter (Signed)
Wife Called on behalf of patient.  Pt c/o Shortness Of Breath: STAT if SOB developed within the last 24 hours or pt is noticeably SOB on the phone  1. Are you currently SOB (can you hear that pt is SOB on the phone)? yes  2. How long have you been experiencing SOB? About 2 weeks  3. Are you SOB when sitting or when up moving around? All the time. It is worse when he walks from one room to the next  4. Are you currently experiencing any other symptoms? Dizziness, no appetite, no energy  STAT if patient feels like he/she is going to faint   1) Are you dizzy now? No, just when he gets up to walk  2) Do you feel faint or have you passed out? no  3) Do you have any other symptoms? SOB  4) Have you checked your HR and BP (record if available)? 110/70  HR 80 BP was taken about 30 minutes ago  Wife mentioned that the patient did have colon surgery and got discharged from the hospital on 11/28. SOB has been consistent since coming home, but it gets worse when he gets up to start moving around. BP is good, no fever, no swelling. Wife just does not know what to do

## 2019-01-31 NOTE — ED Triage Notes (Signed)
Pt reports since he went home from hospital from having colon resection on 11/28, having continuous SOB that is worse with exertion. Patient also having little lower abd pains.

## 2019-01-31 NOTE — Telephone Encounter (Signed)
On discharge, his hemoglobin was 7.4.  I think he should probably go to the [facility] emergency room for further evaluation, question continued anemia or other potential causes for shortness of breath. Candee Furbish, MD

## 2019-01-31 NOTE — ED Notes (Signed)
Pt transported to x-ray from triage. Pt will attempt to give urine specimen once finished with x-ray.

## 2019-01-31 NOTE — ED Provider Notes (Signed)
[facility] DEPT Provider Note   CSN: [id number] Arrival date & time: 01/31/19  1759     History Chief Complaint  Patient presents with  . Shortness of Breath  . Abdominal Pain  . Post-op Problem    Ray Brown is a 68 y.o. male.  HPI   This is a pleasant 68 yo gentleman w/ a hx of recent diverticulitis w/ abscess s/p robotoic LAR rectosigmoid resection (01/16/2019), aortobifemoral bypass surgery, DM2, presenting to the ED with shortness of breath.  The apteint reports he has had constant SOB since his discharge from the hospital on 01/19/2019.  He believes he has felt this way since his operation.  He has a sensation that he cannot get in enough air.  He also feels tired and has difficulty getting around the house and performing his usual activities.  He reports a chronic cough, but denies fevers or chills. He denies chest pain or pressure.  He denies hx of DVT or PE. He has a hx of cardiac stent and is on aspirin and plavix.  He is not an active smoker.  He reports no hx of COPD.  His medical history shows briefly a hx of "difficulty breathing after a back surgery in 2013."     Past Medical History:  Diagnosis Date  . Arthritis   . Bronchitis   . Chronic back pain   . Complication of anesthesia    difficulty breathing s/p back surgery at [facility] 2013  . Coronary artery disease   . Diabetes mellitus without complication ([place])   . Dry skin    in the back of head-left side  . GERD (gastroesophageal reflux disease)   . H/O hiatal hernia   . Hypercholesteremia   . Peripheral vascular disease ([place])   . Pneumonia   . Shortness of breath   . Sleep apnea    has CPAP but does not use  . Unstable angina ([place]) 12/05/2014    Patient Active Problem List   Diagnosis Date Noted  . Diverticulitis with abscess s/p robotic LAR rectosigmoid resection 01/16/2019 01/16/2019  . Hypomagnesemia 12/29/2018  . Protein-calorie malnutrition, moderate  ([place]) 12/29/2018  . Chronic anticoagulation 12/28/2018  . Coronary stent 2008  restenosis due to progression of disease s/p restent 2010 12/28/2018  . Status post aortobifemoral bypass surgery 2013 12/28/2018  . Left-sided low back pain with left-sided sciatica 12/28/2018  . Nausea & vomiting 12/28/2018  . Hypokalemia 12/28/2018  . Chronic back pain   . BPPV (benign paroxysmal positional vertigo) 11/10/2018  . Abscess of sigmoid colon due to diverticulitis 11/03/2018  . Dyslipidemia 11/03/2018  . OSA (obstructive sleep apnea) 11/03/2018  . Pain of right shoulder joint on movement 06/20/2018  . Dyspnea 12/05/2014  . Dizziness 12/05/2014  . Diabetes mellitus with peripheral artery disease ([place]) 10/04/2013  . Coronary artery disease involving native coronary artery of native heart without angina pectoris 10/04/2013  . Impingement syndrome of right shoulder 03/28/2013  . Obesity 01/21/2013  . Peripheral vascular disease [facility])     Past Surgical History:  Procedure Laterality Date  . AORTA - BILATERAL FEMORAL ARTERY BYPASS GRAFT  11/27/2001   Dr Sherren Mocha Early.  Aortobifemoral bypass with a 14 x 8 Hemashield graft.  Marland Kitchen BACK SURGERY  2004  . CARDIAC CATHETERIZATION  2010   x 2 stents RCA  . CARDIAC CATHETERIZATION N/A 12/08/2014   Procedure: Left Heart Cath and Coronary Angiography;  Surgeon: Lorretta Harp, MD;  Location: [facility] CV  LAB;  Service: Cardiovascular;  Laterality: N/A;  . COLONOSCOPY  08/04/2009  . CORONARY ANGIOPLASTY     2 stents  . FOOT SURGERY  1980s   right foot  . LYSIS OF ADHESION N/A 01/16/2019   Procedure: LYSIS OF ADHESIONS;  Surgeon: [name], MD;  Location: [facility] OR[facility];  Service: General;  Laterality: N/A;  . PROCTOSCOPY N/A 01/16/2019   Procedure: RIGID PROCTOSCOPY;  Surgeon: [name], MD;  Location: [facility];  Service: General;  Laterality: N/A;  . SHOULDER ARTHROSCOPY WITH SUBACROMIAL DECOMPRESSION Right 03/28/2013   Procedure: RIGHT SHOULDER ARTHROSCOPY  WITH SUBACROMIAL DECOMPRESSION AND DISTAL CLAVICLE RESECTION;  Surgeon: Marin Shutter, MD;  Location: [facility];  Service: Orthopedics;  Laterality: Right;  . SHOULDER SURGERY     left shoulder rotator cuff repair  . UPPER GI ENDOSCOPY    . XI ROBOTIC ASSISTED LOWER ANTERIOR RESECTION N/A 01/16/2019   Procedure: XI ROBOTIC RESECTION OF SIGMOID COLON;  Surgeon: [name], MD;  Location: [facility];  Service: General;  Laterality: N/A;       Family History  Problem Relation Age of Onset  . Colon cancer Mother   . Heart attack Father        , massive  . Coronary artery disease Brother   . Thyroid cancer Brother     Social History   Tobacco Use  . Smoking status: Former Smoker    Years: 35.00    Types: Cigarettes  . Smokeless tobacco: Never Used  Substance Use Topics  . Alcohol use: No  . Drug use: No    Home Medications Prior to Admission medications   Medication Sig Start Date End Date Taking? Authorizing Provider  aspirin EC 81 MG tablet Take 81 mg by mouth at bedtime.   Yes [provider]  atorvastatin (LIPITOR) 80 MG tablet TAKE 1 TABLET BY MOUTH AT  BEDTIME Patient taking differently: Take 80 mg by mouth daily at 6 PM.  12/13/18  Yes Skains, Thana Farr, MD  clopidogrel (PLAVIX) 75 MG tablet TAKE 1 TABLET BY MOUTH AT  BEDTIME Patient taking differently: Take 75 mg by mouth daily.  12/13/18  Yes [name], MD  fenofibrate 160 MG tablet Take 160 mg by mouth at bedtime.   Yes [provider]  magnesium oxide (MAG-OX) 400 (241.3 Mg) MG tablet Take 1 tablet (400 mg total) by mouth daily. 01/01/19  Yes [name], MD  metoprolol succinate (TOPROL-XL) 25 MG 24 hr tablet TAKE 1 TABLET BY MOUTH  DAILY Patient taking differently: Take 25 mg by mouth daily.  12/13/18  Yes [name], MD  Multiple Vitamins-Minerals (MULTIVITAMIN ADULT PO) Take 1 tablet by mouth daily.    Yes [provider]  niacin (NIASPAN) 750 MG CR tablet Take 1,500 mg by mouth at  bedtime.   Yes [provider]  nitroGLYCERIN (NITROSTAT) 0.4 MG SL tablet Place 0.4 mg under the tongue every 5 (five) minutes as needed for chest pain.   Yes [provider]  ondansetron (ZOFRAN-ODT) 4 MG disintegrating tablet Take 4 mg by mouth every 8 (eight) hours as needed for nausea/vomiting. 12/23/18  Yes [provider]  pantoprazole (PROTONIX) 40 MG tablet Take 40 mg by mouth at bedtime.   Yes [provider]  polyethylene glycol (MIRALAX / GLYCOLAX) 17 g packet Take 17 g by mouth 2 (two) times daily. Patient taking differently: Take 17 g by mouth daily.  01/01/19  Yes [name], MD  traMADol Veatrice Bourbon) 50  MG tablet Take 1-2 tablets (50-100 mg total) by mouth every 6 (six) hours as needed for moderate pain or severe pain. 01/16/19  Yes [name], MD  hydrocortisone (ANUSOL-HC) 2.5 % rectal cream Apply 1 application topically 4 (four) times daily as needed for hemorrhoids. Patient not taking: Reported on 01/07/2019 01/01/19   [name], MD    Allergies    Ciprofloxacin and Daypro [oxaprozin]  Review of Systems   Review of Systems  Constitutional: Negative for chills and fever.  Respiratory: Positive for shortness of breath. Negative for cough.   Cardiovascular: Negative for chest pain and palpitations.  Gastrointestinal: Negative for abdominal pain and vomiting.  Neurological: Negative for syncope and light-headedness.  Psychiatric/Behavioral: Negative for agitation and confusion.  All other systems reviewed and are negative.   Physical Exam Updated Vital Signs BP 125/84   Pulse 98   Temp 97.9 F (36.6 C) (Oral)   Resp 14   Ht 5\' 8"  (1.727 m)   Wt 77.1 kg   SpO2 97%   BMI 25.85 kg/m   Physical Exam Vitals and nursing note reviewed.  Constitutional:      Appearance: He is well-developed.  HENT:     Head: Normocephalic and atraumatic.  Eyes:     Conjunctiva/sclera: Conjunctivae normal.  Cardiovascular:     Rate and  Rhythm: Normal rate and regular rhythm.     Pulses: Normal pulses.  Pulmonary:     Effort: Pulmonary effort is normal. No respiratory distress.     Breath sounds: Normal breath sounds.     Comments: 100% on room air Abdominal:     Palpations: Abdomen is soft.     Tenderness: There is no abdominal tenderness.  Musculoskeletal:     Cervical back: Neck supple.  Skin:    General: Skin is warm and dry.  Neurological:     Mental Status: He is alert.     ED Results / Procedures / Treatments   Labs (all labs ordered are listed, but only abnormal results are displayed) Labs Reviewed  COMPREHENSIVE METABOLIC PANEL - Abnormal; Notable for the following components:      Result Value   Glucose, Bld 125 (*)    All other components within normal limits  CBC - Abnormal; Notable for the following components:   RBC 3.92 (*)    Hemoglobin 12.0 (*)    HCT 38.4 (*)    Platelets 460 (*)    All other components within normal limits  LIPASE, BLOOD  URINALYSIS, ROUTINE W REFLEX MICROSCOPIC  BRAIN NATRIURETIC PEPTIDE  POC SARS CORONAVIRUS 2 AG -  ED  TROPONIN I (HIGH SENSITIVITY)    EKG None  Radiology DG Chest 2 View  Result Date: 01/31/2019 CLINICAL DATA:  Shortness of breath, recent discharge for colon resection 01/19/2019 EXAM: CHEST - 2 VIEW COMPARISON:  Radiograph 01/14/2019, CT 10/31/2010 FINDINGS: No consolidation, features of edema, pneumothorax, or effusion. Pulmonary vascularity is normally distributed. The cardiomediastinal contours are unremarkable. No acute osseous or soft tissue abnormality. Degenerative changes are present in the imaged spine and shoulders. IMPRESSION: No acute cardiopulmonary abnormality. Electronically Signed   By: Lovena Le M.D.   On: 01/31/2019 18:59   CT Angio Chest PE W and/or Wo Contrast  Result Date: 01/31/2019 CLINICAL DATA:  Patient had recent colon resection. Shortness of breath. EXAM: CT ANGIOGRAPHY CHEST WITH CONTRAST TECHNIQUE: Multidetector  CT imaging of the chest was performed using the standard protocol during bolus administration of intravenous contrast. Multiplanar CT image reconstructions  and MIPs were obtained to evaluate the vascular anatomy. CONTRAST:  14mL OMNIPAQUE IOHEXOL 350 MG/ML SOLN COMPARISON:  October 31, 2010 FINDINGS: Cardiovascular: Satisfactory opacification of the pulmonary arteries to the segmental level. No evidence of pulmonary embolism. Normal heart size. No pericardial effusion. Mediastinum/Nodes: No enlarged mediastinal, hilar, or axillary lymph nodes. Thyroid gland, trachea, and esophagus demonstrate no significant findings. Lungs/Pleura: Lungs are clear. No pleural effusion or pneumothorax. Upper Abdomen: No acute abnormality. Musculoskeletal: No chest wall abnormality. Degenerative joint changes of spine are noted. Review of the MIP images confirms the above findings. IMPRESSION: 1. No pulmonary embolus. 2. No acute abnormality identified in the chest. Electronically Signed   By: Abelardo Diesel M.D.   On: 01/31/2019 20:02    Procedures Procedures (including critical care time)  Medications Ordered in ED Medications  iohexol (OMNIPAQUE) 350 MG/ML injection 100 mL (100 mLs Intravenous Contrast Given 01/31/19 1952)    ED Course  I have reviewed the triage vital signs and the nursing notes.  Pertinent labs & imaging results that were available during my care of the patient were reviewed by me and considered in my medical decision making (see chart for details).  This is a  68 yo male presenting with dyspnea and SOB since his abdominal surgery on 01/19/2019.    My primary concern was PE, and a CT PE was obtained.  This did not show acute lung pathology, including PE or pneumonia.  The lungs are clear.  There are no findings on CT to suggest COVID-19.  His rapid COVID test is also negative.  His cardiac workup is benign.  BNP and troponins are negative, and ECG does not show acute ischemia.  He apparently  was found to be anemic in the hospital and started on iron pills, with his wife telling me his hemoglobin had been "about 7."  Today it is 12.0, which is a dramatic improvement.  I do not believe this is symptomatic anemia.  His workup is otherwise benign.  I explained I am not sure why he has this subjective sense of dyspnea, but his O2 numbers look good, and he is not in respiratory distress.  He appears to be taking deep periodic breaths and sighing on exam.  His wife asks if this could be a lingering effect of anesthesia, and I stated it was possible, although I expect any such effect would have washed out by now.  He also does not show signs of methoglobinemia, which can happen after some types of anesthesia.   Will discharge him now.  He has a follow up appointment in 1 week.   This note was dictated using dragon dictation software.  Please be aware that there may be minor translation errors as a result of this oral dictation Ray Brown was evaluated in Emergency Department on 02/01/2019 for the symptoms described in the history of present illness. He was evaluated in the context of the global COVID-19 pandemic, which necessitated consideration that the patient might be at risk for infection with the SARS-CoV-2 virus that causes COVID-19. Institutional protocols and algorithms that pertain to the evaluation of patients at risk for COVID-19 are in a state of rapid change based on information released by regulatory bodies including the CDC and federal and state organizations. These policies and algorithms were followed during the patient's care in the ED.  Clinical Course as of Feb 01 1207  Thu Jan 31, 2019  2126 Discussed the patient's work-up with him and his wife.  No evidence of anemia or pulmonary embolism or pneumonia.  COVID-19 test was negative, but as I explained it is possible that this was a false negative.  He has a follow-up appointment in 1 week.  Believe is reasonable time to  follow-up in.   [MT]    Clinical Course User Index [MT] Chanya Chrisley, Carola Rhine, MD    Final Clinical Impression(s) / ED Diagnoses Final diagnoses:  Shortness of breath    Rx / DC Orders ED Discharge Orders    None       Wyvonnia Dusky, MD 02/01/19 1208

## 2019-01-31 NOTE — Telephone Encounter (Signed)
Spoke with pt and wife - reviewed the discussion with Dr [name] and his recommendation to report to the ER at [facility].  Pt does report he was seen at his surgeons office 12/1 and his hemoglobin was 10 at that time.  He denies any leg pain or edema. No wt gain but continued SOB and some back pain.  Per wife - she will take him into the ER.

## 2019-01-31 NOTE — Telephone Encounter (Signed)
Left message to call back  

## 2019-01-31 NOTE — Telephone Encounter (Signed)
Spoke with wife, DPR on file.  Pt had colon resection on 11/28 and since d/c has been suffering with SOB.  Worse when he is exerting but also occurs at rest.  Denies swelling or weight gain.  Pt has actually dropped weight down to 178lbs due to being so sick with no appetite.  Pt also weak and fatigued.  Gets dizzy and lightheaded when up walking around.  Pt takes Metoprolol at night.  Advised I will send message to Dr. [name] and his nurse to review.

## 2019-02-05 ENCOUNTER — Other Ambulatory Visit (HOSPITAL_COMMUNITY): Payer: Self-pay | Admitting: Surgery

## 2019-02-05 ENCOUNTER — Other Ambulatory Visit: Payer: Self-pay | Admitting: Surgery

## 2019-02-05 DIAGNOSIS — R112 Nausea with vomiting, unspecified: Secondary | ICD-10-CM

## 2019-02-06 ENCOUNTER — Other Ambulatory Visit: Payer: Self-pay

## 2019-02-06 ENCOUNTER — Encounter (HOSPITAL_COMMUNITY): Payer: Self-pay

## 2019-02-06 ENCOUNTER — Ambulatory Visit (HOSPITAL_COMMUNITY)
Admission: RE | Admit: 2019-02-06 | Discharge: 2019-02-06 | Disposition: A | Discharge status: Home / Self Care | Payer: Medicare Other | Source: Ambulatory Visit | Attending: Surgery | Admitting: Surgery

## 2019-02-06 ENCOUNTER — Other Ambulatory Visit: Payer: Self-pay | Admitting: Surgery

## 2019-02-06 DIAGNOSIS — Z79899 Other long term (current) drug therapy: Secondary | ICD-10-CM | POA: Diagnosis not present

## 2019-02-06 DIAGNOSIS — Z7982 Long term (current) use of aspirin: Secondary | ICD-10-CM | POA: Diagnosis not present

## 2019-02-06 DIAGNOSIS — E1151 Type 2 diabetes mellitus with diabetic peripheral angiopathy without gangrene: Secondary | ICD-10-CM | POA: Diagnosis not present

## 2019-02-06 DIAGNOSIS — I2510 Atherosclerotic heart disease of native coronary artery without angina pectoris: Secondary | ICD-10-CM | POA: Insufficient documentation

## 2019-02-06 DIAGNOSIS — Z8719 Personal history of other diseases of the digestive system: Secondary | ICD-10-CM | POA: Diagnosis not present

## 2019-02-06 DIAGNOSIS — R112 Nausea with vomiting, unspecified: Secondary | ICD-10-CM | POA: Diagnosis not present

## 2019-02-06 DIAGNOSIS — Z87891 Personal history of nicotine dependence: Secondary | ICD-10-CM | POA: Insufficient documentation

## 2019-02-06 DIAGNOSIS — Z9889 Other specified postprocedural states: Secondary | ICD-10-CM | POA: Insufficient documentation

## 2019-02-06 DIAGNOSIS — K6389 Other specified diseases of intestine: Secondary | ICD-10-CM | POA: Insufficient documentation

## 2019-02-06 DIAGNOSIS — Z9049 Acquired absence of other specified parts of digestive tract: Secondary | ICD-10-CM | POA: Diagnosis not present

## 2019-02-06 MED ORDER — IOHEXOL 300 MG/ML  SOLN
100.0000 mL | Freq: Once | INTRAMUSCULAR | Status: AC | PRN
  Administered 2019-02-06: 14:00:00 100 mL via INTRAVENOUS

## 2019-02-06 MED ORDER — SODIUM CHLORIDE (PF) 0.9 % IJ SOLN
INTRAMUSCULAR | Status: AC
  Filled 2019-02-06: qty 50

## 2019-02-07 ENCOUNTER — Encounter: Payer: Self-pay | Admitting: Surgery

## 2019-02-07 ENCOUNTER — Ambulatory Visit (HOSPITAL_COMMUNITY)
Admission: RE | Admit: 2019-02-07 | Discharge: 2019-02-07 | Disposition: A | Discharge status: Home / Self Care | Payer: Medicare Other | Source: Ambulatory Visit | Attending: Surgery | Admitting: Surgery

## 2019-02-07 DIAGNOSIS — R112 Nausea with vomiting, unspecified: Secondary | ICD-10-CM | POA: Diagnosis not present

## 2019-02-07 MED ORDER — MAGNESIUM SULFATE 4 GM/100ML IV SOLN
4.0000 g | Freq: Once | INTRAVENOUS | Status: AC
  Administered 2019-02-07: 4 g via INTRAVENOUS
  Filled 2019-02-07: qty 100

## 2019-07-08 ENCOUNTER — Ambulatory Visit
Admission: RE | Admit: 2019-07-08 | Discharge: 2019-07-08 | Disposition: A | Discharge status: Home / Self Care | Payer: Medicare Other | Source: Ambulatory Visit | Attending: Family Medicine | Admitting: Family Medicine

## 2019-07-08 ENCOUNTER — Other Ambulatory Visit: Payer: Self-pay | Admitting: Family Medicine

## 2019-07-08 DIAGNOSIS — R0789 Other chest pain: Secondary | ICD-10-CM

## 2019-07-15 ENCOUNTER — Ambulatory Visit
Admission: RE | Admit: 2019-07-15 | Discharge: 2019-07-15 | Disposition: A | Discharge status: Home / Self Care | Payer: Medicare Other | Source: Ambulatory Visit | Attending: Physician Assistant | Admitting: Physician Assistant

## 2019-07-15 ENCOUNTER — Other Ambulatory Visit: Payer: Self-pay | Admitting: Physician Assistant

## 2019-07-15 DIAGNOSIS — R52 Pain, unspecified: Secondary | ICD-10-CM

## 2019-11-05 ENCOUNTER — Other Ambulatory Visit: Payer: Self-pay | Admitting: Cardiology

## 2019-12-05 IMAGING — CT CT ABD-PELV W/ CM
2 of 5 series · 16 of 46 positions shown, 18 images · IV contrast (omnipaque)
Comparison: November 03, 2018

CLINICAL DATA: Diverticulitis

EXAM:
CT ABDOMEN AND PELVIS WITH CONTRAST
TECHNIQUE: Multidetector CT imaging of the abdomen and pelvis was performed
using the standard protocol following bolus administration of
intravenous contrast.
CONTRAST:  100mL OMNIPAQUE IOHEXOL 300 MG/ML  SOLN

[Series 2: axial st · axial · 0.87mm/px · z∈[-505,-90]mm · 13 of 97 slices shown, 15 images]
[im 7/97  soft-tissue]
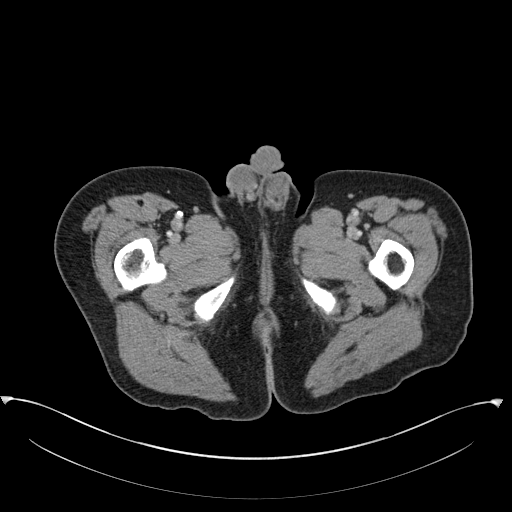
[im 7/97  bone]
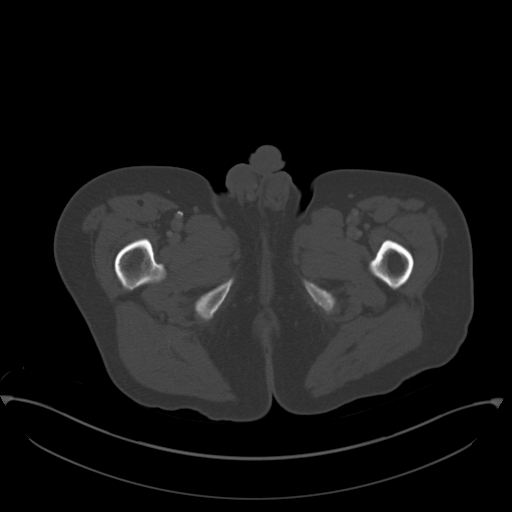
[im 13/97  soft-tissue]
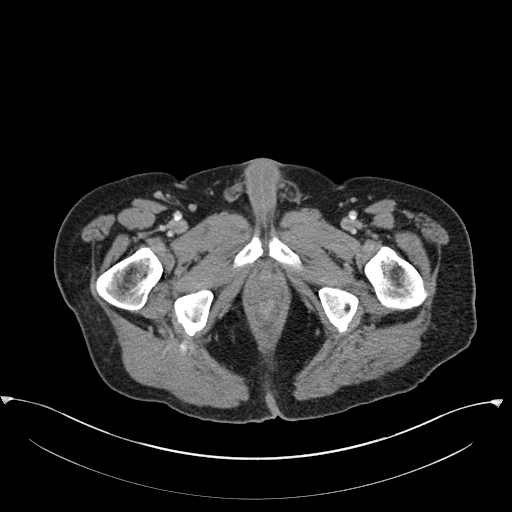
[im 20/97  soft-tissue]
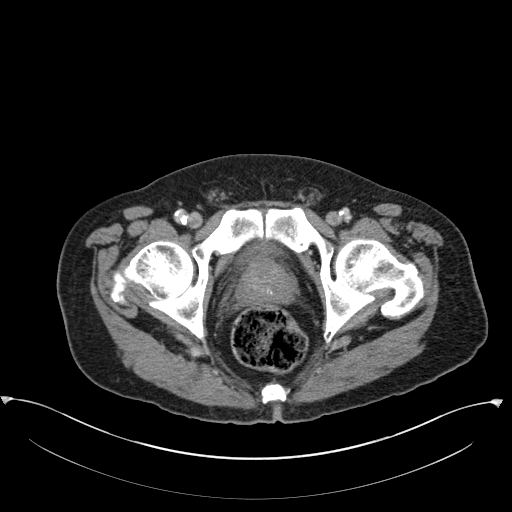
[im 26/97  soft-tissue]
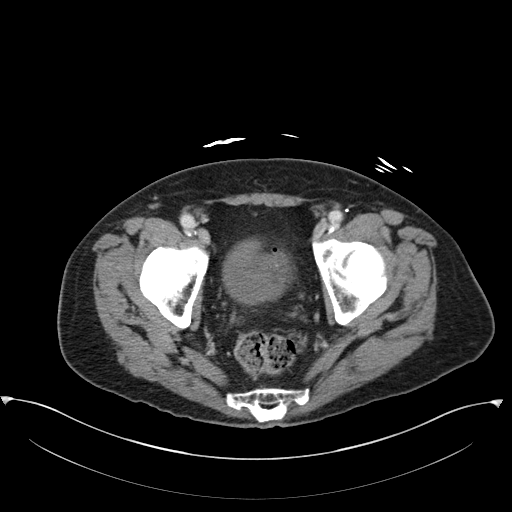
[im 33/97  soft-tissue]
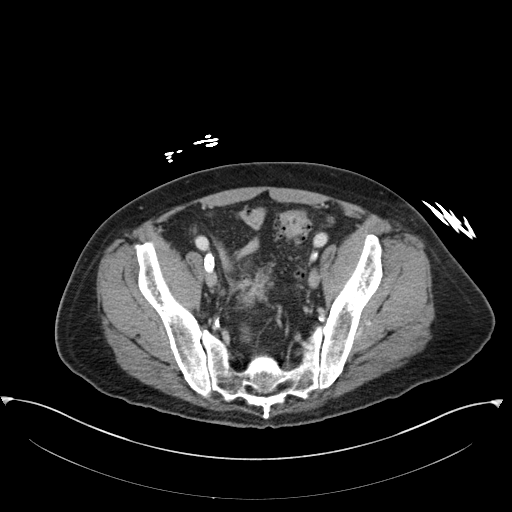
[im 39/97  soft-tissue]
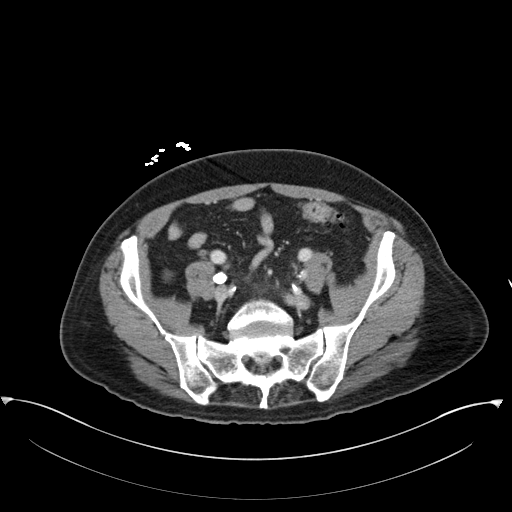
[im 52/97  soft-tissue]
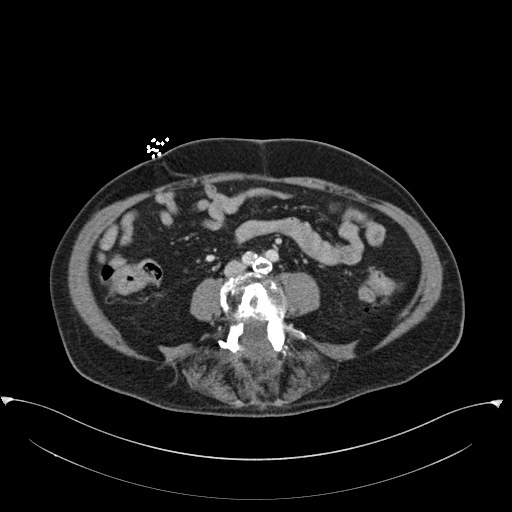
[im 58/97  soft-tissue]
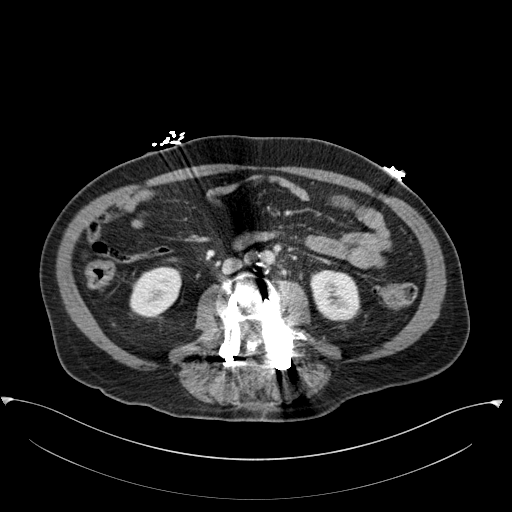
[im 65/97  soft-tissue]
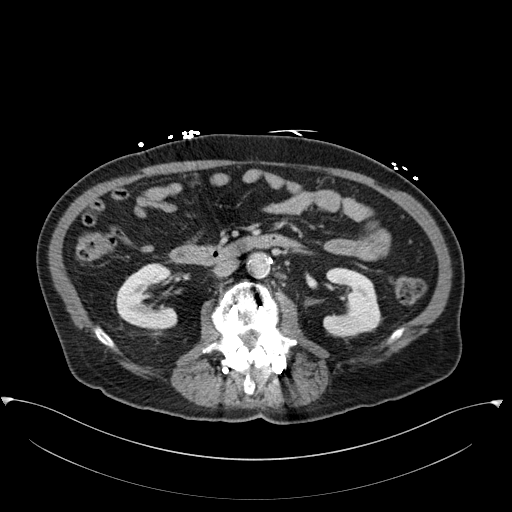
[im 65/97  bone]
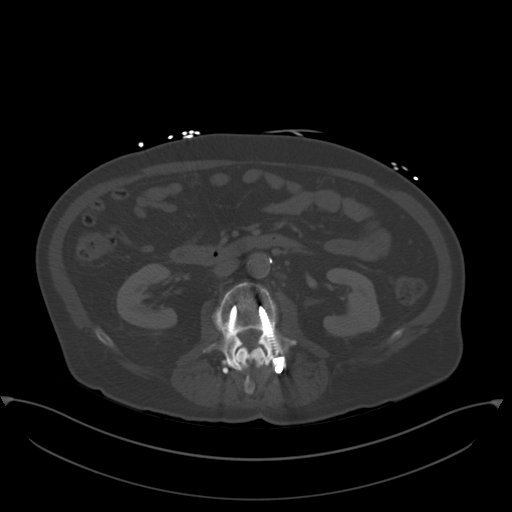
[im 71/97  soft-tissue]
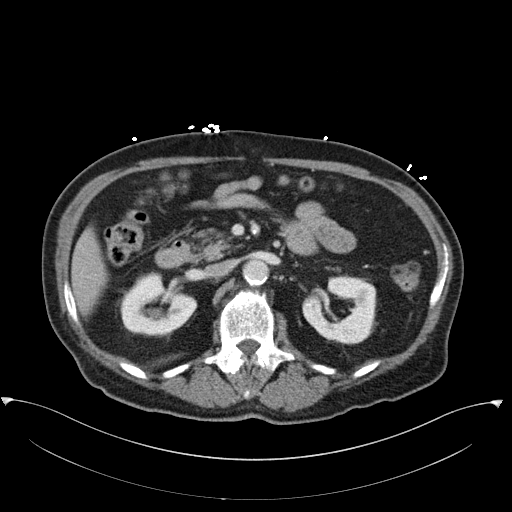
[im 77/97  soft-tissue]
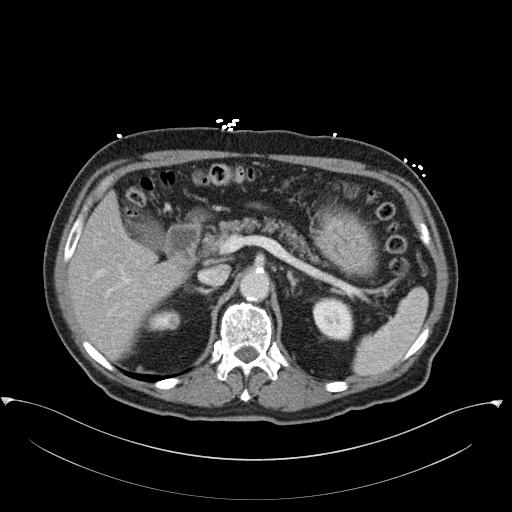
[im 84/97  soft-tissue]
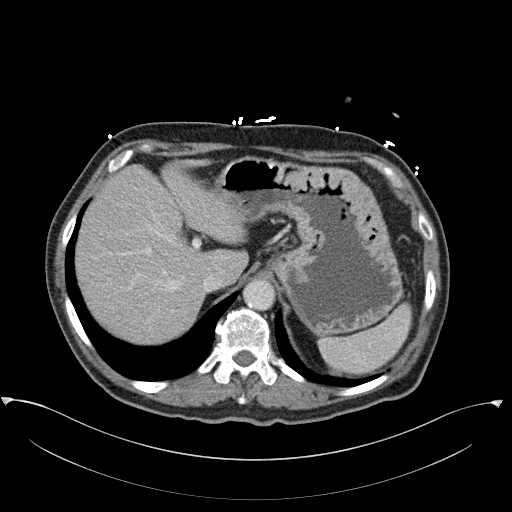
[im 90/97  soft-tissue]
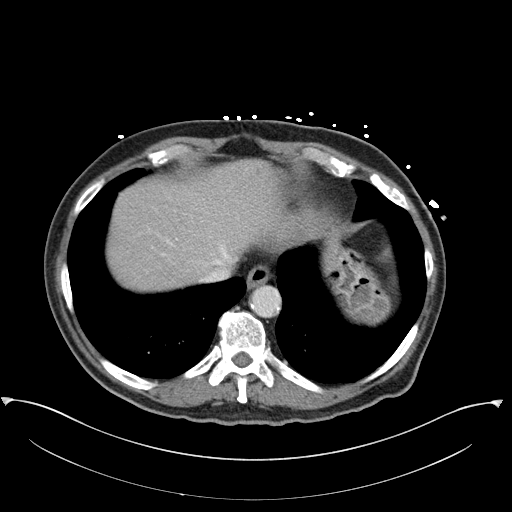

[Series 5: coronal st · coronal · 0.78mm/px · 3 of 137 slices shown]
[im 46/137  soft-tissue]
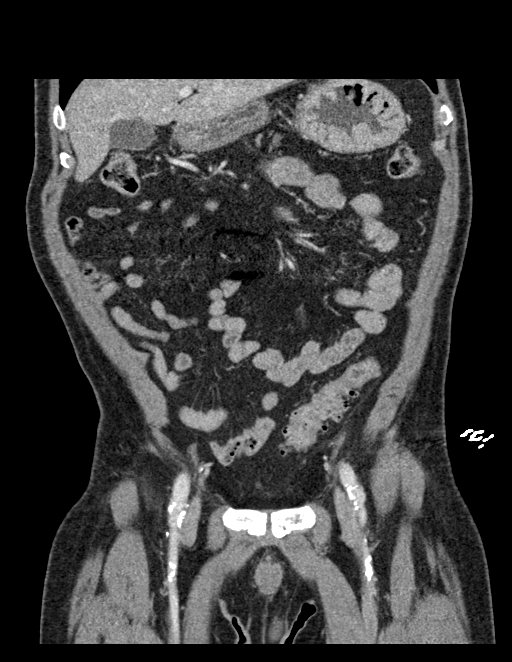
[im 61/137  soft-tissue]
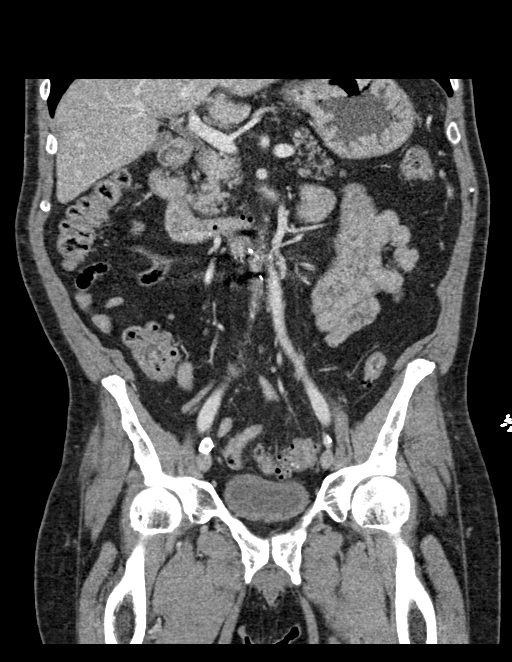
[im 76/137  soft-tissue]
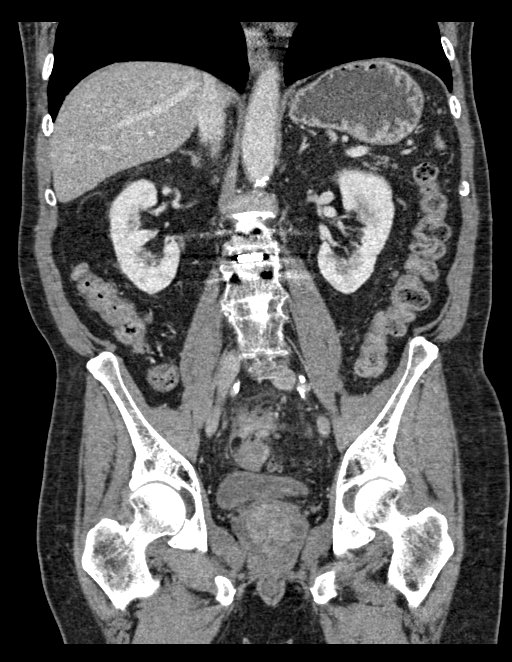

[16 of 46 positions shown; findings below may reference images not displayed]

FINDINGS: Lower chest: The visualized heart size within normal limits. No
pericardial fluid/thickening.

No hiatal hernia.

The visualized portions of the lungs are clear.

Hepatobiliary: The liver is normal in density without focal
abnormality.The main portal vein is patent. No evidence of calcified
gallstones, gallbladder wall thickening or biliary dilatation.

Pancreas: Unremarkable. No pancreatic ductal dilatation or
surrounding inflammatory changes.

Spleen: Normal in size without focal abnormality.

Adrenals/Urinary Tract: Both adrenal glands appear normal. Tiny
low-density lesions are seen within both kidneys as on the prior
exam.

Stomach/Bowel: The stomach and small bowel are normal in appearance.
Again noted is a focal segment of distal sigmoid colon
diverticulitis with diffuse bowel wall thickening. Tiny foci of free
air are again noted superior to area at with non loculated fluid.
There likely increased appearance of the non loculated collection
since the prior exam the appendix is unremarkable.

Vascular/Lymphatic: There are no enlarged mesenteric,
retroperitoneal, or pelvic lymph nodes. Scattered aortic
atherosclerotic calcifications are seen without aneurysmal
dilatation. Again noted is a aorto bi femoral stent graft.

Reproductive: The prostate is heterogeneous in appearance.

Other: No evidence of abdominal wall mass or hernia.

Musculoskeletal: No acute or significant osseous findings. Prior
posterior lumbar spine fixation is seen.
IMPRESSION: Again noted is distal sigmoid diverticulitis with adjacent micro
perforation non loculated fluid which could represent phlegmon. The
non-loculated collection appears slightly more prominent than the
prior exam.

## 2020-01-09 ENCOUNTER — Other Ambulatory Visit: Payer: Self-pay

## 2020-01-09 ENCOUNTER — Encounter: Payer: Self-pay | Admitting: Cardiology

## 2020-01-09 ENCOUNTER — Ambulatory Visit: Payer: Medicare Other | Admitting: Cardiology

## 2020-01-09 VITALS — BP 124/70 | HR 78 | Ht 68.0 in | Wt 199.0 lb

## 2020-01-09 DIAGNOSIS — I2510 Atherosclerotic heart disease of native coronary artery without angina pectoris: Secondary | ICD-10-CM | POA: Diagnosis not present

## 2020-01-09 DIAGNOSIS — E1151 Type 2 diabetes mellitus with diabetic peripheral angiopathy without gangrene: Secondary | ICD-10-CM | POA: Diagnosis not present

## 2020-01-09 DIAGNOSIS — I739 Peripheral vascular disease, unspecified: Secondary | ICD-10-CM | POA: Diagnosis not present

## 2020-01-09 DIAGNOSIS — E782 Mixed hyperlipidemia: Secondary | ICD-10-CM | POA: Diagnosis not present

## 2020-01-09 NOTE — Progress Notes (Signed)
Cardiology Office Note:    Date:  01/09/2020   ID:  Ray Brown, DOB 1950/06/16, MRN [id number]  PCP:  [name], MD  [facility] Cardiologist:  Candee Furbish, MD  [facility] Electrophysiologist:  None   Referring MD: [name], MD     History of Present Illness:    Ray Brown is a 69 y.o. male here for the follow-up of CAD, PVD.  Cardiac catheterization July 2010: Bare-metal stent to the RCA, August 8 had in-stent restenosis.  Aortobifemoral bypass in 2003, Dr. Donnetta Hutching.  2012 and 2016 heart catheterization repeats showed patent RCA stents.  Nuclear stress test had shown some inferior wall ischemia prior to repeat cath.  Right iliac stent was placed as well as bifemoral bypass.  At previous visit we were discussing his colonic perforation in 2020. Had vertigo, especially in MRI. Still has an issue with it. Saw ENT.  Still battles with a little bit of vertigo as well, when leaning over for instance bending over he may fall over to the side.  Strange sensation.  He did some physical therapy in the hospital previously.  Suggested possible vestibular physical therapy.     Past Medical History:  Diagnosis Date  . Arthritis   . Bronchitis   . Chronic back pain   . Complication of anesthesia    difficulty breathing s/p back surgery at [facility] 2013  . Coronary artery disease   . Diabetes mellitus without complication ([place])   . Dry skin    in the back of head-left side  . GERD (gastroesophageal reflux disease)   . H/O hiatal hernia   . Hypercholesteremia   . Peripheral vascular disease ([place])   . Pneumonia   . Shortness of breath   . Sleep apnea    has CPAP but does not use  . Unstable angina ([place]) 12/05/2014    Past Surgical History:  Procedure Laterality Date  . AORTA - BILATERAL FEMORAL ARTERY BYPASS GRAFT  11/27/2001   Dr Sherren Mocha Early.  Aortobifemoral bypass with a 14 x 8 Hemashield graft.  Marland Kitchen BACK SURGERY  2004  . CARDIAC CATHETERIZATION  2010   x  2 stents RCA  . CARDIAC CATHETERIZATION N/A 12/08/2014   Procedure: Left Heart Cath and Coronary Angiography;  Surgeon: Lorretta Harp, MD;  Location: [place] [facility];  Service: Cardiovascular;  Laterality: N/A;  . COLONOSCOPY  08/04/2009  . CORONARY ANGIOPLASTY     2 stents  . FOOT SURGERY  1980s   right foot  . LYSIS OF ADHESION N/A 01/16/2019   Procedure: LYSIS OF ADHESIONS;  Surgeon: [name], MD;  Location: [facility];  Service: General;  Laterality: N/A;  . PROCTOSCOPY N/A 01/16/2019   Procedure: RIGID PROCTOSCOPY;  Surgeon: [name], MD;  Location: [facility];  Service: General;  Laterality: N/A;  . SHOULDER ARTHROSCOPY WITH SUBACROMIAL DECOMPRESSION Right 03/28/2013   Procedure: RIGHT SHOULDER ARTHROSCOPY WITH SUBACROMIAL DECOMPRESSION AND DISTAL CLAVICLE RESECTION;  Surgeon: Marin Shutter, MD;  Location: [place];  Service: Orthopedics;  Laterality: Right;  . SHOULDER SURGERY     left shoulder rotator cuff repair  . UPPER GI ENDOSCOPY    . XI ROBOTIC ASSISTED LOWER ANTERIOR RESECTION N/A 01/16/2019   Procedure: XI ROBOTIC RESECTION OF SIGMOID COLON;  Surgeon: [name], MD;  Location: [facility];  Service: General;  Laterality: N/A;    Current Medications: Current Meds  Medication Sig  . aspirin EC 81 MG tablet Take 81 mg by mouth  at bedtime.  Marland Kitchen atorvastatin (LIPITOR) 80 MG tablet Take 1 tablet (80 mg total) by mouth daily. Appointment needed for further refills. Please call [phone] 1st attempt  . clopidogrel (PLAVIX) 75 MG tablet Take 1 tablet (75 mg total) by mouth at bedtime. Appointment needed for further refills. Please call [phone] 1st attempt  . fenofibrate 160 MG tablet Take 160 mg by mouth at bedtime.  Marland Kitchen glipiZIDE (GLUCOTROL XL) 5 MG 24 hr tablet Take 5 mg by mouth daily.  . hydrocortisone (ANUSOL-HC) 2.5 % rectal cream Apply 1 application topically 4 (four) times daily as needed for hemorrhoids.  . magnesium oxide (MAG-OX) 400 (241.3 Mg) MG tablet Take 1  tablet (400 mg total) by mouth daily.  . metoprolol succinate (TOPROL-XL) 25 MG 24 hr tablet Take 1 tablet (25 mg total) by mouth daily. Appointment needed for further refills. Please call [phone] 1st attempt  . Multiple Vitamins-Minerals (MULTIVITAMIN ADULT PO) Take 1 tablet by mouth daily.   . nitroGLYCERIN (NITROSTAT) 0.4 MG SL tablet Place 0.4 mg under the tongue every 5 (five) minutes as needed for chest pain.  Marland Kitchen ondansetron (ZOFRAN-ODT) 4 MG disintegrating tablet Take 4 mg by mouth every 8 (eight) hours as needed for nausea/vomiting.  . pantoprazole (PROTONIX) 40 MG tablet Take 40 mg by mouth at bedtime.  . polyethylene glycol (MIRALAX / GLYCOLAX) 17 g packet Take 17 g by mouth 2 (two) times daily.  . traMADol (ULTRAM) 50 MG tablet Take 1-2 tablets (50-100 mg total) by mouth every 6 (six) hours as needed for moderate pain or severe pain.  . [DISCONTINUED] niacin (NIASPAN) 750 MG CR tablet Take 1,500 mg by mouth at bedtime.     Allergies:   Augmentin [amoxicillin-pot clavulanate], Ciprofloxacin, and Daypro [oxaprozin]   Social History   Socioeconomic History  . Marital status: Married    Spouse name: Not on file  . Number of children: Not on file  . Years of education: Not on file  . Highest education level: Not on file  Occupational History  . Occupation: retired  Tobacco Use  . Smoking status: Former Smoker    Years: 35.00    Types: Cigarettes  . Smokeless tobacco: Never Used  Vaping Use  . Vaping Use: Never used  Substance and Sexual Activity  . Alcohol use: No  . Drug use: No  . Sexual activity: Not on file  Other Topics Concern  . Not on file  Social History Narrative  . Not on file   Social Determinants of Health   Financial Resource Strain:   . Difficulty of Paying Living Expenses: Not on file  Food Insecurity:   . Worried About Charity fundraiser in the Last Year: Not on file  . Ran Out of Food in the Last Year: Not on file  Transportation Needs:   .  Lack of Transportation (Medical): Not on file  . Lack of Transportation (Non-Medical): Not on file  Physical Activity:   . Days of Exercise per Week: Not on file  . Minutes of Exercise per Session: Not on file  Stress:   . Feeling of Stress : Not on file  Social Connections:   . Frequency of Communication with Friends and Family: Not on file  . Frequency of Social Gatherings with Friends and Family: Not on file  . Attends Religious Services: Not on file  . Active Member of Clubs or Organizations: Not on file  . Attends Archivist Meetings: Not on file  .  Marital Status: Not on file     Family History: The patient's family history includes Colon cancer in his mother; Coronary artery disease in his brother; Heart attack in his father; Thyroid cancer in his brother.  ROS:   Please see the history of present illness.    No fevers chills nausea vomiting syncope bleeding all other systems reviewed and are negative.  EKGs/Labs/Other Studies Reviewed:    The following studies were reviewed today: ECHO 09/28/18:  1. The left ventricle has normal systolic function with an ejection  fraction of 60-65%. The cavity size was normal. There is mildly increased  left ventricular wall thickness. Left ventricular diastolic Doppler  parameters are consistent with impaired  relaxation.  2. The right ventricle has normal systolic function. The cavity was  normal. There is no increase in right ventricular wall thickness. Right  ventricular systolic pressure could not be assessed.  3. The aortic valve is tricuspid. Moderate sclerosis of the aortic valve.  No stenosis of the aortic valve.   EKG:  EKG is  ordered today.  The ekg ordered today demonstrates sinus rhythm 78 with no other abnormalities.  Recent Labs: 01/17/2019: Magnesium 1.1 01/31/2019: ALT 21; B Natriuretic Peptide 15.1; BUN 15; Creatinine, Ser 0.92; Hemoglobin 12.0; Platelets 460; Potassium 3.8; Sodium 140  Recent Lipid  Panel    Component Value Date/Time   CHOL 113 12/21/2012 0932   TRIG 141 11/06/2018 0541   TRIG 126 12/21/2012 0932   HDL 35 (L) 12/21/2012 0932   CHOLHDL 5.3 01/23/2010 0400   VLDL 38 01/23/2010 0400   LDLCALC 53 12/21/2012 0932      Physical Exam:    VS:  BP 124/70   Pulse 78   Ht 5\' 8"  (1.727 m)   Wt 199 lb (90.3 kg)   SpO2 96%   BMI 30.26 kg/m     Wt Readings from Last 3 Encounters:  01/09/20 199 lb (90.3 kg)  02/07/19 170 lb (77.1 kg)  01/31/19 170 lb (77.1 kg)     GEN:  Well nourished, well developed in no acute distress HEENT: Normal NECK: No JVD; No carotid bruits LYMPHATICS: No lymphadenopathy CARDIAC: RRR, no murmurs, rubs, gallops RESPIRATORY:  Clear to auscultation without rales, wheezing or rhonchi  ABDOMEN: Soft, non-tender, non-distended MUSCULOSKELETAL:  No edema; No deformity  SKIN: Warm and dry NEUROLOGIC:  Alert and oriented x 3 PSYCHIATRIC:  Normal affect   ASSESSMENT:    1. Coronary artery disease involving native coronary artery of native heart without angina pectoris   2. Peripheral vascular disease, unspecified ([place])   3. Mixed hyperlipidemia   4. Diabetes mellitus with peripheral artery disease (HCC)    PLAN:    In order of problems listed above:  Coronary artery disease -RCA BMS stent 2010.  2 subsequent cardiac catheterization showing patent stent.  Doing well.  Aggressive risk factor modification. -Continue with aspirin 81, atorvastatin 80, Plavix 75, Toprol-XL 25.  Hyperlipidemia -On atorvastatin 80 , stopped $100 Niaspan 1500 mg at bedtime. Now on fish oil Triglycerides 94 LDL 57 HDL 32 from outside labs.  Creatinine 0.9 and ALT liver function 14.  Excellent.  Peripheral vascular disease -Right iliac stent as well as prior aortobifemoral bypass. -Continuing with Plavix. -1 could consider Xarelto 2.5 mg twice a day instead. Cost however an issue.   Diabetes with hyperlipidemia PVD -Last hemoglobin A1c from outside 6.3.   Excellent.  Continue with current diet control.  Persistent vertigo -Consider vestibular PT.  Suggested YouTube video  for maneuvers.   Shared Decision Making/Informed Consent        Medication Adjustments/Labs and Tests Ordered: Current medicines are reviewed at length with the patient today.  Concerns regarding medicines are outlined above.  Orders Placed This Encounter  Procedures  . EKG 12-Lead   No orders of the defined types were placed in this encounter.   Patient Instructions  Medication Instructions:  The current medical regimen is effective;  continue present plan and medications.  *If you need a refill on your cardiac medications before your next appointment, please call your pharmacy*  Follow-Up: At [facility], you and your health needs are our priority.  As part of our continuing mission to provide you with exceptional heart care, we have created designated Provider Care Teams.  These Care Teams include your primary Cardiologist (physician) and Advanced Practice Providers (APPs -  Physician Assistants and Nurse Practitioners) who all work together to provide you with the care you need, when you need it.  We recommend signing up for the patient portal called "MyChart".  Sign up information is provided on this After Visit Summary.  MyChart is used to connect with patients for Virtual Visits (Telemedicine).  Patients are able to view lab/test results, encounter notes, upcoming appointments, etc.  Non-urgent messages can be sent to your provider as well.   To learn more about what you can do with MyChart, go to [email or web address].    Your next appointment:   12 month(s)  The format for your next appointment:   In Person  Provider:   Candee Furbish, MD  Thank you for choosing [facility]!!         Signed, Candee Furbish, MD  01/09/2020 9:37 AM    [place]

## 2020-01-09 NOTE — Patient Instructions (Signed)
Medication Instructions:  The current medical regimen is effective;  continue present plan and medications.  *If you need a refill on your cardiac medications before your next appointment, please call your pharmacy*  Follow-Up: At [facility], you and your health needs are our priority.  As part of our continuing mission to provide you with exceptional heart care, we have created designated Provider Care Teams.  These Care Teams include your primary Cardiologist (physician) and Advanced Practice Providers (APPs -  Physician Assistants and Nurse Practitioners) who all work together to provide you with the care you need, when you need it.  We recommend signing up for the patient portal called "MyChart".  Sign up information is provided on this After Visit Summary.  MyChart is used to connect with patients for Virtual Visits (Telemedicine).  Patients are able to view lab/test results, encounter notes, upcoming appointments, etc.  Non-urgent messages can be sent to your provider as well.   To learn more about what you can do with MyChart, go to [email or web address].    Your next appointment:   12 month(s)  The format for your next appointment:   In Person  Provider:   Mark Skains, MD   Thank you for choosing [facility]!!      

## 2020-02-28 DIAGNOSIS — R509 Fever, unspecified: Secondary | ICD-10-CM | POA: Diagnosis not present

## 2020-02-28 DIAGNOSIS — Z03818 Encounter for observation for suspected exposure to other biological agents ruled out: Secondary | ICD-10-CM | POA: Diagnosis not present

## 2020-02-28 DIAGNOSIS — R059 Cough, unspecified: Secondary | ICD-10-CM | POA: Diagnosis not present

## 2020-03-25 ENCOUNTER — Other Ambulatory Visit: Payer: Self-pay | Admitting: Cardiology

## 2020-04-20 DIAGNOSIS — Z961 Presence of intraocular lens: Secondary | ICD-10-CM | POA: Diagnosis not present

## 2020-04-20 DIAGNOSIS — E119 Type 2 diabetes mellitus without complications: Secondary | ICD-10-CM | POA: Diagnosis not present

## 2020-06-11 ENCOUNTER — Ambulatory Visit
Admission: RE | Admit: 2020-06-11 | Discharge: 2020-06-11 | Disposition: A | Discharge status: Home / Self Care | Payer: Medicare Other | Source: Ambulatory Visit | Attending: Family Medicine | Admitting: Family Medicine

## 2020-06-11 ENCOUNTER — Other Ambulatory Visit: Payer: Self-pay | Admitting: Family Medicine

## 2020-06-11 ENCOUNTER — Encounter (INDEPENDENT_AMBULATORY_CARE_PROVIDER_SITE_OTHER): Payer: Self-pay

## 2020-06-11 DIAGNOSIS — K5900 Constipation, unspecified: Secondary | ICD-10-CM | POA: Diagnosis not present

## 2020-06-11 DIAGNOSIS — R194 Change in bowel habit: Secondary | ICD-10-CM

## 2020-06-16 DIAGNOSIS — I739 Peripheral vascular disease, unspecified: Secondary | ICD-10-CM | POA: Diagnosis not present

## 2020-06-16 DIAGNOSIS — I25119 Atherosclerotic heart disease of native coronary artery with unspecified angina pectoris: Secondary | ICD-10-CM | POA: Diagnosis not present

## 2020-06-16 DIAGNOSIS — L409 Psoriasis, unspecified: Secondary | ICD-10-CM | POA: Diagnosis not present

## 2020-06-16 DIAGNOSIS — G4733 Obstructive sleep apnea (adult) (pediatric): Secondary | ICD-10-CM | POA: Diagnosis not present

## 2020-06-16 DIAGNOSIS — K2270 Barrett's esophagus without dysplasia: Secondary | ICD-10-CM | POA: Diagnosis not present

## 2020-06-16 DIAGNOSIS — I700 Atherosclerosis of aorta: Secondary | ICD-10-CM | POA: Diagnosis not present

## 2020-06-16 DIAGNOSIS — E782 Mixed hyperlipidemia: Secondary | ICD-10-CM | POA: Diagnosis not present

## 2020-06-16 DIAGNOSIS — E1151 Type 2 diabetes mellitus with diabetic peripheral angiopathy without gangrene: Secondary | ICD-10-CM | POA: Diagnosis not present

## 2020-06-16 DIAGNOSIS — I119 Hypertensive heart disease without heart failure: Secondary | ICD-10-CM | POA: Diagnosis not present

## 2020-06-16 DIAGNOSIS — M1219 Kaschin-Beck disease, multiple sites: Secondary | ICD-10-CM | POA: Diagnosis not present

## 2020-06-16 DIAGNOSIS — Z0000 Encounter for general adult medical examination without abnormal findings: Secondary | ICD-10-CM | POA: Diagnosis not present

## 2020-08-03 ENCOUNTER — Other Ambulatory Visit: Payer: Self-pay

## 2020-08-03 MED ORDER — CLOPIDOGREL BISULFATE 75 MG PO TABS: 75.0000 mg | ORAL_TABLET | Freq: Every day | ORAL | 1 refills | Status: DC

## 2020-08-29 DIAGNOSIS — Z23 Encounter for immunization: Secondary | ICD-10-CM | POA: Diagnosis not present

## 2020-12-17 DIAGNOSIS — I119 Hypertensive heart disease without heart failure: Secondary | ICD-10-CM | POA: Diagnosis not present

## 2020-12-17 DIAGNOSIS — E782 Mixed hyperlipidemia: Secondary | ICD-10-CM | POA: Diagnosis not present

## 2020-12-17 DIAGNOSIS — E1151 Type 2 diabetes mellitus with diabetic peripheral angiopathy without gangrene: Secondary | ICD-10-CM | POA: Diagnosis not present

## 2020-12-17 DIAGNOSIS — I25119 Atherosclerotic heart disease of native coronary artery with unspecified angina pectoris: Secondary | ICD-10-CM | POA: Diagnosis not present

## 2020-12-17 DIAGNOSIS — R42 Dizziness and giddiness: Secondary | ICD-10-CM | POA: Diagnosis not present

## 2020-12-28 DIAGNOSIS — H8113 Benign paroxysmal vertigo, bilateral: Secondary | ICD-10-CM | POA: Diagnosis not present

## 2021-01-07 ENCOUNTER — Other Ambulatory Visit: Payer: Self-pay | Admitting: Cardiology

## 2021-02-09 ENCOUNTER — Other Ambulatory Visit: Payer: Self-pay | Admitting: Cardiology

## 2021-02-09 NOTE — Progress Notes (Signed)
Cardiology Office Note:    Date:  02/10/2021   ID:  Ray Brown, DOB [birth year], MRN [id number]  PCP:  [name], MD  [facility] Cardiologist:  Candee Furbish, MD  [facility] Electrophysiologist:  None   Referring MD: [name], MD   History of Present Illness:    Ray Brown is a 70 y.o. male here for the follow-up of coronary artery disease and hyperlipidemia.   12/17/20 Labs at [place] personally reviewed: Hgb A1c - 6.4, Creatinine - 1.02, Potassium - 4.6 Cholesterol - 109 Chol/HDL - 3.9 HDLD - 28 L Triglycerides - 188 NHDL - 81 LDL - 50  Cardiac catheterization July 2010: Bare-metal stent to the RCA  Aortobifemoral bypass in 2003, Dr. Donnetta Hutching.  2012 and 2016 heart catheterization repeats showed patent RCA stents.  Nuclear stress test had shown some inferior wall ischemia prior to repeat cath.  Right iliac stent was placed as well as bifemoral bypass.  At previous visit we were discussing his colonic perforation in 2020. Had vertigo, especially in MRI. Still has an issue with it. Saw ENT.  Still battles with a little bit of vertigo as well, when leaning over for instance bending over he may fall over to the side.  Strange sensation.  He did some physical therapy in the hospital previously.  Suggested possible vestibular physical therapy.  Today: Overall he is feeling pretty good, with no significant cardiovascular symptoms lately.  We reviewed his recent labs from [place] which are excellent.  He is celebrating 72 years of marriage next week.  He denies any palpitations, chest pain, or shortness of breath. No lightheadedness, headaches, syncope, orthopnea, PND, lower extremity edema or exertional symptoms.   Past Medical History:  Diagnosis Date   Arthritis    Bronchitis    Chronic back pain    Complication of anesthesia    difficulty breathing s/p back surgery at [facility] 2013   Coronary artery disease    Diabetes mellitus without complication  (HCC)    Dry skin    in the back of head-left side   GERD (gastroesophageal reflux disease)    H/O hiatal hernia    Hypercholesteremia    Peripheral vascular disease (HCC)    Pneumonia    Shortness of breath    Sleep apnea    has CPAP but does not use   Unstable angina ([place]) 12/05/2014    Past Surgical History:  Procedure Laterality Date   AORTA - BILATERAL FEMORAL ARTERY BYPASS GRAFT  11/27/2001   Dr Sherren Mocha Early.  Aortobifemoral bypass with a 14 x 8 Hemashield graft.   BACK SURGERY  2004   CARDIAC CATHETERIZATION  2010   x 2 stents RCA   CARDIAC CATHETERIZATION N/A 12/08/2014   Procedure: Left Heart Cath and Coronary Angiography;  Surgeon: Lorretta Harp, MD;  Location: [facility];  Service: Cardiovascular;  Laterality: N/A;   COLONOSCOPY  08/04/2009   CORONARY ANGIOPLASTY     2 stents   FOOT SURGERY  1980s   right foot   LYSIS OF ADHESION N/A 01/16/2019   Procedure: LYSIS OF ADHESIONS;  Surgeon: [name], MD;  Location: [facility];  Service: General;  Laterality: N/A;   PROCTOSCOPY N/A 01/16/2019   Procedure: RIGID PROCTOSCOPY;  Surgeon: [name], MD;  Location: [facility];  Service: General;  Laterality: N/A;   SHOULDER ARTHROSCOPY WITH SUBACROMIAL DECOMPRESSION Right 03/28/2013   Procedure: RIGHT SHOULDER ARTHROSCOPY WITH SUBACROMIAL DECOMPRESSION AND DISTAL CLAVICLE RESECTION;  Surgeon: Lennette Bihari  Sundra Aland, MD;  Location: [place];  Service: Orthopedics;  Laterality: Right;   SHOULDER SURGERY     left shoulder rotator cuff repair   UPPER GI ENDOSCOPY     XI ROBOTIC ASSISTED LOWER ANTERIOR RESECTION N/A 01/16/2019   Procedure: XI ROBOTIC RESECTION OF SIGMOID COLON;  Surgeon: [name], MD;  Location: [facility];  Service: General;  Laterality: N/A;    Current Medications: Current Meds  Medication Sig   aspirin EC 81 MG tablet Take 81 mg by mouth at bedtime.   clopidogrel (PLAVIX) 75 MG tablet Take 1 tablet (75 mg total) by mouth at bedtime. Please keep upcoming appt  in December 2022 with Dr. [name] before anymore refills. Thank you   fenofibrate 160 MG tablet Take 160 mg by mouth at bedtime.   glipiZIDE (GLUCOTROL XL) 5 MG 24 hr tablet Take 5 mg by mouth daily.   glucose blood (ONETOUCH ULTRA) test strip daily.   Lancets MISC See admin instructions.   metoprolol succinate (TOPROL-XL) 25 MG 24 hr tablet Take 1 tablet (25 mg total) by mouth daily.   Multiple Vitamins-Minerals (MULTIVITAMIN ADULT PO) Take 1 tablet by mouth daily.    nitroGLYCERIN (NITROSTAT) 0.4 MG SL tablet Place 0.4 mg under the tongue every 5 (five) minutes as needed for chest pain.   Omega-3 Fatty Acids (FISH OIL) 1000 MG CAPS 1,200 mg daily.   pantoprazole (PROTONIX) 40 MG tablet Take 40 mg by mouth at bedtime.   polyethylene glycol (MIRALAX / GLYCOLAX) 17 g packet Take 17 g by mouth 2 (two) times daily.   rosuvastatin (CRESTOR) 20 MG tablet Take 20 mg by mouth daily.     Allergies:   Augmentin [amoxicillin-pot clavulanate], Lipitor [atorvastatin], Ciprofloxacin, and Daypro [oxaprozin]   Social History   Socioeconomic History   Marital status: Married    Spouse name: Not on file   Number of children: Not on file   Years of education: Not on file   Highest education level: Not on file  Occupational History   Occupation: retired  Tobacco Use   Smoking status: Former    Years: 35.00    Types: Cigarettes   Smokeless tobacco: Never  Scientific laboratory technician Use: Never used  Substance and Sexual Activity   Alcohol use: No   Drug use: No   Sexual activity: Not on file  Other Topics Concern   Not on file  Social History Narrative   Not on file   Social Determinants of Health   Financial Resource Strain: Not on file  Food Insecurity: Not on file  Transportation Needs: Not on file  Physical Activity: Not on file  Stress: Not on file  Social Connections: Not on file     Family History: The patient's family history includes Colon cancer in his mother; Coronary artery  disease in his brother; Heart attack in his father; Thyroid cancer in his brother.  ROS:   Please see the history of present illness. All other systems are reviewed and negative.    EKGs/Labs/Other Studies Reviewed:    The following studies were reviewed today:  CT Abdomen/Pelvis 02/06/2019: COMPARISON:  CT 12/28/1999   FINDINGS: Lower chest: Lung bases are clear.   Hepatobiliary: No focal hepatic lesion.  Normal gallbladder   Pancreas: Pancreas is normal. No ductal dilatation. No pancreatic inflammation.   Spleen: Normal spleen   Adrenals/urinary tract: Adrenal glands and kidneys are normal. The ureters and bladder normal.   Stomach/Bowel: Stomach, duodenum, and small bowel  normal. Terminal ileum is normal. Appendix is.   Normal ascending and transverse colon descending colon normal. Anastomosis in the proximal sigmoid colon. No obstruction. The rectum is normal.   There is a series of small fluid collections connected to each other within the sigmoid mesocolon. These begin just inferior to the LEFT kidney along the psoas muscles and tract medially between the iliac vessels and extending inferiorly to the presacral space (image 52 through 67 series 2). The small interconnected fluid collections have thin enhancing rim. For example collection anterior to the LEFT psoas muscle 2.9 x 1.4 cm. Collection anterior to the L1 vertebral body measuring 2.0 by 2.2 cm. Collection in the presacral space measuring 2.8 x 1.7 cm. These collections approaches the sigmoid anastomosis however, no clear communication between colon anastomosis and these collections   Vascular/Lymphatic: Abdominal aorta is normal caliber with atherosclerotic calcification. There is no retroperitoneal or periportal lymphadenopathy. No pelvic lymphadenopathy. Prior graft repair of the aorta.   Reproductive: Prostate normal   Other: No intraperitoneal free air   Musculoskeletal: No aggressive osseous  lesion posterior lumbar fusion and decompression.   IMPRESSION: 1. Several small interconnected fluid collections within the sigmoid mesocolon which extend from the LEFT psoas at the pelvic brim and travel through the sigmoid mesocolon to the presacral space. The enhancing rim to these collections is concerning for abscesses. 2. No evidence of break down of the surgical anastomosis. 3. No bowel obstruction or acute diverticulitis.  CT-A Chest 01/31/2019: COMPARISON:  October 31, 2010   FINDINGS: Cardiovascular: Satisfactory opacification of the pulmonary arteries to the segmental level. No evidence of pulmonary embolism. Normal heart size. No pericardial effusion.   Mediastinum/Nodes: No enlarged mediastinal, hilar, or axillary lymph nodes. Thyroid gland, trachea, and esophagus demonstrate no significant findings.   Lungs/Pleura: Lungs are clear. No pleural effusion or pneumothorax.   Upper Abdomen: No acute abnormality.   Musculoskeletal: No chest wall abnormality. Degenerative joint changes of spine are noted.   Review of the MIP images confirms the above findings.   IMPRESSION: 1. No pulmonary embolus. 2. No acute abnormality identified in the chest.  ECHO 09/28/18:  1. The left ventricle has normal systolic function with an ejection  fraction of 60-65%. The cavity size was normal. There is mildly increased  left ventricular wall thickness. Left ventricular diastolic Doppler  parameters are consistent with impaired  relaxation.   2. The right ventricle has normal systolic function. The cavity was  normal. There is no increase in right ventricular wall thickness. Right  ventricular systolic pressure could not be assessed.   3. The aortic valve is tricuspid. Moderate sclerosis of the aortic valve.  No stenosis of the aortic valve.   LHC 12/08/2014: The left ventricular systolic function is normal. IMPRESSION: Mr. Canning has a widely patent RCA stent. The remainder of  his coronary anatomy shows no evidence of obstructive disease. His LV function is normal. I believe his chest pain is noncardiac. The procedure was done radially. He received radial cocktail as well as weight-based heparin (4500 units). The sheath was removed and a TR band was placed on the right wrist to achieve patent hemostasis. The patient left the laboratory stable condition. He'll be discharged home later today and will follow-up as an outpatient with Dr. [name] .  Diagnostic Dominance: Right    EKG:  EKG is personally reviewed and interpreted. 02/10/2021: Sinus rhythm. Rate 70 bpm. 01/09/2020: sinus rhythm 78 with no other abnormalities. 06/23/16: normal sinus rhythm heart rate 73  bpm nonspecific T-wave changes.  11/13/14: sinus rhythm, no other abnormalities, 77 bpm.  10/04/13: Sinus rhythm rate 69 with no other significant abnormalities.   No change from prior.     Recent Labs: No results found for requested labs within last 8760 hours.   Recent Lipid Panel    Component Value Date/Time   CHOL 113 12/21/2012 0932   TRIG 141 11/06/2018 0541   TRIG 126 12/21/2012 0932   HDL 35 (L) 12/21/2012 0932   CHOLHDL 5.3 01/23/2010 0400   VLDL 38 01/23/2010 0400   LDLCALC 53 12/21/2012 0932     Physical Exam:    VS:  BP 126/82    Pulse 70    Ht 5' 8.5" (1.74 m)    Wt 200 lb 6.4 oz (90.9 kg)    SpO2 97%    BMI 30.03 kg/m     Wt Readings from Last 3 Encounters:  02/10/21 200 lb 6.4 oz (90.9 kg)  01/09/20 199 lb (90.3 kg)  02/07/19 170 lb (77.1 kg)     GEN:  Well nourished, well developed in no acute distress HEENT: Normal NECK: No JVD; No carotid bruits LYMPHATICS: No lymphadenopathy CARDIAC: RRR, no murmurs, rubs, gallops RESPIRATORY:  Clear to auscultation without rales, wheezing or rhonchi  ABDOMEN: Soft, non-tender, non-distended, abdominal scar noted MUSCULOSKELETAL:  No edema; No deformity  SKIN: Warm and dry NEUROLOGIC:  Alert and oriented x 3 PSYCHIATRIC:  Normal  affect   ASSESSMENT:    1. Coronary artery disease involving native coronary artery of native heart without angina pectoris   2. Peripheral vascular disease ([place])   3. Diabetes mellitus with peripheral artery disease (HCC)     PLAN:    In order of problems listed above:  Coronary artery disease involving native coronary artery of native heart without angina pectoris Prior Bevin her stent and 2010 with 2 subsequent cardiac catheterization showing patent stent.  Overall doing quite well without any anginal symptoms.  Continue with goal-directed medical therapy.  He is on both aspirin and Plavix given his peripheral vascular disease as well.  No bleeding episodes.  Hemoglobin 14.6.  Crestor 20 mg.  Excellent LDL 50.  Also on Toprol-XL 25 mg a day.  Peripheral vascular disease ([place]) Prior scar on abdomen noted.  Aortobifemoral Dr. Donnetta Hutching in the early 2000's.  Excellent.  Goal-directed medical therapy.  Diabetes mellitus with peripheral artery disease ([place]) Working closely with Dr. Brigitte Pulse, hemoglobin A1c 6.4.  Diabetic medications reviewed.  No changes.   Follow-up:  1 year   Shared Decision Making/Informed Consent        Medication Adjustments/Labs and Tests Ordered: Current medicines are reviewed at length with the patient today.  Concerns regarding medicines are outlined above.   Orders Placed This Encounter  Procedures   EKG 12-Lead   No orders of the defined types were placed in this encounter.  Patient Instructions  Medication Instructions:  The current medical regimen is effective;  continue present plan and medications.  *If you need a refill on your cardiac medications before your next appointment, please call your pharmacy*  Follow-Up: At [facility], you and your health needs are our priority.  As part of our continuing mission to provide you with exceptional heart care, we have created designated Provider Care Teams.  These Care Teams include your primary  Cardiologist (physician) and Advanced Practice Providers (APPs -  Physician Assistants and Nurse Practitioners) who all work together to provide you with the care you need, when  you need it.  We recommend signing up for the patient portal called "MyChart".  Sign up information is provided on this After Visit Summary.  MyChart is used to connect with patients for Virtual Visits (Telemedicine).  Patients are able to view lab/test results, encounter notes, upcoming appointments, etc.  Non-urgent messages can be sent to your provider as well.   To learn more about what you can do with MyChart, go to [email or web address].    Your next appointment:   1 year(s)  The format for your next appointment:   In Person  Provider:   Candee Furbish, MD     Thank you for choosing [facility]!!      I,Mathew Stumpf,acting as a scribe for Candee Furbish, MD.,have documented all relevant documentation on the behalf of Candee Furbish, MD,as directed by  Candee Furbish, MD while in the presence of Candee Furbish, MD.  I, Candee Furbish, MD, have reviewed all documentation for this visit. The documentation on 02/10/21 for the exam, diagnosis, procedures, and orders are all accurate and complete.   Signed, Candee Furbish, MD  02/10/2021 9:04 AM    [facility]

## 2021-02-10 ENCOUNTER — Ambulatory Visit: Payer: Medicare Other | Admitting: Cardiology

## 2021-02-10 ENCOUNTER — Other Ambulatory Visit: Payer: Self-pay

## 2021-02-10 ENCOUNTER — Encounter: Payer: Self-pay | Admitting: Cardiology

## 2021-02-10 VITALS — BP 126/82 | HR 70 | Ht 68.5 in | Wt 200.4 lb

## 2021-02-10 DIAGNOSIS — E1151 Type 2 diabetes mellitus with diabetic peripheral angiopathy without gangrene: Secondary | ICD-10-CM | POA: Diagnosis not present

## 2021-02-10 DIAGNOSIS — I739 Peripheral vascular disease, unspecified: Secondary | ICD-10-CM

## 2021-02-10 DIAGNOSIS — I2510 Atherosclerotic heart disease of native coronary artery without angina pectoris: Secondary | ICD-10-CM | POA: Diagnosis not present

## 2021-02-10 NOTE — Assessment & Plan Note (Signed)
Prior Bevin her stent and 2010 with 2 subsequent cardiac catheterization showing patent stent.  Overall doing quite well without any anginal symptoms.  Continue with goal-directed medical therapy.  He is on both aspirin and Plavix given his peripheral vascular disease as well.  No bleeding episodes.  Hemoglobin 14.6.  Crestor 20 mg.  Excellent LDL 50.  Also on Toprol-XL 25 mg a day.

## 2021-02-10 NOTE — Patient Instructions (Signed)
Medication Instructions:  The current medical regimen is effective;  continue present plan and medications.  *If you need a refill on your cardiac medications before your next appointment, please call your pharmacy*  Follow-Up: At [facility], you and your health needs are our priority.  As part of our continuing mission to provide you with exceptional heart care, we have created designated Provider Care Teams.  These Care Teams include your primary Cardiologist (physician) and Advanced Practice Providers (APPs -  Physician Assistants and Nurse Practitioners) who all work together to provide you with the care you need, when you need it.  We recommend signing up for the patient portal called "MyChart".  Sign up information is provided on this After Visit Summary.  MyChart is used to connect with patients for Virtual Visits (Telemedicine).  Patients are able to view lab/test results, encounter notes, upcoming appointments, etc.  Non-urgent messages can be sent to your provider as well.   To learn more about what you can do with MyChart, go to [email or web address].    Your next appointment:   1 year(s)  The format for your next appointment:   In Person  Provider:   Mark Skains, MD   Thank you for choosing [facility]!!    

## 2021-02-10 NOTE — Assessment & Plan Note (Signed)
Prior scar on abdomen noted.  Aortobifemoral Dr. Donnetta Hutching in the early 2000's.  Excellent.  Goal-directed medical therapy.

## 2021-02-10 NOTE — Assessment & Plan Note (Signed)
Working closely with Dr. Brigitte Pulse, hemoglobin A1c 6.4.  Diabetic medications reviewed.  No changes.

## 2021-02-18 DIAGNOSIS — R0981 Nasal congestion: Secondary | ICD-10-CM | POA: Diagnosis not present

## 2021-02-18 DIAGNOSIS — L02429 Furuncle of limb, unspecified: Secondary | ICD-10-CM | POA: Diagnosis not present

## 2021-03-26 ENCOUNTER — Other Ambulatory Visit: Payer: Self-pay | Admitting: Cardiology

## 2021-04-20 DIAGNOSIS — E119 Type 2 diabetes mellitus without complications: Secondary | ICD-10-CM | POA: Diagnosis not present

## 2021-04-20 DIAGNOSIS — Z961 Presence of intraocular lens: Secondary | ICD-10-CM | POA: Diagnosis not present

## 2021-06-18 DIAGNOSIS — E782 Mixed hyperlipidemia: Secondary | ICD-10-CM | POA: Diagnosis not present

## 2021-06-18 DIAGNOSIS — I119 Hypertensive heart disease without heart failure: Secondary | ICD-10-CM | POA: Diagnosis not present

## 2021-06-18 DIAGNOSIS — I739 Peripheral vascular disease, unspecified: Secondary | ICD-10-CM | POA: Diagnosis not present

## 2021-06-18 DIAGNOSIS — E1151 Type 2 diabetes mellitus with diabetic peripheral angiopathy without gangrene: Secondary | ICD-10-CM | POA: Diagnosis not present

## 2021-06-18 DIAGNOSIS — Z0000 Encounter for general adult medical examination without abnormal findings: Secondary | ICD-10-CM | POA: Diagnosis not present

## 2021-06-18 DIAGNOSIS — K2270 Barrett's esophagus without dysplasia: Secondary | ICD-10-CM | POA: Diagnosis not present

## 2021-06-18 DIAGNOSIS — I25119 Atherosclerotic heart disease of native coronary artery with unspecified angina pectoris: Secondary | ICD-10-CM | POA: Diagnosis not present

## 2021-06-18 DIAGNOSIS — L409 Psoriasis, unspecified: Secondary | ICD-10-CM | POA: Diagnosis not present

## 2021-06-18 DIAGNOSIS — L84 Corns and callosities: Secondary | ICD-10-CM | POA: Diagnosis not present

## 2021-06-18 DIAGNOSIS — G4733 Obstructive sleep apnea (adult) (pediatric): Secondary | ICD-10-CM | POA: Diagnosis not present

## 2021-06-18 DIAGNOSIS — M1219 Kaschin-Beck disease, multiple sites: Secondary | ICD-10-CM | POA: Diagnosis not present

## 2021-06-18 DIAGNOSIS — I700 Atherosclerosis of aorta: Secondary | ICD-10-CM | POA: Diagnosis not present

## 2021-07-21 DIAGNOSIS — J358 Other chronic diseases of tonsils and adenoids: Secondary | ICD-10-CM | POA: Diagnosis not present

## 2021-11-08 ENCOUNTER — Other Ambulatory Visit: Payer: Self-pay | Admitting: Cardiology

## 2021-11-09 MED ORDER — METOPROLOL SUCCINATE ER 25 MG PO TB24: 25.0000 mg | ORAL_TABLET | Freq: Every day | ORAL | 0 refills | Status: DC

## 2021-11-09 NOTE — Addendum Note (Signed)
Addended by: Gaetano Net on: 11/09/2021 10:00 AM   Modules accepted: Orders

## 2021-12-20 DIAGNOSIS — Z23 Encounter for immunization: Secondary | ICD-10-CM | POA: Diagnosis not present

## 2021-12-20 DIAGNOSIS — L409 Psoriasis, unspecified: Secondary | ICD-10-CM | POA: Diagnosis not present

## 2021-12-20 DIAGNOSIS — E1151 Type 2 diabetes mellitus with diabetic peripheral angiopathy without gangrene: Secondary | ICD-10-CM | POA: Diagnosis not present

## 2021-12-20 DIAGNOSIS — E782 Mixed hyperlipidemia: Secondary | ICD-10-CM | POA: Diagnosis not present

## 2021-12-20 DIAGNOSIS — J069 Acute upper respiratory infection, unspecified: Secondary | ICD-10-CM | POA: Diagnosis not present

## 2021-12-20 DIAGNOSIS — I119 Hypertensive heart disease without heart failure: Secondary | ICD-10-CM | POA: Diagnosis not present

## 2022-01-02 ENCOUNTER — Other Ambulatory Visit: Payer: Self-pay | Admitting: Cardiology

## 2022-02-11 ENCOUNTER — Other Ambulatory Visit: Payer: Self-pay | Admitting: Cardiology

## 2022-03-02 ENCOUNTER — Other Ambulatory Visit: Payer: Self-pay | Admitting: Cardiology

## 2022-03-03 ENCOUNTER — Other Ambulatory Visit: Payer: Self-pay | Admitting: Cardiology

## 2022-03-11 ENCOUNTER — Other Ambulatory Visit: Payer: Self-pay | Admitting: Cardiology

## 2022-03-22 ENCOUNTER — Other Ambulatory Visit: Payer: Self-pay | Admitting: Cardiology

## 2022-04-07 ENCOUNTER — Other Ambulatory Visit: Payer: Self-pay | Admitting: Cardiology

## 2022-04-19 ENCOUNTER — Other Ambulatory Visit: Payer: Self-pay | Admitting: Cardiology

## 2022-04-22 ENCOUNTER — Other Ambulatory Visit: Payer: Self-pay | Admitting: Cardiology

## 2022-05-23 DIAGNOSIS — E119 Type 2 diabetes mellitus without complications: Secondary | ICD-10-CM | POA: Diagnosis not present

## 2022-05-23 DIAGNOSIS — Z961 Presence of intraocular lens: Secondary | ICD-10-CM | POA: Diagnosis not present

## 2022-06-23 DIAGNOSIS — I119 Hypertensive heart disease without heart failure: Secondary | ICD-10-CM | POA: Diagnosis not present

## 2022-06-23 DIAGNOSIS — G4733 Obstructive sleep apnea (adult) (pediatric): Secondary | ICD-10-CM | POA: Diagnosis not present

## 2022-06-23 DIAGNOSIS — K2270 Barrett's esophagus without dysplasia: Secondary | ICD-10-CM | POA: Diagnosis not present

## 2022-06-23 DIAGNOSIS — Z0000 Encounter for general adult medical examination without abnormal findings: Secondary | ICD-10-CM | POA: Diagnosis not present

## 2022-06-23 DIAGNOSIS — M549 Dorsalgia, unspecified: Secondary | ICD-10-CM | POA: Diagnosis not present

## 2022-06-23 DIAGNOSIS — E1151 Type 2 diabetes mellitus with diabetic peripheral angiopathy without gangrene: Secondary | ICD-10-CM | POA: Diagnosis not present

## 2022-06-23 DIAGNOSIS — I700 Atherosclerosis of aorta: Secondary | ICD-10-CM | POA: Diagnosis not present

## 2022-06-23 DIAGNOSIS — E782 Mixed hyperlipidemia: Secondary | ICD-10-CM | POA: Diagnosis not present

## 2022-06-23 DIAGNOSIS — M1990 Unspecified osteoarthritis, unspecified site: Secondary | ICD-10-CM | POA: Diagnosis not present

## 2022-06-23 DIAGNOSIS — M1219 Kaschin-Beck disease, multiple sites: Secondary | ICD-10-CM | POA: Diagnosis not present

## 2022-06-23 DIAGNOSIS — I25119 Atherosclerotic heart disease of native coronary artery with unspecified angina pectoris: Secondary | ICD-10-CM | POA: Diagnosis not present

## 2022-08-22 NOTE — Progress Notes (Unsigned)
Cardiology Office Note:    Date:  08/23/2022  ID:  Ray Brown, DOB Oct 06, 1950, MRN [id number] PCP: Lupita Raider, MD  [facility] Providers Cardiologist:  Donato Schultz, MD       Patient Profile:      Coronary artery disease s/p 2.5 x 16 mm BMS to RCA in 09/2006 LHC 12/08/14: RCA stent patent, no other CAD TTE 09/28/2018: EF 60-65, G1 DD, normal RVSF, AV sclerosis, trivial MR Peripheral arterial disease  S/p R Iliac stent S/p aorto-bifemoral bypass in 11/2001 (Dr. Arbie Cookey) Diabetes mellitus  Hyperlipidemia OSA Hx of colon perforation        History of Present Illness:   Ray Brown is a 72 y.o. male who returns for follow-up of CAD.  He was last seen by Dr. Anne Fu in 01/2021.  He is here alone.  He is limited by back issues.  He has had 2 surgeries in the past.  He notes a history of spinal stenosis.  He describes bilateral hip pain with ambulation that causes him to stop to rest.  Rest improves his symptoms.  He has not had any follow-up on his vascular disease since 2017.  He has not had chest pain, shortness of breath, syncope, orthopnea, leg edema.  He does not smoke.  Review of Systems  Gastrointestinal:  Negative for hematochezia and melena.  Genitourinary:  Negative for hematuria.   see HPI    Studies Reviewed:   EKG Interpretation Date/Time:  Tuesday August 23 2022 07:41:19 EDT Ventricular Rate:  71 PR Interval:  148 QRS Duration:  100 QT Interval:  394 QTC Calculation: 428 R Axis:   -26  Text Interpretation: Normal sinus rhythm Normal ECG No change when compared to ECG 02/10/21 Confirmed by Tereso Newcomer [phone]) on 08/23/2022 8:01:33 AM   Risk Assessment/Calculations:             Physical Exam:   VS:  BP 118/60   Pulse 71   Ht 5' 8.5" (1.74 m)   Wt 198 lb 3.2 oz (89.9 kg)   SpO2 96%   BMI 29.70 kg/m    Wt Readings from Last 3 Encounters:  08/23/22 198 lb 3.2 oz (89.9 kg)  02/10/21 200 lb 6.4 oz (90.9 kg)  01/09/20 199 lb (90.3 kg)     Constitutional:      Appearance: Healthy appearance. Not in distress.  Neck:     Vascular: No carotid bruit. JVD normal.  Pulmonary:     Breath sounds: Normal breath sounds. No wheezing. No rales.  Cardiovascular:     Normal rate. Regular rhythm.     Murmurs: There is no murmur.  Edema:    Peripheral edema absent.  Abdominal:     Palpations: Abdomen is soft.       ASSESSMENT AND PLAN:   Coronary artery disease involving native coronary artery of native heart without angina pectoris History of bare-metal stent to the RCA in 2008.  Cardiac catheterization in 2016 with patent RCA stent and no other CAD.  He is doing well without chest pain to suggest angina.  He has remained on dual antiplatelet therapy long-term given history of CAD and peripheral arterial disease.  Continue ASA 81 mg daily, Plavix 75 mg daily, Crestor 20 mg daily.  Follow-up 1 year.  Peripheral vascular disease (HCC) History of aortobifemoral bypass in 2003.  He has not followed up with vascular surgery since 2017.  He does note bilateral hip pain that is concerning for claudication.  His  hip symptoms may also be related to his back issues.  Arrange ABIs and arterial Dopplers.  Continue ASA 81 mg daily, Plavix 75 mg daily, Crestor 20 mg daily.  Essential hypertension Blood pressure is well-controlled.  Continue Toprol-XL 25 mg daily.  Pure hypercholesterolemia Labs from primary care reviewed.  In May 2024, LDL was at goal at 55.  Triglycerides remain somewhat elevated at 191.  Continue fenofibrate 160 mg daily, fish oil, Crestor 20 mg daily.  Diabetes mellitus with peripheral artery disease (HCC) Labs from primary care reviewed.  Recent A1c in May 2024 was 7.4.  He remains on glipizide.  He could not tolerate metformin.  We discussed the benefits of SGLT2 inhibitors and GLP-1 agonists.  These may be options in the future if his sugars become difficult to control.    Dispo:  Return in about 1 year (around 08/23/2023)  for Routine Follow Up w/ Dr. Anne Fu.  Signed, Tereso Newcomer, PA-C

## 2022-08-23 ENCOUNTER — Encounter: Payer: Self-pay | Admitting: Physician Assistant

## 2022-08-23 ENCOUNTER — Ambulatory Visit: Payer: Medicare Other | Attending: Physician Assistant | Admitting: Physician Assistant

## 2022-08-23 VITALS — BP 118/60 | HR 71 | Ht 68.5 in | Wt 198.2 lb

## 2022-08-23 DIAGNOSIS — I10 Essential (primary) hypertension: Secondary | ICD-10-CM | POA: Insufficient documentation

## 2022-08-23 DIAGNOSIS — I2510 Atherosclerotic heart disease of native coronary artery without angina pectoris: Secondary | ICD-10-CM

## 2022-08-23 DIAGNOSIS — E7800 Pure hypercholesterolemia, unspecified: Secondary | ICD-10-CM | POA: Insufficient documentation

## 2022-08-23 DIAGNOSIS — E1151 Type 2 diabetes mellitus with diabetic peripheral angiopathy without gangrene: Secondary | ICD-10-CM

## 2022-08-23 DIAGNOSIS — Z7984 Long term (current) use of oral hypoglycemic drugs: Secondary | ICD-10-CM

## 2022-08-23 DIAGNOSIS — I739 Peripheral vascular disease, unspecified: Secondary | ICD-10-CM | POA: Diagnosis not present

## 2022-08-23 MED ORDER — CLOPIDOGREL BISULFATE 75 MG PO TABS: 75.0000 mg | ORAL_TABLET | Freq: Every day | ORAL | 3 refills | Status: DC

## 2022-08-23 MED ORDER — METOPROLOL SUCCINATE ER 25 MG PO TB24: 25.0000 mg | ORAL_TABLET | Freq: Every day | ORAL | 3 refills | Status: DC

## 2022-08-23 NOTE — Assessment & Plan Note (Signed)
History of aortobifemoral bypass in 2003.  He has not followed up with vascular surgery since 2017.  He does note bilateral hip pain that is concerning for claudication.  His hip symptoms may also be related to his back issues.  Arrange ABIs and arterial Dopplers.  Continue ASA 81 mg daily, Plavix 75 mg daily, Crestor 20 mg daily.

## 2022-08-23 NOTE — Patient Instructions (Signed)
Medication Instructions:  Your physician recommends that you continue on your current medications as directed. Please refer to the Current Medication list given to you today.  *If you need a refill on your cardiac medications before your next appointment, please call your pharmacy*   Lab Work: None  If you have labs (blood work) drawn today and your tests are completely normal, you will receive your results only by: MyChart Message (if you have MyChart) OR A paper copy in the mail If you have any lab test that is abnormal or we need to change your treatment, we will call you to review the results.   Testing/Procedures: Your physician has requested that you have a lower extremity arterial exercise duplex with ABI. During this test, exercise and ultrasound are used to evaluate arterial blood flow in the legs. Allow one hour for this exam. There are no restrictions or special instructions.    Follow-Up: At [facility], you and your health needs are our priority.  As part of our continuing mission to provide you with exceptional heart care, we have created designated Provider Care Teams.  These Care Teams include your primary Cardiologist (physician) and Advanced Practice Providers (APPs -  Physician Assistants and Nurse Practitioners) who all work together to provide you with the care you need, when you need it.  We recommend signing up for the patient portal called "MyChart".  Sign up information is provided on this After Visit Summary.  MyChart is used to connect with patients for Virtual Visits (Telemedicine).  Patients are able to view lab/test results, encounter notes, upcoming appointments, etc.  Non-urgent messages can be sent to your provider as well.   To learn more about what you can do with MyChart, go to [email or web address].    Your next appointment:   1 year(s)  Provider:   Donato Schultz, MD

## 2022-08-23 NOTE — Assessment & Plan Note (Signed)
Labs from primary care reviewed.  Recent A1c in May 2024 was 7.4.  He remains on glipizide.  He could not tolerate metformin.  We discussed the benefits of SGLT2 inhibitors and GLP-1 agonists.  These may be options in the future if his sugars become difficult to control.

## 2022-08-23 NOTE — Assessment & Plan Note (Signed)
History of bare-metal stent to the RCA in 2008.  Cardiac catheterization in 2016 with patent RCA stent and no other CAD.  He is doing well without chest pain to suggest angina.  He has remained on dual antiplatelet therapy long-term given history of CAD and peripheral arterial disease.  Continue ASA 81 mg daily, Plavix 75 mg daily, Crestor 20 mg daily.  Follow-up 1 year.

## 2022-08-23 NOTE — Assessment & Plan Note (Signed)
Labs from primary care reviewed.  In May 2024, LDL was at goal at 55.  Triglycerides remain somewhat elevated at 191.  Continue fenofibrate 160 mg daily, fish oil, Crestor 20 mg daily.

## 2022-08-23 NOTE — Assessment & Plan Note (Signed)
Blood pressure is well controlled.  Continue Toprol-XL 25 mg daily. 

## 2022-09-19 ENCOUNTER — Ambulatory Visit (HOSPITAL_COMMUNITY)
Admission: RE | Admit: 2022-09-19 | Discharge: 2022-09-19 | Disposition: A | Discharge status: Home / Self Care | Payer: Medicare Other | Source: Ambulatory Visit | Attending: Physician Assistant | Admitting: Physician Assistant

## 2022-09-19 ENCOUNTER — Ambulatory Visit (HOSPITAL_BASED_OUTPATIENT_CLINIC_OR_DEPARTMENT_OTHER)
Admission: RE | Admit: 2022-09-19 | Discharge: 2022-09-19 | Disposition: A | Discharge status: Home / Self Care | Payer: Medicare Other | Source: Ambulatory Visit | Attending: Cardiology | Admitting: Cardiology

## 2022-09-19 DIAGNOSIS — I739 Peripheral vascular disease, unspecified: Secondary | ICD-10-CM | POA: Diagnosis not present

## 2022-09-19 DIAGNOSIS — Z95820 Peripheral vascular angioplasty status with implants and grafts: Secondary | ICD-10-CM

## 2022-09-19 LAB — VAS US ABI WITH/WO TBI
Left ABI: 0.93
Right ABI: 1.06

## 2022-09-20 ENCOUNTER — Telehealth: Payer: Self-pay

## 2022-09-20 DIAGNOSIS — Z95828 Presence of other vascular implants and grafts: Secondary | ICD-10-CM

## 2022-09-20 NOTE — Telephone Encounter (Signed)
-----   Message from Jacolyn Reedy sent at 09/20/2022  8:36 AM EDT ----- Covering for [employer]. Dopplers show stenosis in distal bypass graft but ABI's are ok. Please refer back to vascular surgeon-has seen Dr. Tawanna Cooler Early in the past. thanks

## 2022-10-04 ENCOUNTER — Other Ambulatory Visit: Payer: Self-pay | Admitting: Family Medicine

## 2022-10-04 DIAGNOSIS — R300 Dysuria: Secondary | ICD-10-CM | POA: Diagnosis not present

## 2022-10-04 DIAGNOSIS — R109 Unspecified abdominal pain: Secondary | ICD-10-CM | POA: Diagnosis not present

## 2022-10-05 ENCOUNTER — Ambulatory Visit
Admission: RE | Admit: 2022-10-05 | Discharge: 2022-10-05 | Disposition: A | Discharge status: Home / Self Care | Payer: Medicare Other | Source: Ambulatory Visit | Attending: Family Medicine | Admitting: Family Medicine

## 2022-10-05 DIAGNOSIS — K760 Fatty (change of) liver, not elsewhere classified: Secondary | ICD-10-CM | POA: Diagnosis not present

## 2022-10-05 DIAGNOSIS — R109 Unspecified abdominal pain: Secondary | ICD-10-CM

## 2022-10-24 NOTE — Progress Notes (Unsigned)
[facility] [place]  Patient name: Ray Brown MRN: [id number] DOB: 1950/12/05 Sex: male  REASON FOR CONSULT: Hip and buttock claudication  HPI: Ray Brown is a 72 y.o. male, who is known to me from prior aortobifemoral bypass in 2003. He has done very well since his surgery. He reports that he does have great deal difficulty with chronic low back discomfort. He reports that over the last 1-2 years he has had episodes of pain specifically in his hip joints bilaterally extending into his lateral thighs bilaterally. This only occurs with walking and is worse if he is carrying something or if he is walking uphill. This is relieved with a short amount of rest. Lower extremity tissue loss. He has no claudication symptoms below his knee. He does have a great deal of calf cramping at night. Does have history of coronary artery disease which is stable currently.  Past Medical History:  Diagnosis Date   Arthritis    Bronchitis    Chronic back pain    Complication of anesthesia    difficulty breathing s/p back surgery [facility]   Coronary artery disease    Diabetes mellitus without complication (HCC)    Dry skin    in the back of head-left side   GERD (gastroesophageal reflux disease)    H/O hiatal hernia    Hypercholesteremia    Peripheral vascular disease (HCC)    Pneumonia    Shortness of breath    Sleep apnea    has CPAP but does not use   Unstable angina (HCC) 12/05/2014    Family History  Problem Relation Age of Onset   Colon cancer Mother    Heart attack Father        , massive   Coronary artery disease Brother    Thyroid cancer Brother     SOCIAL HISTORY: Social History   Socioeconomic History   Marital status: Married    Spouse name: Not on file   Number of children: Not on file   Years of education: Not on file   Highest education level: Not on file  Occupational History   Occupation: retired  Tobacco Use   Smoking status: Former     Types: Cigarettes   Smokeless tobacco: Never  Vaping Use   Vaping status: Never Used  Substance and Sexual Activity   Alcohol use: No   Drug use: No   Sexual activity: Not on file  Other Topics Concern   Not on file  Social History Narrative   Not on file   Social Determinants of Health   Financial Resource Strain: Not on file  Food Insecurity: Not on file  Transportation Needs: Not on file  Physical Activity: Not on file  Stress: Not on file  Social Connections: Not on file  Intimate Partner Violence: Not on file    Allergies  Allergen Reactions   Augmentin [Amoxicillin-Pot Clavulanate] Nausea And Vomiting   Lipitor [Atorvastatin]     Muscle pain   Ciprofloxacin Rash   Oxaprozin Rash    Other Reaction(s): Not available    Current Outpatient Medications  Medication Sig Dispense Refill   aspirin EC 81 MG tablet Take 81 mg by mouth at bedtime.     clopidogrel (PLAVIX) 75 MG tablet Take 1 tablet (75 mg total) by mouth at bedtime. 90 tablet 3   fenofibrate 160 MG tablet Take 160 mg by mouth at bedtime.     glipiZIDE (GLUCOTROL XL) 5  MG 24 hr tablet Take 5 mg by mouth daily.     glucose blood (ONETOUCH ULTRA) test strip daily.     Lancets MISC See admin instructions.     meclizine (ANTIVERT) 25 MG tablet Take 25 mg by mouth as needed for dizziness or nausea.     metoprolol succinate (TOPROL-XL) 25 MG 24 hr tablet Take 1 tablet (25 mg total) by mouth daily. 90 tablet 3   Multiple Vitamins-Minerals (MULTIVITAMIN ADULT PO) Take 1 tablet by mouth daily.      nitroGLYCERIN (NITROSTAT) 0.4 MG SL tablet Place 0.4 mg under the tongue every 5 (five) minutes as needed for chest pain.     Omega-3 Fatty Acids (FISH OIL) 1000 MG CAPS 1,200 mg daily.     ONETOUCH ULTRA test strip CHECK BLOOD SUGAR ONCE DAILY     pantoprazole (PROTONIX) 40 MG tablet Take 40 mg by mouth at bedtime.     polyethylene glycol (MIRALAX / GLYCOLAX) 17 g packet Take 17 g by mouth 2 (two) times daily. 14 each  0   rosuvastatin (CRESTOR) 20 MG tablet Take 20 mg by mouth daily.     No current facility-administered medications for this visit.    REVIEW OF SYSTEMS:  [X]  denotes positive finding, [ ]  denotes negative finding Cardiac  Comments:  Chest pain or chest pressure:    Shortness of breath upon exertion:    Short of breath when lying flat:    Irregular heart rhythm:        Vascular    Pain in calf, thigh, or hip brought on by ambulation: x   Pain in feet at night that wakes you up from your sleep:  x   Blood clot in your veins:    Leg swelling:         Pulmonary    Oxygen at home:    Productive cough:     Wheezing:         Neurologic    Sudden weakness in arms or legs:     Sudden numbness in arms or legs:     Sudden onset of difficulty speaking or slurred speech:    Temporary loss of vision in one eye:     Problems with dizziness:         Gastrointestinal    Blood in stool:     Vomited blood:         Genitourinary    Burning when urinating:     Blood in urine:        Psychiatric    Major depression:         Hematologic    Bleeding problems:    Problems with blood clotting too easily:        Skin    Rashes or ulcers:        Constitutional    Fever or chills:      PHYSICAL EXAM: There were no vitals filed for this visit.   GENERAL: The patient is a well-nourished male, in no acute distress. The vital signs are documented above. CARDIAC: There is a regular rate and rhythm.  VASCULAR: 2+ radial pulses bilaterally 2+ femoral graft pulses bilaterally, 2+ dorsalis pedis pulses bilaterally PULMONARY: There is good air exchange bilaterally without wheezing or rales. ABDOMEN: Soft and non-tender with normal pitched bowel sounds. Well-healed abdominal incision with no hernias present MUSCULOSKELETAL: There are no major deformities or cyanosis. NEUROLOGIC: No focal weakness or paresthesias are detected. SKIN: There are no ulcers or  rashes noted. PSYCHIATRIC: The  patient has a normal affect.  DATA:  He will underwent noninvasive vascular laboratory studies are office today. This reveals completely normal triphasic waveforms in his posterior tibial and dorsalis pedis pulses bilaterally his ankle arm index is also normal at greater than 1.0 bilaterally  He did have a CT angiogram approximately 1-1/2 years ago and I reviewed this as well. This does show patency of his aortobifemoral bypass. He does have flow into his pelvis the internal iliac collaterals   MEDICAL ISSUES: Had long discussion with patient regarding this. He has completely normal flow through his aortofemoral bypass from 14 years ago. He does have no evidence of lower from the arterial insufficiency below this with normal noninvasive studies. I explained that this should rule out arterial insufficiency as the cause of his claudication. I did explain the occasional finding of diminished flow to the internal iliac system. Explained that we could evaluate this further with catheter-based arteriogram but that it would be unlikely that this was the cause of his symptoms and even if concern was identified be difficult to treat. I have suggested that he see Dr.Cram again for evaluation of his lumbar spine rule this out as a potential calls. If he continues to have significant discomfort he will notify us if all other workup is negative to further discuss arteriography for further evaluation. He understands this is not limb threatening and that he is completely safe to continue walking despite the discomfort   Leonie Douglas [facility] [place] Beeper: [phone]

## 2022-10-25 ENCOUNTER — Ambulatory Visit: Payer: Medicare Other | Admitting: Vascular Surgery

## 2022-10-25 ENCOUNTER — Encounter: Payer: Self-pay | Admitting: Vascular Surgery

## 2022-10-25 VITALS — BP 140/83 | HR 69 | Temp 98.0°F | Resp 20 | Ht 68.5 in | Wt 195.0 lb

## 2022-10-25 DIAGNOSIS — I739 Peripheral vascular disease, unspecified: Secondary | ICD-10-CM

## 2022-11-08 ENCOUNTER — Other Ambulatory Visit: Payer: Self-pay

## 2022-11-08 DIAGNOSIS — I739 Peripheral vascular disease, unspecified: Secondary | ICD-10-CM

## 2022-12-26 DIAGNOSIS — E1142 Type 2 diabetes mellitus with diabetic polyneuropathy: Secondary | ICD-10-CM | POA: Diagnosis not present

## 2022-12-26 DIAGNOSIS — E1151 Type 2 diabetes mellitus with diabetic peripheral angiopathy without gangrene: Secondary | ICD-10-CM | POA: Diagnosis not present

## 2022-12-26 DIAGNOSIS — I25119 Atherosclerotic heart disease of native coronary artery with unspecified angina pectoris: Secondary | ICD-10-CM | POA: Diagnosis not present

## 2022-12-26 DIAGNOSIS — H8110 Benign paroxysmal vertigo, unspecified ear: Secondary | ICD-10-CM | POA: Diagnosis not present

## 2022-12-26 DIAGNOSIS — I119 Hypertensive heart disease without heart failure: Secondary | ICD-10-CM | POA: Diagnosis not present

## 2022-12-26 DIAGNOSIS — E782 Mixed hyperlipidemia: Secondary | ICD-10-CM | POA: Diagnosis not present

## 2022-12-26 LAB — LAB REPORT - SCANNED
A1c: 7.1
EGFR: 79

## 2023-01-23 NOTE — Progress Notes (Unsigned)
[facility]IN SPECIALISTS OF [place]  ASSESSMENT / PLAN: 72 y.o. male with history of aortobifemoral bypass in 2003.  He has some atherosclerotic stenosis about the left distal anastomosis.  He has preservation of a palpable pulse in his left dorsalis pedis artery.  I feel symptoms are more likely musculoskeletal in nature, and the patient agrees.  Will plan to observe the stenosis noted on ultrasound with a repeat duplex evaluation in 3 months.  CHIEF COMPLAINT: Back and hip pain  HISTORY OF PRESENT ILLNESS: Ray Brown is a 72 y.o. male who presents to clinic for evaluation of abnormality seen on duplex ultrasound.  The patient is a patient of Dr. Bosie Helper.  He underwent aortobifemoral bypass in 2003.  He tolerated this very well and has had excellent primary patency since.  He last saw Dr. Arbie Cookey in 2017, and at that time reported significant discomfort in his hips and thighs.  He reports similar discomfort to me today.  He says he is fairly sure that this is musculoskeletal, and he has had multiple procedures on his back.  Duplex ultrasound and ankle-brachial index was performed by his cardiology team which did show some stenosis in the left femoral anastomosis.  Past Medical History:  Diagnosis Date   Arthritis    Bronchitis    Chronic back pain    Complication of anesthesia    difficulty breathing s/p back surgery [facility]   Coronary artery disease    Diabetes mellitus without complication (HCC)    Dry skin    in the back of head-left side   GERD (gastroesophageal reflux disease)    H/O hiatal hernia    Hypercholesteremia    Peripheral vascular disease (HCC)    Pneumonia    Shortness of breath    Sleep apnea    has CPAP but does not use   Unstable angina (HCC) 12/05/2014    Past Surgical History:  Procedure Laterality Date   AORTA - BILATERAL FEMORAL ARTERY BYPASS GRAFT  11/27/2001   Dr Tawanna Cooler Early.  Aortobifemoral bypass with a 14 x 8 Hemashield graft.   BACK  SURGERY  2004   CARDIAC CATHETERIZATION  2010   x 2 stents RCA   CARDIAC CATHETERIZATION N/A 12/08/2014   Procedure: Left Heart Cath and Coronary Angiography;  Surgeon: Runell Gess, MD;  Location: [facility] INVASIVE CV LAB;  Service: Cardiovascular;  Laterality: N/A;   COLONOSCOPY  08/04/2009   CORONARY ANGIOPLASTY     2 stents   FOOT SURGERY  1980s   right foot   LYSIS OF ADHESION N/A 01/16/2019   Procedure: LYSIS OF ADHESIONS;  Surgeon: Karie Soda, MD;  Location: [facility] ORS;  Service: General;  Laterality: N/A;   PROCTOSCOPY N/A 01/16/2019   Procedure: RIGID PROCTOSCOPY;  Surgeon: Karie Soda, MD;  Location: [facility] ORS;  Service: General;  Laterality: N/A;   SHOULDER ARTHROSCOPY WITH SUBACROMIAL DECOMPRESSION Right 03/28/2013   Procedure: RIGHT SHOULDER ARTHROSCOPY WITH SUBACROMIAL DECOMPRESSION AND DISTAL CLAVICLE RESECTION;  Surgeon: Senaida Lange, MD;  Location: [facility] OR;  Service: Orthopedics;  Laterality: Right;   SHOULDER SURGERY     left shoulder rotator cuff repair   UPPER GI ENDOSCOPY     XI ROBOTIC ASSISTED LOWER ANTERIOR RESECTION N/A 01/16/2019   Procedure: XI ROBOTIC RESECTION OF SIGMOID COLON;  Surgeon: Karie Soda, MD;  Location: [facility] OR[facility];  Service: General;  Laterality: N/A;    Family History  Problem Relation Age of Onset   Colon cancer Mother  Heart attack Father        , massive   Coronary artery disease Brother    Thyroid cancer Brother     Social History   Socioeconomic History   Marital status: Married    Spouse name: Not on file   Number of children: Not on file   Years of education: Not on file   Highest education level: Not on file  Occupational History   Occupation: retired  Tobacco Use   Smoking status: Former    Types: Cigarettes   Smokeless tobacco: Never  Vaping Use   Vaping status: Never Used  Substance and Sexual Activity   Alcohol use: No   Drug use: No   Sexual activity: Not on file  Other Topics Concern   Not on file  Social History  Narrative   Not on file   Social Determinants of Health   Financial Resource Strain: Not on file  Food Insecurity: Not on file  Transportation Needs: Not on file  Physical Activity: Not on file  Stress: Not on file  Social Connections: Not on file  Intimate Partner Violence: Not on file    Allergies  Allergen Reactions   Augmentin [Amoxicillin-Pot Clavulanate] Nausea And Vomiting   Lipitor [Atorvastatin]     Muscle pain   Ciprofloxacin Rash   Oxaprozin Rash    Other Reaction(s): Not available    Current Outpatient Medications  Medication Sig Dispense Refill   aspirin EC 81 MG tablet Take 81 mg by mouth at bedtime.     clopidogrel (PLAVIX) 75 MG tablet Take 1 tablet (75 mg total) by mouth at bedtime. 90 tablet 3   fenofibrate 160 MG tablet Take 160 mg by mouth at bedtime.     glipiZIDE (GLUCOTROL XL) 5 MG 24 hr tablet Take 5 mg by mouth daily.     glucose blood (ONETOUCH ULTRA) test strip daily.     Lancets MISC See admin instructions.     meclizine (ANTIVERT) 25 MG tablet Take 25 mg by mouth as needed for dizziness or nausea.     metoprolol succinate (TOPROL-XL) 25 MG 24 hr tablet Take 1 tablet (25 mg total) by mouth daily. 90 tablet 3   Multiple Vitamins-Minerals (MULTIVITAMIN ADULT PO) Take 1 tablet by mouth daily.      nitroGLYCERIN (NITROSTAT) 0.4 MG SL tablet Place 0.4 mg under the tongue every 5 (five) minutes as needed for chest pain.     Omega-3 Fatty Acids (FISH OIL) 1000 MG CAPS 1,200 mg daily.     ONETOUCH ULTRA test strip CHECK BLOOD SUGAR ONCE DAILY     pantoprazole (PROTONIX) 40 MG tablet Take 40 mg by mouth at bedtime.     polyethylene glycol (MIRALAX / GLYCOLAX) 17 g packet Take 17 g by mouth 2 (two) times daily. 14 each 0   rosuvastatin (CRESTOR) 20 MG tablet Take 20 mg by mouth daily.     No current facility-administered medications for this visit.    PHYSICAL EXAM There were no vitals filed for this visit.  Elderly man in no distress Regular rate  and rhythm Unlabored breathing 2+ dorsalis pedis pulses bilaterally  PERTINENT LABORATORY AND RADIOLOGIC DATA  Most recent CBC    Latest Ref Rng & Units 01/31/2019    6:23 PM 01/19/2019    4:05 AM 01/18/2019    5:00 PM  CBC  WBC 4.0 - 10.5 K/uL 8.7  9.1  10.6   Hemoglobin 13.0 - 17.0 g/dL 82.9  7.4  7.5   Hematocrit 39.0 - 52.0 % 38.4  22.8  23.1   Platelets 150 - 400 K/uL 460  193  202      Most recent CMP    Latest Ref Rng & Units 01/31/2019    6:23 PM 01/17/2019    3:06 AM 01/14/2019    8:59 AM  CMP  Glucose 70 - 99 mg/dL 474  259  563   BUN 8 - 23 mg/dL 15  20  15    Creatinine 0.61 - 1.24 mg/dL 8.75  6.43  3.29   Sodium 135 - 145 mmol/L 140  133  142   Potassium 3.5 - 5.1 mmol/L 3.8  5.0  4.6   Chloride 98 - 111 mmol/L 103  101  107   CO2 22 - 32 mmol/L 25  20  25    Calcium 8.9 - 10.3 mg/dL 9.7  8.3  9.7   Total Protein 6.5 - 8.1 g/dL 6.9     Total Bilirubin 0.3 - 1.2 mg/dL 1.0     Alkaline Phos 38 - 126 U/L 92     AST 15 - 41 U/L 37     ALT 0 - 44 U/L 21       Renal function CrCl cannot be calculated (Patient's most recent lab result is older than the maximum 21 days allowed.).  Hgb A1c MFr Bld (%)  Date Value  01/14/2019 6.4 (H)    LDL (calc)  Date Value Ref Range Status  12/21/2012 53 <100 mg/dL Final    Comment:    LDL-C is inaccurate if patient is nonfasting.   Reference Range: ---------------- Optimal:            <100 Near/Above Optimal: 100-129 Borderline High:    130-159 High:               160-189 Very High:          >=190         +-------+-----------+-----------+------------+------------+  ABI/TBIToday's ABIToday's TBIPrevious ABIPrevious TBI  +-------+-----------+-----------+------------+------------+  Right 1.06       .78        1.17        .93           +-------+-----------+-----------+------------+------------+  Left  .93        .77        1.14        1.16           +-------+-----------+-----------+------------+------------+   Abdominal Aorta: No evidence of an abdominal aortic aneurysm was  visualized. The largest aortic measurement is 2.6 cm.  Stenosis:  Patent aortobifemoral BPG with stenosis seen in the distal left  anastamosis of the CFA.   IVC/Iliac: There is no evidence of thrombus involving the IVC.   Rande Brunt. Lenell Antu, MD FACS [facility] Phone Number: [phone] 01/23/2023 8:51 AM   Total time spent on preparing this encounter including chart review, data review, collecting history, examining the patient, coordinating care for this new patient, 45 minutes.  Portions of this report may have been transcribed using voice recognition software.  Every effort has been made to ensure accuracy; however, inadvertent computerized transcription errors may still be present.

## 2023-01-24 ENCOUNTER — Ambulatory Visit: Payer: Medicare Other | Admitting: Vascular Surgery

## 2023-01-24 ENCOUNTER — Ambulatory Visit (HOSPITAL_COMMUNITY)
Admission: RE | Admit: 2023-01-24 | Discharge: 2023-01-24 | Disposition: A | Discharge status: Home / Self Care | Payer: Medicare Other | Source: Ambulatory Visit | Attending: Vascular Surgery | Admitting: Vascular Surgery

## 2023-01-24 ENCOUNTER — Encounter: Payer: Self-pay | Admitting: Vascular Surgery

## 2023-01-24 ENCOUNTER — Ambulatory Visit (INDEPENDENT_AMBULATORY_CARE_PROVIDER_SITE_OTHER)
Admission: RE | Admit: 2023-01-24 | Discharge: 2023-01-24 | Disposition: A | Discharge status: Home / Self Care | Payer: Medicare Other | Source: Ambulatory Visit | Attending: Vascular Surgery | Admitting: Vascular Surgery

## 2023-01-24 VITALS — BP 141/76 | HR 67 | Temp 97.9°F | Resp 20 | Ht 68.5 in | Wt 193.0 lb

## 2023-01-24 DIAGNOSIS — I739 Peripheral vascular disease, unspecified: Secondary | ICD-10-CM | POA: Insufficient documentation

## 2023-01-24 LAB — VAS US ABI WITH/WO TBI
Left ABI: 1.07
Right ABI: 1.06

## 2023-01-27 ENCOUNTER — Other Ambulatory Visit: Payer: Self-pay

## 2023-01-27 DIAGNOSIS — I739 Peripheral vascular disease, unspecified: Secondary | ICD-10-CM

## 2023-02-28 DIAGNOSIS — E1142 Type 2 diabetes mellitus with diabetic polyneuropathy: Secondary | ICD-10-CM | POA: Diagnosis not present

## 2023-02-28 DIAGNOSIS — R059 Cough, unspecified: Secondary | ICD-10-CM | POA: Diagnosis not present

## 2023-02-28 DIAGNOSIS — J989 Respiratory disorder, unspecified: Secondary | ICD-10-CM | POA: Diagnosis not present

## 2023-06-26 ENCOUNTER — Other Ambulatory Visit: Payer: Self-pay | Admitting: Family Medicine

## 2023-06-26 DIAGNOSIS — H8110 Benign paroxysmal vertigo, unspecified ear: Secondary | ICD-10-CM

## 2023-06-26 DIAGNOSIS — M1219 Kaschin-Beck disease, multiple sites: Secondary | ICD-10-CM | POA: Diagnosis not present

## 2023-06-26 DIAGNOSIS — I25119 Atherosclerotic heart disease of native coronary artery with unspecified angina pectoris: Secondary | ICD-10-CM | POA: Diagnosis not present

## 2023-06-26 DIAGNOSIS — E782 Mixed hyperlipidemia: Secondary | ICD-10-CM | POA: Diagnosis not present

## 2023-06-26 DIAGNOSIS — R110 Nausea: Secondary | ICD-10-CM | POA: Diagnosis not present

## 2023-06-26 DIAGNOSIS — Z0000 Encounter for general adult medical examination without abnormal findings: Secondary | ICD-10-CM | POA: Diagnosis not present

## 2023-06-26 DIAGNOSIS — E1151 Type 2 diabetes mellitus with diabetic peripheral angiopathy without gangrene: Secondary | ICD-10-CM | POA: Diagnosis not present

## 2023-06-26 DIAGNOSIS — R109 Unspecified abdominal pain: Secondary | ICD-10-CM

## 2023-06-26 DIAGNOSIS — I119 Hypertensive heart disease without heart failure: Secondary | ICD-10-CM | POA: Diagnosis not present

## 2023-06-26 DIAGNOSIS — I739 Peripheral vascular disease, unspecified: Secondary | ICD-10-CM | POA: Diagnosis not present

## 2023-06-26 DIAGNOSIS — G4733 Obstructive sleep apnea (adult) (pediatric): Secondary | ICD-10-CM | POA: Diagnosis not present

## 2023-06-26 DIAGNOSIS — K2270 Barrett's esophagus without dysplasia: Secondary | ICD-10-CM | POA: Diagnosis not present

## 2023-06-26 LAB — LAB REPORT - SCANNED: A1c: 7.5

## 2023-07-11 ENCOUNTER — Ambulatory Visit
Admission: RE | Admit: 2023-07-11 | Discharge: 2023-07-11 | Disposition: A | Discharge status: Home / Self Care | Source: Ambulatory Visit | Attending: Family Medicine | Admitting: Family Medicine

## 2023-07-11 DIAGNOSIS — K858 Other acute pancreatitis without necrosis or infection: Secondary | ICD-10-CM | POA: Diagnosis not present

## 2023-07-11 DIAGNOSIS — R109 Unspecified abdominal pain: Secondary | ICD-10-CM

## 2023-07-11 MED ORDER — IOPAMIDOL (ISOVUE-300) INJECTION 61%
100.0000 mL | Freq: Once | INTRAVENOUS | Status: AC | PRN
  Administered 2023-07-11: 100 mL via INTRAVENOUS

## 2023-07-21 ENCOUNTER — Telehealth: Payer: Self-pay

## 2023-07-21 DIAGNOSIS — K219 Gastro-esophageal reflux disease without esophagitis: Secondary | ICD-10-CM | POA: Diagnosis not present

## 2023-07-21 DIAGNOSIS — R634 Abnormal weight loss: Secondary | ICD-10-CM | POA: Diagnosis not present

## 2023-07-21 DIAGNOSIS — Z86018 Personal history of other benign neoplasm: Secondary | ICD-10-CM | POA: Diagnosis not present

## 2023-07-21 DIAGNOSIS — I2510 Atherosclerotic heart disease of native coronary artery without angina pectoris: Secondary | ICD-10-CM | POA: Diagnosis not present

## 2023-07-21 DIAGNOSIS — K2270 Barrett's esophagus without dysplasia: Secondary | ICD-10-CM | POA: Diagnosis not present

## 2023-07-21 DIAGNOSIS — Z7902 Long term (current) use of antithrombotics/antiplatelets: Secondary | ICD-10-CM | POA: Diagnosis not present

## 2023-07-21 DIAGNOSIS — R110 Nausea: Secondary | ICD-10-CM | POA: Diagnosis not present

## 2023-07-21 NOTE — Telephone Encounter (Signed)
   Name: Ray Brown  DOB: 02-Jun-1950  MRN: [id number]  Primary Cardiologist: Dorothye Gathers, MD   Preoperative team, please contact this patient and set up a phone call appointment for further preoperative risk assessment. Please obtain consent and complete medication review. Thank you for your help. Last seen by Marlyse Single, 08/23/2022.   I confirm that guidance regarding antiplatelet and oral anticoagulation therapy has been completed and, if necessary, noted below.  Per office protocol, if patient is without any new symptoms or concerns at the time of their virtual visit, he/she may hold Plavix  for 5 days, and ASA for 7 days prior to procedure. Please resume Plavix  and ASA as soon as possible postprocedure, at the discretion of the surgeon.    I also confirmed the patient resides in the state of [place] . As per [facility] Medical Board telemedicine laws, the patient must reside in the state in which the provider is licensed.   Friddie Jetty, NP 07/21/2023, 1:10 PM [facility]

## 2023-07-21 NOTE — Telephone Encounter (Signed)
   Pre-operative Risk Assessment    Patient Name: Ray Brown  DOB: 1950-10-29 MRN: [id number]   Date of last office visit: 08/23/22 Marlyse Single, PA-C Date of next office visit: NONE   Request for Surgical Clearance    Procedure:  COLONOSCOPY/ ENDOSCOPY  Date of Surgery:  Clearance 08/08/23                                Surgeon:  DR Lanita Pitman Surgeon's Group or Practice Name:  [facility] Phone number:  [phone] Fax number:  [phone]   Type of Clearance Requested:   - Medical  - Pharmacy:  Hold Aspirin  and Clopidogrel  (Plavix ) 5 DAYS PRIOR & HAVE IN ENDOSCOPY   Type of Anesthesia:  PROPOFOL    Additional requests/questions:    Signed, Collin Deal   07/21/2023, 12:43 PM

## 2023-07-26 DIAGNOSIS — E119 Type 2 diabetes mellitus without complications: Secondary | ICD-10-CM | POA: Diagnosis not present

## 2023-07-26 DIAGNOSIS — H52203 Unspecified astigmatism, bilateral: Secondary | ICD-10-CM | POA: Diagnosis not present

## 2023-07-26 DIAGNOSIS — Z961 Presence of intraocular lens: Secondary | ICD-10-CM | POA: Diagnosis not present

## 2023-07-27 ENCOUNTER — Ambulatory Visit
Admission: RE | Admit: 2023-07-27 | Discharge: 2023-07-27 | Disposition: A | Discharge status: Home / Self Care | Source: Ambulatory Visit | Attending: Family Medicine | Admitting: Family Medicine

## 2023-07-27 DIAGNOSIS — H8110 Benign paroxysmal vertigo, unspecified ear: Secondary | ICD-10-CM | POA: Diagnosis not present

## 2023-07-27 MED ORDER — GADOPICLENOL 0.5 MMOL/ML IV SOLN
8.0000 mL | Freq: Once | INTRAVENOUS | Status: AC | PRN
Start: 2023-07-27 — End: 2023-07-27
  Administered 2023-07-27: 8 mL via INTRAVENOUS

## 2023-07-28 ENCOUNTER — Telehealth: Payer: Self-pay

## 2023-07-28 NOTE — Telephone Encounter (Signed)
 Med Rec and consent done     Patient Consent for Virtual Visit        Ray Brown has provided verbal consent on 07/28/2023 for a virtual visit (video or telephone).   CONSENT FOR VIRTUAL VISIT FOR:  Ray Brown  By participating in this virtual visit I agree to the following:  I hereby voluntarily request, consent and authorize [facility] and its employed or contracted physicians, physician assistants, nurse practitioners or other licensed health care professionals (the Practitioner), to provide me with telemedicine health care services (the "Services") as deemed necessary by the treating Practitioner. I acknowledge and consent to receive the Services by the Practitioner via telemedicine. I understand that the telemedicine visit will involve communicating with the Practitioner through live audiovisual communication technology and the disclosure of certain medical information by electronic transmission. I acknowledge that I have been given the opportunity to request an in-person assessment or other available alternative prior to the telemedicine visit and am voluntarily participating in the telemedicine visit.  I understand that I have the right to withhold or withdraw my consent to the use of telemedicine in the course of my care at any time, without affecting my right to future care or treatment, and that the Practitioner or I may terminate the telemedicine visit at any time. I understand that I have the right to inspect all information obtained and/or recorded in the course of the telemedicine visit and may receive copies of available information for a reasonable fee.  I understand that some of the potential risks of receiving the Services via telemedicine include:  Delay or interruption in medical evaluation due to technological equipment failure or disruption; Information transmitted may not be sufficient (e.g. poor resolution of images) to allow for appropriate medical  decision making by the Practitioner; and/or  In rare instances, security protocols could fail, causing a breach of personal health information.  Furthermore, I acknowledge that it is my responsibility to provide information about my medical history, conditions and care that is complete and accurate to the best of my ability. I acknowledge that Practitioner's advice, recommendations, and/or decision may be based on factors not within their control, such as incomplete or inaccurate data provided by me or distortions of diagnostic images or specimens that may result from electronic transmissions. I understand that the practice of medicine is not an exact science and that Practitioner makes no warranties or guarantees regarding treatment outcomes. I acknowledge that a copy of this consent can be made available to me via my patient portal [facility] MyChart), or I can request a printed copy by calling the office of [facility].    I understand that my insurance will be billed for this visit.   I have read or had this consent read to me. I understand the contents of this consent, which adequately explains the benefits and risks of the Services being provided via telemedicine.  I have been provided ample opportunity to ask questions regarding this consent and the Services and have had my questions answered to my satisfaction. I give my informed consent for the services to be provided through the use of telemedicine in my medical care

## 2023-07-28 NOTE — Telephone Encounter (Signed)
 Received 2nd clearance request:  Left message for pt to call our office back and ask for the preop team to schedule TELE Preop appt.

## 2023-07-28 NOTE — Telephone Encounter (Signed)
 S/W pt and scheduled TELE Preop Appt 08/02/23. Med Rec and consent done

## 2023-08-02 ENCOUNTER — Ambulatory Visit: Attending: Cardiovascular Disease | Admitting: Nurse Practitioner

## 2023-08-02 ENCOUNTER — Encounter: Payer: Self-pay | Admitting: Nurse Practitioner

## 2023-08-02 ENCOUNTER — Other Ambulatory Visit: Payer: Self-pay | Admitting: Physician Assistant

## 2023-08-02 DIAGNOSIS — Z01810 Encounter for preprocedural cardiovascular examination: Secondary | ICD-10-CM | POA: Diagnosis not present

## 2023-08-02 NOTE — Progress Notes (Signed)
 Virtual Visit via Telephone Note   Because of Ray Brown co-morbid illnesses, he is at least at moderate risk for complications without adequate follow up.  This format is felt to be most appropriate for this patient at this time.  Due to technical limitations with video connection (technology), today's appointment will be conducted as an audio only telehealth visit, and [name] verbally agreed to proceed in this manner.   All issues noted in this document were discussed and addressed.  No physical exam could be performed with this format.  Evaluation Performed:  Preoperative cardiovascular risk assessment _____________   Date:  08/02/2023   Patient ID:  Ray Brown, DOB Jun 04, 1950, MRN [id number] Patient Location:  Home Provider location:   Office  Primary Care Provider:  Glena Landau, MD Primary Cardiologist:  Dorothye Gathers, MD  Chief Complaint / Patient Profile   73 y.o. y/o male with a h/o CAD s/p BMS to RCA in 2008, repeat heart cath 11/2014 with patent RCA stent and no additional obstructive disease, PAD s/p right iliac stent and aorto-bifemoral bypass in 2003, diabetes, HLD, OSA,  who is pending colonoscopy/endoscopy with Dr. Dellis Fermo on 08/08/23 and presents today for telephonic preoperative cardiovascular risk assessment.  History of Present Illness    Ray Brown is a 73 y.o. male who presents via audio/video conferencing for a telehealth visit today.  Pt was last seen in cardiology clinic on 08/23/22 by Marlyse Single, PA.  At that time LUNDY COZART was doing well.  The patient is now pending procedure as outlined above. Since his last visit, he  denies chest pain, shortness of breath, lower extremity edema, fatigue, palpitations, melena, hematuria, hemoptysis, diaphoresis, weakness, presyncope, syncope, orthopnea, and PND. He is somewhat limited by back and hip pain but is able to achieve > 4 METS activity without concerning cardiac symptoms.    Past  Medical History    Past Medical History:  Diagnosis Date   Arthritis    Bronchitis    Chronic back pain    Complication of anesthesia    difficulty breathing s/p back surgery [facility]   Coronary artery disease    Diabetes mellitus without complication (HCC)    Dry skin    in the back of head-left side   GERD (gastroesophageal reflux disease)    H/O hiatal hernia    Hypercholesteremia    Peripheral vascular disease (HCC)    Pneumonia    Shortness of breath    Sleep apnea    has CPAP but does not use   Unstable angina (HCC) 12/05/2014   Past Surgical History:  Procedure Laterality Date   AORTA - BILATERAL FEMORAL ARTERY BYPASS GRAFT  11/27/2001   Dr Ena Harries Early.  Aortobifemoral bypass with a 14 x 8 Hemashield graft.   BACK SURGERY  2004   CARDIAC CATHETERIZATION  2010   x 2 stents RCA   CARDIAC CATHETERIZATION N/A 12/08/2014   Procedure: Left Heart Cath and Coronary Angiography;  Surgeon: Avanell Leigh, MD;  Location: [facility] INVASIVE CV LAB;  Service: Cardiovascular;  Laterality: N/A;   COLONOSCOPY  08/04/2009   CORONARY ANGIOPLASTY     2 stents   FOOT SURGERY  1980s   right foot   LYSIS OF ADHESION N/A 01/16/2019   Procedure: LYSIS OF ADHESIONS;  Surgeon: Candyce Champagne, MD;  Location: [facility];  Service: General;  Laterality: N/A;   PROCTOSCOPY N/A 01/16/2019   Procedure: RIGID PROCTOSCOPY;  Surgeon: Candyce Champagne,  MD;  Location: [facility];  Service: General;  Laterality: N/A;   SHOULDER ARTHROSCOPY WITH SUBACROMIAL DECOMPRESSION Right 03/28/2013   Procedure: RIGHT SHOULDER ARTHROSCOPY WITH SUBACROMIAL DECOMPRESSION AND DISTAL CLAVICLE RESECTION;  Surgeon: Glo Larch, MD;  Location: [facility];  Service: Orthopedics;  Laterality: Right;   SHOULDER SURGERY     left shoulder rotator cuff repair   UPPER GI ENDOSCOPY     XI ROBOTIC ASSISTED LOWER ANTERIOR RESECTION N/A 01/16/2019   Procedure: XI ROBOTIC RESECTION OF SIGMOID COLON;  Surgeon: Candyce Champagne, MD;  Location: [facility];   Service: General;  Laterality: N/A;    Allergies  Allergies  Allergen Reactions   Augmentin [Amoxicillin-Pot Clavulanate] Nausea And Vomiting   Lipitor  [Atorvastatin ]     Muscle pain   Ciprofloxacin Rash   Oxaprozin Rash    Other Reaction(s): Not available    Home Medications    Prior to Admission medications   Medication Sig Start Date End Date Taking? Authorizing Provider  aspirin  EC 81 MG tablet Take 81 mg by mouth at bedtime.    [provider]  clopidogrel  (PLAVIX ) 75 MG tablet Take 1 tablet (75 mg total) by mouth at bedtime. 08/23/22   Marlyse Single T, PA-C  fenofibrate  160 MG tablet Take 160 mg by mouth at bedtime.    [provider]  glucose blood (ONETOUCH ULTRA) test strip daily.    [provider]  Lancets MISC See admin instructions. 02/02/18   [provider]  meclizine (ANTIVERT) 25 MG tablet Take 25 mg by mouth as needed for dizziness or nausea. 06/23/22   [provider]  metoprolol  succinate (TOPROL -XL) 25 MG 24 hr tablet Take 1 tablet (25 mg total) by mouth daily. 08/23/22   Marlyse Single T, PA-C  Multiple Vitamins-Minerals (MULTIVITAMIN ADULT PO) Take 1 tablet by mouth daily.     [provider]  nitroGLYCERIN  (NITROSTAT ) 0.4 MG SL tablet Place 0.4 mg under the tongue every 5 (five) minutes as needed for chest pain.    [provider]  Omega-3 Fatty Acids (FISH OIL) 1000 MG CAPS 1,200 mg daily.    [provider]  ONETOUCH ULTRA test strip CHECK BLOOD SUGAR ONCE DAILY 01/20/21   [provider]  pantoprazole  (PROTONIX ) 40 MG tablet Take 40 mg by mouth at bedtime.    [provider]  polyethylene glycol (MIRALAX  / GLYCOLAX ) 17 g packet Take 17 g by mouth 2 (two) times daily. 01/01/19   Akula, Vijaya, MD  rosuvastatin (CRESTOR) 20 MG tablet Take 20 mg by mouth daily. 12/10/20   [provider]    Physical Exam    Vital Signs:  [name] does not have vital signs  available for review today.  Given telephonic nature of communication, physical exam is limited. AAOx3. NAD. Normal affect.  Speech and respirations are unlabored.  Accessory Clinical Findings    None  Assessment & Plan    1.  Preoperative Cardiovascular Risk Assessment: According to the Revised Cardiac Risk Index (RCRI), his Perioperative Risk of Major Cardiac Event is (%): 0.9. His Functional Capacity in METs is: 7.93 according to the Duke Activity Status Index (DASI). The patient is doing well from a cardiac perspective. Therefore, based on ACC/AHA guidelines, the patient would be at acceptable risk for the planned procedure without further cardiovascular testing.   The patient was advised that if he develops new symptoms prior to surgery to contact our office to arrange for a follow-up visit, and he verbalized  understanding.  Per office protocol, if patient is without any new symptoms or concerns at the time of their virtual visit, he/she may hold Plavix  for 5 days, and ASA for 7 days prior to procedure. Please resume Plavix  and ASA as soon as possible postprocedure, at the discretion of the surgeon.    A copy of this note will be routed to requesting surgeon.  Time:   Today, I have spent 10 minutes with the patient with telehealth technology discussing medical history, symptoms, and management plan.    Gerldine Koch, NP-C  08/02/2023, 2:27 PM [address] [place], [place] [address] Office [phone] Fax [phone]

## 2023-08-08 DIAGNOSIS — R1010 Upper abdominal pain, unspecified: Secondary | ICD-10-CM | POA: Diagnosis not present

## 2023-08-08 DIAGNOSIS — K635 Polyp of colon: Secondary | ICD-10-CM | POA: Diagnosis not present

## 2023-08-08 DIAGNOSIS — D122 Benign neoplasm of ascending colon: Secondary | ICD-10-CM | POA: Diagnosis not present

## 2023-08-08 DIAGNOSIS — K2930 Chronic superficial gastritis without bleeding: Secondary | ICD-10-CM | POA: Diagnosis not present

## 2023-08-08 DIAGNOSIS — Z1211 Encounter for screening for malignant neoplasm of colon: Secondary | ICD-10-CM | POA: Diagnosis not present

## 2023-08-08 DIAGNOSIS — K3189 Other diseases of stomach and duodenum: Secondary | ICD-10-CM | POA: Diagnosis not present

## 2023-08-08 DIAGNOSIS — Z980 Intestinal bypass and anastomosis status: Secondary | ICD-10-CM | POA: Diagnosis not present

## 2023-08-08 DIAGNOSIS — K5730 Diverticulosis of large intestine without perforation or abscess without bleeding: Secondary | ICD-10-CM | POA: Diagnosis not present

## 2023-08-08 DIAGNOSIS — R634 Abnormal weight loss: Secondary | ICD-10-CM | POA: Diagnosis not present

## 2023-08-08 DIAGNOSIS — D123 Benign neoplasm of transverse colon: Secondary | ICD-10-CM | POA: Diagnosis not present

## 2023-08-08 DIAGNOSIS — D124 Benign neoplasm of descending colon: Secondary | ICD-10-CM | POA: Diagnosis not present

## 2023-08-08 DIAGNOSIS — Z800 Family history of malignant neoplasm of digestive organs: Secondary | ICD-10-CM | POA: Diagnosis not present

## 2023-08-11 DIAGNOSIS — K635 Polyp of colon: Secondary | ICD-10-CM | POA: Diagnosis not present

## 2023-08-11 DIAGNOSIS — D122 Benign neoplasm of ascending colon: Secondary | ICD-10-CM | POA: Diagnosis not present

## 2023-08-11 DIAGNOSIS — D123 Benign neoplasm of transverse colon: Secondary | ICD-10-CM | POA: Diagnosis not present

## 2023-08-11 DIAGNOSIS — D124 Benign neoplasm of descending colon: Secondary | ICD-10-CM | POA: Diagnosis not present

## 2023-08-11 DIAGNOSIS — K2930 Chronic superficial gastritis without bleeding: Secondary | ICD-10-CM | POA: Diagnosis not present

## 2023-08-22 ENCOUNTER — Encounter: Payer: Self-pay | Admitting: Neurology

## 2023-09-22 DIAGNOSIS — J019 Acute sinusitis, unspecified: Secondary | ICD-10-CM | POA: Diagnosis not present

## 2023-10-24 DIAGNOSIS — K2270 Barrett's esophagus without dysplasia: Secondary | ICD-10-CM | POA: Diagnosis not present

## 2023-10-24 DIAGNOSIS — Z86018 Personal history of other benign neoplasm: Secondary | ICD-10-CM | POA: Diagnosis not present

## 2023-10-26 ENCOUNTER — Ambulatory Visit (INDEPENDENT_AMBULATORY_CARE_PROVIDER_SITE_OTHER): Admitting: Neurology

## 2023-10-26 ENCOUNTER — Encounter: Payer: Self-pay | Admitting: Neurology

## 2023-10-26 VITALS — Ht 68.0 in | Wt 172.0 lb

## 2023-10-26 DIAGNOSIS — H81399 Other peripheral vertigo, unspecified ear: Secondary | ICD-10-CM

## 2023-10-26 NOTE — Patient Instructions (Signed)
 Good to meet you! Please resume regular Vestibular therapy, we will send the referral. You can try the meclizine as needed for symptomatic treatment. Follow-up in 4 months, call for any changes

## 2023-10-26 NOTE — Progress Notes (Signed)
 NEUROLOGY CONSULTATION NOTE  Ray Brown MRN: [id number] DOB: [birth year]  Referring provider: Dr. Joen Gentry Primary care provider: Dr. Joen Gentry  Reason for consult:  vertigo  Dear Dr Gentry:  Thank you for your kind referral of Ray Brown for consultation of the above symptoms. Although his history is well known to you, please allow me to reiterate it for the purpose of our medical record. He is alone in the office today. Records and images were personally reviewed where available. Edith  HISTORY OF PRESENT ILLNESS: This is a 73 year old right-handed man with a history of hyperlipidemia, DM2, diverticulitis s/p surgical resection, presenting for evaluation of vertigo. He recalls that during his admission for diverticulitis in 2020, right before surgery, he was in the ER and started having vertigo when he lay down. It felt like he was falling, things were moving around him. It lasted a few seconds but felt long. If he looks down, things go crazy for a few seconds, sometimes he has come close to passing out. If he lays on left side at night, everything just goes crazy for 1-2 seconds. It does not occur laying on the right side. It can also occur when looking up or down for a long time. He states vertigo happens daily, sometimes he is sitting and has it, at times several times a day. His vision almost goes unfocused and feels a sensation of movement. When he lifts his head back, he waits a minute and symptoms resolve. He drives and does not get vertigo when driving. He feels nauseated constantly. No headaches, diplopia, dysarthria/dysphagia, hearing loss. There is occasional high-pitched tinnitus like a whistle or cricekt sound. No pulsatile tinnitus. No neck pain. He denies any prior head injuries. He is not sure mecllizine helps, he has not taken it that much and thinks it may help some. His wife notes when he had a sinus infection, vertigo seemed more constant and very bad. He  was so nauseated he could not talk.   He was seen by ENT in 2021, he saw the vestibular therapist twice in 2022 with exam consistent with right posterior canalithiasis. He feels is may have helped but was cost-prohibitive. I personally reviewed brain MRI with and without contrast done 07/2023 which was normal.    PAST MEDICAL HISTORY: Past Medical History:  Diagnosis Date   Arthritis    Bronchitis    Chronic back pain    Complication of anesthesia    difficulty breathing s/p back surgery [facility]   Coronary artery disease    Diabetes mellitus without complication (HCC)    Dry skin    in the back of head-left side   GERD (gastroesophageal reflux disease)    H/O hiatal hernia    Hypercholesteremia    Peripheral vascular disease (HCC)    Pneumonia    Shortness of breath    Sleep apnea    has CPAP but does not use   Unstable angina (HCC) 12/05/2014    PAST SURGICAL HISTORY: Past Surgical History:  Procedure Laterality Date   AORTA - BILATERAL FEMORAL ARTERY BYPASS GRAFT  11/27/2001   Dr Krystal Early.  Aortobifemoral bypass with a 14 x 8 Hemashield graft.   BACK SURGERY  2004   CARDIAC CATHETERIZATION  2010   x 2 stents RCA   CARDIAC CATHETERIZATION N/A 12/08/2014   Procedure: Left Heart Cath and Coronary Angiography;  Surgeon: Dorn JINNY Lesches, MD;  Location: [facility] INVASIVE CV LAB;  Service:  Cardiovascular;  Laterality: N/A;   COLONOSCOPY  08/04/2009   CORONARY ANGIOPLASTY     2 stents   FOOT SURGERY  1980s   right foot   LYSIS OF ADHESION N/A 01/16/2019   Procedure: LYSIS OF ADHESIONS;  Surgeon: Sheldon Standing, MD;  Location: [facility];  Service: General;  Laterality: N/A;   PROCTOSCOPY N/A 01/16/2019   Procedure: RIGID PROCTOSCOPY;  Surgeon: Sheldon Standing, MD;  Location: [facility];  Service: General;  Laterality: N/A;   SHOULDER ARTHROSCOPY WITH SUBACROMIAL DECOMPRESSION Right 03/28/2013   Procedure: RIGHT SHOULDER ARTHROSCOPY WITH SUBACROMIAL DECOMPRESSION AND DISTAL CLAVICLE  RESECTION;  Surgeon: Franky CHRISTELLA Pointer, MD;  Location: [facility];  Service: Orthopedics;  Laterality: Right;   SHOULDER SURGERY     left shoulder rotator cuff repair   UPPER GI ENDOSCOPY     XI ROBOTIC ASSISTED LOWER ANTERIOR RESECTION N/A 01/16/2019   Procedure: XI ROBOTIC RESECTION OF SIGMOID COLON;  Surgeon: Sheldon Standing, MD;  Location: [facility];  Service: General;  Laterality: N/A;    MEDICATIONS: Current Outpatient Medications on File Prior to Visit  Medication Sig Dispense Refill   aspirin  EC 81 MG tablet Take 81 mg by mouth at bedtime.     clopidogrel  (PLAVIX ) 75 MG tablet TAKE 1 TABLET BY MOUTH AT BEDTIME. 90 tablet 0   fenofibrate  160 MG tablet Take 160 mg by mouth at bedtime.     glucose blood (ONETOUCH ULTRA) test strip daily.     Lancets MISC See admin instructions.     meclizine (ANTIVERT) 25 MG tablet Take 25 mg by mouth as needed for dizziness or nausea.     metoprolol  succinate (TOPROL -XL) 25 MG 24 hr tablet TAKE 1 TABLET (25 MG TOTAL) BY MOUTH DAILY. 90 tablet 0   Multiple Vitamins-Minerals (MULTIVITAMIN ADULT PO) Take 1 tablet by mouth daily.      nitroGLYCERIN  (NITROSTAT ) 0.4 MG SL tablet Place 0.4 mg under the tongue every 5 (five) minutes as needed for chest pain.     Omega-3 Fatty Acids (FISH OIL) 1000 MG CAPS 1,200 mg daily.     ondansetron  (ZOFRAN ) 4 MG tablet Take 4-8 mg by mouth 3 (three) times daily as needed.     ONETOUCH ULTRA test strip CHECK BLOOD SUGAR ONCE DAILY     pantoprazole  (PROTONIX ) 40 MG tablet Take 40 mg by mouth at bedtime.     rosuvastatin (CRESTOR) 20 MG tablet Take 20 mg by mouth daily.     polyethylene glycol (MIRALAX  / GLYCOLAX ) 17 g packet Take 17 g by mouth 2 (two) times daily. (Patient not taking: Reported on 10/26/2023) 14 each 0   No current facility-administered medications on file prior to visit.    ALLERGIES: Allergies  Allergen Reactions   Augmentin [Amoxicillin-Pot Clavulanate] Nausea And Vomiting   Glipizide Other (See Comments)    Lipitor  [Atorvastatin ]     Muscle pain   Metformin Nausea And Vomiting   Sulfa Antibiotics Dermatitis   Ciprofloxacin Rash   Oxaprozin Rash    Other Reaction(s): Not available    FAMILY HISTORY: Family History  Problem Relation Age of Onset   Colon cancer Mother    Heart attack Father        , massive   Coronary artery disease Brother    Thyroid cancer Brother     SOCIAL HISTORY: Social History   Socioeconomic History   Marital status: Married    Spouse name: Not on file   Number of children: Not on file  Years of education: Not on file   Highest education level: Not on file  Occupational History   Occupation: retired  Tobacco Use   Smoking status: Former    Types: Cigarettes   Smokeless tobacco: Never  Vaping Use   Vaping status: Never Used  Substance and Sexual Activity   Alcohol use: No   Drug use: No   Sexual activity: Not on file  Other Topics Concern   Not on file  Social History Narrative   Living w/ wife, 1 story home 7 steps   Right hand    Coffee 2 cups a day   Social Drivers of Corporate investment banker Strain: Not on file  Food Insecurity: Not on file  Transportation Needs: Not on file  Physical Activity: Not on file  Stress: Not on file  Social Connections: Not on file  Intimate Partner Violence: Not on file     PHYSICAL EXAM: Vitals:   10/26/23 1144  SpO2: 96%   Orthostatic vital signs: Supine BP 141/76, HR 96 Sitting BP 125/72, HR 91 Standing BP 146/78, HR 95 General: No acute distress Head:  Normocephalic/atraumatic Skin/Extremities: No rash, no edema Neurological Exam: Mental status: alert and awake, no dysarthria or aphasia, Fund of knowledge is appropriate.   Attention and concentration are normal.  Cranial nerves: CN I: not tested CN II: pupils equal, round, visual fields intact CN III, IV, VI:  full range of motion, no nystagmus, no ptosis CN V: facial sensation intact CN VII: upper and lower face symmetric CN  VIII: hearing intact to conversation CN XI: sternocleidomastoid and trapezius muscles intact CN XII: tongue midline Bulk & Tone: normal, no fasciculations. Motor: 5/5 throughout with no pronator drift. Sensation: intact to light touch, cold, pin, vibration sense.  No extinction to double simultaneous stimulation.  Romberg test negative Deep Tendon Reflexes: +1 throughout Cerebellar: no incoordination on finger to nose, heel to shin testing, normal RAMs Gait: narrow-based and steady, able to tandem walk adequately. Tremor: none   IMPRESSION: This is a 73 year old right-handed man with a history of hyperlipidemia, DM2, diverticulitis s/p surgical resection, presenting for evaluation of positional vertigo. Symptoms have been ongoing since 2020, triggered by positional changes. Vestibular therapy in 2022 showed findings consistent with right posterior canalithiasis. MRI brain normal. I discussed the diagnosis of peripheral vertigo/positional vertigo with them and recommended resuming Vestibular therapy. He can use prn meclizine for symptomatic treatment. Follow-up in 4 months, call for any changes.    Thank you for allowing me to participate in the care of this patient. Please do not hesitate to call for any questions or concerns.   Darice Shivers, M.D.  CC: Dr. Loreli

## 2023-10-27 ENCOUNTER — Ambulatory Visit: Admitting: Neurology

## 2023-10-30 ENCOUNTER — Other Ambulatory Visit: Payer: Self-pay

## 2023-11-01 MED ORDER — METOPROLOL SUCCINATE ER 25 MG PO TB24: 25.0000 mg | ORAL_TABLET | Freq: Every day | ORAL | 0 refills | Status: DC

## 2023-11-01 MED ORDER — CLOPIDOGREL BISULFATE 75 MG PO TABS: 75.0000 mg | ORAL_TABLET | Freq: Every day | ORAL | 0 refills | Status: DC

## 2023-11-07 ENCOUNTER — Ambulatory Visit: Attending: Neurology | Admitting: Physical Therapy

## 2023-11-07 DIAGNOSIS — R42 Dizziness and giddiness: Secondary | ICD-10-CM | POA: Insufficient documentation

## 2023-11-07 DIAGNOSIS — H8111 Benign paroxysmal vertigo, right ear: Secondary | ICD-10-CM | POA: Insufficient documentation

## 2023-11-07 DIAGNOSIS — H81399 Other peripheral vertigo, unspecified ear: Secondary | ICD-10-CM | POA: Insufficient documentation

## 2023-11-07 NOTE — Patient Instructions (Signed)
 Self Treatment for Right Posterior / Anterior Canalithiasis    Sitting on bed: 1. Turn head 45 right. (a) Lie back slowly, shoulders on pillow, head on bed. (b) Hold __20__ seconds. 2. Keeping head on bed, turn head 90 left. Hold __20__ seconds. 3. Roll to left, head on 45 angle down toward bed. Hold _20___ seconds. 4. Sit up on left side of bed. Repeat __3__ times per session. Do _2___ sessions per day.    How to Perform the Epley Maneuver The Epley maneuver is an exercise that relieves symptoms of vertigo. Vertigo is the feeling that you or your surroundings are moving when they are not. When you feel vertigo, you may feel like the room is spinning and may have trouble walking. The Epley maneuver is used for a type of vertigo caused by a calcium  deposit in a part of the inner ear. The maneuver involves changing head positions to help the deposit move out of the area. You can do this maneuver at home whenever you have symptoms of vertigo. You can repeat it in 24 hours if your vertigo has not gone away. Even though the Epley maneuver may relieve your vertigo for a few weeks, it is possible that your symptoms will return. This maneuver relieves vertigo, but it does not relieve dizziness. What are the risks? If it is done correctly, the Epley maneuver is considered safe. Sometimes it can lead to dizziness or nausea that goes away after a short time. If you develop other symptoms--such as changes in vision, weakness, or numbness--stop doing the maneuver and call your health care provider. Supplies needed: A bed or table. A pillow. How to do the Epley maneuver     Sit on the edge of a bed or table with your back straight and your legs extended or hanging over the edge of the bed or table. Turn your head halfway toward the affected ear or side as told by your health care provider. Lie backward quickly with your head turned until you are lying flat on your back. Your head should dangle  (head-hanging position). You may want to position a pillow under your shoulders. Hold this position for at least 30 seconds. If you feel dizzy or have symptoms of vertigo, continue to hold the position until the symptoms stop. Turn your head to the opposite direction until your unaffected ear is facing down. Your head should continue to dangle. Hold this position for at least 30 seconds. If you feel dizzy or have symptoms of vertigo, continue to hold the position until the symptoms stop. Turn your whole body to the same side as your head so that you are positioned on your side. Your head will now be nearly facedown and no longer needs to dangle. Hold for at least 30 seconds. If you feel dizzy or have symptoms of vertigo, continue to hold the position until the symptoms stop. Sit back up. You can repeat the maneuver in 24 hours if your vertigo does not go away. Follow these instructions at home: For 24 hours after doing the Epley maneuver: Keep your head in an upright position. When lying down to sleep or rest, keep your head raised (elevated) with two or more pillows. Avoid excessive neck movements. Activity Do not drive or use machinery if you feel dizzy. After doing the Epley maneuver, return to your normal activities as told by your health care provider. Ask your health care provider what activities are safe for you. General instructions Drink enough fluid  to keep your urine pale yellow. Do not drink alcohol. Take over-the-counter and prescription medicines only as told by your health care provider. Keep all follow-up visits. This is important. Preventing vertigo symptoms Ask your health care provider if there is anything you should do at home to prevent vertigo. He or she may recommend that you: Keep your head elevated with two or more pillows while you sleep. Do not sleep on the side of your affected ear. Get up slowly from bed. Avoid sudden movements during the day. Avoid extreme head  positions or movement, such as looking up or bending over. Contact a health care provider if: Your vertigo gets worse. You have other symptoms, including: Nausea. Vomiting. Headache. Get help right away if you: Have vision changes. Have a headache or neck pain that is severe or getting worse. Cannot stop vomiting. Have new numbness or weakness in any part of your body. These symptoms may represent a serious problem that is an emergency. Do not wait to see if the symptoms will go away. Get medical help right away. Call your local emergency services (911 in the [place] Do not drive yourself to the hospital. Summary Vertigo is the feeling that you or your surroundings are moving when they are not. The Epley maneuver is an exercise that relieves symptoms of vertigo. If the Epley maneuver is done correctly, it is considered safe. This information is not intended to replace advice given to you by your health care provider. Make sure you discuss any questions you have with your health care provider. Document Revised: 11/04/2022 Document Reviewed: 11/04/2022 Elsevier Patient Education  2024 [employer].

## 2023-11-07 NOTE — Therapy (Unsigned)
 OUTPATIENT PHYSICAL THERAPY VESTIBULAR EVALUATION     Patient Name: Ray Brown MRN: [id number] DOB:[birth year], 73 y.o., male Today's Date: 11/08/2023  END OF SESSION:  PT End of Session - 11/08/23 0801     Visit Number 1    Number of Visits 4    Date for PT Re-Evaluation 12/01/23    Authorization Type [employer] Medicare    Authorization Time Period 11-07-23 - 01-07-24    PT Start Time 0930    PT Stop Time 1014    PT Time Calculation (min) 44 min    Activity Tolerance Patient tolerated treatment well    Behavior During Therapy [facility] for tasks assessed/performed          Past Medical History:  Diagnosis Date   Arthritis    Bronchitis    Chronic back pain    Complication of anesthesia    difficulty breathing s/p back surgery [facility]   Coronary artery disease    Diabetes mellitus without complication (HCC)    Dry skin    in the back of head-left side   GERD (gastroesophageal reflux disease)    H/O hiatal hernia    Hypercholesteremia    Peripheral vascular disease (HCC)    Pneumonia    Shortness of breath    Sleep apnea    has CPAP but does not use   Unstable angina (HCC) 12/05/2014   Past Surgical History:  Procedure Laterality Date   AORTA - BILATERAL FEMORAL ARTERY BYPASS GRAFT  11/27/2001   Dr Krystal Early.  Aortobifemoral bypass with a 14 x 8 Hemashield graft.   BACK SURGERY  2004   CARDIAC CATHETERIZATION  2010   x 2 stents RCA   CARDIAC CATHETERIZATION N/A 12/08/2014   Procedure: Left Heart Cath and Coronary Angiography;  Surgeon: Dorn JINNY Lesches, MD;  Location: [facility] INVASIVE CV LAB;  Service: Cardiovascular;  Laterality: N/A;   COLONOSCOPY  08/04/2009   CORONARY ANGIOPLASTY     2 stents   FOOT SURGERY  1980s   right foot   LYSIS OF ADHESION N/A 01/16/2019   Procedure: LYSIS OF ADHESIONS;  Surgeon: Sheldon Standing, MD;  Location: [facility];  Service: General;  Laterality: N/A;   PROCTOSCOPY N/A 01/16/2019   Procedure: RIGID PROCTOSCOPY;  Surgeon: Sheldon Standing, MD;  Location: [facility];  Service: General;  Laterality: N/A;   SHOULDER ARTHROSCOPY WITH SUBACROMIAL DECOMPRESSION Right 03/28/2013   Procedure: RIGHT SHOULDER ARTHROSCOPY WITH SUBACROMIAL DECOMPRESSION AND DISTAL CLAVICLE RESECTION;  Surgeon: Franky CHRISTELLA Pointer, MD;  Location: [facility] OR;  Service: Orthopedics;  Laterality: Right;   SHOULDER SURGERY     left shoulder rotator cuff repair   UPPER GI ENDOSCOPY     XI ROBOTIC ASSISTED LOWER ANTERIOR RESECTION N/A 01/16/2019   Procedure: XI ROBOTIC RESECTION OF SIGMOID COLON;  Surgeon: Sheldon Standing, MD;  Location: [facility];  Service: General;  Laterality: N/A;   Patient Active Problem List   Diagnosis Date Noted   Essential hypertension 08/23/2022   Pure hypercholesterolemia 08/23/2022   Diverticulitis with abscess s/p robotic LAR rectosigmoid resection 01/16/2019 01/16/2019   Hypomagnesemia 12/29/2018   Protein-calorie malnutrition, moderate (HCC) 12/29/2018   Chronic anticoagulation 12/28/2018   Coronary stent 2008  restenosis due to progression of disease s/p restent 2010 12/28/2018   Status post aortobifemoral bypass surgery 2013 12/28/2018   Left-sided low back pain with left-sided sciatica 12/28/2018   Nausea & vomiting 12/28/2018   Hypokalemia 12/28/2018   Chronic back pain    BPPV (  benign paroxysmal positional vertigo) 11/10/2018   Abscess of sigmoid colon due to diverticulitis 11/03/2018   Dyslipidemia 11/03/2018   OSA (obstructive sleep apnea) 11/03/2018   Pain of right shoulder joint on movement 06/20/2018   Dyspnea 12/05/2014   Dizziness 12/05/2014   Diabetes mellitus with peripheral artery disease (HCC) 10/04/2013   Coronary artery disease involving native coronary artery of native heart without angina pectoris 10/04/2013   Impingement syndrome of right shoulder 03/28/2013   Obesity 01/21/2013   Peripheral vascular disease (HCC)     PCP: Loreli Kins, MD REFERRING PROVIDER: Georjean Darice HERO, MD  REFERRING DIAG:   Diagnosis  H81.399 (ICD-10-CM) - Peripheral vertigo, unspecified laterality    THERAPY DIAG:  BPPV (benign paroxysmal positional vertigo), right  Dizziness and giddiness  ONSET DATE: Referral date 10-26-23  Rationale for Evaluation and Treatment: Rehabilitation  SUBJECTIVE:   SUBJECTIVE STATEMENT: Pt reports 5 yrs ago he had onset of vertigo - it happened when he was having preparation for MRI for colon issue - had diverticulitis; had surgery in 2020 - removed 12 of colon - saw Dr. Georjean who diagnosed it as peripheral vertigo; says he gets dizzy when he lies down at night and turns over - unsure of which side it is; looking down - with both eye and head movement will provoke it; bending over will make him dizzy. Reports he was a little nauseated when he got up this morning. Pt reports he has meclizine but tries not to take it; also has medication for the nausea Pt accompanied by: self  PERTINENT HISTORY: history of hyperlipidemia, DM2, diverticulitis s/p surgical resection, PVD  PAIN: No pain related to dizziness- no headaches or cervical pain; will not address back pain as out of scope of this referral but will monitor  Are you having pain? No and Yes: NPRS scale: 7-8/10  Pain location: low back radiating down Rt leg  Pain description: soreness, nagging Aggravating factors: weight bearing  - standing/walking Relieving factors: sitting or lying down  PRECAUTIONS: None  RED FLAGS: None   WEIGHT BEARING RESTRICTIONS: No  FALLS: Has patient fallen in last 6 months? No  LIVING ENVIRONMENT: Lives with: lives with their spouse Lives in: House/apartment Stairs: No Has following equipment at home: None  PLOF: Independent  PATIENT GOALS: resolve the dizziness  OBJECTIVE:  Note: Objective measures were completed at Evaluation unless otherwise noted.  DIAGNOSTIC FINDINGS: IMPRESSION: 07-27-23 MRI 1. Normal MRI of the brain. Resolution of previously seen dural thickening. 2.  Moderate bilateral maxillary sinus disease with retention cysts.  COGNITION: Overall cognitive status: Within functional limits for tasks assessed   GAIT: Gait pattern: [facility] Distance walked: 12' Assistive device utilized: None Level of assistance: Complete Independence Comments: no unsteadiness noted at start of eval session  VESTIBULAR ASSESSMENT:  GENERAL OBSERVATION: pt is 73 yr old gentleman amb. Independently without device; reports he has had chronic vertigo since 2020   SYMPTOM BEHAVIOR:  Subjective history: pt reports he has had vertigo since 2020 when he was getting ready to have a MRI done due to diverticulitis  Non-Vestibular symptoms: N/A  Type of dizziness: Spinning/Vertigo  Frequency: varies - depends on movement  Duration: secs to minutes  Aggravating factors: Induced by position change: lying supine, rolling to the right, rolling to the left, and supine to sit  Relieving factors: head stationary  Progression of symptoms: unchanged  POSITIONAL TESTING: Right Dix-Hallpike: upbeating, right nystagmus Left Dix-Hallpike: no nystagmus Right Sidelying: no nystagmus Left Sidelying: no nystagmus  MOTION SENSITIVITY:  Motion Sensitivity Quotient Intensity: 0 = none, 1 = Lightheaded, 2 = Mild, 3 = Moderate, 4 = Severe, 5 = Vomiting  Intensity  1. Sitting to supine   2. Supine to L side   3. Supine to R side   4. Supine to sitting   5. L Hallpike-Dix   6. Up from L    7. R Hallpike-Dix   8. Up from R    9. Sitting, head tipped to L knee   10. Head up from L knee   11. Sitting, head tipped to R knee   12. Head up from R knee   13. Sitting head turns x5   14.Sitting head nods x5   15. In stance, 180 turn to L    16. In stance, 180 turn to R                                                                                                                                 TREATMENT DATE: 11-07-23   Canalith Repositioning:  Epley Right: Number of Reps: 2,  Response to Treatment: symptoms improved, and Comment: no nystagmus noted on 2nd rep but pt did report min. Dizziness in 3rd position of maneuver; pt requested to stop treatment after 2nd rep and not perform 3rd rep due to not feeling real well - no nausea reported, just c/o not feeling too well  PATIENT EDUCATION: Education details: BPPV etiology - article from [employer] given to pt; also given instructions for Epley maneuver - written & diagram from [facility]  Person educated: Patient Education method: Explanation, Demonstration, and Handouts Education comprehension: verbalized understanding  HOME EXERCISE PROGRAM:  to be established as appropriate  GOALS: Goals reviewed with patient? Yes  SHORT TERM GOALS: same as LTG's as ELOS = 4 weeks   LONG TERM GOALS: Target date: 12-01-23  Pt will have (-) Rt Dix-Hallpike test with no nystagmus and no c/o vertigo in test position to indicate resolution of Rt BPPV. Baseline:  Goal status: INITIAL  2.  Pt will report no vertigo with any bed mobility or bending over to demo resolution of Rt BPPV and to increase safety with mobility. Baseline:  Goal status: INITIAL  3.  Pt will verbalize understanding of Epley maneuver and/or Brandt-Daroff exercises for self treatment of BPPV should he have re-occurrence of vertiginous episode in the future.  Baseline:  Goal status: INITIAL  4.  Independent in HEP for habituation and vestibular exercises as appropriate.  Baseline:  Goal status: INITIAL   CLINICAL IMPRESSION: Patient is a 73 y.o. gentleman who was seen today for physical therapy evaluation and treatment for Rt BPPV posterior canalithiasis.  Pt had (+) Rt Dix-Hallpike test with Rt rotary upbeating nystagmus, indicative of Rt BPPV.  Pt was treated with 2 reps of Epley maneuver and symptoms significantly improved on 2nd rep.  Treatment was discontinued after 2nd rep due to pt's c/o not feeling  well.  Pt will benefit from PT to continue to treat Rt BPPV and  address any residual balance and gait deficits.    OBJECTIVE IMPAIRMENTS: decreased balance, difficulty walking, and dizziness.   ACTIVITY LIMITATIONS: bending, standing, bed mobility, and locomotion level  PARTICIPATION LIMITATIONS: cleaning, laundry, community activity, and yard work  PERSONAL FACTORS: Past/current experiences, Time since onset of injury/illness/exacerbation, and 1-2 comorbidities: h/o chronic vertigo and diverticulitis are also affecting patient's functional outcome.   REHAB POTENTIAL: Excellent  CLINICAL DECISION MAKING: Evolving/moderate complexity  EVALUATION COMPLEXITY: Moderate   PLAN:  PT FREQUENCY: 1x/week  PT DURATION: 4 weeks  PLANNED INTERVENTIONS: 97110-Therapeutic exercises, 97530- Therapeutic activity, [id number]- Neuromuscular re-education, [phone]- Self Care, [id number]- Gait training, [phone]- Canalith repositioning, and Patient/Family education  PLAN FOR NEXT SESSION: recheck Rt BPPV - treat prn   Caydyn Sprung, [place], PT 11/08/2023, 8:04 AM

## 2023-11-08 ENCOUNTER — Encounter: Payer: Self-pay | Admitting: Physical Therapy

## 2023-11-20 ENCOUNTER — Ambulatory Visit: Payer: Self-pay | Admitting: Physical Therapy

## 2023-11-20 ENCOUNTER — Encounter: Payer: Self-pay | Admitting: Physical Therapy

## 2023-11-20 DIAGNOSIS — H81399 Other peripheral vertigo, unspecified ear: Secondary | ICD-10-CM | POA: Diagnosis not present

## 2023-11-20 DIAGNOSIS — H8111 Benign paroxysmal vertigo, right ear: Secondary | ICD-10-CM | POA: Diagnosis not present

## 2023-11-20 DIAGNOSIS — R42 Dizziness and giddiness: Secondary | ICD-10-CM | POA: Diagnosis not present

## 2023-11-20 NOTE — Patient Instructions (Signed)
 Sit to Side-Lying    Sit on edge of bed. 1. Turn head 45 to right. 2. Maintain head position and lie down slowly on left side. Hold until symptoms subside. 3. Sit up slowly. Hold until symptoms subside. 4. Turn head 45 to left. 5. Maintain head position and lie down slowly on right side. Hold until symptoms subside. 6. Sit up slowly. Repeat sequence __5__ tmes per session. Do __2-3__ sessions per day.  Copyright  VHI. All rights reserved.

## 2023-11-20 NOTE — Therapy (Addendum)
 OUTPATIENT PHYSICAL THERAPY VESTIBULAR TREATMENT     Patient Name: Ray Brown MRN: [id number] DOB:01/05/1951, 73 y.o., male Today's Date: 11/20/2023  END OF SESSION:  PT End of Session - 11/20/23 0850     Visit Number 2    Number of Visits 4    Date for Recertification  12/01/23    Authorization Type UHC Medicare    Authorization Time Period 11-07-23 - 01-07-24    PT Start Time 0848    PT Stop Time 0930    PT Time Calculation (min) 42 min    Activity Tolerance Patient tolerated treatment well    Behavior During Therapy [facility] for tasks assessed/performed          Past Medical History:  Diagnosis Date   Arthritis    Bronchitis    Chronic back pain    Complication of anesthesia    difficulty breathing s/p [facility]   Coronary artery disease    Diabetes mellitus without complication (HCC)    Dry skin    in the back of head-left side   GERD (gastroesophageal reflux disease)    H/O hiatal hernia    Hypercholesteremia    Peripheral vascular disease    Pneumonia    Shortness of breath    Sleep apnea    has CPAP but does not use   Unstable angina (HCC) 12/05/2014   Past Surgical History:  Procedure Laterality Date   AORTA - BILATERAL FEMORAL ARTERY BYPASS GRAFT  11/27/2001   Dr Ray Brown.  Aortobifemoral bypass with a 14 x 8 Hemashield graft.   BACK SURGERY  2004   CARDIAC CATHETERIZATION  2010   x 2 stents RCA   CARDIAC CATHETERIZATION N/A 12/08/2014   Procedure: Left Heart Cath and Coronary Angiography;  Surgeon: Ray JINNY Lesches, MD;  Location: [facility] INVASIVE CV LAB;  Service: Cardiovascular;  Laterality: N/A;   COLONOSCOPY  08/04/2009   CORONARY ANGIOPLASTY     2 stents   FOOT SURGERY  1980s   right foot   LYSIS OF ADHESION N/A 01/16/2019   Procedure: LYSIS OF ADHESIONS;  Surgeon: Ray Standing, MD;  Location: [facility];  Service: General;  Laterality: N/A;   PROCTOSCOPY N/A 01/16/2019   Procedure: RIGID PROCTOSCOPY;  Surgeon: Ray Standing, MD;   Location: [facility];  Service: General;  Laterality: N/A;   SHOULDER ARTHROSCOPY WITH SUBACROMIAL DECOMPRESSION Right 03/28/2013   Procedure: RIGHT SHOULDER ARTHROSCOPY WITH SUBACROMIAL DECOMPRESSION AND DISTAL CLAVICLE RESECTION;  Surgeon: Ray CHRISTELLA Pointer, MD;  Location: [facility] OR;  Service: Orthopedics;  Laterality: Right;   SHOULDER SURGERY     left shoulder rotator cuff repair   UPPER GI ENDOSCOPY     XI ROBOTIC ASSISTED LOWER ANTERIOR RESECTION N/A 01/16/2019   Procedure: XI ROBOTIC RESECTION OF SIGMOID COLON;  Surgeon: Ray Standing, MD;  Location: [facility];  Service: General;  Laterality: N/A;   Patient Active Problem List   Diagnosis Date Noted   Essential hypertension 08/23/2022   Pure hypercholesterolemia 08/23/2022   Diverticulitis with abscess s/p robotic LAR rectosigmoid resection 01/16/2019 01/16/2019   Hypomagnesemia 12/29/2018   Protein-calorie malnutrition, moderate 12/29/2018   Chronic anticoagulation 12/28/2018   Coronary stent 2008  restenosis due to progression of disease s/p restent 2010 12/28/2018   Status post aortobifemoral bypass surgery 2013 12/28/2018   Left-sided low back pain with left-sided sciatica 12/28/2018   Nausea & vomiting 12/28/2018   Hypokalemia 12/28/2018   Chronic back pain    BPPV (benign paroxysmal  positional vertigo) 11/10/2018   Abscess of sigmoid colon due to diverticulitis 11/03/2018   Dyslipidemia 11/03/2018   OSA (obstructive sleep apnea) 11/03/2018   Pain of right shoulder joint on movement 06/20/2018   Dyspnea 12/05/2014   Dizziness 12/05/2014   Diabetes mellitus with peripheral artery disease (HCC) 10/04/2013   Coronary artery disease involving native coronary artery of native heart without angina pectoris 10/04/2013   Impingement syndrome of right shoulder 03/28/2013   Obesity 01/21/2013   Peripheral vascular disease     PCP: Ray Kins, MD REFERRING PROVIDER: Georjean Darice HERO, MD  REFERRING DIAG:  Diagnosis  H81.399 (ICD-10-CM)  - Peripheral vertigo, unspecified laterality    THERAPY DIAG:  BPPV (benign paroxysmal positional vertigo), right  Dizziness and giddiness  ONSET DATE: Referral date 10-26-23  Rationale for Evaluation and Treatment: Rehabilitation  SUBJECTIVE:   SUBJECTIVE STATEMENT: Pt reports no major change in dizziness, however, his wife reports she thinks the nausea is much improved - pt does agree that he feels the nausea is 99% bettter;  pt reports he still gets dizzy when he looks down and then loses his balance when he looks back up - falls backward  Pt accompanied by: wife, Ray Brown  PERTINENT HISTORY: history of hyperlipidemia, DM2, diverticulitis s/p surgical resection, PVD  PAIN: No pain related to dizziness- no headaches or cervical pain; will not address back pain as out of scope of this referral but will monitor  Intermittent occurrence - varies in intensity  Are you having pain? No and Yes: NPRS scale: 5/10  Pain location: low back radiating down Rt leg  Pain description: soreness, nagging Aggravating factors: weight bearing  - Brown/walking Relieving factors: sitting or lying down  PRECAUTIONS: None  RED FLAGS: None   WEIGHT BEARING RESTRICTIONS: No  FALLS: Has patient fallen in last 6 months? No  LIVING ENVIRONMENT: Lives with: lives with their spouse Lives in: House/apartment Stairs: No Has following equipment at home: None  PLOF: Independent  PATIENT GOALS: resolve the dizziness  OBJECTIVE:  Note: Objective measures were completed at Evaluation unless otherwise noted.  DIAGNOSTIC FINDINGS: IMPRESSION: 07-27-23 MRI 1. Normal MRI of the brain. Resolution of previously seen dural thickening. 2. Moderate bilateral maxillary sinus disease with retention cysts.  COGNITION: Overall cognitive status: Within functional limits for tasks assessed   GAIT: Gait pattern: [facility] Distance walked: 57' Assistive device utilized: None Level of assistance: Complete  Independence Comments: no unsteadiness noted at start of eval session  VESTIBULAR ASSESSMENT:  GENERAL OBSERVATION: pt is 73 yr old gentleman amb. Independently without device; reports he has had chronic vertigo since 2020   SYMPTOM BEHAVIOR:  Subjective history: pt reports he has had vertigo since 2020 when he was getting ready to have a MRI done due to diverticulitis  Non-Vestibular symptoms: N/A  Type of dizziness: Spinning/Vertigo  Frequency: varies - depends on movement  Duration: secs to minutes  Aggravating factors: Induced by position change: lying supine, rolling to the right, rolling to the left, and supine to sit  Relieving factors: head stationary  Progression of symptoms: unchanged  POSITIONAL TESTING: Right Dix-Hallpike: upbeating, right nystagmus Left Dix-Hallpike: no nystagmus Right Sidelying: no nystagmus Left Sidelying: no nystagmus  MOTION SENSITIVITY:  Motion Sensitivity Quotient Intensity: 0 = none, 1 = Lightheaded, 2 = Mild, 3 = Moderate, 4 = Severe, 5 = Vomiting  Intensity  1. Sitting to supine   2. Supine to L side   3. Supine to R side   4. Supine to sitting  5. L Hallpike-Dix   6. Up from L    7. R Hallpike-Dix   8. Up from R    9. Sitting, head tipped to L knee   10. Head up from L knee   11. Sitting, head tipped to R knee   12. Head up from R knee   13. Sitting head turns x5   14.Sitting head nods x5   15. In stance, 180 turn to L    16. In stance, 180 turn to R                                                                                                                                 TREATMENT DATE: 11-20-23  NeuroRe-ed: Rt Dix-Hallpike test (+) for c/o dizziness (short duration) - very low intensity nystagmus - 2-3 beats only;  continued with Epley maneuver from this test position   Canalith Repositioning:  Epley Right: Number of Reps: 4, Response to Treatment: symptoms improved, and Comment: 1st rep - dizzines in 1st positon2nd  position - dizziness with looking down to floor - very short duration, approx. 2-3 secs; 3rd rep - no report of dizziness with return to sitting and no LOB posteriorly; 4th rep of Epley was performed later in treatment session due to c/o dizziness with Rt sidelying to sitting position; no c/o dizziness in any position and no LOB with Lt sidelying to sitting at end of maneuver  NeuroRe-ed: Habituation exercises - pt performed looking up at ceiling in seated position  - no vertigo provoked; looked down at floor in seated position - no major postural instability noted with return of upright head position; progressed to Brown position with pt bracing legs against mat table - looked down at floor for approx. 4-5 secs - looked up and had LOB posteriorly, using mat table to assist with balance recovery  Sit to Rt sidelying position - 1st rep - pt reported moderate dizziness with sidelying to sitting; 2nd rep - less dizziness reported with Rt sidelying to sitting; 3rd rep - minimal dizziness with Rt sidelying to sitting position (3rd rep least symptoms provoked)   Sit to Lt sidelying - no dizziness in Lt sidelying, but pt did report some dizziness with return to upright, but not as much as with Rt sidelying to sitting  Self Care; educated pt in habituation exercise of sitting <> Rt sidelying; issued this for HEP    Sit to Side-Lying    Sit on edge of bed. 1. Turn head 45 to right. 2. Maintain head position and lie down slowly on left side. Hold until symptoms subside. 3. Sit up slowly. Hold until symptoms subside. 4. Turn head 45 to left. 5. Maintain head position and lie down slowly on right side. Hold until symptoms subside. 6. Sit up slowly. Repeat sequence __5__ tmes per session. Do __2-3__ sessions per day.     PATIENT EDUCATION: Education details: habituation exercise - sit  to Rt sidelying Person educated: Patient Education method: Explanation, Demonstration, and Handouts Education  comprehension: verbalized understanding  HOME EXERCISE PROGRAM:  Habituation exercise  GOALS: Goals reviewed with patient? Yes  SHORT TERM GOALS: same as LTG's as ELOS = 4 weeks   LONG TERM GOALS: Target date: 12-01-23  Pt will have (-) Rt Dix-Hallpike test with no nystagmus and no c/o vertigo in test position to indicate resolution of Rt BPPV. Baseline:  Goal status: INITIAL  2.  Pt will report no vertigo with any bed mobility or bending over to demo resolution of Rt BPPV and to increase safety with mobility. Baseline:  Goal status: INITIAL  3.  Pt will verbalize understanding of Epley maneuver and/or Brandt-Daroff exercises for self treatment of BPPV should he have re-occurrence of vertiginous episode in the future.  Baseline:  Goal status: INITIAL  4.  Independent in HEP for habituation and vestibular exercises as appropriate.  Baseline:  Goal status: INITIAL   CLINICAL IMPRESSION: PT session focused on further assessment and treatment of Rt BPPV posterior canalithiasis.  Pt had mild low intensity Rt rotary nystagmus of only 2-3 beats in initial position of Rt Epley (Rt Dix-Hallpike test); Epley was performed 4 reps total and pt reported no dizziness in any position of 4th rep.  Pt does continue to c/o dizziness with looking down and then has posterior LOB with looking up with moderate postural instability.  Symptoms improved with habituation exercise of sit <>Rt sidelying exercise.  Pt reports significant improvement in nausea since treatment during initial eval on 11-07-23, and reported no nausea provoked during today's session.  Cont with POC.    OBJECTIVE IMPAIRMENTS: decreased balance, difficulty walking, and dizziness.   ACTIVITY LIMITATIONS: bending, Brown, bed mobility, and locomotion level  PARTICIPATION LIMITATIONS: cleaning, laundry, community activity, and yard work  PERSONAL FACTORS: Past/current experiences, Time since onset of injury/illness/exacerbation, and  1-2 comorbidities: h/o chronic vertigo and diverticulitis are also affecting patient's functional outcome.   REHAB POTENTIAL: Excellent  CLINICAL DECISION MAKING: Evolving/moderate complexity  EVALUATION COMPLEXITY: Moderate   PLAN:  PT FREQUENCY: 1x/week  PT DURATION: 4 weeks  PLANNED INTERVENTIONS: 97110-Therapeutic exercises, 97530- Therapeutic activity, [id number]- Neuromuscular re-education, [phone]- Self Care, [id number]- Gait training, [phone]- Canalith repositioning, and Patient/Family education  PLAN FOR NEXT SESSION: recheck Rt BPPV - treat prn; check habituation exercise - sit to Rt sidelying    Mckinzey Entwistle, [place], PT 11/20/2023, 6:47 PM

## 2023-11-24 ENCOUNTER — Ambulatory Visit: Attending: Cardiology | Admitting: Cardiology

## 2023-11-24 ENCOUNTER — Encounter: Payer: Self-pay | Admitting: Cardiology

## 2023-11-24 VITALS — BP 138/84 | HR 79 | Ht 68.0 in | Wt 167.2 lb

## 2023-11-24 DIAGNOSIS — I739 Peripheral vascular disease, unspecified: Secondary | ICD-10-CM | POA: Diagnosis not present

## 2023-11-24 DIAGNOSIS — I2510 Atherosclerotic heart disease of native coronary artery without angina pectoris: Secondary | ICD-10-CM | POA: Diagnosis not present

## 2023-11-24 DIAGNOSIS — E785 Hyperlipidemia, unspecified: Secondary | ICD-10-CM

## 2023-11-24 DIAGNOSIS — I10 Essential (primary) hypertension: Secondary | ICD-10-CM | POA: Diagnosis not present

## 2023-11-24 DIAGNOSIS — E1151 Type 2 diabetes mellitus with diabetic peripheral angiopathy without gangrene: Secondary | ICD-10-CM | POA: Diagnosis not present

## 2023-11-24 DIAGNOSIS — G4733 Obstructive sleep apnea (adult) (pediatric): Secondary | ICD-10-CM | POA: Diagnosis not present

## 2023-11-24 DIAGNOSIS — Z95828 Presence of other vascular implants and grafts: Secondary | ICD-10-CM

## 2023-11-24 NOTE — Patient Instructions (Signed)
 Medication Instructions:  Your physician recommends that you continue on your current medications as directed. Please refer to the Current Medication list given to you today.  *If you need a refill on your cardiac medications before your next appointment, please call your pharmacy*  Lab Work: None ordered   Testing/Procedures: None ordered  Follow-Up: At [facility], you and your health needs are our priority.  As part of our continuing mission to provide you with exceptional heart care, our providers are all part of one team.  This team includes your primary Cardiologist (physician) and Advanced Practice Providers or APPs (Physician Assistants and Nurse Practitioners) who all work together to provide you with the care you need, when you need it.  Your next appointment:   1 year(s)  Provider:   Oneil Parchment, MD    Thank you for choosing [facility]!!   [phone]

## 2023-11-24 NOTE — Progress Notes (Signed)
 Cardiology Office Note:  .   Date:  11/24/2023  ID:  ADDEN STROUT, DOB 04/05/1950, MRN [id number] PCP: Loreli Kins, MD  [facility] Providers Cardiologist:  Oneil Parchment, MD     History of Present Illness: .   Ray Brown is a 73 y.o. male Discussed the use of AI scribe software   History of Present Illness Ray Brown is a 73 year old male with coronary artery disease and peripheral arterial disease who presents for follow-up.  He has a history of a bare metal stent placed in the right coronary artery (RCA) in 2008, with a repeat heart catheterization in October 2016 showing a patent RCA stent. He is currently taking aspirin  81 mg and clopidogrel  75 mg daily as antiplatelet therapy. His echocardiogram from September 28, 2018, showed a normal ejection fraction of 60-65% and aortic atherosclerosis, with no evidence of stenosis.  He has a history of peripheral arterial disease, status post right iliac stent and aortic bifemoral bypass in 2003. He experiences pain that radiates from his back down his leg to almost his ankle, which is exacerbated by sneezing. He has difficulty walking long distances due to this pain. Vascular ultrasounds show moderate stenosis in the left common femoral artery, but ankle-brachial indices (ABIs) are in the normal range bilaterally.  He has diabetes with a recent A1c of 7.5% in May 2025. He is taking Jardiance 25 mg daily. He also has hyperlipidemia, for which he takes rosuvastatin 20 mg and fenofibrate  160 mg daily. His LDL was 47 mg/dL and triglycerides were 210 mg/dL as of Jul 16, 2023.  He experiences vertigo, which is severe enough to make him feel like he is going to fall over and causes nausea. He is currently undergoing physical therapy to address this issue.  He has a history of weight loss, having decreased from 193 lbs in December to 167 lbs currently. He previously lost significant weight five years ago when part of his colon was  removed, dropping from 210 lbs to 172 lbs.  No bleeding problems while on aspirin  and clopidogrel . He feels nauseated all the time due to vertigo.      ROS: No CP  Studies Reviewed: SABRA   EKG Interpretation Date/Time:  Friday November 24 2023 08:44:56 EDT Ventricular Rate:  81 PR Interval:  130 QRS Duration:  102 QT Interval:  364 QTC Calculation: 422 R Axis:   11  Text Interpretation: Normal sinus rhythm Normal ECG When compared with ECG of 23-Aug-2022 07:41, No significant change since last tracing Confirmed by Parchment Oneil ([id number]) on 11/24/2023 8:52:40 AM    Results LABS LDL: 47 (06/26/2023) Triglycerides: 210 (06/26/2023) HbA1c: 7.5 (06/2023) Hemoglobin: 15.6 ALT: 21  DIAGNOSTIC Echocardiogram: Normal ejection fraction, aortic atherosclerosis, EF 60-65%, no evidence of stenosis (09/28/2018) Risk Assessment/Calculations:            Physical Exam:   VS:  BP 138/84   Pulse 79   Ht 5' 8 (1.727 m)   Wt 167 lb 3.2 oz (75.8 kg)   SpO2 98%   BMI 25.42 kg/m    Wt Readings from Last 3 Encounters:  11/24/23 167 lb 3.2 oz (75.8 kg)  10/26/23 172 lb (78 kg)  01/24/23 193 lb (87.5 kg)    GEN: Well nourished, well developed in no acute distress NECK: No JVD; No carotid bruits CARDIAC: RRR, no murmurs, no rubs, no gallops RESPIRATORY:  Clear to auscultation without rales, wheezing or rhonchi  ABDOMEN: Soft, non-tender, non-distended  EXTREMITIES:  No edema; No deformity   ASSESSMENT AND PLAN: .    Assessment and Plan Assessment & Plan Coronary artery disease, status post RCA stent Coronary artery disease is well-managed with no new symptoms. He is on aspirin  and clopidogrel  without bleeding complications. The RCA stent placed in 2008 remains patent as per the last catheterization in 2016. - Continue aspirin  81 mg daily - Continue clopidogrel  75 mg daily - Schedule follow-up in one year  Peripheral arterial disease, status post right iliac stent and aortic bifemoral  bypass Peripheral arterial disease with right iliac stent and aortic bifemoral bypass. He reports leg pain radiating to the ankle, possibly nerve-related, with difficulty walking long distances. Vascular ultrasounds show moderate stenosis in the left common femoral artery, but ABIs are normal. - Continue current management and follow-up with vascular surgery as needed - Encourage mobility as tolerated  Type 2 diabetes mellitus Type 2 diabetes mellitus with an A1c of 7.5% as of May 2025, indicating suboptimal glycemic control. - Continue Jardiance 25 mg daily  Mixed hyperlipidemia Mixed hyperlipidemia managed with rosuvastatin and fenofibrate . LDL is well-controlled at 47 mg/dL, and triglycerides are at 210 mg/dL. - Continue rosuvastatin 20 mg daily - Continue fenofibrate  160 mg daily  Hypertension Hypertension is slightly elevated with a blood pressure reading of 138/84 mmHg. - Continue Toprol  25 mg daily         Dispo: 1 yr  Signed, Oneil Parchment, MD

## 2023-11-28 ENCOUNTER — Ambulatory Visit: Payer: Self-pay | Attending: Neurology | Admitting: Physical Therapy

## 2023-11-28 DIAGNOSIS — R42 Dizziness and giddiness: Secondary | ICD-10-CM | POA: Diagnosis present

## 2023-11-28 DIAGNOSIS — H8111 Benign paroxysmal vertigo, right ear: Secondary | ICD-10-CM | POA: Insufficient documentation

## 2023-11-28 NOTE — Therapy (Addendum)
 OUTPATIENT PHYSICAL THERAPY VESTIBULAR TREATMENT     Patient Name: Ray Brown MRN: [id number] DOB:[birth year], 73 y.o., male Today's Date: 11/29/2023  END OF SESSION:  PT End of Session - 11/29/23 2051     Visit Number 3    Number of Visits 4    Date for Recertification  12/01/23    Authorization Type [employer] Medicare    Authorization Time Period 11-07-23 - 01-07-24    PT Start Time 1020    PT Stop Time 1100    PT Time Calculation (min) 40 min    Activity Tolerance Patient tolerated treatment well    Behavior During Therapy [facility] for tasks assessed/performed           Past Medical History:  Diagnosis Date   Arthritis    Bronchitis    Chronic back pain    Complication of anesthesia    difficulty breathing s/p back surgery at [facility] 2013   Coronary artery disease    Diabetes mellitus without complication (HCC)    Dry skin    in the back of head-left side   GERD (gastroesophageal reflux disease)    H/O hiatal hernia    Hypercholesteremia    Peripheral vascular disease    Pneumonia    Shortness of breath    Sleep apnea    has CPAP but does not use   Unstable angina (HCC) 12/05/2014   Past Surgical History:  Procedure Laterality Date   AORTA - BILATERAL FEMORAL ARTERY BYPASS GRAFT  11/27/2001   Dr Krystal Early.  Aortobifemoral bypass with a 14 x 8 Hemashield graft.   BACK SURGERY  2004   CARDIAC CATHETERIZATION  2010   x 2 stents RCA   CARDIAC CATHETERIZATION N/A 12/08/2014   Procedure: Left Heart Cath and Coronary Angiography;  Surgeon: Dorn JINNY Lesches, MD;  Location: [facility] INVASIVE CV LAB;  Service: Cardiovascular;  Laterality: N/A;   COLONOSCOPY  08/04/2009   CORONARY ANGIOPLASTY     2 stents   FOOT SURGERY  1980s   right foot   LYSIS OF ADHESION N/A 01/16/2019   Procedure: LYSIS OF ADHESIONS;  Surgeon: Sheldon Standing, MD;  Location: [facility] ORS;  Service: General;  Laterality: N/A;   PROCTOSCOPY N/A 01/16/2019   Procedure: RIGID PROCTOSCOPY;  Surgeon: Sheldon Standing, MD;   Location: [facility] ORS;  Service: General;  Laterality: N/A;   SHOULDER ARTHROSCOPY WITH SUBACROMIAL DECOMPRESSION Right 03/28/2013   Procedure: RIGHT SHOULDER ARTHROSCOPY WITH SUBACROMIAL DECOMPRESSION AND DISTAL CLAVICLE RESECTION;  Surgeon: Franky CHRISTELLA Pointer, MD;  Location: [facility] OR;  Service: Orthopedics;  Laterality: Right;   SHOULDER SURGERY     left shoulder rotator cuff repair   UPPER GI ENDOSCOPY     XI ROBOTIC ASSISTED LOWER ANTERIOR RESECTION N/A 01/16/2019   Procedure: XI ROBOTIC RESECTION OF SIGMOID COLON;  Surgeon: Sheldon Standing, MD;  Location: [facility] ORS;  Service: General;  Laterality: N/A;   Patient Active Problem List   Diagnosis Date Noted   Essential hypertension 08/23/2022   Pure hypercholesterolemia 08/23/2022   Diverticulitis with abscess s/p robotic LAR rectosigmoid resection 01/16/2019 01/16/2019   Hypomagnesemia 12/29/2018   Protein-calorie malnutrition, moderate 12/29/2018   Chronic anticoagulation 12/28/2018   Coronary stent 2008  restenosis due to progression of disease s/p restent 2010 12/28/2018   Status post aortobifemoral bypass surgery 2013 12/28/2018   Left-sided low back pain with left-sided sciatica 12/28/2018   Nausea & vomiting 12/28/2018   Hypokalemia 12/28/2018   Chronic back pain    BPPV (benign  paroxysmal positional vertigo) 11/10/2018   Abscess of sigmoid colon due to diverticulitis 11/03/2018   Dyslipidemia 11/03/2018   OSA (obstructive sleep apnea) 11/03/2018   Pain of right shoulder joint on movement 06/20/2018   Dyspnea 12/05/2014   Dizziness 12/05/2014   Diabetes mellitus with peripheral artery disease (HCC) 10/04/2013   Coronary artery disease involving native coronary artery of native heart without angina pectoris 10/04/2013   Impingement syndrome of right shoulder 03/28/2013   Obesity 01/21/2013   Peripheral vascular disease     PCP: Loreli Kins, MD REFERRING PROVIDER: Georjean Darice HERO, MD  REFERRING DIAG:  Diagnosis  H81.399 (ICD-10-CM)  - Peripheral vertigo, unspecified laterality    THERAPY DIAG:  BPPV (benign paroxysmal positional vertigo), right  ONSET DATE: Referral date 10-26-23  Rationale for Evaluation and Treatment: Rehabilitation  SUBJECTIVE:   SUBJECTIVE STATEMENT: Pt reports yesterday was a bad day - had nausea and vomiting last night - did not sleep well at all last night due to not feeling well due to the vertigo: felt like losing balance backward when vertigo re-occurred. Overall doesn't see a lot of improvement in vertigo at this time  Pt accompanied by:  self  PERTINENT HISTORY: history of hyperlipidemia, DM2, diverticulitis s/p surgical resection, PVD  PAIN: No pain related to dizziness- no headaches or cervical pain; will not address back pain as out of scope of this referral but will monitor  Intermittent occurrence - varies in intensity  Are you having pain? No and Yes: NPRS scale: 5/10  Pain location: low back radiating down Rt leg  Pain description: soreness, nagging Aggravating factors: weight bearing  - standing/walking Relieving factors: sitting or lying down  PRECAUTIONS: None  RED FLAGS: None   WEIGHT BEARING RESTRICTIONS: No  FALLS: Has patient fallen in last 6 months? No  LIVING ENVIRONMENT: Lives with: lives with their spouse Lives in: House/apartment Stairs: No Has following equipment at home: None  PLOF: Independent  PATIENT GOALS: resolve the dizziness  OBJECTIVE:  Note: Objective measures were completed at Evaluation unless otherwise noted.  DIAGNOSTIC FINDINGS: IMPRESSION: 07-27-23 MRI 1. Normal MRI of the brain. Resolution of previously seen dural thickening. 2. Moderate bilateral maxillary sinus disease with retention cysts.  COGNITION: Overall cognitive status: Within functional limits for tasks assessed   GAIT: Gait pattern: [facility] Distance walked: 85' Assistive device utilized: None Level of assistance: Complete Independence Comments: no unsteadiness  noted at start of eval session  VESTIBULAR ASSESSMENT:  GENERAL OBSERVATION: pt is 73 yr old gentleman amb. Independently without device; reports he has had chronic vertigo since 2020   SYMPTOM BEHAVIOR:  Subjective history: pt reports he has had vertigo since 2020 when he was getting ready to have a MRI done due to diverticulitis  Non-Vestibular symptoms: N/A  Type of dizziness: Spinning/Vertigo  Frequency: varies - depends on movement  Duration: secs to minutes  Aggravating factors: Induced by position change: lying supine, rolling to the right, rolling to the left, and supine to sit  Relieving factors: head stationary  Progression of symptoms: unchanged  POSITIONAL TESTING: Right Dix-Hallpike: upbeating, right nystagmus Left Dix-Hallpike: no nystagmus Right Sidelying: no nystagmus Left Sidelying: no nystagmus  MOTION SENSITIVITY:  Motion Sensitivity Quotient Intensity: 0 = none, 1 = Lightheaded, 2 = Mild, 3 = Moderate, 4 = Severe, 5 = Vomiting  Intensity  1. Sitting to supine   2. Supine to L side   3. Supine to R side   4. Supine to sitting   5. L Hallpike-Dix  6. Up from L    7. R Hallpike-Dix   8. Up from R    9. Sitting, head tipped to L knee   10. Head up from L knee   11. Sitting, head tipped to R knee   12. Head up from R knee   13. Sitting head turns x5   14.Sitting head nods x5   15. In stance, 180 turn to L    16. In stance, 180 turn to R                                                                                                                                 TREATMENT DATE: 11-28-23  NeuroRe-ed: Rt Dix-Hallpike test (+) for c/o dizziness - moderate intensity nystagmus- Rt rotary upbeating Lt Dix-Hallpike test (-)  with no c/o dizziness and no nystagmus noted  Habituation exercises - pt performed looking up at ceiling in seated position  - no vertigo provoked; looked down at floor in seated position - no vertigo provoked;  progressed to standing  position - pt looked down at floor (performed after completion of 3 reps of Epley)  - no c/o vertigo reported   Canalith Repositioning:  Epley Right: Number of Reps: 3, Response to Treatment: symptoms improved, and Comment: 1st rep - dizzines in 1st positon2nd position - dizziness reported in 3rd position   And slightly downward rotary nystagmus noted in 1st position; moderate dizziness reported with sitting up - few seconds in duration; 3rd rep - no c/o vertigo and no nystagmus noted in any position of 3rd rep  NeuroRe-ed: Habituation exercises - pt performed looking up at ceiling in seated position  - no vertigo provoked; looked down at floor in seated position - no major postural instability noted with return of upright head position; progressed to standing position with pt bracing legs against mat table - looked down at floor for approx. 4-5 secs - looked up and had LOB posteriorly, using mat table to assist with balance recovery    Self Care; Reviewed habituation exercise of sitting <> Rt sidelying (pt stated he had not done this exercise since previous treatment session last week) Emphasized importance of staying well- hydrated (pt reports he does this)    Sit to Side-Lying    Sit on edge of bed. 1. Turn head 45 to right. 2. Maintain head position and lie down slowly on left side. Hold until symptoms subside. 3. Sit up slowly. Hold until symptoms subside. 4. Turn head 45 to left. 5. Maintain head position and lie down slowly on right side. Hold until symptoms subside. 6. Sit up slowly. Repeat sequence __5__ tmes per session. Do __2-3__ sessions per day.     PATIENT EDUCATION: Education details:  Reviewed habituation exercise - sit to Rt sidelying Person educated: Patient Education method: Explanation, Demonstration, and Handouts Education comprehension: verbalized understanding  HOME EXERCISE PROGRAM:  Habituation exercise  GOALS: Goals reviewed with  patient? Yes  SHORT  TERM GOALS: same as LTG's as ELOS = 4 weeks   LONG TERM GOALS: Target date: 12-01-23  Pt will have (-) Rt Dix-Hallpike test with no nystagmus and no c/o vertigo in test position to indicate resolution of Rt BPPV. Baseline:  Goal status: INITIAL  2.  Pt will report no vertigo with any bed mobility or bending over to demo resolution of Rt BPPV and to increase safety with mobility. Baseline:  Goal status: INITIAL  3.  Pt will verbalize understanding of Epley maneuver and/or Brandt-Daroff exercises for self treatment of BPPV should he have re-occurrence of vertiginous episode in the future.  Baseline:  Goal status: INITIAL  4.  Independent in HEP for habituation and vestibular exercises as appropriate.  Baseline:  Goal status: INITIAL   CLINICAL IMPRESSION: PT session focused on continued assessment and treatment of Rt BPPV posterior canalithiasis.  Pt had a (+) Rt Dix-Hallpike test with Rt rotary upbeating nystagmus and c/o moderate dizziness.  Symptoms improved with subsequent reps, with no dizziness and no nystagmus noted on 3rd rep.   Cont with POC.    OBJECTIVE IMPAIRMENTS: decreased balance, difficulty walking, and dizziness.   ACTIVITY LIMITATIONS: bending, standing, bed mobility, and locomotion level  PARTICIPATION LIMITATIONS: cleaning, laundry, community activity, and yard work  PERSONAL FACTORS: Past/current experiences, Time since onset of injury/illness/exacerbation, and 1-2 comorbidities: h/o chronic vertigo and diverticulitis are also affecting patient's functional outcome.   REHAB POTENTIAL: Excellent  CLINICAL DECISION MAKING: Evolving/moderate complexity  EVALUATION COMPLEXITY: Moderate   PLAN:  PT FREQUENCY: 1x/week  PT DURATION: 4 weeks  PLANNED INTERVENTIONS: 97110-Therapeutic exercises, 97530- Therapeutic activity, [id number]- Neuromuscular re-education, [phone]- Self Care, [id number]- Gait training, [phone]- Canalith repositioning, and Patient/Family  education  PLAN FOR NEXT SESSION: check LTG's - recheck Rt BPPV; check habituation exercise - sit to Rt sidelying    Bernyce Brimley, [place], PT 11/29/2023, 8:53 PM

## 2023-11-29 ENCOUNTER — Encounter: Payer: Self-pay | Admitting: Physical Therapy

## 2023-12-07 ENCOUNTER — Ambulatory Visit: Payer: Self-pay | Admitting: Physical Therapy

## 2023-12-07 DIAGNOSIS — H8111 Benign paroxysmal vertigo, right ear: Secondary | ICD-10-CM

## 2023-12-07 DIAGNOSIS — R42 Dizziness and giddiness: Secondary | ICD-10-CM

## 2023-12-07 NOTE — Therapy (Signed)
 OUTPATIENT PHYSICAL THERAPY VESTIBULAR TREATMENT     Patient Name: Ray Brown MRN: [id number] DOB:09-Jul-1950, 73 y.o., male Today's Date: 12/08/2023  END OF SESSION:  PT End of Session - 12/08/23 1849     Visit Number 4    Number of Visits 4    Date for Recertification  12/01/23    Authorization Type UHC Medicare    Authorization Time Period 11-07-23 - 01-07-24    PT Start Time 0848    PT Stop Time 0930    PT Time Calculation (min) 42 min    Activity Tolerance Patient tolerated treatment well    Behavior During Therapy [facility] for tasks assessed/performed            Past Medical History:  Diagnosis Date   Arthritis    Bronchitis    Chronic back pain    Complication of anesthesia    difficulty breathing s/p back surgery [facility] 2013   Coronary artery disease    Diabetes mellitus without complication (HCC)    Dry skin    in the back of head-left side   GERD (gastroesophageal reflux disease)    H/O hiatal hernia    Hypercholesteremia    Peripheral vascular disease    Pneumonia    Shortness of breath    Sleep apnea    has CPAP but does not use   Unstable angina (HCC) 12/05/2014   Past Surgical History:  Procedure Laterality Date   AORTA - BILATERAL FEMORAL ARTERY BYPASS GRAFT  11/27/2001   Dr Krystal Early.  Aortobifemoral bypass with a 14 x 8 Hemashield graft.   BACK SURGERY  2004   CARDIAC CATHETERIZATION  2010   x 2 stents RCA   CARDIAC CATHETERIZATION N/A 12/08/2014   Procedure: Left Heart Cath and Coronary Angiography;  Surgeon: Dorn JINNY Lesches, MD;  Location: [facility] INVASIVE CV LAB;  Service: Cardiovascular;  Laterality: N/A;   COLONOSCOPY  08/04/2009   CORONARY ANGIOPLASTY     2 stents   FOOT SURGERY  1980s   right foot   LYSIS OF ADHESION N/A 01/16/2019   Procedure: LYSIS OF ADHESIONS;  Surgeon: Sheldon Standing, MD;  Location: [facility];  Service: General;  Laterality: N/A;   PROCTOSCOPY N/A 01/16/2019   Procedure: RIGID PROCTOSCOPY;  Surgeon: Sheldon Standing,  MD;  Location: [facility];  Service: General;  Laterality: N/A;   SHOULDER ARTHROSCOPY WITH SUBACROMIAL DECOMPRESSION Right 03/28/2013   Procedure: RIGHT SHOULDER ARTHROSCOPY WITH SUBACROMIAL DECOMPRESSION AND DISTAL CLAVICLE RESECTION;  Surgeon: Franky CHRISTELLA Pointer, MD;  Location: [facility];  Service: Orthopedics;  Laterality: Right;   SHOULDER SURGERY     left shoulder rotator cuff repair   UPPER GI ENDOSCOPY     XI ROBOTIC ASSISTED LOWER ANTERIOR RESECTION N/A 01/16/2019   Procedure: XI ROBOTIC RESECTION OF SIGMOID COLON;  Surgeon: Sheldon Standing, MD;  Location: [facility];  Service: General;  Laterality: N/A;   Patient Active Problem List   Diagnosis Date Noted   Essential hypertension 08/23/2022   Pure hypercholesterolemia 08/23/2022   Diverticulitis with abscess s/p robotic LAR rectosigmoid resection 01/16/2019 01/16/2019   Hypomagnesemia 12/29/2018   Protein-calorie malnutrition, moderate 12/29/2018   Chronic anticoagulation 12/28/2018   Coronary stent 2008  restenosis due to progression of disease s/p restent 2010 12/28/2018   Status post aortobifemoral bypass surgery 2013 12/28/2018   Left-sided low back pain with left-sided sciatica 12/28/2018   Nausea & vomiting 12/28/2018   Hypokalemia 12/28/2018   Chronic back pain    BPPV (  benign paroxysmal positional vertigo) 11/10/2018   Abscess of sigmoid colon due to diverticulitis 11/03/2018   Dyslipidemia 11/03/2018   OSA (obstructive sleep apnea) 11/03/2018   Pain of right shoulder joint on movement 06/20/2018   Dyspnea 12/05/2014   Dizziness 12/05/2014   Diabetes mellitus with peripheral artery disease (HCC) 10/04/2013   Coronary artery disease involving native coronary artery of native heart without angina pectoris 10/04/2013   Impingement syndrome of right shoulder 03/28/2013   Obesity 01/21/2013   Peripheral vascular disease     PCP: Loreli Kins, MD REFERRING PROVIDER: Georjean Darice HERO, MD  REFERRING DIAG:  Diagnosis  H81.399  (ICD-10-CM) - Peripheral vertigo, unspecified laterality    THERAPY DIAG:  BPPV (benign paroxysmal positional vertigo), right  Dizziness and giddiness  ONSET DATE: Referral date 10-26-23  Rationale for Evaluation and Treatment: Rehabilitation  SUBJECTIVE:   SUBJECTIVE STATEMENT: Pt reports he went deep sea fishing this past weekend and got dizzy reeling in a fish; says dizziness is pretty bad today; pt reports dizziness usually occurs when he looks down  Pt accompanied by:  self  PERTINENT HISTORY: history of hyperlipidemia, DM2, diverticulitis s/p surgical resection, PVD  PAIN: No pain related to dizziness- no headaches or cervical pain; will not address back pain as out of scope of this referral but will monitor  Intermittent occurrence - varies in intensity; no back pain reported on 12-07-23  Are you having pain? No and Yes: NPRS scale: 5/10  Pain location: low back radiating down Rt leg  Pain description: soreness, nagging Aggravating factors: weight bearing  - standing/walking Relieving factors: sitting or lying down  PRECAUTIONS: None  RED FLAGS: None   WEIGHT BEARING RESTRICTIONS: No  FALLS: Has patient fallen in last 6 months? No  LIVING ENVIRONMENT: Lives with: lives with their spouse Lives in: House/apartment Stairs: No Has following equipment at home: None  PLOF: Independent  PATIENT GOALS: resolve the dizziness  OBJECTIVE:  Note: Objective measures were completed at Evaluation unless otherwise noted.  DIAGNOSTIC FINDINGS: IMPRESSION: 07-27-23 MRI 1. Normal MRI of the brain. Resolution of previously seen dural thickening. 2. Moderate bilateral maxillary sinus disease with retention cysts.  COGNITION: Overall cognitive status: Within functional limits for tasks assessed   GAIT: Gait pattern: [facility] Distance walked: 63' Assistive device utilized: None Level of assistance: Complete Independence Comments: no unsteadiness noted at start of eval  session  VESTIBULAR ASSESSMENT:  GENERAL OBSERVATION: pt is 73 yr old gentleman amb. Independently without device; reports he has had chronic vertigo since 2020   SYMPTOM BEHAVIOR:  Subjective history: pt reports he has had vertigo since 2020 when he was getting ready to have a MRI done due to diverticulitis  Non-Vestibular symptoms: N/A  Type of dizziness: Spinning/Vertigo  Frequency: varies - depends on movement  Duration: secs to minutes  Aggravating factors: Induced by position change: lying supine, rolling to the right, rolling to the left, and supine to sit  Relieving factors: head stationary  Progression of symptoms: unchanged  POSITIONAL TESTING: Right Dix-Hallpike: upbeating, right nystagmus Left Dix-Hallpike: no nystagmus Right Sidelying: no nystagmus Left Sidelying: no nystagmus  MOTION SENSITIVITY:  Motion Sensitivity Quotient Intensity: 0 = none, 1 = Lightheaded, 2 = Mild, 3 = Moderate, 4 = Severe, 5 = Vomiting  Intensity  1. Sitting to supine   2. Supine to L side   3. Supine to R side   4. Supine to sitting   5. L Hallpike-Dix   6. Up from L  7. R Hallpike-Dix   8. Up from R    9. Sitting, head tipped to L knee   10. Head up from L knee   11. Sitting, head tipped to R knee   12. Head up from R knee   13. Sitting head turns x5   14.Sitting head nods x5   15. In stance, 180 turn to L    16. In stance, 180 turn to R                                                                                                                                 TREATMENT DATE: 12-07-23  NeuroRe-ed: Rt Dix-Hallpike test (+) for c/o dizziness; slightly downward rotary component noted in test position Lt Dix-Hallpike test (-)  with no c/o dizziness and no nystagmus noted   Habituation exercises - pt performed looking up at ceiling in seated position  - no vertigo provoked; looked down at floor in seated position - no vertigo provoked;  progressed to standing position - pt  looked down at floor - no c/o vertigo reported   Canalith Repositioning:  Epley Right: Number of Reps: 2, Response to Treatment: symptoms improved, and Comment: 1st rep - dizzines in 1st positon2nd rep- dizziness reported in 3rd position Epley maneuver Left - for Rt anterior canalithiasis based on slight rotary downward nystagmus - no nystagmus noted in any position of Lt Epley maneuver   Epley maneuver Right performed after Left Epley maneuver - no c/o dizziness and no nystagmus noted in any position of 3rd rep of Rt Epley maneuver  Habituation exercises - pt performed looking up at ceiling in seated position  - no vertigo provoked; looked down at floor in seated position - no unsteadiness or LOB   Self Care; Reviewed habituation exercise of sitting <> Rt sidelying     Sit to Side-Lying    Sit on edge of bed. 1. Turn head 45 to right. 2. Maintain head position and lie down slowly on left side. Hold until symptoms subside. 3. Sit up slowly. Hold until symptoms subside. 4. Turn head 45 to left. 5. Maintain head position and lie down slowly on right side. Hold until symptoms subside. 6. Sit up slowly. Repeat sequence __5__ tmes per session. Do __2-3__ sessions per day.     PATIENT EDUCATION: Education details:  Reviewed habituation exercise - sit to Rt sidelying Person educated: Patient Education method: Explanation, Demonstration, and Handouts Education comprehension: verbalized understanding  HOME EXERCISE PROGRAM:  Habituation exercise  GOALS: Goals reviewed with patient? Yes  SHORT TERM GOALS: same as LTG's as ELOS = 4 weeks   LONG TERM GOALS: Target date: 12-01-23;  (Goals ongoing to match POC end date 12-20-23)  Pt will have (-) Rt Dix-Hallpike test with no nystagmus and no c/o vertigo in test position to indicate resolution of Rt BPPV. Baseline:  Goal status: Ongoing 12-07-23  2.  Pt will report no  vertigo with any bed mobility or bending over to demo resolution of  Rt BPPV and to increase safety with mobility. Baseline:  Goal status: Ongoing 12-07-23  3.  Pt will verbalize understanding of Epley maneuver and/or Brandt-Daroff exercises for self treatment of BPPV should he have re-occurrence of vertiginous episode in the future.  Baseline:  Goal status: Ongoing 12-07-23  4.  Independent in HEP for habituation and vestibular exercises as appropriate.  Baseline:  Goal status: Goal met 12-07-23   CLINICAL IMPRESSION: PT session focused on continued assessment and treatment of Rt BPPV posterior canalithiasis.  Pt had a (+) Rt Dix-Hallpike test with c/o dizziness and Rt rotary downbeating nystagmus, possibly Rt anterior canalithiasis.  Symptoms improved with subsequent reps and with Lt Epley maneuver for anterior canalithiasis.  Cont with POC.    OBJECTIVE IMPAIRMENTS: decreased balance, difficulty walking, and dizziness.   ACTIVITY LIMITATIONS: bending, standing, bed mobility, and locomotion level  PARTICIPATION LIMITATIONS: cleaning, laundry, community activity, and yard work  PERSONAL FACTORS: Past/current experiences, Time since onset of injury/illness/exacerbation, and 1-2 comorbidities: h/o chronic vertigo and diverticulitis are also affecting patient's functional outcome.   REHAB POTENTIAL: Excellent  CLINICAL DECISION MAKING: Evolving/moderate complexity  EVALUATION COMPLEXITY: Moderate   PLAN:  PT FREQUENCY: 1x/week  PT DURATION: 4 weeks  PLANNED INTERVENTIONS: 97110-Therapeutic exercises, 97530- Therapeutic activity, [id number]- Neuromuscular re-education, [phone]- Self Care, [id number]- Gait training, [phone]- Canalith repositioning, and Patient/Family education  PLAN FOR NEXT SESSION:  recheck Rt BPPV; check habituation exercise - sit to Rt sidelying    Chesky Heyer, [place], PT 12/08/2023, 6:53 PM

## 2023-12-08 ENCOUNTER — Encounter: Payer: Self-pay | Admitting: Physical Therapy

## 2023-12-14 ENCOUNTER — Ambulatory Visit: Admitting: Physical Therapy

## 2023-12-14 DIAGNOSIS — H8111 Benign paroxysmal vertigo, right ear: Secondary | ICD-10-CM | POA: Diagnosis not present

## 2023-12-14 DIAGNOSIS — R42 Dizziness and giddiness: Secondary | ICD-10-CM

## 2023-12-14 NOTE — Therapy (Signed)
 OUTPATIENT PHYSICAL THERAPY VESTIBULAR TREATMENT     Patient Name: Ray Brown MRN: [id number] DOB:1950-08-18, 73 y.o., male Today's Date: 12/17/2023  END OF SESSION:  PT End of Session - 12/17/23 1918     Visit Number 5    Number of Visits 6    Date for Recertification  01/05/24    Authorization Type UHC Medicare    Authorization Time Period 11-07-23 - 01-07-24    PT Start Time 0931    PT Stop Time 1015    PT Time Calculation (min) 44 min    Activity Tolerance Patient tolerated treatment well    Behavior During Therapy [facility] for tasks assessed/performed             Past Medical History:  Diagnosis Date   Arthritis    Bronchitis    Chronic back pain    Complication of anesthesia    difficulty breathing s/p back [facility]   Coronary artery disease    Diabetes mellitus without complication (HCC)    Dry skin    in the back of head-left side   GERD (gastroesophageal reflux disease)    H/O hiatal hernia    Hypercholesteremia    Peripheral vascular disease    Pneumonia    Shortness of breath    Sleep apnea    has CPAP but does not use   Unstable angina (HCC) 12/05/2014   Past Surgical History:  Procedure Laterality Date   AORTA - BILATERAL FEMORAL ARTERY BYPASS GRAFT  11/27/2001   Dr Krystal Early.  Aortobifemoral bypass with a 14 x 8 Hemashield graft.   BACK SURGERY  2004   CARDIAC CATHETERIZATION  2010   x 2 stents RCA   CARDIAC CATHETERIZATION N/A 12/08/2014   Procedure: Left Heart Cath and Coronary Angiography;  Surgeon: Dorn JINNY Lesches, MD;  Location: [facility] INVASIVE CV LAB;  Service: Cardiovascular;  Laterality: N/A;   COLONOSCOPY  08/04/2009   CORONARY ANGIOPLASTY     2 stents   FOOT SURGERY  1980s   right foot   LYSIS OF ADHESION N/A 01/16/2019   Procedure: LYSIS OF ADHESIONS;  Surgeon: Sheldon Standing, MD;  Location: [facility];  Service: General;  Laterality: N/A;   PROCTOSCOPY N/A 01/16/2019   Procedure: RIGID PROCTOSCOPY;  Surgeon: Sheldon Standing, MD;  Location: [facility];  Service: General;  Laterality: N/A;   SHOULDER ARTHROSCOPY WITH SUBACROMIAL DECOMPRESSION Right 03/28/2013   Procedure: RIGHT SHOULDER ARTHROSCOPY WITH SUBACROMIAL DECOMPRESSION AND DISTAL CLAVICLE RESECTION;  Surgeon: Franky CHRISTELLA Pointer, MD;  Location: [facility] OR;  Service: Orthopedics;  Laterality: Right;   SHOULDER SURGERY     left shoulder rotator cuff repair   UPPER GI ENDOSCOPY     XI ROBOTIC ASSISTED LOWER ANTERIOR RESECTION N/A 01/16/2019   Procedure: XI ROBOTIC RESECTION OF SIGMOID COLON;  Surgeon: Sheldon Standing, MD;  Location: [facility];  Service: General;  Laterality: N/A;   Patient Active Problem List   Diagnosis Date Noted   Essential hypertension 08/23/2022   Pure hypercholesterolemia 08/23/2022   Diverticulitis with abscess s/p robotic LAR rectosigmoid resection 01/16/2019 01/16/2019   Hypomagnesemia 12/29/2018   Protein-calorie malnutrition, moderate 12/29/2018   Chronic anticoagulation 12/28/2018   Coronary stent 2008  restenosis due to progression of disease s/p restent 2010 12/28/2018   Status post aortobifemoral bypass surgery 2013 12/28/2018   Left-sided low back pain with left-sided sciatica 12/28/2018   Nausea & vomiting 12/28/2018   Hypokalemia 12/28/2018   Chronic back pain  BPPV (benign paroxysmal positional vertigo) 11/10/2018   Abscess of sigmoid colon due to diverticulitis 11/03/2018   Dyslipidemia 11/03/2018   OSA (obstructive sleep apnea) 11/03/2018   Pain of right shoulder joint on movement 06/20/2018   Dyspnea 12/05/2014   Dizziness 12/05/2014   Diabetes mellitus with peripheral artery disease (HCC) 10/04/2013   Coronary artery disease involving native coronary artery of native heart without angina pectoris 10/04/2013   Impingement syndrome of right shoulder 03/28/2013   Obesity 01/21/2013   Peripheral vascular disease     PCP: Loreli Kins, MD REFERRING PROVIDER: Georjean Darice HERO, MD  REFERRING DIAG:  Diagnosis  H81.399  (ICD-10-CM) - Peripheral vertigo, unspecified laterality    THERAPY DIAG:  BPPV (benign paroxysmal positional vertigo), right  Dizziness and giddiness  ONSET DATE: Referral date 10-26-23  Rationale for Evaluation and Treatment: Rehabilitation  SUBJECTIVE:   SUBJECTIVE STATEMENT: Pt reports he feels a little dizzy right now;  pt states he felt good for remainder of day last Thursday after treatment; says it re-occurred on Friday, Saturday and Sunday -with Sunday being the worse; yesterday was a good day - only had the dizziness 2 or 3 times yesterday; says he did the Epley but it didn't seem to help a lot.  Pt reports he has a lot of nausea - says he ran out of medication and needs to call for a refill.   Pt accompanied by:  self  PERTINENT HISTORY: history of hyperlipidemia, DM2, diverticulitis s/p surgical resection, PVD  PAIN: No pain related to dizziness- no headaches or cervical pain; will not address back pain as out of scope of this referral but will monitor  Intermittent occurrence - varies in intensity; no back pain reported on 12-07-23  Are you having pain? No and Yes: NPRS scale: 5/10  Pain location: low back radiating down Rt leg  Pain description: soreness, nagging Aggravating factors: weight bearing  - standing/walking Relieving factors: sitting or lying down  PRECAUTIONS: None  RED FLAGS: None   WEIGHT BEARING RESTRICTIONS: No  FALLS: Has patient fallen in last 6 months? No  LIVING ENVIRONMENT: Lives with: lives with their spouse Lives in: House/apartment Stairs: No Has following equipment at home: None  PLOF: Independent  PATIENT GOALS: resolve the dizziness  OBJECTIVE:  Note: Objective measures were completed at Evaluation unless otherwise noted.  DIAGNOSTIC FINDINGS: IMPRESSION: 07-27-23 MRI 1. Normal MRI of the brain. Resolution of previously seen dural thickening. 2. Moderate bilateral maxillary sinus disease with retention  cysts.  COGNITION: Overall cognitive status: Within functional limits for tasks assessed   GAIT: Gait pattern: [facility] Distance walked: 72' Assistive device utilized: None Level of assistance: Complete Independence Comments: no unsteadiness noted at start of eval session  VESTIBULAR ASSESSMENT:  GENERAL OBSERVATION: pt is 73 yr old gentleman amb. Independently without device; reports he has had chronic vertigo since 2020   SYMPTOM BEHAVIOR:  Subjective history: pt reports he has had vertigo since 2020 when he was getting ready to have a MRI done due to diverticulitis  Non-Vestibular symptoms: N/A  Type of dizziness: Spinning/Vertigo  Frequency: varies - depends on movement  Duration: secs to minutes  Aggravating factors: Induced by position change: lying supine, rolling to the right, rolling to the left, and supine to sit  Relieving factors: head stationary  Progression of symptoms: unchanged  POSITIONAL TESTING: Right Dix-Hallpike: upbeating, right nystagmus Left Dix-Hallpike: no nystagmus Right Sidelying: no nystagmus Left Sidelying: no nystagmus  MOTION SENSITIVITY:  Motion Sensitivity Quotient Intensity: 0 =  none, 1 = Lightheaded, 2 = Mild, 3 = Moderate, 4 = Severe, 5 = Vomiting  Intensity  1. Sitting to supine   2. Supine to L side   3. Supine to R side   4. Supine to sitting   5. L Hallpike-Dix   6. Up from L    7. R Hallpike-Dix   8. Up from R    9. Sitting, head tipped to L knee   10. Head up from L knee   11. Sitting, head tipped to R knee   12. Head up from R knee   13. Sitting head turns x5   14.Sitting head nods x5   15. In stance, 180 turn to L    16. In stance, 180 turn to R                                                                                                                                 TREATMENT DATE: 12-14-23  NeuroRe-ed: Rt Dix-Hallpike test (+) for c/o dizziness; slightly downward rotary component noted in test  position    Canalith Repositioning:  Epley Right: Number of Reps: 3, Response to Treatment: symptoms improved, and Comment: 1st rep - dizzines in 1st positon   Used vibration over Rt mastoid area after 1st rep of Epley for Rt BPPV; pt had no dizziness in any position of 3rd rep of Epley  TherAct:  Habituation exercises - pt performed looking up at ceiling in seated position  - no vertigo provoked; looked down at floor in seated position - no vertigo provoked;  progressed to standing position - pt looked down at floor - no c/o vertigo reported     Sit to Side-Lying    Sit on edge of bed. 1. Turn head 45 to right. 2. Maintain head position and lie down slowly on left side. Hold until symptoms subside. 3. Sit up slowly. Hold until symptoms subside. 4. Turn head 45 to left. 5. Maintain head position and lie down slowly on right side. Hold until symptoms subside. 6. Sit up slowly. Repeat sequence __5__ tmes per session. Do __2-3__ sessions per day.     PATIENT EDUCATION: Education details:  Reviewed habituation exercise - sit to Rt sidelying Person educated: Patient Education method: Explanation, Demonstration, and Handouts Education comprehension: verbalized understanding  HOME EXERCISE PROGRAM:  Habituation exercise  GOALS: Goals reviewed with patient? Yes  SHORT TERM GOALS: same as LTG's as ELOS = 4 weeks   LONG TERM GOALS: Target date: 12-01-23;  (Goals ongoing to match POC end date 12-20-23)  Pt will have (-) Rt Dix-Hallpike test with no nystagmus and no c/o vertigo in test position to indicate resolution of Rt BPPV. Baseline:  Goal status: Ongoing 12-07-23  2.  Pt will report no vertigo with any bed mobility or bending over to demo resolution of Rt BPPV and to increase safety with mobility. Baseline:  Goal status: Ongoing 12-07-23  3.  Pt will verbalize understanding of Epley maneuver and/or Brandt-Daroff exercises for self treatment of BPPV should he have  re-occurrence of vertiginous episode in the future.  Baseline:  Goal status: Ongoing 12-07-23  4.  Independent in HEP for habituation and vestibular exercises as appropriate.  Baseline:  Goal status: Goal met 12-07-23   CLINICAL IMPRESSION: PT session focused on continued assessment and treatment of Rt BPPV posterior canalithiasis.  Pt had low amplitude nystagmus with direction of Rt rotary downward beating nystagmus in Rt Dix-Hallpike test position and also in 1st rep of Epley maneuver.  Used vibration over Rt mastoid region and no nystagmus/minimal dizziness reported on 2nd rep of Epley and no dizziness reported on 3rd rep of Epley.  Plan D/C next session.      OBJECTIVE IMPAIRMENTS: decreased balance, difficulty walking, and dizziness.   ACTIVITY LIMITATIONS: bending, standing, bed mobility, and locomotion level  PARTICIPATION LIMITATIONS: cleaning, laundry, community activity, and yard work  PERSONAL FACTORS: Past/current experiences, Time since onset of injury/illness/exacerbation, and 1-2 comorbidities: h/o chronic vertigo and diverticulitis are also affecting patient's functional outcome.   REHAB POTENTIAL: Excellent  CLINICAL DECISION MAKING: Evolving/moderate complexity  EVALUATION COMPLEXITY: Moderate   PLAN:  PT FREQUENCY: 1x/week  PT DURATION: 4 weeks  PLANNED INTERVENTIONS: 97110-Therapeutic exercises, 97530- Therapeutic activity, [id number]- Neuromuscular re-education, [phone]- Self Care, [id number]- Gait training, [phone]- Canalith repositioning, and Patient/Family education  PLAN FOR NEXT SESSION:  recheck Rt BPPV; check habituation exercise - sit to Rt sidelying    Yaslin Kirtley, [place], PT 12/17/2023, 7:19 PM

## 2023-12-17 ENCOUNTER — Encounter: Payer: Self-pay | Admitting: Physical Therapy

## 2023-12-21 ENCOUNTER — Ambulatory Visit: Payer: Self-pay | Admitting: Physical Therapy

## 2023-12-21 ENCOUNTER — Encounter: Payer: Self-pay | Admitting: Physical Therapy

## 2023-12-21 VITALS — BP 123/74 | HR 88

## 2023-12-21 DIAGNOSIS — H8111 Benign paroxysmal vertigo, right ear: Secondary | ICD-10-CM

## 2023-12-21 DIAGNOSIS — R42 Dizziness and giddiness: Secondary | ICD-10-CM

## 2023-12-21 DIAGNOSIS — R112 Nausea with vomiting, unspecified: Secondary | ICD-10-CM | POA: Diagnosis not present

## 2023-12-21 NOTE — Therapy (Signed)
 OUTPATIENT PHYSICAL THERAPY VESTIBULAR TREATMENT/RE-CERT     Patient Name: Ray Brown MRN: [id number] DOB:[birth year], 73 y.o., male Today's Date: 12/21/2023  END OF SESSION:  PT End of Session - 12/21/23 1016     Visit Number 6    Number of Visits 9   3 additional visits requested per renewal 12-21-23   Date for Recertification  01/05/24    Authorization Type [employer]    Authorization Time Period 11-07-23 - 01-07-24; 12-21-23 - 01-21-24    PT Start Time 0846    PT Stop Time 0930    PT Time Calculation (min) 44 min    Activity Tolerance Patient tolerated treatment well    Behavior During Therapy [facility] for tasks assessed/performed              Past Medical History:  Diagnosis Date   Arthritis    Bronchitis    Chronic back pain    Complication of anesthesia    difficulty breathing s/p back surgery at [facility] 2013   Coronary artery disease    Diabetes mellitus without complication (HCC)    Dry skin    in the back of head-left side   GERD (gastroesophageal reflux disease)    H/O hiatal hernia    Hypercholesteremia    Peripheral vascular disease    Pneumonia    Shortness of breath    Sleep apnea    has CPAP but does not use   Unstable angina (HCC) 12/05/2014   Past Surgical History:  Procedure Laterality Date   AORTA - BILATERAL FEMORAL ARTERY BYPASS GRAFT  11/27/2001   Dr Krystal Early.  Aortobifemoral bypass with a 14 x 8 Hemashield graft.   BACK SURGERY  2004   CARDIAC CATHETERIZATION  2010   x 2 stents RCA   CARDIAC CATHETERIZATION N/A 12/08/2014   Procedure: Left Heart Cath and Coronary Angiography;  Surgeon: Dorn JINNY Lesches, MD;  Location: [facility] INVASIVE CV LAB;  Service: Cardiovascular;  Laterality: N/A;   COLONOSCOPY  08/04/2009   CORONARY ANGIOPLASTY     2 stents   FOOT SURGERY  1980s   right foot   LYSIS OF ADHESION N/A 01/16/2019   Procedure: LYSIS OF ADHESIONS;  Surgeon: Sheldon Standing, MD;  Location: [facility] ORS;  Service: General;  Laterality: N/A;    PROCTOSCOPY N/A 01/16/2019   Procedure: RIGID PROCTOSCOPY;  Surgeon: Sheldon Standing, MD;  Location: [facility] OR[facility];  Service: General;  Laterality: N/A;   SHOULDER ARTHROSCOPY WITH SUBACROMIAL DECOMPRESSION Right 03/28/2013   Procedure: RIGHT SHOULDER ARTHROSCOPY WITH SUBACROMIAL DECOMPRESSION AND DISTAL CLAVICLE RESECTION;  Surgeon: Franky CHRISTELLA Pointer, MD;  Location: [facility] OR;  Service: Orthopedics;  Laterality: Right;   SHOULDER SURGERY     left shoulder rotator cuff repair   UPPER GI ENDOSCOPY     XI ROBOTIC ASSISTED LOWER ANTERIOR RESECTION N/A 01/16/2019   Procedure: XI ROBOTIC RESECTION OF SIGMOID COLON;  Surgeon: Sheldon Standing, MD;  Location: [facility];  Service: General;  Laterality: N/A;   Patient Active Problem List   Diagnosis Date Noted   Essential hypertension 08/23/2022   Pure hypercholesterolemia 08/23/2022   Diverticulitis with abscess s/p robotic LAR rectosigmoid resection 01/16/2019 01/16/2019   Hypomagnesemia 12/29/2018   Protein-calorie malnutrition, moderate 12/29/2018   Chronic anticoagulation 12/28/2018   Coronary stent 2008  restenosis due to progression of disease s/p restent 2010 12/28/2018   Status post aortobifemoral bypass surgery 2013 12/28/2018   Left-sided low back pain with left-sided sciatica 12/28/2018   Nausea & vomiting 12/28/2018  Hypokalemia 12/28/2018   Chronic back pain    BPPV (benign paroxysmal positional vertigo) 11/10/2018   Abscess of sigmoid colon due to diverticulitis 11/03/2018   Dyslipidemia 11/03/2018   OSA (obstructive sleep apnea) 11/03/2018   Pain of right shoulder joint on movement 06/20/2018   Dyspnea 12/05/2014   Dizziness 12/05/2014   Diabetes mellitus with peripheral artery disease (HCC) 10/04/2013   Coronary artery disease involving native coronary artery of native heart without angina pectoris 10/04/2013   Impingement syndrome of right shoulder 03/28/2013   Obesity 01/21/2013   Peripheral vascular disease     PCP: Loreli Kins,  MD REFERRING PROVIDER: Georjean Darice HERO, MD  REFERRING DIAG:  Diagnosis  H81.399 (ICD-10-CM) - Peripheral vertigo, unspecified laterality    THERAPY DIAG:  BPPV (benign paroxysmal positional vertigo), right  Dizziness and giddiness  ONSET DATE: Referral date 10-26-23  Rationale for Evaluation and Treatment: Rehabilitation  SUBJECTIVE:   SUBJECTIVE STATEMENT: Pt reports he has felt pretty good since Monday this week - has had dizziness maybe 2 or 3 times, 10-15 secs duration each time but overall is doing much better  Pt accompanied by:  self  PERTINENT HISTORY: history of hyperlipidemia, DM2, diverticulitis s/p surgical resection, PVD  PAIN: No pain related to dizziness- no headaches or cervical pain; will not address back pain as out of scope of this referral but will monitor  Intermittent occurrence - varies in intensity; no back pain reported on 12-07-23  Are you having pain? No and Yes: NPRS scale: 5/10  Pain location: low back radiating down Rt leg  Pain description: soreness, nagging Aggravating factors: weight bearing  - standing/walking Relieving factors: sitting or lying down  PRECAUTIONS: None  RED FLAGS: None   WEIGHT BEARING RESTRICTIONS: No  FALLS: Has patient fallen in last 6 months? No  LIVING ENVIRONMENT: Lives with: lives with their spouse Lives in: House/apartment Stairs: No Has following equipment at home: None  PLOF: Independent  PATIENT GOALS: resolve the dizziness  OBJECTIVE:  Note: Objective measures were completed at Evaluation unless otherwise noted.  DIAGNOSTIC FINDINGS: IMPRESSION: 07-27-23 MRI 1. Normal MRI of the brain. Resolution of previously seen dural thickening. 2. Moderate bilateral maxillary sinus disease with retention cysts.  COGNITION: Overall cognitive status: Within functional limits for tasks assessed   GAIT: Gait pattern: [facility] Distance walked: 86' Assistive device utilized: None Level of assistance: Complete  Independence Comments: no unsteadiness noted at start of eval session  VESTIBULAR ASSESSMENT:  GENERAL OBSERVATION: pt is 73 yr old gentleman amb. Independently without device; reports he has had chronic vertigo since 2020   SYMPTOM BEHAVIOR:  Subjective history: pt reports he has had vertigo since 2020 when he was getting ready to have a MRI done due to diverticulitis  Non-Vestibular symptoms: N/A  Type of dizziness: Spinning/Vertigo  Frequency: varies - depends on movement  Duration: secs to minutes  Aggravating factors: Induced by position change: lying supine, rolling to the right, rolling to the left, and supine to sit  Relieving factors: head stationary  Progression of symptoms: unchanged  POSITIONAL TESTING: Right Dix-Hallpike: upbeating, right nystagmus Left Dix-Hallpike: no nystagmus Right Sidelying: no nystagmus Left Sidelying: no nystagmus  MOTION SENSITIVITY:  Motion Sensitivity Quotient Intensity: 0 = none, 1 = Lightheaded, 2 = Mild, 3 = Moderate, 4 = Severe, 5 = Vomiting  Intensity  1. Sitting to supine   2. Supine to L side   3. Supine to R side   4. Supine to sitting   5. L  Hallpike-Dix   6. Up from L    7. R Hallpike-Dix   8. Up from R    9. Sitting, head tipped to L knee   10. Head up from L knee   11. Sitting, head tipped to R knee   12. Head up from R knee   13. Sitting head turns x5   14.Sitting head nods x5   15. In stance, 180 turn to L    16. In stance, 180 turn to R                                                                                                                                 TREATMENT DATE: 12-21-23  Vitals:   12/21/23 0900  BP: 123/74  Pulse: 88    NeuroRe-ed: Rt Dix-Hallpike test (+) for c/o dizziness; questionable slight downward very mild intensity nystagmus noted in test position, approx. 2-3 beats only - very difficult to discern; pt did report mild dizziness in test position; due to pt's c/o of dizziness,  performed Epley for Rt BPPV to determine if vertigo provoked in any position of Epley    Canalith Repositioning:  Epley Right: Number of Reps:  , Response to Treatment: symptoms improved, and Comment: 1st rep - very mild dizziness in 1st positon of Epley maneuver only     TherAct:  Habituation exercises - pt performed looking up at ceiling in seated position  - no vertigo provoked; looked down at floor in seated position - no vertigo provoked;  progressed to standing position - pt looked down at floor - no c/o vertigo reported; pt picked up 5 individual small cones off floor - handed to PT with reaching up for cervical extension - no c/o dizziness with this activity   Self Care:  Orthostatic hypotension assessment due to pt's c/o dizziness with supine to sitting    Vestibular Assessment - 12/21/23 0001       Orthostatics   BP supine (x 5 minutes) 126/80    HR supine (x 5 minutes) 79    BP sitting 114/76    HR sitting 85    BP standing (after 1 minute) 114/86    HR standing (after 1 minute) 87    BP standing (after 3 minutes) 116/83    HR standing (after 3 minutes) 97           Instructed pt to keep notes/journal of symptoms next week for any occurrences or correlations with dizziness   Sit to Side-Lying    Sit on edge of bed. 1. Turn head 45 to right. 2. Maintain head position and lie down slowly on left side. Hold until symptoms subside. 3. Sit up slowly. Hold until symptoms subside. 4. Turn head 45 to left. 5. Maintain head position and lie down slowly on right side. Hold until symptoms subside. 6. Sit up slowly. Repeat sequence __5__ tmes per session. Do __2-3__ sessions per day.  PATIENT EDUCATION: Education details:  Reviewed habituation exercise - sit to Rt sidelying Person educated: Patient Education method: Explanation, Demonstration, and Handouts Education comprehension: verbalized understanding  HOME EXERCISE PROGRAM:  Habituation  exercise  GOALS: Goals reviewed with patient? Yes  SHORT TERM GOALS: same as LTG's as ELOS = 4 weeks   LONG TERM GOALS: Target date: 12-01-23;  (Goals ongoing to match POC end date 12-20-23)  Pt will have (-) Rt Dix-Hallpike test with no nystagmus and no c/o vertigo in test position to indicate resolution of Rt BPPV. Baseline:  Goal status: Ongoing 12-21-23  2.  Pt will report no vertigo with any bed mobility or bending over to demo resolution of Rt BPPV and to increase safety with mobility. Baseline:  Goal status: Ongoing 12-21-23 - pt reports intermittent occurrence of vertigo with bed mobility  3.  Pt will verbalize understanding of Epley maneuver and/or Brandt-Daroff exercises for self treatment of BPPV should he have re-occurrence of vertiginous episode in the future.  Baseline:  Goal status: MET 12-21-23  4.  Independent in HEP for habituation and vestibular exercises as appropriate.  Baseline:  Goal status: Goal met 12-07-23    Updated LONG TERM GOALS: Target date: 01-21-24   Pt will have (-) Rt Dix-Hallpike test with no nystagmus and no c/o vertigo in test position to indicate resolution of Rt BPPV. Baseline:  Goal status: Ongoing 12-21-23  2.  Pt will report no vertigo with any bed mobility or bending over to demo resolution of Rt BPPV and to increase safety with mobility. Baseline:  Goal status: Ongoing 12-21-23 - pt reports intermittent occurrence of vertigo with bed mobility   CLINICAL IMPRESSION: Pt reports he has had a much better week this week with only 2-3 occurrences of episodes of dizziness, with duration approx. 10 secs.  Pt had (+) Rt Dix-Hallpike test with c/o dizziness in test position but only very mild and low intensity nystagmus (difficult to discern due to low amplitude).  Pt reports much improvement but not fully resolved as of current time.  Pt continues to report that dizziness is provoked with looking down but no dizziness provoked with activity  requiring bending over and looking down in today's session.  Pt will benefit from a few additional sessions to continue to assess and treat BPPV/dizziness.  Pt has met LTG's #3 and 4:  LTG's #1 and 2 are partially met as much improved since initial eval but not fully met due to pt's c/o ongoing intermittent vertigo/dizziness.   OBJECTIVE IMPAIRMENTS: decreased balance, difficulty walking, and dizziness.   ACTIVITY LIMITATIONS: bending, standing, bed mobility, and locomotion level  PARTICIPATION LIMITATIONS: cleaning, laundry, community activity, and yard work  PERSONAL FACTORS: Past/current experiences, Time since onset of injury/illness/exacerbation, and 1-2 comorbidities: h/o chronic vertigo and diverticulitis are also affecting patient's functional outcome.   REHAB POTENTIAL: Excellent  CLINICAL DECISION MAKING: Evolving/moderate complexity  EVALUATION COMPLEXITY: Moderate   PLAN:  PT FREQUENCY: 1x/week  PT DURATION: 4 weeks  PLANNED INTERVENTIONS: 97110-Therapeutic exercises, 97530- Therapeutic activity, [id number]- Neuromuscular re-education, [phone]- Self Care, [id number]- Gait training, [phone]- Canalith repositioning, and Patient/Family education  PLAN FOR NEXT SESSION:  recheck Rt BPPV; check habituation exercise - sit to Rt sidelying    Ople Girgis, [place], PT 12/21/2023, 10:21 AM

## 2023-12-22 ENCOUNTER — Other Ambulatory Visit: Payer: Self-pay

## 2023-12-22 ENCOUNTER — Emergency Department (HOSPITAL_COMMUNITY)

## 2023-12-22 ENCOUNTER — Encounter (HOSPITAL_COMMUNITY): Payer: Self-pay

## 2023-12-22 ENCOUNTER — Inpatient Hospital Stay (HOSPITAL_COMMUNITY)
Admission: EM | Admit: 2023-12-22 | Discharge: 2023-12-28 | DRG: 392 | Disposition: A | Discharge status: Home Health | Attending: Family Medicine | Admitting: Family Medicine

## 2023-12-22 DIAGNOSIS — E440 Moderate protein-calorie malnutrition: Secondary | ICD-10-CM | POA: Diagnosis present

## 2023-12-22 DIAGNOSIS — Z882 Allergy status to sulfonamides status: Secondary | ICD-10-CM

## 2023-12-22 DIAGNOSIS — J439 Emphysema, unspecified: Secondary | ICD-10-CM | POA: Diagnosis present

## 2023-12-22 DIAGNOSIS — K219 Gastro-esophageal reflux disease without esophagitis: Secondary | ICD-10-CM | POA: Diagnosis present

## 2023-12-22 DIAGNOSIS — Z8249 Family history of ischemic heart disease and other diseases of the circulatory system: Secondary | ICD-10-CM

## 2023-12-22 DIAGNOSIS — I119 Hypertensive heart disease without heart failure: Secondary | ICD-10-CM | POA: Diagnosis present

## 2023-12-22 DIAGNOSIS — K5900 Constipation, unspecified: Secondary | ICD-10-CM | POA: Insufficient documentation

## 2023-12-22 DIAGNOSIS — M4800 Spinal stenosis, site unspecified: Secondary | ICD-10-CM | POA: Insufficient documentation

## 2023-12-22 DIAGNOSIS — E785 Hyperlipidemia, unspecified: Secondary | ICD-10-CM | POA: Diagnosis present

## 2023-12-22 DIAGNOSIS — K2270 Barrett's esophagus without dysplasia: Secondary | ICD-10-CM | POA: Diagnosis present

## 2023-12-22 DIAGNOSIS — K449 Diaphragmatic hernia without obstruction or gangrene: Secondary | ICD-10-CM | POA: Diagnosis present

## 2023-12-22 DIAGNOSIS — I739 Peripheral vascular disease, unspecified: Secondary | ICD-10-CM | POA: Diagnosis present

## 2023-12-22 DIAGNOSIS — F419 Anxiety disorder, unspecified: Secondary | ICD-10-CM | POA: Diagnosis present

## 2023-12-22 DIAGNOSIS — I2510 Atherosclerotic heart disease of native coronary artery without angina pectoris: Secondary | ICD-10-CM | POA: Diagnosis present

## 2023-12-22 DIAGNOSIS — Z800 Family history of malignant neoplasm of digestive organs: Secondary | ICD-10-CM

## 2023-12-22 DIAGNOSIS — E7800 Pure hypercholesterolemia, unspecified: Secondary | ICD-10-CM | POA: Diagnosis present

## 2023-12-22 DIAGNOSIS — Z7982 Long term (current) use of aspirin: Secondary | ICD-10-CM

## 2023-12-22 DIAGNOSIS — F32A Depression, unspecified: Secondary | ICD-10-CM | POA: Diagnosis present

## 2023-12-22 DIAGNOSIS — N400 Enlarged prostate without lower urinary tract symptoms: Secondary | ICD-10-CM | POA: Diagnosis present

## 2023-12-22 DIAGNOSIS — F22 Delusional disorders: Secondary | ICD-10-CM | POA: Diagnosis present

## 2023-12-22 DIAGNOSIS — Z881 Allergy status to other antibiotic agents status: Secondary | ICD-10-CM

## 2023-12-22 DIAGNOSIS — E1151 Type 2 diabetes mellitus with diabetic peripheral angiopathy without gangrene: Secondary | ICD-10-CM | POA: Diagnosis present

## 2023-12-22 DIAGNOSIS — F05 Delirium due to known physiological condition: Secondary | ICD-10-CM | POA: Diagnosis present

## 2023-12-22 DIAGNOSIS — R112 Nausea with vomiting, unspecified: Secondary | ICD-10-CM | POA: Diagnosis not present

## 2023-12-22 DIAGNOSIS — Z9049 Acquired absence of other specified parts of digestive tract: Secondary | ICD-10-CM

## 2023-12-22 DIAGNOSIS — Z79899 Other long term (current) drug therapy: Secondary | ICD-10-CM

## 2023-12-22 DIAGNOSIS — K5730 Diverticulosis of large intestine without perforation or abscess without bleeding: Secondary | ICD-10-CM | POA: Diagnosis present

## 2023-12-22 DIAGNOSIS — G4733 Obstructive sleep apnea (adult) (pediatric): Secondary | ICD-10-CM | POA: Diagnosis present

## 2023-12-22 DIAGNOSIS — E8809 Other disorders of plasma-protein metabolism, not elsewhere classified: Secondary | ICD-10-CM | POA: Diagnosis present

## 2023-12-22 DIAGNOSIS — I10 Essential (primary) hypertension: Secondary | ICD-10-CM | POA: Diagnosis present

## 2023-12-22 DIAGNOSIS — Z7984 Long term (current) use of oral hypoglycemic drugs: Secondary | ICD-10-CM

## 2023-12-22 DIAGNOSIS — E876 Hypokalemia: Secondary | ICD-10-CM | POA: Diagnosis present

## 2023-12-22 DIAGNOSIS — R443 Hallucinations, unspecified: Secondary | ICD-10-CM | POA: Diagnosis present

## 2023-12-22 DIAGNOSIS — K2950 Unspecified chronic gastritis without bleeding: Secondary | ICD-10-CM | POA: Diagnosis present

## 2023-12-22 DIAGNOSIS — R42 Dizziness and giddiness: Secondary | ICD-10-CM | POA: Diagnosis present

## 2023-12-22 DIAGNOSIS — Z8701 Personal history of pneumonia (recurrent): Secondary | ICD-10-CM

## 2023-12-22 DIAGNOSIS — Z87891 Personal history of nicotine dependence: Secondary | ICD-10-CM

## 2023-12-22 DIAGNOSIS — G8929 Other chronic pain: Secondary | ICD-10-CM | POA: Diagnosis present

## 2023-12-22 DIAGNOSIS — E872 Acidosis, unspecified: Secondary | ICD-10-CM | POA: Diagnosis present

## 2023-12-22 DIAGNOSIS — A419 Sepsis, unspecified organism: Principal | ICD-10-CM

## 2023-12-22 DIAGNOSIS — K5792 Diverticulitis of intestine, part unspecified, without perforation or abscess without bleeding: Secondary | ICD-10-CM | POA: Insufficient documentation

## 2023-12-22 DIAGNOSIS — Z888 Allergy status to other drugs, medicaments and biological substances status: Secondary | ICD-10-CM

## 2023-12-22 DIAGNOSIS — Z860100 Personal history of colon polyps, unspecified: Secondary | ICD-10-CM | POA: Insufficient documentation

## 2023-12-22 DIAGNOSIS — E669 Obesity, unspecified: Secondary | ICD-10-CM | POA: Diagnosis present

## 2023-12-22 DIAGNOSIS — I700 Atherosclerosis of aorta: Secondary | ICD-10-CM | POA: Diagnosis present

## 2023-12-22 DIAGNOSIS — Z955 Presence of coronary angioplasty implant and graft: Secondary | ICD-10-CM

## 2023-12-22 DIAGNOSIS — Z7902 Long term (current) use of antithrombotics/antiplatelets: Secondary | ICD-10-CM

## 2023-12-22 DIAGNOSIS — Z1152 Encounter for screening for COVID-19: Secondary | ICD-10-CM

## 2023-12-22 DIAGNOSIS — R634 Abnormal weight loss: Secondary | ICD-10-CM | POA: Diagnosis present

## 2023-12-22 DIAGNOSIS — Z6823 Body mass index (BMI) 23.0-23.9, adult: Secondary | ICD-10-CM

## 2023-12-22 LAB — CBC WITH DIFFERENTIAL/PLATELET
Abs Immature Granulocytes: 0.06 K/uL (ref 0.00–0.07)
Basophils Absolute: 0 K/uL (ref 0.0–0.1)
Basophils Relative: 0 %
Eosinophils Absolute: 0 K/uL (ref 0.0–0.5)
Eosinophils Relative: 0 %
HCT: 50.4 % (ref 39.0–52.0)
Hemoglobin: 16.5 g/dL (ref 13.0–17.0)
Immature Granulocytes: 1 %
Lymphocytes Relative: 6 %
Lymphs Abs: 0.7 K/uL (ref 0.7–4.0)
MCH: 30.6 pg (ref 26.0–34.0)
MCHC: 32.7 g/dL (ref 30.0–36.0)
MCV: 93.5 fL (ref 80.0–100.0)
Monocytes Absolute: 0.2 K/uL (ref 0.1–1.0)
Monocytes Relative: 2 %
Neutro Abs: 11.2 K/uL — ABNORMAL HIGH (ref 1.7–7.7)
Neutrophils Relative %: 91 %
Platelets: 240 K/uL (ref 150–400)
RBC: 5.39 MIL/uL (ref 4.22–5.81)
RDW: 13.2 % (ref 11.5–15.5)
WBC: 12.2 K/uL — ABNORMAL HIGH (ref 4.0–10.5)
nRBC: 0 % (ref 0.0–0.2)

## 2023-12-22 LAB — COMPREHENSIVE METABOLIC PANEL WITH GFR
ALT: 10 U/L (ref 0–44)
AST: 21 U/L (ref 15–41)
Albumin: 4.2 g/dL (ref 3.5–5.0)
Alkaline Phosphatase: 58 U/L (ref 38–126)
Anion gap: 24 — ABNORMAL HIGH (ref 5–15)
BUN: 11 mg/dL (ref 8–23)
CO2: 20 mmol/L — ABNORMAL LOW (ref 22–32)
Calcium: 9.9 mg/dL (ref 8.9–10.3)
Chloride: 100 mmol/L (ref 98–111)
Creatinine, Ser: 0.82 mg/dL (ref 0.61–1.24)
GFR, Estimated: >60 mL/min (ref >60)
Glucose, Bld: 146 mg/dL — ABNORMAL HIGH (ref 70–99)
Potassium: 3.2 mmol/L — ABNORMAL LOW (ref 3.5–5.1)
Sodium: 144 mmol/L (ref 135–145)
Total Bilirubin: 0.7 mg/dL (ref 0.0–1.2)
Total Protein: 6.7 g/dL (ref 6.5–8.1)

## 2023-12-22 LAB — BASIC METABOLIC PANEL WITH GFR
Anion gap: 20 — ABNORMAL HIGH (ref 5–15)
BUN: 11 mg/dL (ref 8–23)
CO2: 21 mmol/L — ABNORMAL LOW (ref 22–32)
Calcium: 9.2 mg/dL (ref 8.9–10.3)
Chloride: 102 mmol/L (ref 98–111)
Creatinine, Ser: 0.83 mg/dL (ref 0.61–1.24)
GFR, Estimated: >60 mL/min (ref >60)
Glucose, Bld: 132 mg/dL — ABNORMAL HIGH (ref 70–99)
Potassium: 3.3 mmol/L — ABNORMAL LOW (ref 3.5–5.1)
Sodium: 143 mmol/L (ref 135–145)

## 2023-12-22 LAB — RESP PANEL BY RT-PCR (RSV, FLU A&B, COVID)  RVPGX2
Influenza A by PCR: NEGATIVE
Influenza B by PCR: NEGATIVE
Resp Syncytial Virus by PCR: NEGATIVE
SARS Coronavirus 2 by RT PCR: NEGATIVE

## 2023-12-22 LAB — PROCALCITONIN: Procalcitonin: <0.1 ng/mL

## 2023-12-22 LAB — URINALYSIS, W/ REFLEX TO CULTURE (INFECTION SUSPECTED)
Bacteria, UA: NONE SEEN
Bilirubin Urine: NEGATIVE
Glucose, UA: >=500 mg/dL — AB
Hgb urine dipstick: NEGATIVE
Ketones, ur: 20 mg/dL — AB
Leukocytes,Ua: NEGATIVE
Nitrite: NEGATIVE
Protein, ur: NEGATIVE mg/dL
Specific Gravity, Urine: 1.033 — ABNORMAL HIGH (ref 1.005–1.030)
pH: 5 (ref 5.0–8.0)

## 2023-12-22 LAB — GLUCOSE, CAPILLARY: Glucose-Capillary: 98 mg/dL (ref 70–99)

## 2023-12-22 LAB — I-STAT CG4 LACTIC ACID, ED
Lactic Acid, Venous: 1.4 mmol/L (ref 0.5–1.9)
Lactic Acid, Venous: 2.7 mmol/L (ref 0.5–1.9)

## 2023-12-22 LAB — PROTIME-INR
INR: 1.1 (ref 0.8–1.2)
Prothrombin Time: 14.8 s (ref 11.4–15.2)

## 2023-12-22 LAB — LACTIC ACID, PLASMA
Lactic Acid, Venous: 1.6 mmol/L (ref 0.5–1.9)
Lactic Acid, Venous: 1.4 mmol/L (ref 0.5–1.9)

## 2023-12-22 LAB — MAGNESIUM: Magnesium: <0.5 mg/dL — CL (ref 1.7–2.4)

## 2023-12-22 LAB — TROPONIN T, HIGH SENSITIVITY
Troponin T High Sensitivity: <15 ng/L (ref 0–19)
Troponin T High Sensitivity: <15 ng/L (ref 0–19)

## 2023-12-22 LAB — LIPASE, BLOOD: Lipase: 11 U/L (ref 11–51)

## 2023-12-22 LAB — PHOSPHORUS: Phosphorus: 2.8 mg/dL (ref 2.5–4.6)

## 2023-12-22 MED ORDER — MAGNESIUM SULFATE 4 GM/100ML IV SOLN
4.0000 g | Freq: Once | INTRAVENOUS | Status: AC
Start: 2023-12-22 — End: 2023-12-22
  Administered 2023-12-22: 4 g via INTRAVENOUS
  Filled 2023-12-22: qty 100

## 2023-12-22 MED ORDER — LACTATED RINGERS IV BOLUS (SEPSIS)
1000.0000 mL | Freq: Once | INTRAVENOUS | Status: AC
Start: 2023-12-22 — End: 2023-12-22
  Administered 2023-12-22: 1000 mL via INTRAVENOUS

## 2023-12-22 MED ORDER — ONDANSETRON HCL 4 MG/2ML IJ SOLN
4.0000 mg | Freq: Four times a day (QID) | INTRAMUSCULAR | Status: DC | PRN
  Administered 2023-12-23: 4 mg via INTRAVENOUS
  Filled 2023-12-22: qty 2

## 2023-12-22 MED ORDER — ACETAMINOPHEN 650 MG RE SUPP: 650.0000 mg | Freq: Four times a day (QID) | RECTAL | Status: DC | PRN

## 2023-12-22 MED ORDER — PANTOPRAZOLE SODIUM 40 MG IV SOLR
40.0000 mg | Freq: Once | INTRAVENOUS | Status: AC
  Administered 2023-12-22: 40 mg via INTRAVENOUS
  Filled 2023-12-22: qty 10

## 2023-12-22 MED ORDER — ONDANSETRON HCL 4 MG PO TABS: 4.0000 mg | ORAL_TABLET | Freq: Four times a day (QID) | ORAL | Status: DC | PRN

## 2023-12-22 MED ORDER — SODIUM CHLORIDE 0.9 % IV SOLN
2.0000 g | Freq: Three times a day (TID) | INTRAVENOUS | Status: DC
  Administered 2023-12-22 – 2023-12-23 (×4): 2 g via INTRAVENOUS
  Filled 2023-12-22 (×4): qty 12.5

## 2023-12-22 MED ORDER — VANCOMYCIN HCL IN DEXTROSE 1-5 GM/200ML-% IV SOLN
1000.0000 mg | Freq: Once | INTRAVENOUS | Status: AC
  Administered 2023-12-22: 1000 mg via INTRAVENOUS
  Filled 2023-12-22: qty 200

## 2023-12-22 MED ORDER — METRONIDAZOLE 500 MG/100ML IV SOLN
500.0000 mg | Freq: Two times a day (BID) | INTRAVENOUS | Status: DC
  Administered 2023-12-22 – 2023-12-23 (×2): 500 mg via INTRAVENOUS
  Filled 2023-12-22 (×2): qty 100

## 2023-12-22 MED ORDER — LACTATED RINGERS IV SOLN: INTRAVENOUS | Status: AC

## 2023-12-22 MED ORDER — LACTATED RINGERS IV BOLUS
1000.0000 mL | Freq: Once | INTRAVENOUS | Status: AC
  Administered 2023-12-22: 1000 mL via INTRAVENOUS

## 2023-12-22 MED ORDER — VANCOMYCIN HCL 1750 MG/350ML IV SOLN
1750.0000 mg | INTRAVENOUS | Status: DC
  Administered 2023-12-23: 1750 mg via INTRAVENOUS
  Filled 2023-12-22: qty 350

## 2023-12-22 MED ORDER — METRONIDAZOLE 500 MG/100ML IV SOLN
500.0000 mg | Freq: Once | INTRAVENOUS | Status: AC
  Administered 2023-12-22: 500 mg via INTRAVENOUS
  Filled 2023-12-22: qty 100

## 2023-12-22 MED ORDER — ACETAMINOPHEN 325 MG PO TABS
650.0000 mg | ORAL_TABLET | Freq: Four times a day (QID) | ORAL | Status: DC | PRN
  Administered 2023-12-22 – 2023-12-27 (×4): 650 mg via ORAL
  Filled 2023-12-22 (×4): qty 2

## 2023-12-22 MED ORDER — ONDANSETRON HCL 4 MG/2ML IJ SOLN
4.0000 mg | Freq: Once | INTRAMUSCULAR | Status: AC
Start: 2023-12-22 — End: 2023-12-22
  Administered 2023-12-22: 4 mg via INTRAVENOUS
  Filled 2023-12-22: qty 2

## 2023-12-22 MED ORDER — PROCHLORPERAZINE EDISYLATE 10 MG/2ML IJ SOLN: 10.0000 mg | Freq: Four times a day (QID) | INTRAMUSCULAR | Status: DC | PRN

## 2023-12-22 MED ORDER — IOHEXOL 350 MG/ML SOLN
100.0000 mL | Freq: Once | INTRAVENOUS | Status: AC | PRN
  Administered 2023-12-22: 100 mL via INTRAVENOUS

## 2023-12-22 MED ORDER — POTASSIUM CHLORIDE CRYS ER 20 MEQ PO TBCR
40.0000 meq | EXTENDED_RELEASE_TABLET | Freq: Once | ORAL | Status: AC
  Administered 2023-12-22: 40 meq via ORAL
  Filled 2023-12-22: qty 2

## 2023-12-22 MED ORDER — BOOST / RESOURCE BREEZE PO LIQD CUSTOM
1.0000 | Freq: Three times a day (TID) | ORAL | Status: DC
  Administered 2023-12-22 – 2023-12-23 (×3): 1 via ORAL

## 2023-12-22 MED ORDER — SODIUM CHLORIDE 0.9 % IV SOLN
2.0000 g | Freq: Once | INTRAVENOUS | Status: AC
  Administered 2023-12-22: 2 g via INTRAVENOUS
  Filled 2023-12-22: qty 12.5

## 2023-12-22 NOTE — ED Provider Notes (Signed)
 [facility] AT [facility] Provider Note   CSN: [id number] Arrival date & time: 12/22/23  0321     Patient presents with: No chief complaint on file.   Ray Brown is a 73 y.o. male.   Patient with a history of CAD, diabetes, GERD, hiatal hernia, vertigo, peripheral vascular disease here with nausea, vomiting and weakness.  Reports he has been vomiting every day 2 or 3 times daily and feeling ill for the past 1 week.  Has had sick contacts at home with his grandson.  Vomiting became worse this evening around 7 PM he is not been able to eat or drink anything.  Denies having significant vertigo contrary to triage note.  States he does not have any spinning dizziness currently.  Denies chest pain or shortness of breath.  Denies abdominal pain.  Denies pain with urination or blood in the urine.  Denies cough.  Febrile on arrival to the ED which she was not aware of.  Sugars have been well-controlled at home.  Denies any significant headache.  No visual changes.  States he is not having much vertigo currently.  Does go to physical therapy for this.  Still has appendix and gallbladder.  The history is provided by the patient.       Prior to Admission medications   Medication Sig Start Date End Date Taking? Authorizing Provider  aspirin  EC 81 MG tablet Take 81 mg by mouth at bedtime.    [provider]  clopidogrel  (PLAVIX ) 75 MG tablet Take 1 tablet (75 mg total) by mouth at bedtime. 11/01/23   Lelon Hamilton T, PA-C  fenofibrate  160 MG tablet Take 160 mg by mouth at bedtime.    [provider]  glucose blood (ONETOUCH ULTRA) test strip daily.    [provider]  JARDIANCE 25 MG TABS tablet Take 25 mg by mouth daily.    [provider]  Lancets MISC See admin instructions. 02/02/18   [provider]  meclizine (ANTIVERT) 25 MG tablet Take 25 mg by mouth as needed for dizziness or nausea. 06/23/22   [provider]  metoprolol  succinate (TOPROL -XL) 25 MG 24 hr tablet Take 1 tablet (25 mg total) by mouth daily. 11/01/23   Lelon Hamilton T, PA-C  Multiple Vitamins-Minerals (MULTIVITAMIN ADULT PO) Take 1 tablet by mouth daily.     [provider]  nitroGLYCERIN  (NITROSTAT ) 0.4 MG SL tablet Place 0.4 mg under the tongue every 5 (five) minutes as needed for chest pain.    [provider]  ondansetron  (ZOFRAN ) 4 MG tablet Take 4-8 mg by mouth 3 (three) times daily as needed. 06/26/23   [provider]  ONETOUCH ULTRA test strip CHECK BLOOD SUGAR ONCE DAILY 01/20/21   [provider]  pantoprazole  (PROTONIX ) 40 MG tablet Take 40 mg by mouth at bedtime.    [provider]  polyethylene glycol (MIRALAX  / GLYCOLAX ) 17 g packet Take 17 g by mouth 2 (two) times daily. 01/01/19   Akula, Vijaya, MD  rosuvastatin (CRESTOR) 20 MG tablet Take 20 mg by mouth daily. 12/10/20   [provider]    Allergies: Augmentin [amoxicillin-pot clavulanate], Glipizide, Lipitor  [atorvastatin ], Metformin, Sulfa antibiotics, Ciprofloxacin, and Oxaprozin    Review of Systems  Constitutional:  Positive for activity change, appetite change, fatigue and fever.  HENT:  Negative for congestion and rhinorrhea.   Respiratory:  Negative for cough, chest tightness and shortness of breath.   Cardiovascular:  Negative  for chest pain.  Gastrointestinal:  Positive for nausea and vomiting. Negative for abdominal pain.  Genitourinary:  Negative for dysuria and hematuria.  Musculoskeletal:  Negative for arthralgias and myalgias.  Skin:  Negative for rash.  Neurological:  Positive for dizziness, weakness and light-headedness.   all other systems are negative except as noted in the HPI and PMH.    Updated Vital Signs BP 135/74   Pulse (!) 125   Temp (!) 100.5 F (38.1 C) (Rectal)   Resp 14   SpO2 92%   Physical Exam Vitals and nursing note reviewed.  Constitutional:       General: He is not in acute distress.    Appearance: He is well-developed. He is ill-appearing.  HENT:     Head: Normocephalic and atraumatic.     Mouth/Throat:     Mouth: Mucous membranes are dry.     Pharynx: No oropharyngeal exudate.  Eyes:     Conjunctiva/sclera: Conjunctivae normal.     Pupils: Pupils are equal, round, and reactive to light.  Neck:     Comments: No meningismus. Cardiovascular:     Rate and Rhythm: Regular rhythm. Tachycardia present.     Heart sounds: Normal heart sounds. No murmur heard. Pulmonary:     Effort: Pulmonary effort is normal. No respiratory distress.     Breath sounds: Normal breath sounds.  Abdominal:     Palpations: Abdomen is soft.     Tenderness: There is no abdominal tenderness. There is no guarding or rebound.  Musculoskeletal:        General: No tenderness. Normal range of motion.     Cervical back: Normal range of motion and neck supple.  Skin:    General: Skin is warm.  Neurological:     Mental Status: He is alert and oriented to person, place, and time.     Cranial Nerves: No cranial nerve deficit.     Motor: No abnormal muscle tone.     Coordination: Coordination normal.     Comments:  5/5 strength throughout. CN 2-12 intact.Equal grip strength.  No nystagmus.  No ataxia finger-to-nose  Psychiatric:        Behavior: Behavior normal.     (all labs ordered are listed, but only abnormal results are displayed) Labs Reviewed  CBC WITH DIFFERENTIAL/PLATELET - Abnormal; Notable for the following components:      Result Value   WBC 12.2 (*)    Neutro Abs 11.2 (*)    All other components within normal limits  COMPREHENSIVE METABOLIC PANEL WITH GFR - Abnormal; Notable for the following components:   Potassium 3.2 (*)    CO2 20 (*)    Glucose, Bld 146 (*)    Anion gap 24 (*)    All other components within normal limits  URINALYSIS, W/ REFLEX TO CULTURE (INFECTION SUSPECTED) - Abnormal; Notable for the following components:    Specific Gravity, Urine 1.033 (*)    Glucose, UA >=500 (*)    Ketones, ur 20 (*)    All other components within normal limits  I-STAT CG4 LACTIC ACID, ED - Abnormal; Notable for the following components:   Lactic Acid, Venous 2.7 (*)    All other components within normal limits  RESP PANEL BY RT-PCR (RSV, FLU A&B, COVID)  RVPGX2  CULTURE, BLOOD (ROUTINE X 2)  CULTURE, BLOOD (ROUTINE X 2)  PROTIME-INR  LACTIC ACID, PLASMA  LACTIC ACID, PLASMA  MAGNESIUM   PHOSPHORUS  BASIC METABOLIC PANEL WITH GFR  I-STAT CG4 LACTIC  ACID, ED  TROPONIN T, HIGH SENSITIVITY  TROPONIN T, HIGH SENSITIVITY    EKG: EKG Interpretation Date/Time:  Friday December 22 2023 03:31:37 EDT Ventricular Rate:  124 PR Interval:  168 QRS Duration:  96 QT Interval:  325 QTC Calculation: 467 R Axis:   140  Text Interpretation: Sinus tachycardia Left posterior fascicular block Abnormal R-wave progression, late transition Borderline T wave abnormalities Rate faster Confirmed by Carita Senior [phone]) on 12/22/2023 3:33:48 AM  Radiology: CT ABDOMEN PELVIS W CONTRAST Result Date: 12/22/2023 EXAM: CT ABDOMEN AND PELVIS WITH CONTRAST 12/22/2023 07:25:18 AM TECHNIQUE: CT of the abdomen and pelvis was performed with the administration of 100 mL of iohexol  (OMNIPAQUE ) 350 MG/ML injection. Multiplanar reformatted images are provided for review. Automated exposure control, iterative reconstruction, and/or weight-based adjustment of the mA/kV was utilized to reduce the radiation dose to as low as reasonably achievable. COMPARISON: 07/11/2023 CLINICAL HISTORY: Abdominal pain, acute, nonlocalized. FINDINGS: LOWER CHEST: Scattered coronary and aortic plaque. LIVER: The liver is unremarkable. GALLBLADDER AND BILE DUCTS: Gallbladder is unremarkable. No biliary ductal dilatation. SPLEEN: No acute abnormality. PANCREAS: The inflammatory changes around the pancreatic tail seen previously have resolved. ADRENAL GLANDS: No acute  abnormality. KIDNEYS, URETERS AND BLADDER: Multiple cortical lesions in kidneys, some of which can be characterized as simple cysts, largest 2.1 cm 15 HU right upper pole. Per consensus, no follow-up is needed for simple Bosniak type 1 and 2 renal cysts, unless the patient has a malignancy history or risk factors. No stones in the kidneys or ureters. No hydronephrosis. No perinephric or periureteral stranding. Urinary bladder is unremarkable. GI AND BOWEL: Stomach demonstrates no acute abnormality. Scattered sigmoid diverticula without adjacent inflammatory/edematous change. Anastomatic staple line near the rectosigmoid junction. Normal appendix. There is no bowel obstruction. PERITONEUM AND RETROPERITONEUM: No ascites. No free air. VASCULATURE: Aorta is normal in caliber. Patent aortobifem graft. LYMPH NODES: No lymphadenopathy. REPRODUCTIVE ORGANS: Moderate prostate enlargement. BONES AND SOFT TISSUES: Solid-appearing fusion L2-L5. Moderate adjacent level DJD and degenerative disc disease L1-L2. No acute osseous abnormality. No focal soft tissue abnormality. IMPRESSION: 1. No acute findings in the abdomen or pelvis. 2. Resolution of prior inflammatory changes around the pancreatic tail. 3. Moderate prostatomegaly. 4. Scattered sigmoid diverticulosis without evidence of diverticulitis. 5. Patent aortic graft. Electronically signed by: Katheleen Faes MD 12/22/2023 08:43 AM EDT RP Workstation: [id number]   CT Angio Chest PE W and/or Wo Contrast Result Date: 12/22/2023 EXAM: CTA of the Chest with contrast for PE 12/22/2023 07:25:18 AM TECHNIQUE: CTA of the chest was performed after the administration of intravenous contrast. Multiplanar reformatted images are provided for review. MIP images are provided for review. Automated exposure control, iterative reconstruction, and/or weight based adjustment of the mA/kV was utilized to reduce the radiation dose to as low as reasonably achievable. COMPARISON: 01/31/2019  CLINICAL HISTORY: Pulmonary embolism (PE) suspected, low to intermediate probability, positive D-dimer. FINDINGS: PULMONARY ARTERIES: Pulmonary arteries are adequately opacified for evaluation. No pulmonary embolism. Main pulmonary artery is normal in caliber. MEDIASTINUM: Scattered coronary atheromatous calcifications. The heart and pericardium demonstrate no acute abnormality. Scattered aortic atheromatous calcifications. Bovine variant aortic arch branch anatomy. Mild stenosis in the proximal left subclavian artery. There is no acute abnormality of the thoracic aorta. LYMPH NODES: No mediastinal, hilar or axillary lymphadenopathy. LUNGS AND PLEURA: Pulmonary emphysema. No focal consolidation or pulmonary edema. No pleural effusion or pneumothorax. UPPER ABDOMEN: Limited images of the upper abdomen are unremarkable. SOFT TISSUES AND BONES: Vertebral endplate spurring at multiple levels in the lower  thoracic spine. No acute soft tissue abnormality. IMPRESSION: 1. No evidence of pulmonary embolism. 2. Coronary and aortic plaque. 3. Pulmonary emphysema; consider evaluation for low-dose CT lung cancer screening if age 24. Electronically signed by: Katheleen Faes MD 12/22/2023 08:37 AM EDT RP Workstation: [id number]   CT Head Wo Contrast Result Date: 12/22/2023 EXAM: CT HEAD WITHOUT CONTRAST 12/22/2023 07:25:18 AM TECHNIQUE: CT of the head was performed without the administration of intravenous contrast. Automated exposure control, iterative reconstruction, and/or weight based adjustment of the mA/kV was utilized to reduce the radiation dose to as low as reasonably achievable. COMPARISON: MRI head 07/24/2023 CLINICAL HISTORY: Headache, new onset (Age >= 51y) FINDINGS: BRAIN AND VENTRICLES: No acute hemorrhage. No evidence of acute infarct. No hydrocephalus. No extra-axial collection. No mass effect or midline shift. ORBITS: No acute abnormality. SINUSES: No acute abnormality. SOFT TISSUES AND SKULL: No acute soft  tissue abnormality. No skull fracture. IMPRESSION: 1. No acute intracranial abnormality. Electronically signed by: Gilmore Molt MD 12/22/2023 07:43 AM EDT RP Workstation: [id number]   DG Chest Portable 1 View Result Date: 12/22/2023 EXAM: 1 VIEW(S) XRAY OF THE CHEST 12/22/2023 04:19:00 AM COMPARISON: PA Chest with Rib Series 07/08/2019. CLINICAL HISTORY: sepsis FINDINGS: LUNGS AND PLEURA: There is a low inspiration on exam. The hypoexpanded lungs are generally clear. No focal pulmonary opacity. No pulmonary edema. No pleural effusion. No pneumothorax. HEART AND MEDIASTINUM: Stable mediastinum with aortic atherosclerosis. The cardiac size is normal. BONES AND SOFT TISSUES: There is thoracic spondylosis. No acute osseous abnormality. Overlying telemetry leads. IMPRESSION: 1. No acute cardiopulmonary radiographic findings. 2. Hypoinflated, with limited view of the bases. Electronically signed by: Francis Quam MD 12/22/2023 04:28 AM EDT RP Workstation: [id number]     Procedures   Medications Ordered in the ED  lactated ringers  infusion (has no administration in time range)  lactated ringers  bolus 1,000 mL (has no administration in time range)  ceFEPIme  (MAXIPIME ) 2 g in sodium chloride  0.9 % 100 mL IVPB (has no administration in time range)  metroNIDAZOLE  (FLAGYL ) IVPB 500 mg (has no administration in time range)  vancomycin (VANCOCIN) IVPB 1000 mg/200 mL premix (has no administration in time range)                                    Medical Decision Making Amount and/or Complexity of Data Reviewed Independent Historian: EMS Labs: ordered. Decision-making details documented in ED Course. Radiology: ordered and independent interpretation performed. Decision-making details documented in ED Course. ECG/medicine tests: ordered and independent interpretation performed. Decision-making details documented in ED Course.  Risk Prescription drug management. Decision regarding  hospitalization.   Patient from home with nausea and vomiting x 1 week progressively worsening.  Tachycardic and febrile on arrival.  Code sepsis activated.  Abdomen soft without peritoneal signs. Recently switched to jardiance. ? Pancreatitis but no significant abdominal pain.  Did not take his metoprolol  last night.   Patient given IV fluids and broad-spectrum antibiotics after cultures are obtained.  No focal neurological deficits.  No nystagmus or ataxia on finger-to-nose.  Lactate normal.  Leukocytosis noted.  Also with hemoconcentration.  Remains tachycardic likely in secondary to dehydration not having his beta-blocker last night. CXR negative for pneumonia. UA negative for infection. Covid/flu negative.   CT imaging performed given his intractable nausea and vomiting and SIRS pathology.   CT head negative for hemorrhage or central process. Low suspicion for TIA or CVA.  No PE  or pneumonia.  Diverticulosis without diverticulitis. No acute findings in the abdomen or pelvis.   Unclear source of persistent tachycardia, fever, and sepsis pathology. Consider viral illness.  Remains tachycardic after 2 liters of IVF and lactate rising to 2.7. Additional IVF ordered.   He continues to feel poorly and would benefit from admission for ongoing given his fever, tachycardia and unclear source of infection. No meningismus or headache or neck stiffness. Doubt meningitis.   Admission d/w Dr. Charlton.      Final diagnoses:  None    ED Discharge Orders     None          Hamlin Devine, Garnette, MD 12/22/23 1013

## 2023-12-22 NOTE — H&P (Signed)
 History and Physical    Patient: Ray Brown FMW:[id number] DOB: 01-03-1951 DOA: 12/22/2023 DOS: the patient was seen and examined on 12/22/2023 PCP: Loreli Kins, MD  Patient coming from: Home  Chief Complaint: Nausea and vomiting for a week.  HPI: Ray Brown is a 73 y.o. male with medical history significant of osteoarthritis, spinal stenosis, bronchitis, chronic back pain, CAD, history of unstable angina episode, hypertension, history of hypertensive heart disease, hyperlipidemia, aortic atherosclerosis, peripheral vascular disease, history of pneumonia, sleep apnea on CPAP, moderate protein malnutrition, history of unspecified class obesity, type 2 diabetes, GERD, hiatal hernia, Barrett's esophagus, diverticulosis/diverticulitis, constipation,, colon polyp, history of BPH who presents emergency department complaints of nausea and numerous episodes of emesis since Thursday of last week.   No diarrhea, constipation, melena or hematochezia.  No flank pain, dysuria, frequency or hematuria.  Review of systems also positive for postural dizziness, had a headache a few days ago, fatigue and mild dyspnea. He denied fever, chills, rhinorrhea, sore throat, wheezing or hemoptysis.  No chest pain, palpitations, diaphoresis, PND, orthopnea or pitting edema of the lower extremities. No polyuria, polydipsia, polyphagia or blurred vision.   ED course: Initial vital signs were temperature 99.5 F, pulse 70.5 transpiration 14, BP 135/74 mmHg and O2 sat 92% on room air.  The patient received 2000 mL of LR bolus, cefepime , metronidazole  and vancomycin.  Lab work: Urinalysis showed specific gravity 1.033, greater than 500 glucose and ketones of 20 mg/dL.  Coronavirus, influenza and RSV PCR was negative.  CBC showed a white count of 12.2, hemoglobin 16.5 g/dL and platelets 759.  Normal PT and INR.  Lactic acid was 1.4 and then 2.7 mmol/L.  Troponin x 2 was normal.  CMP showed a glucose of 146 mg/dL,  potassium of 3.2 and CO2 of 20 mmol/L with an anion gap of 24.  The rest of the electrolytes, hepatic and renal function were normal.  Imaging: Portable 1 view chest radiograph was hypoinflated with no acute cardiopulmonary findings.  CT head without contrast no acute intracranial abnormality.  CTA chest   Review of Systems: As mentioned in the history of present illness. All other systems reviewed and are negative. Past Medical History:  Diagnosis Date   Arthritis    Bronchitis    Chronic back pain    Complication of anesthesia    difficulty breathing s/p back surgery at [facility] 2013   Coronary artery disease    Diabetes mellitus without complication (HCC)    Dry skin    in the back of head-left side   GERD (gastroesophageal reflux disease)    H/O hiatal hernia    Hypercholesteremia    Peripheral vascular disease    Pneumonia    Shortness of breath    Sleep apnea    has CPAP but does not use   Unstable angina (HCC) 12/05/2014   Past Surgical History:  Procedure Laterality Date   AORTA - BILATERAL FEMORAL ARTERY BYPASS GRAFT  11/27/2001   Dr Krystal Early.  Aortobifemoral bypass with a 14 x 8 Hemashield graft.   BACK SURGERY  2004   CARDIAC CATHETERIZATION  2010   x 2 stents RCA   CARDIAC CATHETERIZATION N/A 12/08/2014   Procedure: Left Heart Cath and Coronary Angiography;  Surgeon: Dorn JINNY Lesches, MD;  Location: [facility] INVASIVE CV LAB;  Service: Cardiovascular;  Laterality: N/A;   COLONOSCOPY  08/04/2009   CORONARY ANGIOPLASTY     2 stents   FOOT SURGERY  1980s   right  foot   LYSIS OF ADHESION N/A 01/16/2019   Procedure: LYSIS OF ADHESIONS;  Surgeon: Sheldon Standing, MD;  Location: [facility] ORS;  Service: General;  Laterality: N/A;   PROCTOSCOPY N/A 01/16/2019   Procedure: RIGID PROCTOSCOPY;  Surgeon: Sheldon Standing, MD;  Location: [facility];  Service: General;  Laterality: N/A;   SHOULDER ARTHROSCOPY WITH SUBACROMIAL DECOMPRESSION Right 03/28/2013   Procedure: RIGHT SHOULDER ARTHROSCOPY WITH  SUBACROMIAL DECOMPRESSION AND DISTAL CLAVICLE RESECTION;  Surgeon: Franky CHRISTELLA Pointer, MD;  Location: [facility] OR;  Service: Orthopedics;  Laterality: Right;   SHOULDER SURGERY     left shoulder rotator cuff repair   UPPER GI ENDOSCOPY     XI ROBOTIC ASSISTED LOWER ANTERIOR RESECTION N/A 01/16/2019   Procedure: XI ROBOTIC RESECTION OF SIGMOID COLON;  Surgeon: Sheldon Standing, MD;  Location: [facility];  Service: General;  Laterality: N/A;   Social History:  reports that he has quit smoking. His smoking use included cigarettes. He has never used smokeless tobacco. He reports that he does not drink alcohol and does not use drugs.  Allergies  Allergen Reactions   Augmentin [Amoxicillin-Pot Clavulanate] Nausea And Vomiting   Glipizide Other (See Comments)   Lipitor  [Atorvastatin ]     Muscle pain   Metformin Nausea And Vomiting   Sulfa Antibiotics Dermatitis   Ciprofloxacin Rash   Oxaprozin Rash    Other Reaction(s): Not available    Family History  Problem Relation Age of Onset   Colon cancer Mother    Heart attack Father        , massive   Coronary artery disease Brother    Thyroid cancer Brother     Prior to Admission medications   Medication Sig Start Date End Date Taking? Authorizing Provider  aspirin  EC 81 MG tablet Take 81 mg by mouth at bedtime.   Yes [provider]  clopidogrel  (PLAVIX ) 75 MG tablet Take 1 tablet (75 mg total) by mouth at bedtime. 11/01/23  Yes Weaver, Scott T, PA-C  fenofibrate  160 MG tablet Take 160 mg by mouth at bedtime.   Yes [provider]  JARDIANCE 25 MG TABS tablet Take 25 mg by mouth daily.   Yes [provider]  meclizine (ANTIVERT) 25 MG tablet Take 25 mg by mouth as needed for dizziness or nausea. 06/23/22  Yes [provider]  metoprolol  succinate (TOPROL -XL) 25 MG 24 hr tablet Take 1 tablet (25 mg total) by mouth daily. 11/01/23  Yes Weaver, Scott T, PA-C  Multiple Vitamins-Minerals (MULTIVITAMIN ADULT PO) Take 1 tablet by  mouth daily.    Yes [provider]  nitroGLYCERIN  (NITROSTAT ) 0.4 MG SL tablet Place 0.4 mg under the tongue every 5 (five) minutes as needed for chest pain.   Yes [provider]  pantoprazole  (PROTONIX ) 40 MG tablet Take 40 mg by mouth at bedtime.   Yes [provider]  rosuvastatin (CRESTOR) 20 MG tablet Take 20 mg by mouth daily. 12/10/20  Yes [provider]  glucose blood (ONETOUCH ULTRA) test strip daily.    [provider]  Lancets MISC See admin instructions. 02/02/18   [provider]  ondansetron  (ZOFRAN ) 4 MG tablet Take 4-8 mg by mouth 3 (three) times daily as needed. Patient not taking: Reported on 12/22/2023 06/26/23   [provider]  ONETOUCH ULTRA test strip CHECK BLOOD SUGAR ONCE DAILY 01/20/21   [provider]  polyethylene glycol (MIRALAX  / GLYCOLAX ) 17 g packet Take 17 g by mouth 2 (two) times daily. Patient  not taking: Reported on 12/22/2023 01/01/19   Cherlyn Labella, MD    Physical Exam: Vitals:   12/22/23 0400 12/22/23 0600 12/22/23 0729 12/22/23 0743  BP: 121/81 125/77    Pulse: (!) 122 (!) 129    Resp: (!) 25 (!) 24    Temp:   99.4 F (37.4 C) 100.3 F (37.9 C)  TempSrc:   Oral Rectal  SpO2: 94% 94%     Physical Exam Vitals reviewed.  Constitutional:      General: He is awake. He is not in acute distress.    Appearance: He is normal weight. He is ill-appearing.  HENT:     Head: Normocephalic.     Nose: No rhinorrhea.     Mouth/Throat:     Mouth: Mucous membranes are dry.  Eyes:     General: No scleral icterus.    Pupils: Pupils are equal, round, and reactive to light.  Neck:     Vascular: No JVD.  Cardiovascular:     Rate and Rhythm: Regular rhythm. Tachycardia present.     Heart sounds: S1 normal and S2 normal.  Pulmonary:     Effort: Pulmonary effort is normal.     Breath sounds: Normal breath sounds. No wheezing, rhonchi or rales.  Abdominal:     General: Bowel sounds  are normal. There is no distension.     Palpations: Abdomen is soft.     Tenderness: There is no abdominal tenderness. There is no right CVA tenderness or left CVA tenderness.  Musculoskeletal:     Cervical back: Neck supple.     Right lower leg: No edema.     Left lower leg: No edema.  Skin:    General: Skin is warm and dry.  Neurological:     General: No focal deficit present.     Mental Status: He is alert and oriented to person, place, and time.  Psychiatric:        Mood and Affect: Mood normal.        Behavior: Behavior normal. Behavior is cooperative.     Data Reviewed:  Results are pending, will review when available.  09/28/2018 echocardiogram report. IMPRESSIONS:   1. The left ventricle has normal systolic function with an ejection  fraction of 60-65%. The cavity size was normal. There is mildly increased  left ventricular wall thickness. Left ventricular diastolic Doppler  parameters are consistent with impaired  relaxation.   2. The right ventricle has normal systolic function. The cavity was  normal. There is no increase in right ventricular wall thickness. Right  ventricular systolic pressure could not be assessed.   3. The aortic valve is tricuspid. Moderate sclerosis of the aortic valve.  No stenosis of the aortic valve.   EKG: Vent. rate 124 BPM PR interval 168 ms QRS duration 96 ms QT/QTcB 325/467 ms P-R-T axes 80 140 -11 Sinus tachycardia Left posterior fascicular block Abnormal R-wave progression, late transition Borderline T wave abnormalities  Assessment and Plan: Principal Problem:   Intractable nausea and vomiting Superimposed on:   Gastroesophageal reflux disease without esophagitis Associated with:   Barrett's esophagus Admit to PCU/inpatient. Continue IV fluids. Follow-up CBC and chemistry in the morning.  Active Problems:   Hypokalemia Replacing. Follow level in AM.    Hypomagnesemia Supplemented. Follow magnesium  level in  the morning.    Essential hypertension Has had significant weight loss. Currently not on antihypertensives.    Peripheral vascular disease Will hold DAPT for now. - In case repeat endoscopic  studies needed.    Dyslipidemia Will resume statin tomorrow.    OSA (obstructive sleep apnea) Currently not using CPAP    Advance Care Planning:   Code Status: Full Code   Consults: [facility] Douglas Boots, MD).  Family Communication:   Severity of Illness: The appropriate patient status for this patient is OBSERVATION. Observation status is judged to be reasonable and necessary in order to provide the required intensity of service to ensure the patient's safety. The patient's presenting symptoms, physical exam findings, and initial radiographic and laboratory data in the context of their medical condition is felt to place them at decreased risk for further clinical deterioration. Furthermore, it is anticipated that the patient will be medically stable for discharge from the hospital within 2 midnights of admission.   Author: Alm Dorn Castor, MD 12/22/2023 7:46 AM  For on call review [email or web address].   This document was prepared using Dragon voice recognition software and may contain some unintended transcription errors.

## 2023-12-22 NOTE — Sepsis Progress Note (Signed)
 Elink monitoring for the code sepsis protocol.

## 2023-12-22 NOTE — Consult Note (Signed)
 Reason for Consult: Nausea periodic vomiting Referring Physician: Hospital team  Ray Brown is an 73 y.o. male.  HPI: Patient seen and examined and his hospital computer chart and our office computer chart was reviewed and his case discussed with his wife and he has vertigo for years but his nausea has been bad for about 3 months and it was about the time he started on Jardiance and he has not started any other medicines around that time however he was put on meclizine recently and he does not vomit all food when he vomits he usually just gags or spits up some clear fluid and he had a recent colonoscopy and endoscopy and CT scan and he will have some good days which he cannot say why and he has not had any sick contacts but presented for increased vomiting over the last 24 hours but seems to be better now and requests some clear liquids and he has lost about 20 pounds over this period and he has not had any pain with any of this  Past Medical History:  Diagnosis Date   Arthritis    Bronchitis    Chronic back pain    Complication of anesthesia    difficulty breathing s/p back surgery at [facility] 2013   Coronary artery disease    Diabetes mellitus without complication (HCC)    Dry skin    in the back of head-left side   GERD (gastroesophageal reflux disease)    H/O hiatal hernia    Hypercholesteremia    Peripheral vascular disease    Pneumonia    Shortness of breath    Sleep apnea    has CPAP but does not use   Unstable angina (HCC) 12/05/2014    Past Surgical History:  Procedure Laterality Date   AORTA - BILATERAL FEMORAL ARTERY BYPASS GRAFT  11/27/2001   Dr Krystal Early.  Aortobifemoral bypass with a 14 x 8 Hemashield graft.   BACK SURGERY  2004   CARDIAC CATHETERIZATION  2010   x 2 stents RCA   CARDIAC CATHETERIZATION N/A 12/08/2014   Procedure: Left Heart Cath and Coronary Angiography;  Surgeon: Dorn JINNY Lesches, MD;  Location: [facility] INVASIVE CV LAB;  Service: Cardiovascular;   Laterality: N/A;   COLONOSCOPY  08/04/2009   CORONARY ANGIOPLASTY     2 stents   FOOT SURGERY  1980s   right foot   LYSIS OF ADHESION N/A 01/16/2019   Procedure: LYSIS OF ADHESIONS;  Surgeon: Sheldon Standing, MD;  Location: [facility] ORS;  Service: General;  Laterality: N/A;   PROCTOSCOPY N/A 01/16/2019   Procedure: RIGID PROCTOSCOPY;  Surgeon: Sheldon Standing, MD;  Location: [facility] ORS;  Service: General;  Laterality: N/A;   SHOULDER ARTHROSCOPY WITH SUBACROMIAL DECOMPRESSION Right 03/28/2013   Procedure: RIGHT SHOULDER ARTHROSCOPY WITH SUBACROMIAL DECOMPRESSION AND DISTAL CLAVICLE RESECTION;  Surgeon: Franky CHRISTELLA Pointer, MD;  Location: [facility] OR;  Service: Orthopedics;  Laterality: Right;   SHOULDER SURGERY     left shoulder rotator cuff repair   UPPER GI ENDOSCOPY     XI ROBOTIC ASSISTED LOWER ANTERIOR RESECTION N/A 01/16/2019   Procedure: XI ROBOTIC RESECTION OF SIGMOID COLON;  Surgeon: Sheldon Standing, MD;  Location: [facility];  Service: General;  Laterality: N/A;    Family History  Problem Relation Age of Onset   Colon cancer Mother    Heart attack Father        , massive   Coronary artery disease Brother    Thyroid cancer Brother  Social History:  reports that he has quit smoking. His smoking use included cigarettes. He has never used smokeless tobacco. He reports that he does not drink alcohol and does not use drugs.  Allergies:  Allergies  Allergen Reactions   Augmentin [Amoxicillin-Pot Clavulanate] Nausea And Vomiting   Glipizide Other (See Comments)   Lipitor  [Atorvastatin ]     Muscle pain   Metformin Nausea And Vomiting   Sulfa Antibiotics Dermatitis   Ciprofloxacin Rash   Oxaprozin Rash    Other Reaction(s): Not available    Medications: I have reviewed the patient's current medications.  Results for orders placed or performed during the hospital encounter of 12/22/23 (from the past 48 hours)  Urinalysis, w/ Reflex to Culture (Infection Suspected) -Urine, Clean Catch     Status:  Abnormal   Collection Time: 12/22/23  3:34 AM  Result Value Ref Range   Specimen Source URINE, CLEAN CATCH    Color, Urine YELLOW YELLOW   APPearance CLEAR CLEAR   Specific Gravity, Urine 1.033 (H) 1.005 - 1.030   pH 5.0 5.0 - 8.0   Glucose, UA >=500 (A) NEGATIVE mg/dL   Hgb urine dipstick NEGATIVE NEGATIVE   Bilirubin Urine NEGATIVE NEGATIVE   Ketones, ur 20 (A) NEGATIVE mg/dL   Protein, ur NEGATIVE NEGATIVE mg/dL   Nitrite NEGATIVE NEGATIVE   Leukocytes,Ua NEGATIVE NEGATIVE   RBC / HPF 0-5 0 - 5 RBC/hpf   WBC, UA 0-5 0 - 5 WBC/hpf    Comment:        Reflex urine culture not performed if WBC <=10, OR if Squamous epithelial cells >5. If Squamous epithelial cells >5 suggest recollection.    Bacteria, UA NONE SEEN NONE SEEN   Squamous Epithelial / HPF 0-5 0 - 5 /HPF   Mucus PRESENT     Comment: Performed at [facility], [address] [place], [place] [address]  Resp panel by RT-PCR (RSV, Flu A&B, Covid)     Status: None   Collection Time: 12/22/23  3:47 AM   Specimen: Nasal Swab  Result Value Ref Range   SARS Coronavirus 2 by RT PCR NEGATIVE NEGATIVE    Comment: (NOTE) SARS-CoV-2 target nucleic acids are NOT DETECTED.  The SARS-CoV-2 RNA is generally detectable in upper respiratory specimens during the acute phase of infection. The lowest concentration of SARS-CoV-2 viral copies this assay can detect is 138 copies/mL. A negative result does not preclude SARS-Cov-2 infection and should not be used as the sole basis for treatment or other patient management decisions. A negative result may occur with  improper specimen collection/handling, submission of specimen other than nasopharyngeal swab, presence of viral mutation(s) within the areas targeted by this assay, and inadequate number of viral copies(<138 copies/mL). A negative result must be combined with clinical observations, patient history, and epidemiological information. The expected result is  Negative.  Fact Sheet for Patients:  bloggercourse.com  Fact Sheet for Healthcare Providers:  seriousbroker.it  This test is no t yet approved or cleared by the [place]  FDA and  has been authorized for detection and/or diagnosis of SARS-CoV-2 by FDA under an Emergency Use Authorization (EUA). This EUA will remain  in effect (meaning this test can be used) for the duration of the COVID-19 declaration under Section 564(b)(1) of the Act, 21 U.S.C.section 360bbb-3(b)(1), unless the authorization is terminated  or revoked sooner.       Influenza A by PCR NEGATIVE NEGATIVE   Influenza B by PCR NEGATIVE NEGATIVE  Comment: (NOTE) The Xpert Xpress SARS-CoV-2/FLU/RSV plus assay is intended as an aid in the diagnosis of influenza from Nasopharyngeal swab specimens and should not be used as a sole basis for treatment. Nasal washings and aspirates are unacceptable for Xpert Xpress SARS-CoV-2/FLU/RSV testing.  Fact Sheet for Patients: bloggercourse.com  Fact Sheet for Healthcare Providers: seriousbroker.it  This test is not yet approved or cleared by the [place]  FDA and has been authorized for detection and/or diagnosis of SARS-CoV-2 by FDA under an Emergency Use Authorization (EUA). This EUA will remain in effect (meaning this test can be used) for the duration of the COVID-19 declaration under Section 564(b)(1) of the Act, 21 U.S.C. section 360bbb-3(b)(1), unless the authorization is terminated or revoked.     Resp Syncytial Virus by PCR NEGATIVE NEGATIVE    Comment: (NOTE) Fact Sheet for Patients: bloggercourse.com  Fact Sheet for Healthcare Providers: seriousbroker.it  This test is not yet approved or cleared by the [place]  FDA and has been authorized for detection and/or diagnosis of SARS-CoV-2 by FDA under an  Emergency Use Authorization (EUA). This EUA will remain in effect (meaning this test can be used) for the duration of the COVID-19 declaration under Section 564(b)(1) of the Act, 21 U.S.C. section 360bbb-3(b)(1), unless the authorization is terminated or revoked.  Performed at [facility], [address], [place], [place] [address]   Protime-INR     Status: None   Collection Time: 12/22/23  3:47 AM  Result Value Ref Range   Prothrombin Time 14.8 11.4 - 15.2 seconds   INR 1.1 0.8 - 1.2    Comment: (NOTE) INR goal varies based on device and disease states. Performed at [facility], [address] [place], [place] [address]   CBC with Differential     Status: Abnormal   Collection Time: 12/22/23  3:58 AM  Result Value Ref Range   WBC 12.2 (H) 4.0 - 10.5 K/uL   RBC 5.39 4.22 - 5.81 MIL/uL   Hemoglobin 16.5 13.0 - 17.0 g/dL   HCT 49.5 60.9 - 47.9 %   MCV 93.5 80.0 - 100.0 fL   MCH 30.6 26.0 - 34.0 pg   MCHC 32.7 30.0 - 36.0 g/dL   RDW 86.7 88.4 - 84.4 %   Platelets 240 150 - 400 K/uL   nRBC 0.0 0.0 - 0.2 %   Neutrophils Relative % 91 %   Neutro Abs 11.2 (H) 1.7 - 7.7 K/uL   Lymphocytes Relative 6 %   Lymphs Abs 0.7 0.7 - 4.0 K/uL   Monocytes Relative 2 %   Monocytes Absolute 0.2 0.1 - 1.0 K/uL   Eosinophils Relative 0 %   Eosinophils Absolute 0.0 0.0 - 0.5 K/uL   Basophils Relative 0 %   Basophils Absolute 0.0 0.0 - 0.1 K/uL   Immature Granulocytes 1 %   Abs Immature Granulocytes 0.06 0.00 - 0.07 K/uL    Comment: Performed at [facility], [address], [place], [place] [address]  Comprehensive metabolic panel     Status: Abnormal   Collection Time: 12/22/23  3:58 AM  Result Value Ref Range   Sodium 144 135 - 145 mmol/L    Comment: Electrolytes repeated to verify    Potassium 3.2 (L) 3.5 - 5.1 mmol/L   Chloride 100 98 - 111 mmol/L   CO2 20 (L) 22 - 32 mmol/L   Glucose, Bld 146 (H) 70 - 99 mg/dL    Comment:  Glucose reference range  applies only to samples taken after fasting for at least 8 hours.   BUN 11 8 - 23 mg/dL   Creatinine, Ser 9.17 0.61 - 1.24 mg/dL   Calcium  9.9 8.9 - 10.3 mg/dL   Total Protein 6.7 6.5 - 8.1 g/dL   Albumin 4.2 3.5 - 5.0 g/dL   AST 21 15 - 41 U/L   ALT 10 0 - 44 U/L   Alkaline Phosphatase 58 38 - 126 U/L   Total Bilirubin 0.7 0.0 - 1.2 mg/dL   GFR, Estimated >39 >39 mL/min    Comment: (NOTE) Calculated using the CKD-EPI Creatinine Equation (2021)    Anion gap 24 (H) 5 - 15    Comment: Performed at [facility], [address], [place], [place] [address]  Troponin T, High Sensitivity     Status: None   Collection Time: 12/22/23  3:58 AM  Result Value Ref Range   Troponin T High Sensitivity <15 0 - 19 ng/L    Comment: (NOTE) Biotin concentrations > 1000 ng/mL falsely decrease TnT results.  Serial cardiac troponin measurements are suggested.  Refer to the Links section for chest pain algorithms and additional  guidance. Performed at [facility], [address], [place], [place] [address]   Troponin T, High Sensitivity     Status: None   Collection Time: 12/22/23  3:58 AM  Result Value Ref Range   Troponin T High Sensitivity <15 0 - 19 ng/L    Comment: (NOTE) Biotin concentrations > 1000 ng/mL falsely decrease TnT results.  Serial cardiac troponin measurements are suggested.  Refer to the Links section for chest pain algorithms and additional  guidance. Performed at [facility], [address] [place], [place] [address]   I-Stat Lactic Acid, ED     Status: None   Collection Time: 12/22/23  4:04 AM  Result Value Ref Range   Lactic Acid, Venous 1.4 0.5 - 1.9 mmol/L  I-Stat Lactic Acid, ED     Status: Abnormal   Collection Time: 12/22/23  6:22 AM  Result Value Ref Range   Lactic Acid, Venous 2.7 (HH) 0.5 - 1.9 mmol/L   Comment NOTIFIED PHYSICIAN   Magnesium      Status: Abnormal   Collection  Time: 12/22/23  9:00 AM  Result Value Ref Range   Magnesium  <0.5 (LL) 1.7 - 2.4 mg/dL    Comment: Critical Value, Read Back and verified with BARBER, M. RN AT 1051 ON 10.31.25. FA Performed at [facility], [address], [place], [place] [address]   Phosphorus     Status: None   Collection Time: 12/22/23  9:00 AM  Result Value Ref Range   Phosphorus 2.8 2.5 - 4.6 mg/dL    Comment: Performed at [facility], [address] [place], [place] [address]  Basic metabolic panel     Status: Abnormal   Collection Time: 12/22/23  9:00 AM  Result Value Ref Range   Sodium 143 135 - 145 mmol/L   Potassium 3.3 (L) 3.5 - 5.1 mmol/L   Chloride 102 98 - 111 mmol/L   CO2 21 (L) 22 - 32 mmol/L   Glucose, Bld 132 (H) 70 - 99 mg/dL    Comment: Glucose reference range applies only to samples taken after fasting for at least 8 hours.   BUN 11 8 - 23 mg/dL   Creatinine, Ser 9.16 0.61 - 1.24 mg/dL   Calcium  9.2 8.9 - 10.3 mg/dL  GFR, Estimated >60 >60 mL/min    Comment: (NOTE) Calculated using the CKD-EPI Creatinine Equation (2021)    Anion gap 20 (H) 5 - 15    Comment: Performed at [facility], [address], [place], [place] [address]  Lipase, blood     Status: None   Collection Time: 12/22/23  9:00 AM  Result Value Ref Range   Lipase 11 11 - 51 U/L    Comment: Performed at [facility], [address] [place], [place] [address]  Lactic acid, plasma     Status: None   Collection Time: 12/22/23  9:31 AM  Result Value Ref Range   Lactic Acid, Venous 1.6 0.5 - 1.9 mmol/L    Comment: Performed at [facility], [address], [place], [place] [address]  Lactic acid, plasma     Status: None   Collection Time: 12/22/23 12:54 PM  Result Value Ref Range   Lactic Acid, Venous 1.4 0.5 - 1.9 mmol/L    Comment: Performed at [facility], [address] [place], [place] [address]    CT  ABDOMEN PELVIS W CONTRAST Result Date: 12/22/2023 EXAM: CT ABDOMEN AND PELVIS WITH CONTRAST 12/22/2023 07:25:18 AM TECHNIQUE: CT of the abdomen and pelvis was performed with the administration of 100 mL of iohexol  (OMNIPAQUE ) 350 MG/ML injection. Multiplanar reformatted images are provided for review. Automated exposure control, iterative reconstruction, and/or weight-based adjustment of the mA/kV was utilized to reduce the radiation dose to as low as reasonably achievable. COMPARISON: 07/11/2023 CLINICAL HISTORY: Abdominal pain, acute, nonlocalized. FINDINGS: LOWER CHEST: Scattered coronary and aortic plaque. LIVER: The liver is unremarkable. GALLBLADDER AND BILE DUCTS: Gallbladder is unremarkable. No biliary ductal dilatation. SPLEEN: No acute abnormality. PANCREAS: The inflammatory changes around the pancreatic tail seen previously have resolved. ADRENAL GLANDS: No acute abnormality. KIDNEYS, URETERS AND BLADDER: Multiple cortical lesions in kidneys, some of which can be characterized as simple cysts, largest 2.1 cm 15 HU right upper pole. Per consensus, no follow-up is needed for simple Bosniak type 1 and 2 renal cysts, unless the patient has a malignancy history or risk factors. No stones in the kidneys or ureters. No hydronephrosis. No perinephric or periureteral stranding. Urinary bladder is unremarkable. GI AND BOWEL: Stomach demonstrates no acute abnormality. Scattered sigmoid diverticula without adjacent inflammatory/edematous change. Anastomatic staple line near the rectosigmoid junction. Normal appendix. There is no bowel obstruction. PERITONEUM AND RETROPERITONEUM: No ascites. No free air. VASCULATURE: Aorta is normal in caliber. Patent aortobifem graft. LYMPH NODES: No lymphadenopathy. REPRODUCTIVE ORGANS: Moderate prostate enlargement. BONES AND SOFT TISSUES: Solid-appearing fusion L2-L5. Moderate adjacent level DJD and degenerative disc disease L1-L2. No acute osseous abnormality. No focal soft  tissue abnormality. IMPRESSION: 1. No acute findings in the abdomen or pelvis. 2. Resolution of prior inflammatory changes around the pancreatic tail. 3. Moderate prostatomegaly. 4. Scattered sigmoid diverticulosis without evidence of diverticulitis. 5. Patent aortic graft. Electronically signed by: Katheleen Faes MD 12/22/2023 08:43 AM EDT RP Workstation: [id number]   CT Angio Chest PE W and/or Wo Contrast Result Date: 12/22/2023 EXAM: CTA of the Chest with contrast for PE 12/22/2023 07:25:18 AM TECHNIQUE: CTA of the chest was performed after the administration of intravenous contrast. Multiplanar reformatted images are provided for review. MIP images are provided for review. Automated exposure control, iterative reconstruction, and/or weight based adjustment of the mA/kV was utilized to reduce the radiation dose to as low as reasonably achievable. COMPARISON: 01/31/2019 CLINICAL HISTORY: Pulmonary embolism (PE) suspected, low  to intermediate probability, positive D-dimer. FINDINGS: PULMONARY ARTERIES: Pulmonary arteries are adequately opacified for evaluation. No pulmonary embolism. Main pulmonary artery is normal in caliber. MEDIASTINUM: Scattered coronary atheromatous calcifications. The heart and pericardium demonstrate no acute abnormality. Scattered aortic atheromatous calcifications. Bovine variant aortic arch branch anatomy. Mild stenosis in the proximal left subclavian artery. There is no acute abnormality of the thoracic aorta. LYMPH NODES: No mediastinal, hilar or axillary lymphadenopathy. LUNGS AND PLEURA: Pulmonary emphysema. No focal consolidation or pulmonary edema. No pleural effusion or pneumothorax. UPPER ABDOMEN: Limited images of the upper abdomen are unremarkable. SOFT TISSUES AND BONES: Vertebral endplate spurring at multiple levels in the lower thoracic spine. No acute soft tissue abnormality. IMPRESSION: 1. No evidence of pulmonary embolism. 2. Coronary and aortic plaque. 3. Pulmonary  emphysema; consider evaluation for low-dose CT lung cancer screening if age 59. Electronically signed by: Katheleen Faes MD 12/22/2023 08:37 AM EDT RP Workstation: [id number]   CT Head Wo Contrast Result Date: 12/22/2023 EXAM: CT HEAD WITHOUT CONTRAST 12/22/2023 07:25:18 AM TECHNIQUE: CT of the head was performed without the administration of intravenous contrast. Automated exposure control, iterative reconstruction, and/or weight based adjustment of the mA/kV was utilized to reduce the radiation dose to as low as reasonably achievable. COMPARISON: MRI head 07/24/2023 CLINICAL HISTORY: Headache, new onset (Age >= 51y) FINDINGS: BRAIN AND VENTRICLES: No acute hemorrhage. No evidence of acute infarct. No hydrocephalus. No extra-axial collection. No mass effect or midline shift. ORBITS: No acute abnormality. SINUSES: No acute abnormality. SOFT TISSUES AND SKULL: No acute soft tissue abnormality. No skull fracture. IMPRESSION: 1. No acute intracranial abnormality. Electronically signed by: Gilmore Molt MD 12/22/2023 07:43 AM EDT RP Workstation: [id number]   DG Chest Portable 1 View Result Date: 12/22/2023 EXAM: 1 VIEW(S) XRAY OF THE CHEST 12/22/2023 04:19:00 AM COMPARISON: PA Chest with Rib Series 07/08/2019. CLINICAL HISTORY: sepsis FINDINGS: LUNGS AND PLEURA: There is a low inspiration on exam. The hypoexpanded lungs are generally clear. No focal pulmonary opacity. No pulmonary edema. No pleural effusion. No pneumothorax. HEART AND MEDIASTINUM: Stable mediastinum with aortic atherosclerosis. The cardiac size is normal. BONES AND SOFT TISSUES: There is thoracic spondylosis. No acute osseous abnormality. Overlying telemetry leads. IMPRESSION: 1. No acute cardiopulmonary radiographic findings. 2. Hypoinflated, with limited view of the bases. Electronically signed by: Francis Quam MD 12/22/2023 04:28 AM EDT RP Workstation: [id number]    ROS negative except above Blood pressure (!) 106/51, pulse (!) 112,  temperature 98.6 F (37 C), temperature source Oral, resp. rate 16, height 5' 8 (1.727 m), weight 71 kg, SpO2 95%. Physical Exam vital signs stable afebrile no acute distress abdomen is soft nontender labs actually not too bad lipase normal albumin 4.2 CBC okay BUN and creatinine normal Assessment/Plan: Nausea with periodic more dry of heaves and vomiting questionable due to Jardiance Plan: Hold Jardiance may be seeing endocrinologist as an outpatient consider gastric emptying or even down the road a CCK HIDA just to make sure gallbladder is not playing a role and my partner will round on this weekend and hopefully have more suggestions and we will go ahead and start clear liquids and see how she does  Jariel Drost E 12/22/2023, 4:18 PM

## 2023-12-22 NOTE — Plan of Care (Incomplete)
  Problem: Education: Goal: Knowledge of General Education information will improve Description: Including pain rating scale, medication(s)/side effects and non-pharmacologic comfort measures Outcome: Progressing   Problem: Health Behavior/Discharge Planning: Goal: Ability to manage health-related needs will improve Outcome: Progressing   Problem: Clinical Measurements: Goal: Diagnostic test results will improve Outcome: Progressing   Problem: Activity: Goal: Risk for activity intolerance will decrease Outcome: Progressing   Problem: Nutrition: Goal: Adequate nutrition will be maintained Outcome: Progressing   Problem: Elimination: Goal: Will not experience complications related to urinary retention Outcome: Progressing   Problem: Safety: Goal: Ability to remain free from injury will improve Outcome: Progressing   Problem: Clinical Measurements: Goal: Respiratory complications will improve Outcome: Adequate for Discharge Goal: Cardiovascular complication will be avoided Outcome: Adequate for Discharge   Problem: Coping: Goal: Level of anxiety will decrease Outcome: Adequate for Discharge   Problem: Elimination: Goal: Will not experience complications related to bowel motility Outcome: Adequate for Discharge   Problem: Pain Managment: Goal: General experience of comfort will improve and/or be controlled Outcome: Adequate for Discharge   Problem: Skin Integrity: Goal: Risk for impaired skin integrity will decrease Outcome: Adequate for Discharge

## 2023-12-22 NOTE — ED Triage Notes (Signed)
 Pt BIB [facility] from home. Pt Hx vertigo. Pt family reports pt symptoms worse today. Pt family reports pt having nausea and vomiting. Pt unable to keep anything down since Thursday 26RR 138/86BP 126HR 98% RA 20CAP 120CBG  20G LH

## 2023-12-22 NOTE — Progress Notes (Signed)
 Pharmacy Antibiotic Note  Ray Brown is a 73 y.o. male admitted on 12/22/2023 with sepsis. Pharmacy has been consulted for vancomycin and cefepime  dosing.  One-time doses of vancomycin, cefepime , and Flagyl  were given in the ED earlier today. CT a/p with no acute findings--sigmoid diverticulosis without evidence diverticulitis.   Plan: Start cefepime  2g IV q8hrs Start vancomycin 1750mg  IV q24h (eAUC 510 using Scr 0.83, Vd 0.72) Vanc levels PRN Monitor cultures, renal function, and overall clinical picture   Height: 5' 8 (172.7 cm) Weight: 71 kg (156 lb 8.4 oz) IBW/kg (Calculated) : 68.4  Temp (24hrs), Avg:99.7 F (37.6 C), Min:98.6 F (37 C), Max:100.5 F (38.1 C)  Recent Labs  Lab 12/22/23 0358 12/22/23 0404 12/22/23 0622 12/22/23 0900 12/22/23 0931 12/22/23 1254  WBC 12.2*  --   --   --   --   --   CREATININE 0.82  --   --  0.83  --   --   LATICACIDVEN  --  1.4 2.7*  --  1.6 1.4    Estimated Creatinine Clearance: 75.5 mL/min (by C-G formula based on SCr of 0.83 mg/dL).    Allergies  Allergen Reactions   Augmentin [Amoxicillin-Pot Clavulanate] Nausea And Vomiting   Glipizide Other (See Comments)   Lipitor  [Atorvastatin ]     Muscle pain   Metformin Nausea And Vomiting   Sulfa Antibiotics Dermatitis   Ciprofloxacin Rash   Oxaprozin Rash    Other Reaction(s): Not available    Antimicrobials this admission: 10/31 Flagyl  >>  10/31 cefepime  >>  10/31 vancomycin >>   Dose adjustments this admission: N/A  Microbiology results: 10/31 BCx: in process 10/31 resp panel: neg   Thank you for allowing pharmacy to be a part of this patient's care.  Lacinda Moats, PharmD Clinical Pharmacist  10/31/20254:00 PM

## 2023-12-22 NOTE — ED Notes (Signed)
 Pt. I-stat CG4 Lactic Acid results 2.7. EDP, Rancour and RN, Medford made aware.

## 2023-12-22 NOTE — Sepsis Progress Note (Signed)
 Notified provider of need to order repeat lactic acid.

## 2023-12-23 DIAGNOSIS — E876 Hypokalemia: Secondary | ICD-10-CM | POA: Diagnosis present

## 2023-12-23 DIAGNOSIS — K2270 Barrett's esophagus without dysplasia: Secondary | ICD-10-CM | POA: Diagnosis present

## 2023-12-23 DIAGNOSIS — Z7902 Long term (current) use of antithrombotics/antiplatelets: Secondary | ICD-10-CM | POA: Diagnosis not present

## 2023-12-23 DIAGNOSIS — E785 Hyperlipidemia, unspecified: Secondary | ICD-10-CM

## 2023-12-23 DIAGNOSIS — R42 Dizziness and giddiness: Secondary | ICD-10-CM | POA: Diagnosis present

## 2023-12-23 DIAGNOSIS — K219 Gastro-esophageal reflux disease without esophagitis: Secondary | ICD-10-CM | POA: Diagnosis not present

## 2023-12-23 DIAGNOSIS — F05 Delirium due to known physiological condition: Secondary | ICD-10-CM | POA: Diagnosis present

## 2023-12-23 DIAGNOSIS — R112 Nausea with vomiting, unspecified: Secondary | ICD-10-CM | POA: Diagnosis present

## 2023-12-23 DIAGNOSIS — R4182 Altered mental status, unspecified: Secondary | ICD-10-CM | POA: Diagnosis not present

## 2023-12-23 DIAGNOSIS — R443 Hallucinations, unspecified: Secondary | ICD-10-CM | POA: Diagnosis not present

## 2023-12-23 DIAGNOSIS — E440 Moderate protein-calorie malnutrition: Secondary | ICD-10-CM | POA: Diagnosis present

## 2023-12-23 DIAGNOSIS — E872 Acidosis, unspecified: Secondary | ICD-10-CM | POA: Diagnosis present

## 2023-12-23 DIAGNOSIS — J439 Emphysema, unspecified: Secondary | ICD-10-CM | POA: Diagnosis present

## 2023-12-23 DIAGNOSIS — E7800 Pure hypercholesterolemia, unspecified: Secondary | ICD-10-CM | POA: Diagnosis present

## 2023-12-23 DIAGNOSIS — Z1152 Encounter for screening for COVID-19: Secondary | ICD-10-CM | POA: Diagnosis not present

## 2023-12-23 DIAGNOSIS — I10 Essential (primary) hypertension: Secondary | ICD-10-CM

## 2023-12-23 DIAGNOSIS — E1151 Type 2 diabetes mellitus with diabetic peripheral angiopathy without gangrene: Secondary | ICD-10-CM | POA: Diagnosis present

## 2023-12-23 DIAGNOSIS — K5730 Diverticulosis of large intestine without perforation or abscess without bleeding: Secondary | ICD-10-CM | POA: Diagnosis present

## 2023-12-23 DIAGNOSIS — F32A Depression, unspecified: Secondary | ICD-10-CM | POA: Diagnosis present

## 2023-12-23 DIAGNOSIS — E8809 Other disorders of plasma-protein metabolism, not elsewhere classified: Secondary | ICD-10-CM | POA: Diagnosis present

## 2023-12-23 DIAGNOSIS — I739 Peripheral vascular disease, unspecified: Secondary | ICD-10-CM

## 2023-12-23 DIAGNOSIS — I119 Hypertensive heart disease without heart failure: Secondary | ICD-10-CM | POA: Diagnosis present

## 2023-12-23 DIAGNOSIS — R569 Unspecified convulsions: Secondary | ICD-10-CM | POA: Diagnosis not present

## 2023-12-23 DIAGNOSIS — E669 Obesity, unspecified: Secondary | ICD-10-CM | POA: Diagnosis present

## 2023-12-23 DIAGNOSIS — I700 Atherosclerosis of aorta: Secondary | ICD-10-CM | POA: Diagnosis present

## 2023-12-23 DIAGNOSIS — K2950 Unspecified chronic gastritis without bleeding: Secondary | ICD-10-CM | POA: Diagnosis present

## 2023-12-23 DIAGNOSIS — F419 Anxiety disorder, unspecified: Secondary | ICD-10-CM | POA: Diagnosis present

## 2023-12-23 DIAGNOSIS — G4733 Obstructive sleep apnea (adult) (pediatric): Secondary | ICD-10-CM | POA: Diagnosis present

## 2023-12-23 DIAGNOSIS — F22 Delusional disorders: Secondary | ICD-10-CM | POA: Diagnosis present

## 2023-12-23 LAB — COMPREHENSIVE METABOLIC PANEL WITH GFR
ALT: 7 U/L (ref 0–44)
AST: 25 U/L (ref 15–41)
Albumin: 3.3 g/dL — ABNORMAL LOW (ref 3.5–5.0)
Alkaline Phosphatase: 44 U/L (ref 38–126)
Anion gap: 13 (ref 5–15)
BUN: 15 mg/dL (ref 8–23)
CO2: 23 mmol/L (ref 22–32)
Calcium: 8.6 mg/dL — ABNORMAL LOW (ref 8.9–10.3)
Chloride: 103 mmol/L (ref 98–111)
Creatinine, Ser: 0.88 mg/dL (ref 0.61–1.24)
GFR, Estimated: >60 mL/min (ref >60)
Glucose, Bld: 93 mg/dL (ref 70–99)
Potassium: 4.3 mmol/L (ref 3.5–5.1)
Sodium: 138 mmol/L (ref 135–145)
Total Bilirubin: 0.9 mg/dL (ref 0.0–1.2)
Total Protein: 5.3 g/dL — ABNORMAL LOW (ref 6.5–8.1)

## 2023-12-23 LAB — CBC WITH DIFFERENTIAL/PLATELET
Abs Immature Granulocytes: 0.04 K/uL (ref 0.00–0.07)
Basophils Absolute: 0 K/uL (ref 0.0–0.1)
Basophils Relative: 0 %
Eosinophils Absolute: 0.1 K/uL (ref 0.0–0.5)
Eosinophils Relative: 1 %
HCT: 41.9 % (ref 39.0–52.0)
Hemoglobin: 13.4 g/dL (ref 13.0–17.0)
Immature Granulocytes: 0 %
Lymphocytes Relative: 14 %
Lymphs Abs: 1.5 K/uL (ref 0.7–4.0)
MCH: 30.7 pg (ref 26.0–34.0)
MCHC: 32 g/dL (ref 30.0–36.0)
MCV: 96.1 fL (ref 80.0–100.0)
Monocytes Absolute: 0.7 K/uL (ref 0.1–1.0)
Monocytes Relative: 7 %
Neutro Abs: 8.6 K/uL — ABNORMAL HIGH (ref 1.7–7.7)
Neutrophils Relative %: 78 %
Platelets: 198 K/uL (ref 150–400)
RBC: 4.36 MIL/uL (ref 4.22–5.81)
RDW: 13.1 % (ref 11.5–15.5)
WBC: 10.9 K/uL — ABNORMAL HIGH (ref 4.0–10.5)
nRBC: 0 % (ref 0.0–0.2)

## 2023-12-23 LAB — GLUCOSE, CAPILLARY
Glucose-Capillary: 87 mg/dL (ref 70–99)
Glucose-Capillary: 115 mg/dL — ABNORMAL HIGH (ref 70–99)
Glucose-Capillary: 158 mg/dL — ABNORMAL HIGH (ref 70–99)
Glucose-Capillary: 154 mg/dL — ABNORMAL HIGH (ref 70–99)

## 2023-12-23 LAB — PROCALCITONIN: Procalcitonin: <0.1 ng/mL

## 2023-12-23 LAB — MAGNESIUM: Magnesium: 1.4 mg/dL — ABNORMAL LOW (ref 1.7–2.4)

## 2023-12-23 MED ORDER — MELATONIN 5 MG PO TABS
5.0000 mg | ORAL_TABLET | Freq: Every evening | ORAL | Status: DC | PRN
  Administered 2023-12-23 – 2023-12-25 (×2): 5 mg via ORAL
  Filled 2023-12-23 (×2): qty 1

## 2023-12-23 MED ORDER — CLOPIDOGREL BISULFATE 75 MG PO TABS: 75.0000 mg | ORAL_TABLET | Freq: Every day | ORAL | Status: DC

## 2023-12-23 MED ORDER — ASPIRIN 81 MG PO TBEC
81.0000 mg | DELAYED_RELEASE_TABLET | Freq: Every day | ORAL | Status: DC
  Administered 2023-12-23 – 2023-12-27 (×5): 81 mg via ORAL
  Filled 2023-12-23 (×5): qty 1

## 2023-12-23 MED ORDER — METOPROLOL SUCCINATE ER 50 MG PO TB24
25.0000 mg | ORAL_TABLET | Freq: Every day | ORAL | Status: DC
Start: 2023-12-23 — End: 2023-12-28
  Administered 2023-12-23 – 2023-12-28 (×6): 25 mg via ORAL
  Filled 2023-12-23 (×6): qty 1

## 2023-12-23 MED ORDER — MAGNESIUM SULFATE 4 GM/100ML IV SOLN
4.0000 g | Freq: Once | INTRAVENOUS | Status: AC
  Administered 2023-12-23: 4 g via INTRAVENOUS
  Filled 2023-12-23: qty 100

## 2023-12-23 MED ORDER — SCOPOLAMINE 1 MG/3DAYS TD PT72
1.0000 | MEDICATED_PATCH | TRANSDERMAL | Status: DC
  Administered 2023-12-23 – 2023-12-26 (×2): 1 mg via TRANSDERMAL
  Filled 2023-12-23 (×3): qty 1

## 2023-12-23 MED ORDER — LACTATED RINGERS IV SOLN: INTRAVENOUS | Status: AC

## 2023-12-23 MED ORDER — ADULT MULTIVITAMIN W/MINERALS CH
1.0000 | ORAL_TABLET | Freq: Every day | ORAL | Status: DC
  Administered 2023-12-23 – 2023-12-28 (×5): 1 via ORAL
  Filled 2023-12-23 (×6): qty 1

## 2023-12-23 MED ORDER — MECLIZINE HCL 25 MG PO TABS
25.0000 mg | ORAL_TABLET | ORAL | Status: DC | PRN
  Administered 2023-12-23: 25 mg via ORAL
  Filled 2023-12-23: qty 1

## 2023-12-23 MED ORDER — NITROGLYCERIN 0.4 MG SL SUBL: 0.4000 mg | SUBLINGUAL_TABLET | SUBLINGUAL | Status: DC | PRN

## 2023-12-23 MED ORDER — FENOFIBRATE 160 MG PO TABS
160.0000 mg | ORAL_TABLET | Freq: Every day | ORAL | Status: DC
  Administered 2023-12-23 – 2023-12-27 (×5): 160 mg via ORAL
  Filled 2023-12-23 (×5): qty 1

## 2023-12-23 MED ORDER — ENSURE PLUS HIGH PROTEIN PO LIQD
237.0000 mL | Freq: Three times a day (TID) | ORAL | Status: DC
  Administered 2023-12-24 – 2023-12-27 (×5): 237 mL via ORAL

## 2023-12-23 MED ORDER — ROSUVASTATIN CALCIUM 20 MG PO TABS
20.0000 mg | ORAL_TABLET | Freq: Every day | ORAL | Status: DC
  Administered 2023-12-23 – 2023-12-28 (×6): 20 mg via ORAL
  Filled 2023-12-23 (×6): qty 1

## 2023-12-23 MED ORDER — LORAZEPAM 2 MG/ML IJ SOLN
1.0000 mg | Freq: Once | INTRAMUSCULAR | Status: AC
  Administered 2023-12-23: 1 mg via INTRAVENOUS
  Filled 2023-12-23: qty 1

## 2023-12-23 NOTE — Progress Notes (Signed)
 Subjective: Patient complains of continued nausea, associated with retching, not vomiting, associated with dizziness. As per patient and his wife he has lost 40 to 45 pounds in the last 1 year.  Objective: Vital signs in last 24 hours: Temp:  [98.5 F (36.9 C)-100.1 F (37.8 C)] 98.5 F (36.9 C) (11/01 0025) Pulse Rate:  [75-127] 75 (11/01 0025) Resp:  [16-29] 20 (11/01 0025) BP: (94-149)/(51-73) 113/63 (11/01 0025) SpO2:  [92 %-97 %] 92 % (11/01 0025) Weight:  [71 kg] 71 kg (10/31 1251) Weight change:  Last BM Date : 12/20/23  PE: Flushed erythematous face GENERAL: No pallor, nonicteric  ABDOMEN: Soft, mild left upper quadrant abdominal tenderness, nondistended, normoactive bowel sounds EXTREMITIES: No deformity  Lab Results: Results for orders placed or performed during the hospital encounter of 12/22/23 (from the past 48 hours)  Urinalysis, w/ Reflex to Culture (Infection Suspected) -Urine, Clean Catch     Status: Abnormal   Collection Time: 12/22/23  3:34 AM  Result Value Ref Range   Specimen Source URINE, CLEAN CATCH    Color, Urine YELLOW YELLOW   APPearance CLEAR CLEAR   Specific Gravity, Urine 1.033 (H) 1.005 - 1.030   pH 5.0 5.0 - 8.0   Glucose, UA >=500 (A) NEGATIVE mg/dL   Hgb urine dipstick NEGATIVE NEGATIVE   Bilirubin Urine NEGATIVE NEGATIVE   Ketones, ur 20 (A) NEGATIVE mg/dL   Protein, ur NEGATIVE NEGATIVE mg/dL   Nitrite NEGATIVE NEGATIVE   Leukocytes,Ua NEGATIVE NEGATIVE   RBC / HPF 0-5 0 - 5 RBC/hpf   WBC, UA 0-5 0 - 5 WBC/hpf    Comment:        Reflex urine culture not performed if WBC <=10, OR if Squamous epithelial cells >5. If Squamous epithelial cells >5 suggest recollection.    Bacteria, UA NONE SEEN NONE SEEN   Squamous Epithelial / HPF 0-5 0 - 5 /HPF   Mucus PRESENT     Comment: Performed at [facility], [address], [place], [place] [address]  Resp panel by RT-PCR (RSV, Flu A&B, Covid)     Status: None    Collection Time: 12/22/23  3:47 AM   Specimen: Nasal Swab  Result Value Ref Range   SARS Coronavirus 2 by RT PCR NEGATIVE NEGATIVE    Comment: (NOTE) SARS-CoV-2 target nucleic acids are NOT DETECTED.  The SARS-CoV-2 RNA is generally detectable in upper respiratory specimens during the acute phase of infection. The lowest concentration of SARS-CoV-2 viral copies this assay can detect is 138 copies/mL. A negative result does not preclude SARS-Cov-2 infection and should not be used as the sole basis for treatment or other patient management decisions. A negative result may occur with  improper specimen collection/handling, submission of specimen other than nasopharyngeal swab, presence of viral mutation(s) within the areas targeted by this assay, and inadequate number of viral copies(<138 copies/mL). A negative result must be combined with clinical observations, patient history, and epidemiological information. The expected result is Negative.  Fact Sheet for Patients:  bloggercourse.com  Fact Sheet for Healthcare Providers:  seriousbroker.it  This test is no t yet approved or cleared by the [place]  FDA and  has been authorized for detection and/or diagnosis of SARS-CoV-2 by FDA under an Emergency Use Authorization (EUA). This EUA will remain  in effect (meaning this test can be used) for the duration of the COVID-19 declaration under Section 564(b)(1) of the Act, 21 U.S.C.section 360bbb-3(b)(1), unless the authorization is terminated  or revoked sooner.  Influenza A by PCR NEGATIVE NEGATIVE   Influenza B by PCR NEGATIVE NEGATIVE    Comment: (NOTE) The Xpert Xpress SARS-CoV-2/FLU/RSV plus assay is intended as an aid in the diagnosis of influenza from Nasopharyngeal swab specimens and should not be used as a sole basis for treatment. Nasal washings and aspirates are unacceptable for Xpert Xpress  SARS-CoV-2/FLU/RSV testing.  Fact Sheet for Patients: bloggercourse.com  Fact Sheet for Healthcare Providers: seriousbroker.it  This test is not yet approved or cleared by the [place]  FDA and has been authorized for detection and/or diagnosis of SARS-CoV-2 by FDA under an Emergency Use Authorization (EUA). This EUA will remain in effect (meaning this test can be used) for the duration of the COVID-19 declaration under Section 564(b)(1) of the Act, 21 U.S.C. section 360bbb-3(b)(1), unless the authorization is terminated or revoked.     Resp Syncytial Virus by PCR NEGATIVE NEGATIVE    Comment: (NOTE) Fact Sheet for Patients: bloggercourse.com  Fact Sheet for Healthcare Providers: seriousbroker.it  This test is not yet approved or cleared by the [place]  FDA and has been authorized for detection and/or diagnosis of SARS-CoV-2 by FDA under an Emergency Use Authorization (EUA). This EUA will remain in effect (meaning this test can be used) for the duration of the COVID-19 declaration under Section 564(b)(1) of the Act, 21 U.S.C. section 360bbb-3(b)(1), unless the authorization is terminated or revoked.  Performed at [facility], [address], [place], [place] [address]   Protime-INR     Status: None   Collection Time: 12/22/23  3:47 AM  Result Value Ref Range   Prothrombin Time 14.8 11.4 - 15.2 seconds   INR 1.1 0.8 - 1.2    Comment: (NOTE) INR goal varies based on device and disease states. Performed at [facility], [address] [place], [place] [address]   CBC with Differential     Status: Abnormal   Collection Time: 12/22/23  3:58 AM  Result Value Ref Range   WBC 12.2 (H) 4.0 - 10.5 K/uL   RBC 5.39 4.22 - 5.81 MIL/uL   Hemoglobin 16.5 13.0 - 17.0 g/dL   HCT 49.5 60.9 - 47.9 %   MCV 93.5 80.0 - 100.0 fL   MCH 30.6  26.0 - 34.0 pg   MCHC 32.7 30.0 - 36.0 g/dL   RDW 86.7 88.4 - 84.4 %   Platelets 240 150 - 400 K/uL   nRBC 0.0 0.0 - 0.2 %   Neutrophils Relative % 91 %   Neutro Abs 11.2 (H) 1.7 - 7.7 K/uL   Lymphocytes Relative 6 %   Lymphs Abs 0.7 0.7 - 4.0 K/uL   Monocytes Relative 2 %   Monocytes Absolute 0.2 0.1 - 1.0 K/uL   Eosinophils Relative 0 %   Eosinophils Absolute 0.0 0.0 - 0.5 K/uL   Basophils Relative 0 %   Basophils Absolute 0.0 0.0 - 0.1 K/uL   Immature Granulocytes 1 %   Abs Immature Granulocytes 0.06 0.00 - 0.07 K/uL    Comment: Performed at [facility], [address], [place], [place] [address]  Comprehensive metabolic panel     Status: Abnormal   Collection Time: 12/22/23  3:58 AM  Result Value Ref Range   Sodium 144 135 - 145 mmol/L    Comment: Electrolytes repeated to verify    Potassium 3.2 (L) 3.5 - 5.1 mmol/L   Chloride 100 98 - 111 mmol/L   CO2 20 (L) 22 - 32 mmol/L  Glucose, Bld 146 (H) 70 - 99 mg/dL    Comment: Glucose reference range applies only to samples taken after fasting for at least 8 hours.   BUN 11 8 - 23 mg/dL   Creatinine, Ser 9.17 0.61 - 1.24 mg/dL   Calcium  9.9 8.9 - 10.3 mg/dL   Total Protein 6.7 6.5 - 8.1 g/dL   Albumin 4.2 3.5 - 5.0 g/dL   AST 21 15 - 41 U/L   ALT 10 0 - 44 U/L   Alkaline Phosphatase 58 38 - 126 U/L   Total Bilirubin 0.7 0.0 - 1.2 mg/dL   GFR, Estimated >39 >39 mL/min    Comment: (NOTE) Calculated using the CKD-EPI Creatinine Equation (2021)    Anion gap 24 (H) 5 - 15    Comment: Performed at [facility], [address], [place], [place] [address]  Troponin T, High Sensitivity     Status: None   Collection Time: 12/22/23  3:58 AM  Result Value Ref Range   Troponin T High Sensitivity <15 0 - 19 ng/L    Comment: (NOTE) Biotin concentrations > 1000 ng/mL falsely decrease TnT results.  Serial cardiac troponin measurements are suggested.  Refer to the Links section for chest pain  algorithms and additional  guidance. Performed at [facility], [address], [place], [place] [address]   Troponin T, High Sensitivity     Status: None   Collection Time: 12/22/23  3:58 AM  Result Value Ref Range   Troponin T High Sensitivity <15 0 - 19 ng/L    Comment: (NOTE) Biotin concentrations > 1000 ng/mL falsely decrease TnT results.  Serial cardiac troponin measurements are suggested.  Refer to the Links section for chest pain algorithms and additional  guidance. Performed at [facility], [address], [place], [place] [address]   I-Stat Lactic Acid, ED     Status: None   Collection Time: 12/22/23  4:04 AM  Result Value Ref Range   Lactic Acid, Venous 1.4 0.5 - 1.9 mmol/L  I-Stat Lactic Acid, ED     Status: Abnormal   Collection Time: 12/22/23  6:22 AM  Result Value Ref Range   Lactic Acid, Venous 2.7 (HH) 0.5 - 1.9 mmol/L   Comment NOTIFIED PHYSICIAN   Magnesium      Status: Abnormal   Collection Time: 12/22/23  9:00 AM  Result Value Ref Range   Magnesium  <0.5 (LL) 1.7 - 2.4 mg/dL    Comment: Critical Value, Read Back and verified with BARBER, M. RN AT 1051 ON 10.31.25. FA Performed at [facility], [address], [place], [place] [address]   Phosphorus     Status: None   Collection Time: 12/22/23  9:00 AM  Result Value Ref Range   Phosphorus 2.8 2.5 - 4.6 mg/dL    Comment: Performed at [facility], [address], [place], [place] [address]  Basic metabolic panel     Status: Abnormal   Collection Time: 12/22/23  9:00 AM  Result Value Ref Range   Sodium 143 135 - 145 mmol/L   Potassium 3.3 (L) 3.5 - 5.1 mmol/L   Chloride 102 98 - 111 mmol/L   CO2 21 (L) 22 - 32 mmol/L   Glucose, Bld 132 (H) 70 - 99 mg/dL    Comment: Glucose reference range applies only to samples taken after fasting for at least 8 hours.   BUN 11 8 - 23 mg/dL   Creatinine, Ser  0.83 0.61 - 1.24 mg/dL   Calcium   9.2 8.9 - 10.3 mg/dL   GFR, Estimated >39 >39 mL/min    Comment: (NOTE) Calculated using the CKD-EPI Creatinine Equation (2021)    Anion gap 20 (H) 5 - 15    Comment: Performed at [facility], [address] [place], [place] [address]  Lipase, blood     Status: None   Collection Time: 12/22/23  9:00 AM  Result Value Ref Range   Lipase 11 11 - 51 U/L    Comment: Performed at [facility], [address], [place], [place] [address]  Procalcitonin     Status: None   Collection Time: 12/22/23  9:00 AM  Result Value Ref Range   Procalcitonin <0.10 ng/mL    Comment: (NOTE)   Sepsis PCT Algorithm          Lower Respiratory Tract Infection                                         PCT Algorithm -----------------------------------------------------------------  <0.5 ng/mL                    <0.10 ng/mL  Associated with low           Antibiotic therapy strongly   risk for progression          discouraged. Indicates absence   to severe sepsis              of bacteria infection  and/or septic shock             --------------------------------------------------------------  0.5-2.0 ng/mL                 0.10-0.25 ng/mL  Recommended to retest         Antibiotic therapy discouraged.  PCT within 6-24 hours         Bacterial infection unlikely  ------------------------------------------------------------  >2 ng/mL                      0.26-0.50 ng/mL  Associated with high risk     Antibiotic therapy encouraged.  for progression to severe     Bacterial infection possible  sepsis/and or septic shock    ------------------------------                                 >0.50 ng/mL                                Antibiotic therapy strongly                                 encouraged.                                Suggestive of presence of                                 bacterial infection.                                  -------------------------------------------------------------------  <  or = 0.50 ng/mL OR          < or = 0.25 OR 80% decrease in PCT  80% decrease in PCT           Antibiotic therapy   Antibiotic therapy may        may be discontinued  be discontinued                                 Performed at [facility], [address], [place], [place] [address]   Lactic acid, plasma     Status: None   Collection Time: 12/22/23  9:31 AM  Result Value Ref Range   Lactic Acid, Venous 1.6 0.5 - 1.9 mmol/L    Comment: Performed at [facility], [address], [place], [place] [address]  Lactic acid, plasma     Status: None   Collection Time: 12/22/23 12:54 PM  Result Value Ref Range   Lactic Acid, Venous 1.4 0.5 - 1.9 mmol/L    Comment: Performed at [facility], [address] [place], [place] [address]  Glucose, capillary     Status: None   Collection Time: 12/22/23  9:39 PM  Result Value Ref Range   Glucose-Capillary 98 70 - 99 mg/dL    Comment: Glucose reference range applies only to samples taken after fasting for at least 8 hours.  CBC with Differential     Status: Abnormal   Collection Time: 12/23/23  5:40 AM  Result Value Ref Range   WBC 10.9 (H) 4.0 - 10.5 K/uL   RBC 4.36 4.22 - 5.81 MIL/uL   Hemoglobin 13.4 13.0 - 17.0 g/dL   HCT 58.0 60.9 - 47.9 %   MCV 96.1 80.0 - 100.0 fL   MCH 30.7 26.0 - 34.0 pg   MCHC 32.0 30.0 - 36.0 g/dL   RDW 86.8 88.4 - 84.4 %   Platelets 198 150 - 400 K/uL   nRBC 0.0 0.0 - 0.2 %   Neutrophils Relative % 78 %   Neutro Abs 8.6 (H) 1.7 - 7.7 K/uL   Lymphocytes Relative 14 %   Lymphs Abs 1.5 0.7 - 4.0 K/uL   Monocytes Relative 7 %   Monocytes Absolute 0.7 0.1 - 1.0 K/uL   Eosinophils Relative 1 %   Eosinophils Absolute 0.1 0.0 - 0.5 K/uL   Basophils Relative 0 %   Basophils Absolute 0.0 0.0 - 0.1 K/uL   Immature Granulocytes 0 %   Abs Immature Granulocytes 0.04 0.00 - 0.07 K/uL     Comment: Performed at [facility], [address], [place], [place] [address]  Comprehensive metabolic panel     Status: Abnormal   Collection Time: 12/23/23  5:40 AM  Result Value Ref Range   Sodium 138 135 - 145 mmol/L   Potassium 4.3 3.5 - 5.1 mmol/L    Comment: Delta check noted    Chloride 103 98 - 111 mmol/L   CO2 23 22 - 32 mmol/L   Glucose, Bld 93 70 - 99 mg/dL    Comment: Glucose reference range applies only to samples taken after fasting for at least 8 hours.   BUN 15 8 - 23 mg/dL   Creatinine, Ser 9.11 0.61 - 1.24 mg/dL   Calcium  8.6 (L) 8.9 - 10.3 mg/dL   Total Protein 5.3 (L) 6.5 - 8.1 g/dL   Albumin 3.3 (L) 3.5 -  5.0 g/dL   AST 25 15 - 41 U/L    Comment: HEMOLYSIS AT THIS LEVEL MAY AFFECT RESULT   ALT 7 0 - 44 U/L   Alkaline Phosphatase 44 38 - 126 U/L   Total Bilirubin 0.9 0.0 - 1.2 mg/dL   GFR, Estimated >39 >39 mL/min    Comment: (NOTE) Calculated using the CKD-EPI Creatinine Equation (2021)    Anion gap 13 5 - 15    Comment: Performed at [facility], [address] [place], [place] [address]  Magnesium      Status: Abnormal   Collection Time: 12/23/23  5:40 AM  Result Value Ref Range   Magnesium  1.4 (L) 1.7 - 2.4 mg/dL    Comment: Performed at [facility], [address], [place], [place] [address]  Procalcitonin     Status: None   Collection Time: 12/23/23  5:40 AM  Result Value Ref Range   Procalcitonin <0.10 ng/mL    Comment: (NOTE)   Sepsis PCT Algorithm          Lower Respiratory Tract Infection                                         PCT Algorithm -----------------------------------------------------------------  <0.5 ng/mL                    <0.10 ng/mL  Associated with low           Antibiotic therapy strongly   risk for progression          discouraged. Indicates absence   to severe sepsis              of bacteria infection  and/or septic shock              --------------------------------------------------------------  0.5-2.0 ng/mL                 0.10-0.25 ng/mL  Recommended to retest         Antibiotic therapy discouraged.  PCT within 6-24 hours         Bacterial infection unlikely  ------------------------------------------------------------  >2 ng/mL                      0.26-0.50 ng/mL  Associated with high risk     Antibiotic therapy encouraged.  for progression to severe     Bacterial infection possible  sepsis/and or septic shock    ------------------------------                                 >0.50 ng/mL                                Antibiotic therapy strongly                                 encouraged.                                Suggestive of presence of  bacterial infection.                                 -------------------------------------------------------------------  < or = 0.50 ng/mL OR          < or = 0.25 OR 80% decrease in PCT  80% decrease in PCT           Antibiotic therapy   Antibiotic therapy may        may be discontinued  be discontinued                                 Performed at [facility], [address] [place], [place] [address]   Glucose, capillary     Status: None   Collection Time: 12/23/23  7:18 AM  Result Value Ref Range   Glucose-Capillary 87 70 - 99 mg/dL    Comment: Glucose reference range applies only to samples taken after fasting for at least 8 hours.    Studies/Results: CT ABDOMEN PELVIS W CONTRAST Result Date: 12/22/2023 EXAM: CT ABDOMEN AND PELVIS WITH CONTRAST 12/22/2023 07:25:18 AM TECHNIQUE: CT of the abdomen and pelvis was performed with the administration of 100 mL of iohexol  (OMNIPAQUE ) 350 MG/ML injection. Multiplanar reformatted images are provided for review. Automated exposure control, iterative reconstruction, and/or weight-based adjustment of the mA/kV was utilized to reduce the radiation dose to as low as  reasonably achievable. COMPARISON: 07/11/2023 CLINICAL HISTORY: Abdominal pain, acute, nonlocalized. FINDINGS: LOWER CHEST: Scattered coronary and aortic plaque. LIVER: The liver is unremarkable. GALLBLADDER AND BILE DUCTS: Gallbladder is unremarkable. No biliary ductal dilatation. SPLEEN: No acute abnormality. PANCREAS: The inflammatory changes around the pancreatic tail seen previously have resolved. ADRENAL GLANDS: No acute abnormality. KIDNEYS, URETERS AND BLADDER: Multiple cortical lesions in kidneys, some of which can be characterized as simple cysts, largest 2.1 cm 15 HU right upper pole. Per consensus, no follow-up is needed for simple Bosniak type 1 and 2 renal cysts, unless the patient has a malignancy history or risk factors. No stones in the kidneys or ureters. No hydronephrosis. No perinephric or periureteral stranding. Urinary bladder is unremarkable. GI AND BOWEL: Stomach demonstrates no acute abnormality. Scattered sigmoid diverticula without adjacent inflammatory/edematous change. Anastomatic staple line near the rectosigmoid junction. Normal appendix. There is no bowel obstruction. PERITONEUM AND RETROPERITONEUM: No ascites. No free air. VASCULATURE: Aorta is normal in caliber. Patent aortobifem graft. LYMPH NODES: No lymphadenopathy. REPRODUCTIVE ORGANS: Moderate prostate enlargement. BONES AND SOFT TISSUES: Solid-appearing fusion L2-L5. Moderate adjacent level DJD and degenerative disc disease L1-L2. No acute osseous abnormality. No focal soft tissue abnormality. IMPRESSION: 1. No acute findings in the abdomen or pelvis. 2. Resolution of prior inflammatory changes around the pancreatic tail. 3. Moderate prostatomegaly. 4. Scattered sigmoid diverticulosis without evidence of diverticulitis. 5. Patent aortic graft. Electronically signed by: Katheleen Faes MD 12/22/2023 08:43 AM EDT RP Workstation: [id number]   CT Angio Chest PE W and/or Wo Contrast Result Date: 12/22/2023 EXAM: CTA of the Chest  with contrast for PE 12/22/2023 07:25:18 AM TECHNIQUE: CTA of the chest was performed after the administration of intravenous contrast. Multiplanar reformatted images are provided for review. MIP images are provided for review. Automated exposure control, iterative reconstruction, and/or weight based adjustment of the mA/kV was utilized to reduce the radiation dose to as low as reasonably achievable. COMPARISON: 01/31/2019 CLINICAL HISTORY: Pulmonary embolism (PE) suspected,  low to intermediate probability, positive D-dimer. FINDINGS: PULMONARY ARTERIES: Pulmonary arteries are adequately opacified for evaluation. No pulmonary embolism. Main pulmonary artery is normal in caliber. MEDIASTINUM: Scattered coronary atheromatous calcifications. The heart and pericardium demonstrate no acute abnormality. Scattered aortic atheromatous calcifications. Bovine variant aortic arch branch anatomy. Mild stenosis in the proximal left subclavian artery. There is no acute abnormality of the thoracic aorta. LYMPH NODES: No mediastinal, hilar or axillary lymphadenopathy. LUNGS AND PLEURA: Pulmonary emphysema. No focal consolidation or pulmonary edema. No pleural effusion or pneumothorax. UPPER ABDOMEN: Limited images of the upper abdomen are unremarkable. SOFT TISSUES AND BONES: Vertebral endplate spurring at multiple levels in the lower thoracic spine. No acute soft tissue abnormality. IMPRESSION: 1. No evidence of pulmonary embolism. 2. Coronary and aortic plaque. 3. Pulmonary emphysema; consider evaluation for low-dose CT lung cancer screening if age 14. Electronically signed by: Katheleen Faes MD 12/22/2023 08:37 AM EDT RP Workstation: [id number]   CT Head Wo Contrast Result Date: 12/22/2023 EXAM: CT HEAD WITHOUT CONTRAST 12/22/2023 07:25:18 AM TECHNIQUE: CT of the head was performed without the administration of intravenous contrast. Automated exposure control, iterative reconstruction, and/or weight based adjustment of the  mA/kV was utilized to reduce the radiation dose to as low as reasonably achievable. COMPARISON: MRI head 07/24/2023 CLINICAL HISTORY: Headache, new onset (Age >= 51y) FINDINGS: BRAIN AND VENTRICLES: No acute hemorrhage. No evidence of acute infarct. No hydrocephalus. No extra-axial collection. No mass effect or midline shift. ORBITS: No acute abnormality. SINUSES: No acute abnormality. SOFT TISSUES AND SKULL: No acute soft tissue abnormality. No skull fracture. IMPRESSION: 1. No acute intracranial abnormality. Electronically signed by: Gilmore Molt MD 12/22/2023 07:43 AM EDT RP Workstation: [id number]   DG Chest Portable 1 View Result Date: 12/22/2023 EXAM: 1 VIEW(S) XRAY OF THE CHEST 12/22/2023 04:19:00 AM COMPARISON: PA Chest with Rib Series 07/08/2019. CLINICAL HISTORY: sepsis FINDINGS: LUNGS AND PLEURA: There is a low inspiration on exam. The hypoexpanded lungs are generally clear. No focal pulmonary opacity. No pulmonary edema. No pleural effusion. No pneumothorax. HEART AND MEDIASTINUM: Stable mediastinum with aortic atherosclerosis. The cardiac size is normal. BONES AND SOFT TISSUES: There is thoracic spondylosis. No acute osseous abnormality. Overlying telemetry leads. IMPRESSION: 1. No acute cardiopulmonary radiographic findings. 2. Hypoinflated, with limited view of the bases. Electronically signed by: Francis Quam MD 12/22/2023 04:28 AM EDT RP Workstation: [id number]    Medications: I have reviewed the patient's current medications.  Assessment: Intermittent nausea and vomiting associated with dizziness and unintentional weight loss of 40 to 45 pounds in the last 1 year  Recent history of pancreatitis  Low magnesium  less than 0.5 corrected to 1.4 Moderate malnutrition, albumin 3.3, total protein 5.3 Lactic acidosis of 2.7 on admission which is resolved  CT showed no acute findings, resolution of prior pancreatitis, moderate prostatomegaly, scattered colonic  diverticulosis  Colonoscopy, 08/08/2023, Dr. Donnald, surveillance, family history of colon cancer in mother: About 20 diminutive adenomas were removed(ascending, appendiceal orifice, transverse, descending), repeat colonoscopy recommended in 1 year   EGD, 08/08/2023, Dr. Donnald, weight loss, intermittent diarrhea, follow-up for Glipizide induced pancreatitis: Unremarkable, biopsies did not show celiac, H. pylori but he had chronic inactive gastritis  Plan: To complete GI workup, I recommend getting HIDA scan with EF although cholecystitis less likely because of nausea, vomiting.  I believe patient will benefit from ENT evaluation, as nausea, vomiting and dizziness may be related to inner ear disease. CT head without contrast did not show any abnormality.  Continue clear liquid diet for now,  with plans to advance as tolerated. On boost 3 times a day, on Zofran  4 mg oral IV every 6 hours as needed, Compazine  10 mg IV every 6 hours as needed.   Estelita Manas, MD 12/23/2023, 9:52 AM

## 2023-12-23 NOTE — Care Management Obs Status (Signed)
 MEDICARE OBSERVATION STATUS NOTIFICATION   Patient Details  Name: Ray Brown MRN: [id number] Date of Birth: [birth year]   Medicare Observation Status Notification Given:  Yes    Sheri ONEIDA Sharps, LCSW 12/23/2023, 4:41 PM

## 2023-12-23 NOTE — Plan of Care (Incomplete)
  Problem: Education: Goal: Knowledge of General Education information will improve Description: Including pain rating scale, medication(s)/side effects and non-pharmacologic comfort measures Outcome: Progressing   Problem: Health Behavior/Discharge Planning: Goal: Ability to manage health-related needs will improve Outcome: Progressing   Problem: Clinical Measurements: Goal: Ability to maintain clinical measurements within normal limits will improve Outcome: Progressing Goal: Will remain free from infection Outcome: Progressing Goal: Diagnostic test results will improve Outcome: Progressing   Problem: Activity: Goal: Risk for activity intolerance will decrease Outcome: Progressing   Problem: Nutrition: Goal: Adequate nutrition will be maintained Outcome: Progressing   Problem: Coping: Goal: Level of anxiety will decrease Outcome: Progressing   Problem: Safety: Goal: Ability to remain free from injury will improve Outcome: Progressing   Problem: Clinical Measurements: Goal: Respiratory complications will improve Outcome: Adequate for Discharge Goal: Cardiovascular complication will be avoided Outcome: Adequate for Discharge   Problem: Elimination: Goal: Will not experience complications related to bowel motility Outcome: Adequate for Discharge Goal: Will not experience complications related to urinary retention Outcome: Adequate for Discharge   Problem: Pain Managment: Goal: General experience of comfort will improve and/or be controlled Outcome: Adequate for Discharge   Problem: Skin Integrity: Goal: Risk for impaired skin integrity will decrease Outcome: Adequate for Discharge

## 2023-12-23 NOTE — Progress Notes (Signed)
 PROGRESS NOTE    BROWN DUNLAP  FMW:[id number] DOB: [birth year] DOA: 12/22/2023 PCP: Loreli Kins, MD   Brief Narrative:  Ray Brown is a 73 y.o. male with medical history significant of osteoarthritis, spinal stenosis, bronchitis, chronic back pain, CAD, history of unstable angina episode, hypertension, history of hypertensive heart disease, hyperlipidemia, aortic atherosclerosis, peripheral vascular disease, history of pneumonia, sleep apnea on CPAP, moderate protein malnutrition, history of unspecified class obesity, type 2 diabetes, GERD, hiatal hernia, Barrett's esophagus, diverticulosis/diverticulitis, constipation,, colon polyp, history of BPH who presents emergency department complaints of nausea and numerous episodes of emesis since Thursday of last week.  Gastroenterology was consulted and he underwent CT of the head without contrast and as well as CTA of the chest and abdomen/pelvis as below. Still having some Nausea so will continue CLD. GI obtaining HIDA Scan.   Assessment and Plan:  Intractable nausea and vomiting Superimposed on  Gastroesophageal reflux disease without esophagitis Associated with Barrett's esophagus. Admit to PCU/inpatient. Continue IV fluids with LR at 75 mL/hr. CT Abd/Pelvis done and showed No acute findings in the abdomen or pelvis. Resolution of prior inflammatory changes around the pancreatic tail. Moderate prostatomegaly. Scattered sigmoid diverticulosis without evidence of diverticulitis. Patent aortic graft. GI Consulted and appreciate evaluation and checking a HIDA Scan w/ EF. Repeat CBC CMP in the AM. C/w CLD for now  Dizziness: Added Meclizine 25 mg po Daily PRN and Scopolamine Patch. Head CT done w/o Contrast and showed No acute Intracranial Abnormality; Check MRI of Brain.  GI believes his nausea vomiting and dizziness is related to inner ear disease and recommending ENT evaluation  Hypokalemia: Improved as K+ is now 4.3. CTM and Replete as  Necessary. Repeat CMP in the AM   Hypomagnesemia: Mag Level was 1.4. Replete w/ IV Mag Sulfate 4 grams. CTM and Replete as Necessary. Repeat Mag Level in the AM   Essential Hypertension: Has had significant weight loss. Currently not on antihypertensives except Metoprolol  Succinate 25 mg po Daily.    Peripheral vascular disease: Initially held DAPT but now will resume ASA and continue to Hold Plavix . GI unlikely to repeat endoscopic studies needed given recent EGD and Colonoscopy in June. GI recommending obtaining HIDA Scan and awaiting results of this prior to resuming Clopidogrel    Dyslipidemia: Resumed Fenofibrate  160 mg po at bedtime and Rosuvastatin 20 mg po Daily    OSA (obstructive sleep apnea): Currently not using CPAP   Hypoalbuminemia: Patient's Albumin Lvl went from 4.2 -> 3.3. CTM and Replete as Necessary. Repeat CMP in the AM:  Weight Loss: Nutritionist Consult. CT Abd/Pelvis as above. CTA Chest PE Protocol showed No evidence of pulmonary embolism, Coronary and aortic plaque, and Pulmonary emphysema; Resume Ensure Plus TID   DVT prophylaxis: SCDs Start: 12/22/23 1554    Code Status: Full Code Family Communication: D/w wife at bedside  Disposition Plan:  Level of care: Progressive Status is: Inpatient Remains inpatient appropriate because: Needs further clinical improvement and clearance by the specialists    Consultants:  Gastroenterology  Procedures:  As delineated as above  Antimicrobials:  Anti-infectives (From admission, onward)    Start     Dose/Rate Route Frequency Ordered Stop   12/23/23 0200  vancomycin (VANCOREADY) IVPB 1750 mg/350 mL  Status:  Discontinued        1,750 mg 175 mL/hr over 120 Minutes Intravenous Every 24 hours 12/22/23 1607 12/23/23 1841   12/22/23 1700  ceFEPIme  (MAXIPIME ) 2 g in sodium chloride  0.9 % 100 mL IVPB  Status:  Discontinued        2 g 200 mL/hr over 30 Minutes Intravenous Every 8 hours 12/22/23 1607 12/23/23 1841   12/22/23  1600  metroNIDAZOLE  (FLAGYL ) IVPB 500 mg  Status:  Discontinued        500 mg 100 mL/hr over 60 Minutes Intravenous 2 times daily 12/22/23 1553 12/23/23 1841   12/22/23 0400  ceFEPIme  (MAXIPIME ) 2 g in sodium chloride  0.9 % 100 mL IVPB        2 g 200 mL/hr over 30 Minutes Intravenous  Once 12/22/23 0346 12/22/23 0500   12/22/23 0400  metroNIDAZOLE  (FLAGYL ) IVPB 500 mg        500 mg 100 mL/hr over 60 Minutes Intravenous  Once 12/22/23 0346 12/22/23 0616   12/22/23 0400  vancomycin (VANCOCIN) IVPB 1000 mg/200 mL premix        1,000 mg 200 mL/hr over 60 Minutes Intravenous  Once 12/22/23 0346 12/22/23 0713       Subjective: Seen and examined at bedside and was still feeling nauseous and did not feel much better at all.  Still feeling very dizzy.  Denied any chest pain.  States that he has been vomiting quite a bit.  No other concerns or complaints at this time.  Objective: Vitals:   12/22/23 1913 12/22/23 2042 12/23/23 0025 12/23/23 1357  BP: (!) 112/55 (!) 118/59 113/63 (!) 107/54  Pulse: 88 84 75 64  Resp: (!) 24 20 20 14   Temp: 100 F (37.8 C) 99.3 F (37.4 C) 98.5 F (36.9 C) 97.9 F (36.6 C)  TempSrc: Oral Oral  Oral  SpO2: 94% 94% 92% 95%  Weight:      Height:        Intake/Output Summary (Last 24 hours) at 12/23/2023 1846 Last data filed at 12/23/2023 1832 Gross per 24 hour  Intake 3821.77 ml  Output --  Net 3821.77 ml   Filed Weights   12/22/23 1251  Weight: 71 kg   Examination: Physical Exam:  Constitutional: WN/WD chronically ill-appearing Caucasian male who appears uncomfortable Respiratory: Diminished to auscultation bilaterally, no wheezing, rales, rhonchi or crackles. Normal respiratory effort and patient is not tachypenic. No accessory muscle use.  Unlabored breathing Cardiovascular: RRR, no murmurs / rubs / gallops. S1 and S2 auscultated. No extremity edema.   Abdomen: Soft, non-tender, non-distended. Bowel sounds positive.  GU:  Deferred. Musculoskeletal: No clubbing / cyanosis of digits/nails. No joint deformity upper and lower extremities.  Skin: No rashes, lesions, ulcers on a limited skin evaluation. No induration; Warm and dry.  Neurologic: CN 2-12 grossly intact with no focal deficits. Romberg sign cerebellar reflexes not assessed.  Psychiatric: Normal judgment and insight. Alert and oriented x 3. Normal mood and appropriate affect.   Data Reviewed: I have personally reviewed following labs and imaging studies  CBC: Recent Labs  Lab 12/22/23 0358 12/23/23 0540  WBC 12.2* 10.9*  NEUTROABS 11.2* 8.6*  HGB 16.5 13.4  HCT 50.4 41.9  MCV 93.5 96.1  PLT 240 198   Basic Metabolic Panel: Recent Labs  Lab 12/22/23 0358 12/22/23 0900 12/23/23 0540  NA 144 143 138  K 3.2* 3.3* 4.3  CL 100 102 103  CO2 20* 21* 23  GLUCOSE 146* 132* 93  BUN 11 11 15   CREATININE 0.82 0.83 0.88  CALCIUM  9.9 9.2 8.6*  MG  --  <0.5* 1.4*  PHOS  --  2.8  --    GFR: Estimated Creatinine Clearance: 71.3 mL/min (by C-G formula based  on SCr of 0.88 mg/dL). Liver Function Tests: Recent Labs  Lab 12/22/23 0358 12/23/23 0540  AST 21 25  ALT 10 7  ALKPHOS 58 44  BILITOT 0.7 0.9  PROT 6.7 5.3*  ALBUMIN 4.2 3.3*   Recent Labs  Lab 12/22/23 0900  LIPASE 11   No results for input(s): AMMONIA in the last 168 hours. Coagulation Profile: Recent Labs  Lab 12/22/23 0347  INR 1.1   Cardiac Enzymes: No results for input(s): CKTOTAL, CKMB, CKMBINDEX, TROPONINI in the last 168 hours. BNP (last 3 results) No results for input(s): PROBNP in the last 8760 hours. HbA1C: No results for input(s): HGBA1C in the last 72 hours. CBG: Recent Labs  Lab 12/22/23 2139 12/23/23 0718 12/23/23 1138 12/23/23 1711  GLUCAP 98 87 115* 158*   Lipid Profile: No results for input(s): CHOL, HDL, LDLCALC, TRIG, CHOLHDL, LDLDIRECT in the last 72 hours. Thyroid Function Tests: No results for input(s): TSH,  T4TOTAL, FREET4, T3FREE, THYROIDAB in the last 72 hours. Anemia Panel: No results for input(s): VITAMINB12, FOLATE, FERRITIN, TIBC, IRON, RETICCTPCT in the last 72 hours. Sepsis Labs: Recent Labs  Lab 12/22/23 0404 12/22/23 0622 12/22/23 0900 12/22/23 0931 12/22/23 1254 12/23/23 0540  PROCALCITON  --   --  <0.10  --   --  <0.10  LATICACIDVEN 1.4 2.7*  --  1.6 1.4  --    Recent Results (from the past 240 hours)  Resp panel by RT-PCR (RSV, Flu A&B, Covid)     Status: None   Collection Time: 12/22/23  3:47 AM   Specimen: Nasal Swab  Result Value Ref Range Status   SARS Coronavirus 2 by RT PCR NEGATIVE NEGATIVE Final    Comment: (NOTE) SARS-CoV-2 target nucleic acids are NOT DETECTED.  The SARS-CoV-2 RNA is generally detectable in upper respiratory specimens during the acute phase of infection. The lowest concentration of SARS-CoV-2 viral copies this assay can detect is 138 copies/mL. A negative result does not preclude SARS-Cov-2 infection and should not be used as the sole basis for treatment or other patient management decisions. A negative result may occur with  improper specimen collection/handling, submission of specimen other than nasopharyngeal swab, presence of viral mutation(s) within the areas targeted by this assay, and inadequate number of viral copies(<138 copies/mL). A negative result must be combined with clinical observations, patient history, and epidemiological information. The expected result is Negative.  Fact Sheet for Patients:  bloggercourse.com  Fact Sheet for Healthcare Providers:  seriousbroker.it  This test is no t yet approved or cleared by the [place]  FDA and  has been authorized for detection and/or diagnosis of SARS-CoV-2 by FDA under an Emergency Use Authorization (EUA). This EUA will remain  in effect (meaning this test can be used) for the duration of the COVID-19  declaration under Section 564(b)(1) of the Act, 21 U.S.C.section 360bbb-3(b)(1), unless the authorization is terminated  or revoked sooner.       Influenza A by PCR NEGATIVE NEGATIVE Final   Influenza B by PCR NEGATIVE NEGATIVE Final    Comment: (NOTE) The Xpert Xpress SARS-CoV-2/FLU/RSV plus assay is intended as an aid in the diagnosis of influenza from Nasopharyngeal swab specimens and should not be used as a sole basis for treatment. Nasal washings and aspirates are unacceptable for Xpert Xpress SARS-CoV-2/FLU/RSV testing.  Fact Sheet for Patients: bloggercourse.com  Fact Sheet for Healthcare Providers: seriousbroker.it  This test is not yet approved or cleared by the [place]  FDA and has been authorized for detection  and/or diagnosis of SARS-CoV-2 by FDA under an Emergency Use Authorization (EUA). This EUA will remain in effect (meaning this test can be used) for the duration of the COVID-19 declaration under Section 564(b)(1) of the Act, 21 U.S.C. section 360bbb-3(b)(1), unless the authorization is terminated or revoked.     Resp Syncytial Virus by PCR NEGATIVE NEGATIVE Final    Comment: (NOTE) Fact Sheet for Patients: bloggercourse.com  Fact Sheet for Healthcare Providers: seriousbroker.it  This test is not yet approved or cleared by the [place]  FDA and has been authorized for detection and/or diagnosis of SARS-CoV-2 by FDA under an Emergency Use Authorization (EUA). This EUA will remain in effect (meaning this test can be used) for the duration of the COVID-19 declaration under Section 564(b)(1) of the Act, 21 U.S.C. section 360bbb-3(b)(1), unless the authorization is terminated or revoked.  Performed at [facility], [address], [place], [place] [address]   Blood culture (routine x 2)     Status: None (Preliminary result)    Collection Time: 12/22/23  3:58 AM   Specimen: BLOOD  Result Value Ref Range Status   Specimen Description   Final    BLOOD LEFT ANTECUBITAL Performed at [facility], [address], [place], [place] [address]    Special Requests   Final    Blood Culture results may not be optimal due to an inadequate volume of blood received in culture bottles BOTTLES DRAWN AEROBIC AND ANAEROBIC Performed at [facility], [address] [place], [place] [address]    Culture   Final    NO GROWTH 1 DAY Performed at [facility], [address] [place], [place] [address]    Report Status PENDING  Incomplete  Blood culture (routine x 2)     Status: None (Preliminary result)   Collection Time: 12/22/23  4:26 AM   Specimen: BLOOD  Result Value Ref Range Status   Specimen Description   Final    BLOOD RIGHT ANTECUBITAL Performed at [facility], [address] [place], [place] [address]    Special Requests   Final    Blood Culture results may not be optimal due to an inadequate volume of blood received in culture bottles BOTTLES DRAWN AEROBIC AND ANAEROBIC Performed at [facility], [address] [place], [place] [address]    Culture   Final    NO GROWTH 1 DAY Performed at [facility], [address], [place], [place] [address]    Report Status PENDING  Incomplete    Radiology Studies: CT ABDOMEN PELVIS W CONTRAST Result Date: 12/22/2023 EXAM: CT ABDOMEN AND PELVIS WITH CONTRAST 12/22/2023 07:25:18 AM TECHNIQUE: CT of the abdomen and pelvis was performed with the administration of 100 mL of iohexol  (OMNIPAQUE ) 350 MG/ML injection. Multiplanar reformatted images are provided for review. Automated exposure control, iterative reconstruction, and/or weight-based adjustment of the mA/kV was utilized to reduce the radiation dose to as low as reasonably achievable. COMPARISON: 07/11/2023 CLINICAL HISTORY: Abdominal  pain, acute, nonlocalized. FINDINGS: LOWER CHEST: Scattered coronary and aortic plaque. LIVER: The liver is unremarkable. GALLBLADDER AND BILE DUCTS: Gallbladder is unremarkable. No biliary ductal dilatation. SPLEEN: No acute abnormality. PANCREAS: The inflammatory changes around the pancreatic tail seen previously have resolved. ADRENAL GLANDS: No acute abnormality. KIDNEYS, URETERS AND BLADDER: Multiple cortical lesions in kidneys, some of which can be characterized as simple cysts, largest 2.1 cm 15 HU right upper pole. Per consensus, no follow-up  is needed for simple Bosniak type 1 and 2 renal cysts, unless the patient has a malignancy history or risk factors. No stones in the kidneys or ureters. No hydronephrosis. No perinephric or periureteral stranding. Urinary bladder is unremarkable. GI AND BOWEL: Stomach demonstrates no acute abnormality. Scattered sigmoid diverticula without adjacent inflammatory/edematous change. Anastomatic staple line near the rectosigmoid junction. Normal appendix. There is no bowel obstruction. PERITONEUM AND RETROPERITONEUM: No ascites. No free air. VASCULATURE: Aorta is normal in caliber. Patent aortobifem graft. LYMPH NODES: No lymphadenopathy. REPRODUCTIVE ORGANS: Moderate prostate enlargement. BONES AND SOFT TISSUES: Solid-appearing fusion L2-L5. Moderate adjacent level DJD and degenerative disc disease L1-L2. No acute osseous abnormality. No focal soft tissue abnormality. IMPRESSION: 1. No acute findings in the abdomen or pelvis. 2. Resolution of prior inflammatory changes around the pancreatic tail. 3. Moderate prostatomegaly. 4. Scattered sigmoid diverticulosis without evidence of diverticulitis. 5. Patent aortic graft. Electronically signed by: Katheleen Faes MD 12/22/2023 08:43 AM EDT RP Workstation: [id number]   CT Angio Chest PE W and/or Wo Contrast Result Date: 12/22/2023 EXAM: CTA of the Chest with contrast for PE 12/22/2023 07:25:18 AM TECHNIQUE: CTA of the chest  was performed after the administration of intravenous contrast. Multiplanar reformatted images are provided for review. MIP images are provided for review. Automated exposure control, iterative reconstruction, and/or weight based adjustment of the mA/kV was utilized to reduce the radiation dose to as low as reasonably achievable. COMPARISON: 01/31/2019 CLINICAL HISTORY: Pulmonary embolism (PE) suspected, low to intermediate probability, positive D-dimer. FINDINGS: PULMONARY ARTERIES: Pulmonary arteries are adequately opacified for evaluation. No pulmonary embolism. Main pulmonary artery is normal in caliber. MEDIASTINUM: Scattered coronary atheromatous calcifications. The heart and pericardium demonstrate no acute abnormality. Scattered aortic atheromatous calcifications. Bovine variant aortic arch branch anatomy. Mild stenosis in the proximal left subclavian artery. There is no acute abnormality of the thoracic aorta. LYMPH NODES: No mediastinal, hilar or axillary lymphadenopathy. LUNGS AND PLEURA: Pulmonary emphysema. No focal consolidation or pulmonary edema. No pleural effusion or pneumothorax. UPPER ABDOMEN: Limited images of the upper abdomen are unremarkable. SOFT TISSUES AND BONES: Vertebral endplate spurring at multiple levels in the lower thoracic spine. No acute soft tissue abnormality. IMPRESSION: 1. No evidence of pulmonary embolism. 2. Coronary and aortic plaque. 3. Pulmonary emphysema; consider evaluation for low-dose CT lung cancer screening if age 57. Electronically signed by: Katheleen Faes MD 12/22/2023 08:37 AM EDT RP Workstation: [id number]   CT Head Wo Contrast Result Date: 12/22/2023 EXAM: CT HEAD WITHOUT CONTRAST 12/22/2023 07:25:18 AM TECHNIQUE: CT of the head was performed without the administration of intravenous contrast. Automated exposure control, iterative reconstruction, and/or weight based adjustment of the mA/kV was utilized to reduce the radiation dose to as low as reasonably  achievable. COMPARISON: MRI head 07/24/2023 CLINICAL HISTORY: Headache, new onset (Age >= 51y) FINDINGS: BRAIN AND VENTRICLES: No acute hemorrhage. No evidence of acute infarct. No hydrocephalus. No extra-axial collection. No mass effect or midline shift. ORBITS: No acute abnormality. SINUSES: No acute abnormality. SOFT TISSUES AND SKULL: No acute soft tissue abnormality. No skull fracture. IMPRESSION: 1. No acute intracranial abnormality. Electronically signed by: Gilmore Molt MD 12/22/2023 07:43 AM EDT RP Workstation: [id number]   DG Chest Portable 1 View Result Date: 12/22/2023 EXAM: 1 VIEW(S) XRAY OF THE CHEST 12/22/2023 04:19:00 AM COMPARISON: PA Chest with Rib Series 07/08/2019. CLINICAL HISTORY: sepsis FINDINGS: LUNGS AND PLEURA: There is a low inspiration on exam. The hypoexpanded lungs are generally clear. No focal pulmonary opacity. No pulmonary edema. No pleural effusion. No pneumothorax.  HEART AND MEDIASTINUM: Stable mediastinum with aortic atherosclerosis. The cardiac size is normal. BONES AND SOFT TISSUES: There is thoracic spondylosis. No acute osseous abnormality. Overlying telemetry leads. IMPRESSION: 1. No acute cardiopulmonary radiographic findings. 2. Hypoinflated, with limited view of the bases. Electronically signed by: Francis Quam MD 12/22/2023 04:28 AM EDT RP Workstation: [id number]   Scheduled Meds:  aspirin  EC  81 mg Oral QHS   feeding supplement  237 mL Oral TID BM   fenofibrate   160 mg Oral QHS   metoprolol  succinate  25 mg Oral Daily   multivitamin with minerals  1 tablet Oral Daily   rosuvastatin  20 mg Oral Daily   scopolamine  1 patch Transdermal Q72H   Continuous Infusions:  lactated ringers       LOS: 0 days   Alejandro Marker, DO [facility] Available via Epic secure chat 7am-7pm After these hours, please refer to coverage provider listed on [email or web address] 12/23/2023, 6:46 PM

## 2023-12-23 NOTE — Progress Notes (Signed)
 Pt exhibits s/sx of paranoia/hallucinations this evening. This RN called to bedside when checking in with pt. Pt states I know I'm not crazy, I had placed my glasses on the BST and when I went to get them, I didn't see them where I had left them. I found them in my bed sheets on the other side of me and it caused me to panic. Someone has been coming in here and messing with my stuff. An hour or so later, this RN assisted pt to bathroom and back to bed. After settling into bed/placing blankets, This RN went to hand pt his call bell; PT abruptly sat up in bed in a panic and yelled, I'm not going to let you do that to me!. I reinforced to the pt that he is in the hospital, I am a nurse, and he is safe. Pt calmed down over a few minutes, able to tell me his name, where he is, and my name. He became frightened/anxious/tearful and apologetic expressing, I don't know why this is happening to me. RN provided emotional support, assuring pt that he is safe and I will be checking in on him through the night. Pt expressed appreciation and let me know that he was ok. NP notified of incidents. Behavior in between these two episodes has been appropriate. Plan of care ongoing.

## 2023-12-23 NOTE — Hospital Course (Signed)
 Ray Brown is a 73 y.o. male with medical history significant of osteoarthritis, spinal stenosis, bronchitis, chronic back pain, CAD, history of unstable angina episode, hypertension, history of hypertensive heart disease, hyperlipidemia, aortic atherosclerosis, peripheral vascular disease, history of pneumonia, sleep apnea on CPAP, moderate protein malnutrition, history of unspecified class obesity, type 2 diabetes, GERD, hiatal hernia, Barrett's esophagus, diverticulosis/diverticulitis, constipation,, colon polyp, history of BPH who presents emergency department complaints of nausea and numerous episodes of emesis since Thursday of last week.  Gastroenterology was consulted and he underwent CT of the head without contrast and as well as CTA of the chest and abdomen/pelvis as below. Still having some Nausea so will continue CLD. GI obtaining HIDA Scan.   Assessment and Plan:  Intractable nausea and vomiting Superimposed on  Gastroesophageal reflux disease without esophagitis Associated with Barrett's esophagus. Admit to PCU/inpatient. Continue IV fluids with LR at 75 mL/hr. CT Abd/Pelvis done and showed No acute findings in the abdomen or pelvis. Resolution of prior inflammatory changes around the pancreatic tail. Moderate prostatomegaly. Scattered sigmoid diverticulosis without evidence of diverticulitis. Patent aortic graft. GI Consulted and appreciate evaluation and checking a HIDA Scan w/ EF. Repeat CBC CMP in the AM. C/w CLD for now  Dizziness: Added Meclizine 25 mg po Daily PRN and Scopolamine Patch. Head CT done w/o Contrast and showed No acute Intracranial Abnormality; Check MRI of Brain.  GI believes his nausea vomiting and dizziness is related to inner ear disease and recommending ENT evaluation  Hypokalemia: Improved as K+ is now 4.3. CTM and Replete as Necessary. Repeat CMP in the AM   Hypomagnesemia: Mag Level was 1.4. Replete w/ IV Mag Sulfate 4 grams. CTM and Replete as Necessary.  Repeat Mag Level in the AM   Essential Hypertension: Has had significant weight loss. Currently not on antihypertensives except Metoprolol  Succinate 25 mg po Daily.    Peripheral vascular disease: Initially held DAPT but now will resume ASA and continue to Hold Plavix . GI unlikely to repeat endoscopic studies needed given recent EGD and Colonoscopy in June. GI recommending obtaining HIDA Scan and awaiting results of this prior to resuming Clopidogrel    Dyslipidemia: Resumed Fenofibrate  160 mg po at bedtime and Rosuvastatin 20 mg po Daily    OSA (obstructive sleep apnea): Currently not using CPAP   Hypoalbuminemia: Patient's Albumin Lvl went from 4.2 -> 3.3. CTM and Replete as Necessary. Repeat CMP in the AM:  Weight Loss: Nutritionist Consult. CT Abd/Pelvis as above. CTA Chest PE Protocol showed No evidence of pulmonary embolism, Coronary and aortic plaque, and Pulmonary emphysema; Resume Ensure Plus TID

## 2023-12-24 ENCOUNTER — Inpatient Hospital Stay (HOSPITAL_COMMUNITY)

## 2023-12-24 DIAGNOSIS — F22 Delusional disorders: Secondary | ICD-10-CM

## 2023-12-24 DIAGNOSIS — I10 Essential (primary) hypertension: Secondary | ICD-10-CM | POA: Diagnosis not present

## 2023-12-24 DIAGNOSIS — E785 Hyperlipidemia, unspecified: Secondary | ICD-10-CM | POA: Diagnosis not present

## 2023-12-24 DIAGNOSIS — E876 Hypokalemia: Secondary | ICD-10-CM | POA: Diagnosis not present

## 2023-12-24 DIAGNOSIS — R443 Hallucinations, unspecified: Secondary | ICD-10-CM | POA: Diagnosis present

## 2023-12-24 DIAGNOSIS — R112 Nausea with vomiting, unspecified: Secondary | ICD-10-CM | POA: Diagnosis not present

## 2023-12-24 LAB — CBC WITH DIFFERENTIAL/PLATELET
Abs Immature Granulocytes: 0.02 K/uL (ref 0.00–0.07)
Basophils Absolute: 0 K/uL (ref 0.0–0.1)
Basophils Relative: 1 %
Eosinophils Absolute: 0.2 K/uL (ref 0.0–0.5)
Eosinophils Relative: 2 %
HCT: 44.1 % (ref 39.0–52.0)
Hemoglobin: 13.9 g/dL (ref 13.0–17.0)
Immature Granulocytes: 0 %
Lymphocytes Relative: 18 %
Lymphs Abs: 1.2 K/uL (ref 0.7–4.0)
MCH: 30.1 pg (ref 26.0–34.0)
MCHC: 31.5 g/dL (ref 30.0–36.0)
MCV: 95.5 fL (ref 80.0–100.0)
Monocytes Absolute: 0.5 K/uL (ref 0.1–1.0)
Monocytes Relative: 8 %
Neutro Abs: 4.5 K/uL (ref 1.7–7.7)
Neutrophils Relative %: 71 %
Platelets: 181 K/uL (ref 150–400)
RBC: 4.62 MIL/uL (ref 4.22–5.81)
RDW: 13 % (ref 11.5–15.5)
WBC: 6.4 K/uL (ref 4.0–10.5)
nRBC: 0 % (ref 0.0–0.2)

## 2023-12-24 LAB — COMPREHENSIVE METABOLIC PANEL WITH GFR
ALT: 8 U/L (ref 0–44)
AST: 28 U/L (ref 15–41)
Albumin: 3.1 g/dL — ABNORMAL LOW (ref 3.5–5.0)
Alkaline Phosphatase: 45 U/L (ref 38–126)
Anion gap: 7 (ref 5–15)
BUN: 12 mg/dL (ref 8–23)
CO2: 26 mmol/L (ref 22–32)
Calcium: 8.5 mg/dL — ABNORMAL LOW (ref 8.9–10.3)
Chloride: 105 mmol/L (ref 98–111)
Creatinine, Ser: 0.64 mg/dL (ref 0.61–1.24)
GFR, Estimated: >60 mL/min (ref >60)
Glucose, Bld: 115 mg/dL — ABNORMAL HIGH (ref 70–99)
Potassium: 3.4 mmol/L — ABNORMAL LOW (ref 3.5–5.1)
Sodium: 139 mmol/L (ref 135–145)
Total Bilirubin: 0.5 mg/dL (ref 0.0–1.2)
Total Protein: 5 g/dL — ABNORMAL LOW (ref 6.5–8.1)

## 2023-12-24 LAB — GLUCOSE, CAPILLARY
Glucose-Capillary: 101 mg/dL — ABNORMAL HIGH (ref 70–99)
Glucose-Capillary: 88 mg/dL (ref 70–99)
Glucose-Capillary: 169 mg/dL — ABNORMAL HIGH (ref 70–99)
Glucose-Capillary: 190 mg/dL — ABNORMAL HIGH (ref 70–99)

## 2023-12-24 LAB — VITAMIN B12: Vitamin B-12: 1276 pg/mL — ABNORMAL HIGH (ref 180–914)

## 2023-12-24 LAB — PHOSPHORUS: Phosphorus: 1.8 mg/dL — ABNORMAL LOW (ref 2.5–4.6)

## 2023-12-24 LAB — MAGNESIUM: Magnesium: 1.9 mg/dL (ref 1.7–2.4)

## 2023-12-24 LAB — AMMONIA: Ammonia: 41 umol/L — ABNORMAL HIGH (ref 9–35)

## 2023-12-24 LAB — TSH: TSH: 2.64 u[IU]/mL (ref 0.350–4.500)

## 2023-12-24 LAB — FOLATE: Folate: >20 ng/mL (ref >5.9)

## 2023-12-24 MED ORDER — THIAMINE HCL 100 MG/ML IJ SOLN: 100.0000 mg | Freq: Every day | INTRAMUSCULAR | Status: DC

## 2023-12-24 MED ORDER — THIAMINE HCL 100 MG/ML IJ SOLN
250.0000 mg | Freq: Every day | INTRAVENOUS | Status: DC
  Administered 2023-12-26 – 2023-12-27 (×2): 250 mg via INTRAVENOUS
  Filled 2023-12-24 (×3): qty 2.5

## 2023-12-24 MED ORDER — IOHEXOL 350 MG/ML SOLN
80.0000 mL | Freq: Once | INTRAVENOUS | Status: AC | PRN
  Administered 2023-12-24: 80 mL via INTRAVENOUS

## 2023-12-24 MED ORDER — THIAMINE HCL 100 MG/ML IJ SOLN
500.0000 mg | Freq: Three times a day (TID) | INTRAVENOUS | Status: AC
  Administered 2023-12-24 – 2023-12-26 (×5): 500 mg via INTRAVENOUS
  Filled 2023-12-24: qty 5
  Filled 2023-12-24: qty 1
  Filled 2023-12-24 (×4): qty 5

## 2023-12-24 NOTE — Consult Note (Signed)
 [facility] Psychiatric Consult Initial  Patient Name: .Ray Brown  MRN: [id number]  DOB: 1950-10-24  Consult Order details:  Orders (From admission, onward)     Start     Ordered   12/24/23 1206  IP CONSULT TO PSYCHIATRY       Ordering Provider: [name], DO  Provider:  (Not yet assigned)  Question Answer Comment  Location [facility]   Reason for Consult? Paranoia, Visual Hallucinations      12/24/23 1205             Mode of Visit: In person    Psychiatry Consult Evaluation  Service Date: December 24, 2023 LOS:  LOS: 1 day  Chief Complaint Seeing things, especially at night.  Primary Psychiatric Diagnoses  Hallucinations r/t delirium  Assessment  ARDA KEADLE is a 73 y.o. male admitted: Medicallyfor 12/22/2023  3:21 AM with medical history per Dr. Saintclair significant of osteoarthritis, spinal stenosis, bronchitis, chronic back pain, CAD, history of unstable angina episode, hypertension, history of hypertensive heart disease, hyperlipidemia, aortic atherosclerosis, peripheral vascular disease, history of pneumonia, sleep apnea on CPAP, moderate protein malnutrition, history of unspecified class obesity, type 2 diabetes, GERD, hiatal hernia, Barrett's esophagus, diverticulosis/diverticulitis, constipation, colon polyp, history of BPH who presents emergency department complaints of nausea and numerous episodes of emesis since Thursday of last week.  No psychiatric history  His current presentation of seeing things that started when he was in the ED with a current infection, none prior to this is most consistent with hallucinations under the context of delirium.  Current outpatient psychotropic medications include none. On initial examination, patient was assessed with his wife of 50 years and son at the bedside per his request.  He and the family reported the visual hallucinations started in the ED of seeing distorted faces, his son wearing  makeup when he wasn't, thinking he was standing on his bed, etc. Please see plan below for detailed recommendations.   Diagnoses:  Active Hospital problems: Principal Problem:   Intractable nausea and vomiting Active Problems:   Hallucinations   Peripheral vascular disease   Dyslipidemia   OSA (obstructive sleep apnea)   Hypokalemia   Hypomagnesemia   Essential hypertension   Gastroesophageal reflux disease without esophagitis   Barrett's esophagus    Plan   ## Psychiatric Medication Recommendations:  Start Seroquel 12.5 mg at bedtime for hallucinations and sleep  ## Medical Decision Making Capacity: Not specifically addressed in this encounter  ## Further Work-up:  -- most recent EKG on 11/3 had QtC of 467 -- Pertinent labwork reviewed earlier this admission includes: CBC, chem panel, urine, EKG   ## Disposition:-- There are no psychiatric contraindications to discharge at this time  ## Behavioral / Environmental: -Delirium Precautions: Delirium Interventions for Nursing and Staff: - RN to open blinds every AM. - To Bedside: Glasses, hearing aide, and pt's own shoes. Make available to patients. when possible and encourage use. - Encourage po fluids when appropriate, keep fluids within reach. - OOB to chair with meals. - Passive ROM exercises to all extremities with AM & PM care. - RN to assess orientation to person, time and place QAM and PRN. - Recommend extended visitation hours with familiar family/friends as feasible. - Staff to minimize disturbances at night. Turn off television when pt asleep or when not in use.    ## Safety and Observation Level:  - Based on my clinical evaluation, I estimate the patient to be at minimal  risk of self harm in the current setting. - At this time, we recommend  routine. This decision is based on my review of the chart including patient's history and current presentation, interview of the patient, mental status examination, and consideration  of suicide risk including evaluating suicidal ideation, plan, intent, suicidal or self-harm behaviors, risk factors, and protective factors. This judgment is based on our ability to directly address suicide risk, implement suicide prevention strategies, and develop a safety plan while the patient is in the clinical setting. Please contact our team if there is a concern that risk level has changed.  CSSR Risk Category:C-SSRS RISK CATEGORY: No Risk  Suicide Risk Assessment: Patient has following modifiable risk factors for suicide: none Patient has following non-modifiable or demographic risk factors for suicide: male gender Patient has the following protective factors against suicide: Supportive family, no history of suicide attempts, and no history of NSSIB  Thank you for this consult request. Recommendations have been communicated to the primary team.  We will continue to follow at this time.   Sharlot Becker, NP       History of Present Illness  Relevant Aspects of [facility] Hospital Course:  Admitted on 12/22/2023 for N/V, consult for visual hallucinations and paranoia.   Patient Report:  The client was admitted for nausea and vomiting times one week, history of diverticulitis.  He was treated with antibiotics.  He and his family reported he started having visual hallucinations in the ED with seeing contorted faces, like his son wearing make up when he wasn't.  These continued with admission and increasing at night.  He is not sleeping well and his wife has been staying in his room in the recliner.  She said he was paranoid at night that people are outside of his room watching.  Last night, the client reported he thought he was standing on his bed and he wasn't.  These hallucinations are increasing his anxiety as he has never experienced these before.  Discussed his infection correlating with his symptoms with resolution most likely when the infection resolves.  Recommendations made along with  risks, including cardiovascular risks, of Seroquel or Risperdal or Zyprexa or Ativan .  Family and patient thought Seroquel would help best with his sleep and symptoms, will reassess tomorrow.    Psych ROS:  Depression: some Anxiety:  high at times Mania (lifetime and current): none Psychosis: (lifetime and current): VH   Collateral information:  Contacted wife and son at bedside on 11/3 with patient's permission.  Information included in note above.  Review of Systems  Constitutional:  Positive for malaise/fatigue.  HENT: Negative.    Eyes: Negative.   Respiratory: Negative.    Cardiovascular: Negative.   Gastrointestinal:  Positive for abdominal pain.  Genitourinary: Negative.   Musculoskeletal: Negative.   Skin: Negative.   Neurological: Negative.   Endo/Heme/Allergies: Negative.   Psychiatric/Behavioral:  Positive for hallucinations. The patient is nervous/anxious.      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 Vital Signs:  Temp:  [97.7 F (36.5  C)-98.5 F (36.9 C)] 97.7 F (36.5 C) (11/02 0546) Pulse Rate:  [58-72] 58 (11/02 1013) Resp:  [14-20] 18 (11/02 1013) BP: (107-132)/(54-72) 115/72 (11/02 1013) SpO2:  [94 %-96 %] 94 % (11/02 1013) Blood pressure 115/72, pulse (!) 58, temperature 97.7 F (36.5 C), resp. rate 18, height 5' 8 (1.727 m), weight 71 kg, SpO2 94%. Body mass index is 23.8 kg/m.  Physical Exam  Mental Status Exam: General Appearance: Casual  Orientation:  Full  (Time, Place, and Person)  Memory:  Immediate;   Fair Recent;   Fair Remote;   Fair  Concentration:  Concentration: Fair and Attention Span: Fair  Recall:  Fair  Attention  Fair  Eye Contact:  Fair  Speech:  Normal Rate  Language:  Good  Volume:  Normal  Mood: anxious  Affect:  Blunt  Thought Process:  Coherent  Thought Content:  Hallucinations: Visual  Suicidal Thoughts:  No  Homicidal Thoughts:  No  Judgement:  Fair  Insight:  Fair  Psychomotor Activity:  Decreased  Akathisia:  No  Fund of Knowledge:  Good      Assets:  Housing Leisure Time Resilience Social Support Transportation  Cognition:  WNL  ADL's:  assistance needed in hospital  AIMS (if indicated):        Other History   These have been pulled in through the EMR, reviewed, and updated if appropriate.  Family History:  The patient's family history includes Colon cancer in his mother; Coronary artery disease in his brother; Heart attack in his father; Thyroid cancer in his brother.  Medical History: Past Medical History:  Diagnosis Date   Arthritis    Bronchitis    Chronic back pain    Complication of anesthesia    difficulty breathing s/p back surgery at [facility] 2013   Coronary artery disease    Diabetes mellitus without complication (HCC)    Dry skin    in the back of head-left side   GERD (gastroesophageal reflux disease)    H/O hiatal hernia    Hypercholesteremia    Peripheral vascular disease    Pneumonia    Shortness of breath    Sleep apnea    has CPAP but does not use   Unstable angina (HCC) 12/05/2014    Surgical History: Past Surgical History:  Procedure Laterality Date   AORTA - BILATERAL FEMORAL ARTERY BYPASS GRAFT  11/27/2001   Dr Krystal Early.  Aortobifemoral bypass with a 14 x 8 Hemashield graft.   BACK SURGERY  2004   CARDIAC CATHETERIZATION  2010   x 2 stents RCA   CARDIAC CATHETERIZATION N/A 12/08/2014   Procedure: Left Heart Cath and Coronary Angiography;  Surgeon: Dorn JINNY Lesches, MD;  Location: [facility] INVASIVE CV LAB;  Service: Cardiovascular;  Laterality: N/A;   COLONOSCOPY  08/04/2009   CORONARY ANGIOPLASTY     2 stents   FOOT SURGERY  1980s   right foot   LYSIS OF ADHESION N/A 01/16/2019   Procedure: LYSIS OF ADHESIONS;  Surgeon: Sheldon Standing, MD;  Location: [facility] ORS;  Service: General;  Laterality: N/A;   PROCTOSCOPY N/A 01/16/2019   Procedure: RIGID PROCTOSCOPY;  Surgeon: Sheldon Standing, MD;  Location: [facility] ORS;  Service: General;  Laterality: N/A;   SHOULDER ARTHROSCOPY WITH SUBACROMIAL DECOMPRESSION Right 03/28/2013   Procedure: RIGHT SHOULDER ARTHROSCOPY WITH SUBACROMIAL DECOMPRESSION AND DISTAL CLAVICLE RESECTION;  Surgeon: Franky CHRISTELLA Pointer, MD;  Location: [facility] OR;  Service: Orthopedics;  Laterality: Right;   SHOULDER SURGERY  left shoulder rotator cuff repair   UPPER GI ENDOSCOPY     XI ROBOTIC ASSISTED LOWER ANTERIOR RESECTION N/A 01/16/2019   Procedure: XI ROBOTIC RESECTION OF SIGMOID COLON;  Surgeon: Sheldon Standing, MD;  Location: [facility];  Service: General;  Laterality: N/A;     Medications:   Current Facility-Administered Medications:    acetaminophen  (TYLENOL ) tablet 650 mg, 650 mg, Oral, Q6H PRN, 650 mg at 12/22/23 1636 **OR** acetaminophen  (TYLENOL ) suppository 650 mg, 650 mg, Rectal, Q6H PRN, Celinda Alm Lot, MD   aspirin  EC tablet 81 mg, 81 mg, Oral, QHS, Sheikh, Omair Latif, DO, 81 mg at 12/23/23 2150   feeding supplement (ENSURE PLUS HIGH PROTEIN) liquid 237 mL, 237 mL, Oral, TID BM, Sheikh, Omair Latif, DO   fenofibrate  tablet 160 mg, 160 mg, Oral, QHS, Sheikh, Omair Latif, DO, 160 mg at 12/23/23 2150   lactated ringers  infusion, , Intravenous, Continuous, Sherrill Cable Latif, [place], Last Rate: 75 mL/hr at 12/24/23 1039, New Bag at 12/24/23 1039   meclizine (ANTIVERT) tablet 25 mg, 25 mg, Oral, PRN, Sherrill, Omair Latif, DO, 25 mg at 12/23/23 1451   melatonin tablet 5 mg, 5 mg, Oral, QHS PRN, Chavez, Abigail, NP, 5 mg at 12/23/23 2352    metoprolol  succinate (TOPROL -XL) 24 hr tablet 25 mg, 25 mg, Oral, Daily, Sheikh, Omair Latif, DO, 25 mg at 12/24/23 9171   multivitamin with minerals tablet 1 tablet, 1 tablet, Oral, Daily, Sheikh, Omair Latif, DO, 1 tablet at 12/24/23 0827   nitroGLYCERIN  (NITROSTAT ) SL tablet 0.4 mg, 0.4 mg, Sublingual, Q5 min PRN, Sheikh, Omair Latif, DO   ondansetron  (ZOFRAN ) tablet 4 mg, 4 mg, Oral, Q6H PRN **OR** ondansetron  (ZOFRAN ) injection 4 mg, 4 mg, Intravenous, Q6H PRN, Celinda Alm Lot, MD, 4 mg at 12/23/23 0444   prochlorperazine  (COMPAZINE ) injection 10 mg, 10 mg, Intravenous, Q6H PRN, Celinda Alm Lot, MD   rosuvastatin (CRESTOR) tablet 20 mg, 20 mg, Oral, Daily, Sheikh, Omair Latif, DO, 20 mg at 12/24/23 0830   scopolamine (TRANSDERM-SCOP) 1 MG/3DAYS 1 mg, 1 patch, Transdermal, Q72H, Sheikh, Omair Latif, DO, 1 mg at 12/23/23 1451   thiamine (VITAMIN B1) 500 mg in sodium chloride  0.9 % 50 mL IVPB, 500 mg, Intravenous, Q8H **FOLLOWED BY** [START ON 12/26/2023] thiamine (VITAMIN B1) 250 mg in sodium chloride  0.9 % 50 mL IVPB, 250 mg, Intravenous, Daily **FOLLOWED BY** [START ON 01/01/2024] thiamine (VITAMIN B1) injection 100 mg, 100 mg, Intravenous, Daily, Arora, Ashish, MD  Allergies: Allergies  Allergen Reactions   Augmentin [Amoxicillin-Pot Clavulanate] Nausea And Vomiting   Glipizide Other (See Comments)   Lipitor  [Atorvastatin ]     Muscle pain   Metformin Nausea And Vomiting   Sulfa Antibiotics Dermatitis   Ciprofloxacin Rash   Oxaprozin Rash    Other Reaction(s): Not available    Sharlot Becker, NP

## 2023-12-24 NOTE — Plan of Care (Signed)
   Problem: Education: Goal: Knowledge of General Education information will improve Description Including pain rating scale, medication(s)/side effects and non-pharmacologic comfort measures Outcome: Progressing   Problem: Health Behavior/Discharge Planning: Goal: Ability to manage health-related needs will improve Outcome: Progressing   Problem: Clinical Measurements: Goal: Ability to maintain clinical measurements within normal limits will improve Outcome: Progressing Goal: Will remain free from infection Outcome: Progressing Goal: Diagnostic test results will improve Outcome: Progressing Goal: Respiratory complications will improve Outcome: Progressing Goal: Cardiovascular complication will be avoided Outcome: Progressing   Problem: Coping: Goal: Level of anxiety will decrease Outcome: Progressing   Problem: Nutrition: Goal: Adequate nutrition will be maintained Outcome: Progressing   Problem: Elimination: Goal: Will not experience complications related to bowel motility Outcome: Progressing Goal: Will not experience complications related to urinary retention Outcome: Progressing

## 2023-12-24 NOTE — Progress Notes (Addendum)
     Patient Name: JAHLANI LORENTZ           DOB: January 25, 1951  MRN: [id number]      Admission Date: 12/22/2023  Attending Provider: [name], DO  Primary Diagnosis: Intractable nausea and vomiting   Level of care: Progressive   OVERNIGHT EVENT  HPI/ Events of Note ANIS DEGIDIO, 73 y.o. male, was admitted on 12/22/2023 for Intractable nausea and vomiting.   Nursing staff concerned with patient's sporadic episodes of paranoia and visual hallucinations. Patient is alert and oriented x 3 and is able to describe these events.    Patient describes seeing  disfigured faces that make people look like monsters.  RN reports that when these event happen, the patient becomes anxious, restless, paranoid.    Denies alcohol and drug use. UA negative. Head CT negative.  Tonight, he had another episode and became frightened and was yelling anxiously.     Plan: CMP, CBC, TSH, ammonia, folic acid, thiamine, vitamin B12,  Avoid polypharmacy and deliriogenic medications when possible Brain MRI  Ativan   Melatonin for sleep cycle    Freemon Binford, DNP, ACNPC- AG [facility]

## 2023-12-24 NOTE — Progress Notes (Signed)
 Subjective: Patient was very emotional, tearful.  His wife and son were present at bedside and it appears patient has had visual hallucinations with sporadic episodes of paranoia.  It seems patient looks in 1 direction and appears unfocused.  He appears anxious and scared.  Objective: Vital signs in last 24 hours: Temp:  [97.7 F (36.5 C)-98.5 F (36.9 C)] 97.7 F (36.5 C) (11/02 0546) Pulse Rate:  [58-72] 58 (11/02 1013) Resp:  [14-20] 18 (11/02 1013) BP: (107-132)/(54-72) 115/72 (11/02 1013) SpO2:  [94 %-96 %] 94 % (11/02 1013) Weight change:  Last BM Date : 12/20/23  PE: Anxious, tearful GENERAL: No pallor, no icterus ABDOMEN: Soft, nondistended, nontender, normoactive bowel sounds EXTREMITIES: No deformity  Lab Results: Results for orders placed or performed during the hospital encounter of 12/22/23 (from the past 48 hours)  Glucose, capillary     Status: None   Collection Time: 12/22/23  9:39 PM  Result Value Ref Range   Glucose-Capillary 98 70 - 99 mg/dL    Comment: Glucose reference range applies only to samples taken after fasting for at least 8 hours.  CBC with Differential     Status: Abnormal   Collection Time: 12/23/23  5:40 AM  Result Value Ref Range   WBC 10.9 (H) 4.0 - 10.5 K/uL   RBC 4.36 4.22 - 5.81 MIL/uL   Hemoglobin 13.4 13.0 - 17.0 g/dL   HCT 58.0 60.9 - 47.9 %   MCV 96.1 80.0 - 100.0 fL   MCH 30.7 26.0 - 34.0 pg   MCHC 32.0 30.0 - 36.0 g/dL   RDW 86.8 88.4 - 84.4 %   Platelets 198 150 - 400 K/uL   nRBC 0.0 0.0 - 0.2 %   Neutrophils Relative % 78 %   Neutro Abs 8.6 (H) 1.7 - 7.7 K/uL   Lymphocytes Relative 14 %   Lymphs Abs 1.5 0.7 - 4.0 K/uL   Monocytes Relative 7 %   Monocytes Absolute 0.7 0.1 - 1.0 K/uL   Eosinophils Relative 1 %   Eosinophils Absolute 0.1 0.0 - 0.5 K/uL   Basophils Relative 0 %   Basophils Absolute 0.0 0.0 - 0.1 K/uL   Immature Granulocytes 0 %   Abs Immature Granulocytes 0.04 0.00 - 0.07 K/uL    Comment: Performed at  [facility], [address] [place], [place] [address]  Comprehensive metabolic panel     Status: Abnormal   Collection Time: 12/23/23  5:40 AM  Result Value Ref Range   Sodium 138 135 - 145 mmol/L   Potassium 4.3 3.5 - 5.1 mmol/L    Comment: Delta check noted    Chloride 103 98 - 111 mmol/L   CO2 23 22 - 32 mmol/L   Glucose, Bld 93 70 - 99 mg/dL    Comment: Glucose reference range applies only to samples taken after fasting for at least 8 hours.   BUN 15 8 - 23 mg/dL   Creatinine, Ser 9.11 0.61 - 1.24 mg/dL   Calcium  8.6 (L) 8.9 - 10.3 mg/dL   Total Protein 5.3 (L) 6.5 - 8.1 g/dL   Albumin 3.3 (L) 3.5 - 5.0 g/dL   AST 25 15 - 41 U/L    Comment: HEMOLYSIS AT THIS LEVEL MAY AFFECT RESULT   ALT 7 0 - 44 U/L   Alkaline Phosphatase 44 38 - 126 U/L   Total Bilirubin 0.9 0.0 - 1.2 mg/dL   GFR, Estimated >39 >39 mL/min    Comment: (NOTE) Calculated  using the CKD-EPI Creatinine Equation (2021)    Anion gap 13 5 - 15    Comment: Performed at [facility], [address] [place], [place] [address]  Magnesium      Status: Abnormal   Collection Time: 12/23/23  5:40 AM  Result Value Ref Range   Magnesium  1.4 (L) 1.7 - 2.4 mg/dL    Comment: Performed at [facility], [address], [place], [place] [address]  Procalcitonin     Status: None   Collection Time: 12/23/23  5:40 AM  Result Value Ref Range   Procalcitonin <0.10 ng/mL    Comment: (NOTE)   Sepsis PCT Algorithm          Lower Respiratory Tract Infection                                         PCT Algorithm -----------------------------------------------------------------  <0.5 ng/mL                    <0.10 ng/mL  Associated with low           Antibiotic therapy strongly   risk for progression          discouraged. Indicates absence   to severe sepsis              of bacteria infection  and/or septic shock              --------------------------------------------------------------  0.5-2.0 ng/mL                 0.10-0.25 ng/mL  Recommended to retest         Antibiotic therapy discouraged.  PCT within 6-24 hours         Bacterial infection unlikely  ------------------------------------------------------------  >2 ng/mL                      0.26-0.50 ng/mL  Associated with high risk     Antibiotic therapy encouraged.  for progression to severe     Bacterial infection possible  sepsis/and or septic shock    ------------------------------                                 >0.50 ng/mL                                Antibiotic therapy strongly                                 encouraged.                                Suggestive of presence of                                 bacterial infection.                                 -------------------------------------------------------------------  < or = 0.50 ng/mL OR          <  or = 0.25 OR 80% decrease in PCT  80% decrease in PCT           Antibiotic therapy   Antibiotic therapy may        may be discontinued  be discontinued                                 Performed at [facility], [address], [place], [place] [address]   Glucose, capillary     Status: None   Collection Time: 12/23/23  7:18 AM  Result Value Ref Range   Glucose-Capillary 87 70 - 99 mg/dL    Comment: Glucose reference range applies only to samples taken after fasting for at least 8 hours.  Glucose, capillary     Status: Abnormal   Collection Time: 12/23/23 11:38 AM  Result Value Ref Range   Glucose-Capillary 115 (H) 70 - 99 mg/dL    Comment: Glucose reference range applies only to samples taken after fasting for at least 8 hours.  Glucose, capillary     Status: Abnormal   Collection Time: 12/23/23  5:11 PM  Result Value Ref Range   Glucose-Capillary 158 (H) 70 - 99 mg/dL    Comment: Glucose reference range applies only to samples taken after fasting for at least  8 hours.  Glucose, capillary     Status: Abnormal   Collection Time: 12/23/23  8:27 PM  Result Value Ref Range   Glucose-Capillary 154 (H) 70 - 99 mg/dL    Comment: Glucose reference range applies only to samples taken after fasting for at least 8 hours.  Comprehensive metabolic panel     Status: Abnormal   Collection Time: 12/24/23  5:30 AM  Result Value Ref Range   Sodium 139 135 - 145 mmol/L   Potassium 3.4 (L) 3.5 - 5.1 mmol/L    Comment: Delta check noted    Chloride 105 98 - 111 mmol/L   CO2 26 22 - 32 mmol/L   Glucose, Bld 115 (H) 70 - 99 mg/dL    Comment: Glucose reference range applies only to samples taken after fasting for at least 8 hours.   BUN 12 8 - 23 mg/dL   Creatinine, Ser 9.35 0.61 - 1.24 mg/dL   Calcium  8.5 (L) 8.9 - 10.3 mg/dL   Total Protein 5.0 (L) 6.5 - 8.1 g/dL   Albumin 3.1 (L) 3.5 - 5.0 g/dL   AST 28 15 - 41 U/L   ALT 8 0 - 44 U/L   Alkaline Phosphatase 45 38 - 126 U/L   Total Bilirubin 0.5 0.0 - 1.2 mg/dL   GFR, Estimated >39 >39 mL/min    Comment: (NOTE) Calculated using the CKD-EPI Creatinine Equation (2021)    Anion gap 7 5 - 15    Comment: Performed at [facility], [address], [place], [place] [address]  TSH     Status: None   Collection Time: 12/24/23  5:30 AM  Result Value Ref Range   TSH 2.640 0.350 - 4.500 uIU/mL    Comment: Performed at [facility], [address], [place], [place] [address]  Ammonia     Status: Abnormal   Collection Time: 12/24/23  5:30 AM  Result Value Ref Range   Ammonia 41 (H) 9 - 35 umol/L    Comment: Performed at [facility], [address] [place], [place] [address]  Vitamin  B12     Status: Abnormal   Collection Time: 12/24/23  5:30 AM  Result Value Ref Range   Vitamin B-12 1,276 (H) 180 - 914 pg/mL    Comment: Performed at [facility], [address], [place], [place] [address]  Folate     Status: None   Collection Time:  12/24/23  5:30 AM  Result Value Ref Range   Folate >20.0 >5.9 ng/mL    Comment: Performed at [facility], [address], [place], [place] [address]  Glucose, capillary     Status: Abnormal   Collection Time: 12/24/23  7:38 AM  Result Value Ref Range   Glucose-Capillary 101 (H) 70 - 99 mg/dL    Comment: Glucose reference range applies only to samples taken after fasting for at least 8 hours.  CBC with Differential/Platelet     Status: None   Collection Time: 12/24/23  8:43 AM  Result Value Ref Range   WBC 6.4 4.0 - 10.5 K/uL   RBC 4.62 4.22 - 5.81 MIL/uL   Hemoglobin 13.9 13.0 - 17.0 g/dL   HCT 55.8 60.9 - 47.9 %   MCV 95.5 80.0 - 100.0 fL   MCH 30.1 26.0 - 34.0 pg   MCHC 31.5 30.0 - 36.0 g/dL   RDW 86.9 88.4 - 84.4 %   Platelets 181 150 - 400 K/uL   nRBC 0.0 0.0 - 0.2 %   Neutrophils Relative % 71 %   Neutro Abs 4.5 1.7 - 7.7 K/uL   Lymphocytes Relative 18 %   Lymphs Abs 1.2 0.7 - 4.0 K/uL   Monocytes Relative 8 %   Monocytes Absolute 0.5 0.1 - 1.0 K/uL   Eosinophils Relative 2 %   Eosinophils Absolute 0.2 0.0 - 0.5 K/uL   Basophils Relative 1 %   Basophils Absolute 0.0 0.0 - 0.1 K/uL   Immature Granulocytes 0 %   Abs Immature Granulocytes 0.02 0.00 - 0.07 K/uL    Comment: Performed at [facility], [address], [place], [place] [address]  Glucose, capillary     Status: None   Collection Time: 12/24/23 11:51 AM  Result Value Ref Range   Glucose-Capillary 88 70 - 99 mg/dL    Comment: Glucose reference range applies only to samples taken after fasting for at least 8 hours.    Studies/Results: MR BRAIN WO CONTRAST Result Date: 12/24/2023 EXAM: MRI BRAIN WITHOUT CONTRAST 12/24/2023 09:58:00 AM TECHNIQUE: Multiplanar multisequence MRI of the head/brain was performed without the administration of intravenous contrast. COMPARISON: Head CT 12/22/2023 and MRI 07/27/2023. CLINICAL HISTORY: Neuro deficit, acute, stroke suspected. FINDINGS:  The examination is mildly motion degraded. BRAIN AND VENTRICLES: There is no evidence of an acute infarct, intracranial hemorrhage, mass, midline shift, hydrocephalus, or extra-axial fluid collection. There is mild cerebral atrophy. No significant white matter disease is seen for age. ORBITS: Bilateral cataract extraction. SINUSES AND MASTOIDS: Mucous retention cysts in the maxillary sinuses. Clear mastoid air cells. BONES AND SOFT TISSUES: Normal marrow signal. No acute soft tissue abnormality. IMPRESSION: 1. No acute intracranial abnormality. Electronically signed by: Dasie Hamburg MD 12/24/2023 10:21 AM EST RP Workstation: [id number]    Medications: I have reviewed the patient's current medications.  Assessment: Nausea and vomiting of unclear etiology CT abdomen pelvis: Unremarkable except scattered sigmoid diverticulosis, anastomotic staple near rectosigmoid junction Colonoscopy 6/25: Multiple adenomas removed, repeat recommended in 1 year EGD 6/25: Unremarkable biopsies for H. pylori or celiac  New onset of hallucinations, paranoid behavior  MRI unremarkable  Elevated ammonia 41 Hypokalemia, 3.4 Mild malnutrition, albumin 3.1, total protein 5  Plan: I have ordered HIDA with EF to complete workup as I am unsure why patient has nausea and vomiting.  I discussed with patient's hospitalist Dr. Sherrill regarding change in patient's behavior, new onset hallucinations and paranoid behavior, and that he may benefit from neurology and psychiatric evaluation.  Dr. Burnette will follow in a.m.  Estelita Manas, MD 12/24/2023, 12:01 PM

## 2023-12-24 NOTE — Progress Notes (Signed)
 PROGRESS NOTE    Ray Brown  FMW:[id number] DOB: [birth year] DOA: 12/22/2023 PCP: Loreli Kins, MD   Brief Narrative:  Ray Brown is a 73 y.o. male with medical history significant of osteoarthritis, spinal stenosis, bronchitis, chronic back pain, CAD, history of unstable angina episode, hypertension, history of hypertensive heart disease, hyperlipidemia, aortic atherosclerosis, peripheral vascular disease, history of pneumonia, sleep apnea on CPAP, moderate protein malnutrition, history of unspecified class obesity, type 2 diabetes, GERD, hiatal hernia, Barrett's esophagus, diverticulosis/diverticulitis, constipation,, colon polyp, history of BPH who presents emergency department complaints of nausea and numerous episodes of emesis since Thursday of last week.  Gastroenterology was consulted and he underwent CT of the head without contrast and as well as CTA of the chest and abdomen/pelvis as below. Still having some Nausea but diet being advanced. GI obtaining HIDA Scan and still pending to be done.   Now having sporadic paranoia and visual hallucinations so discussed the case with neurology who recommended getting further studies as below.  Also obtaining a psychiatric consult.   Assessment and Plan:  Intractable Nausea and Vomiting Superimposed on  Gastroesophageal reflux disease without esophagitis Associated with Barrett's esophagus. Admit to PCU/inpatient. Continue IV fluids with LR at 75 mL/hr. CT Abd/Pelvis done and showed No acute findings in the abdomen or pelvis. Resolution of prior inflammatory changes around the pancreatic tail. Moderate prostatomegaly. Scattered sigmoid diverticulosis without evidence of diverticulitis. Patent aortic graft. GI Consulted and appreciate evaluation and checking a HIDA Scan w/ EF and pending. Repeat CBC CMP in the AM. CLD increased to D3 Diet with Thin Liquids; Will make NPO for Midnight for HIDA in the AM  Dizziness: Added Meclizine 25 mg  po Daily PRN and Scopolamine Patch. Head CT done w/o Contrast and showed No acute Intracranial Abnormality; Check MRI of Brain.  GI believes his nausea vomiting and dizziness is related to inner ear disease and recommending ENT evaluation. TSH was 2.640. Check EEG, adding High Dose Thiamine as below and checking CTA Head and Neck.   Hallucinations and Sporadic Paranoia and Emotional Lability with Prosopometaorphosia: MRI done and Negative. D/w Neurology who recommends EEG, CTA Head and Neck and High Dose Thiamine. Consulted Psychiatry as patient having visual hallucinations and distorted faces and demonic figures. Ammonia Level was 41 and mildly elevated -Avoid Polypharmacy and C/w Delirium Precautions  Hypokalemia: K+ is now 3.4. Replete w/ po KCL 40 mEQ BID. CTM and Replete as Necessary. Repeat CMP in the AM   Hypomagnesemia: Mag Level was 1.4. Replete w/ IV Mag Sulfate 4 grams. CTM and Replete as Necessary. Repeat Mag Level in the AM   Essential Hypertension: Has had significant weight loss. Currently not on antihypertensives except Metoprolol  Succinate 25 mg po Daily. CTM BP per Protocol. Last BP reading was 115/72   Peripheral vascular disease: Initially held DAPT but now will resume ASA and continue to Hold Plavix . GI unlikely to repeat endoscopic studies needed given recent EGD and Colonoscopy in June. GI recommending obtaining HIDA Scan and awaiting results of this prior to resuming Clopidogrel    Dyslipidemia: Resumed Fenofibrate  160 mg po at bedtime and Rosuvastatin 20 mg po Daily    OSA (obstructive sleep apnea): Currently not using CPAP   Hypoalbuminemia: Patient's Albumin Lvl went from 4.2 -> 3.3 -> 3.1. CTM and Replete as Necessary. Repeat CMP in the AM:  Weight Loss: Nutritionist Consult. CT Abd/Pelvis as above. CTA Chest PE Protocol showed No evidence of pulmonary embolism, Coronary and aortic plaque, and Pulmonary emphysema;  Resume Ensure Plus TID   DVT prophylaxis: SCDs Start:  12/22/23 1554    Code Status: Full Code Family Communication: D/w wife @ bedside  Disposition Plan:  Level of care: Progressive Status is: Inpatient Remains inpatient appropriate because: Needs further clinical improvement and clearance by the specialists    Consultants:  GI Discussed w/ Neurology Psychiatry  Procedures:  As delineated as above   Antimicrobials:  Anti-infectives (From admission, onward)    Start     Dose/Rate Route Frequency Ordered Stop   12/23/23 0200  vancomycin (VANCOREADY) IVPB 1750 mg/350 mL  Status:  Discontinued        1,750 mg 175 mL/hr over 120 Minutes Intravenous Every 24 hours 12/22/23 1607 12/23/23 1841   12/22/23 1700  ceFEPIme  (MAXIPIME ) 2 g in sodium chloride  0.9 % 100 mL IVPB  Status:  Discontinued        2 g 200 mL/hr over 30 Minutes Intravenous Every 8 hours 12/22/23 1607 12/23/23 1841   12/22/23 1600  metroNIDAZOLE  (FLAGYL ) IVPB 500 mg  Status:  Discontinued        500 mg 100 mL/hr over 60 Minutes Intravenous 2 times daily 12/22/23 1553 12/23/23 1841   12/22/23 0400  ceFEPIme  (MAXIPIME ) 2 g in sodium chloride  0.9 % 100 mL IVPB        2 g 200 mL/hr over 30 Minutes Intravenous  Once 12/22/23 0346 12/22/23 0500   12/22/23 0400  metroNIDAZOLE  (FLAGYL ) IVPB 500 mg        500 mg 100 mL/hr over 60 Minutes Intravenous  Once 12/22/23 0346 12/22/23 0616   12/22/23 0400  vancomycin (VANCOCIN) IVPB 1000 mg/200 mL premix        1,000 mg 200 mL/hr over 60 Minutes Intravenous  Once 12/22/23 0346 12/22/23 0713       Subjective: Seen and examined at bedside and he was having some visual hallucinations and sporadic paranoia.  Still having some nausea but wanting his diet advanced.  Chronic abdominal discomfort.  Appeared very tearful and emotionally labile.  Wanting to go home.  No other concerns or complaints this time.  Objective: Vitals:   12/23/23 1357 12/23/23 2028 12/24/23 0546 12/24/23 1013  BP: (!) 107/54 132/62 122/65 115/72  Pulse: 64 72  62 (!) 58  Resp: 14 16 20 18   Temp: 97.9 F (36.6 C) 98.5 F (36.9 C) 97.7 F (36.5 C)   TempSrc: Oral     SpO2: 95% 94% 96% 94%  Weight:      Height:        Intake/Output Summary (Last 24 hours) at 12/24/2023 1513 Last data filed at 12/24/2023 0600 Gross per 24 hour  Intake 1697.96 ml  Output --  Net 1697.96 ml   Filed Weights   12/22/23 1251  Weight: 71 kg   Examination: Physical Exam:  Constitutional: WN/WD, chronically ill-appearing Caucasian male who appears very anxious and tearful Respiratory: Diminished to auscultation bilaterally, no wheezing, rales, rhonchi or crackles. Normal respiratory effort and patient is not tachypenic. No accessory muscle use.  Breathing Cardiovascular: RRR, no murmurs / rubs / gallops. S1 and S2 auscultated. No extremity edema. Abdomen: Soft, mildly-tender, non-distended. Bowel sounds positive.  GU: Deferred. Musculoskeletal: No clubbing / cyanosis of digits/nails. No joint deformity upper and lower extremities.  Skin: No rashes, lesions, ulcers, or a limited skin evaluation. No induration; Warm and dry.  Neurologic: CN 2-12 grossly intact with no focal deficits. Romberg sign and cerebellar reflexes not assessed.  Psychiatric: Normal judgment and insight.  Alert and oriented x 3. Normal mood and appropriate affect.   Data Reviewed: I have personally reviewed following labs and imaging studies  CBC: Recent Labs  Lab 12/22/23 0358 12/23/23 0540 12/24/23 0843  WBC 12.2* 10.9* 6.4  NEUTROABS 11.2* 8.6* 4.5  HGB 16.5 13.4 13.9  HCT 50.4 41.9 44.1  MCV 93.5 96.1 95.5  PLT 240 198 181   Basic Metabolic Panel: Recent Labs  Lab 12/22/23 0358 12/22/23 0900 12/23/23 0540 12/24/23 0530  NA 144 143 138 139  K 3.2* 3.3* 4.3 3.4*  CL 100 102 103 105  CO2 20* 21* 23 26  GLUCOSE 146* 132* 93 115*  BUN 11 11 15 12   CREATININE 0.82 0.83 0.88 0.64  CALCIUM  9.9 9.2 8.6* 8.5*  MG  --  <0.5* 1.4*  --   PHOS  --  2.8  --   --     GFR: Estimated Creatinine Clearance: 78.4 mL/min (by C-G formula based on SCr of 0.64 mg/dL). Liver Function Tests: Recent Labs  Lab 12/22/23 0358 12/23/23 0540 12/24/23 0530  AST 21 25 28   ALT 10 7 8   ALKPHOS 58 44 45  BILITOT 0.7 0.9 0.5  PROT 6.7 5.3* 5.0*  ALBUMIN 4.2 3.3* 3.1*   Recent Labs  Lab 12/22/23 0900  LIPASE 11   Recent Labs  Lab 12/24/23 0530  AMMONIA 41*   Coagulation Profile: Recent Labs  Lab 12/22/23 0347  INR 1.1   Cardiac Enzymes: No results for input(s): CKTOTAL, CKMB, CKMBINDEX, TROPONINI in the last 168 hours. BNP (last 3 results) No results for input(s): PROBNP in the last 8760 hours. HbA1C: No results for input(s): HGBA1C in the last 72 hours. CBG: Recent Labs  Lab 12/23/23 1138 12/23/23 1711 12/23/23 2027 12/24/23 0738 12/24/23 1151  GLUCAP 115* 158* 154* 101* 88   Lipid Profile: No results for input(s): CHOL, HDL, LDLCALC, TRIG, CHOLHDL, LDLDIRECT in the last 72 hours. Thyroid Function Tests: Recent Labs    12/24/23 0530  TSH 2.640   Anemia Panel: Recent Labs    12/24/23 0530  VITAMINB12 1,276*  FOLATE >20.0   Sepsis Labs: Recent Labs  Lab 12/22/23 0404 12/22/23 0622 12/22/23 0900 12/22/23 0931 12/22/23 1254 12/23/23 0540  PROCALCITON  --   --  <0.10  --   --  <0.10  LATICACIDVEN 1.4 2.7*  --  1.6 1.4  --     Recent Results (from the past 240 hours)  Resp panel by RT-PCR (RSV, Flu A&B, Covid)     Status: None   Collection Time: 12/22/23  3:47 AM   Specimen: Nasal Swab  Result Value Ref Range Status   SARS Coronavirus 2 by RT PCR NEGATIVE NEGATIVE Final    Comment: (NOTE) SARS-CoV-2 target nucleic acids are NOT DETECTED.  The SARS-CoV-2 RNA is generally detectable in upper respiratory specimens during the acute phase of infection. The lowest concentration of SARS-CoV-2 viral copies this assay can detect is 138 copies/mL. A negative result does not preclude SARS-Cov-2 infection  and should not be used as the sole basis for treatment or other patient management decisions. A negative result may occur with  improper specimen collection/handling, submission of specimen other than nasopharyngeal swab, presence of viral mutation(s) within the areas targeted by this assay, and inadequate number of viral copies(<138 copies/mL). A negative result must be combined with clinical observations, patient history, and epidemiological information. The expected result is Negative.  Fact Sheet for Patients:  bloggercourse.com  Fact Sheet for Healthcare Providers:  seriousbroker.it  This test is no t yet approved or cleared by the [place]  FDA and  has been authorized for detection and/or diagnosis of SARS-CoV-2 by FDA under an Emergency Use Authorization (EUA). This EUA will remain  in effect (meaning this test can be used) for the duration of the COVID-19 declaration under Section 564(b)(1) of the Act, 21 U.S.C.section 360bbb-3(b)(1), unless the authorization is terminated  or revoked sooner.       Influenza A by PCR NEGATIVE NEGATIVE Final   Influenza B by PCR NEGATIVE NEGATIVE Final    Comment: (NOTE) The Xpert Xpress SARS-CoV-2/FLU/RSV plus assay is intended as an aid in the diagnosis of influenza from Nasopharyngeal swab specimens and should not be used as a sole basis for treatment. Nasal washings and aspirates are unacceptable for Xpert Xpress SARS-CoV-2/FLU/RSV testing.  Fact Sheet for Patients: bloggercourse.com  Fact Sheet for Healthcare Providers: seriousbroker.it  This test is not yet approved or cleared by the [place]  FDA and has been authorized for detection and/or diagnosis of SARS-CoV-2 by FDA under an Emergency Use Authorization (EUA). This EUA will remain in effect (meaning this test can be used) for the duration of the COVID-19 declaration  under Section 564(b)(1) of the Act, 21 U.S.C. section 360bbb-3(b)(1), unless the authorization is terminated or revoked.     Resp Syncytial Virus by PCR NEGATIVE NEGATIVE Final    Comment: (NOTE) Fact Sheet for Patients: bloggercourse.com  Fact Sheet for Healthcare Providers: seriousbroker.it  This test is not yet approved or cleared by the [place]  FDA and has been authorized for detection and/or diagnosis of SARS-CoV-2 by FDA under an Emergency Use Authorization (EUA). This EUA will remain in effect (meaning this test can be used) for the duration of the COVID-19 declaration under Section 564(b)(1) of the Act, 21 U.S.C. section 360bbb-3(b)(1), unless the authorization is terminated or revoked.  Performed at [facility], [address] [place], [place] [address]   Blood culture (routine x 2)     Status: None (Preliminary result)   Collection Time: 12/22/23  3:58 AM   Specimen: BLOOD  Result Value Ref Range Status   Specimen Description   Final    BLOOD LEFT ANTECUBITAL Performed at [facility], [address] [place], [place] [address]    Special Requests   Final    Blood Culture results may not be optimal due to an inadequate volume of blood received in culture bottles BOTTLES DRAWN AEROBIC AND ANAEROBIC Performed at [facility], [address], [place], [place] [address]    Culture   Final    NO GROWTH 2 DAYS Performed at [facility], [address], [place], [place] [address]    Report Status PENDING  Incomplete  Blood culture (routine x 2)     Status: None (Preliminary result)   Collection Time: 12/22/23  4:26 AM   Specimen: BLOOD  Result Value Ref Range Status   Specimen Description   Final    BLOOD RIGHT ANTECUBITAL Performed at [facility], [address], [place], [place] [address]    Special Requests   Final    Blood  Culture results may not be optimal due to an inadequate volume of blood received in culture bottles BOTTLES DRAWN AEROBIC AND ANAEROBIC Performed at [facility], [address], [place], [place] [address]    Culture   Final    NO GROWTH 2 DAYS Performed at [facility], [address], [place], [place] [address]    Report Status PENDING  Incomplete    Radiology Studies: CT ANGIO HEAD NECK W WO CM Result Date: 12/24/2023 EXAM: CTA Head and Neck with and without Intravenous Contrast. CT Head without Intravenous Contrast. CLINICAL HISTORY: 73 year old male. Neuro deficit, acute, stroke suspected; Vertigo, central. TECHNIQUE: Axial CTA images of the head and neck performed with and without intravenous contrast. MIP reconstructed images were created and reviewed. Axial computed tomography images of the head/brain performed without intravenous contrast. Note: Per PQRS, the description of internal carotid artery percent stenosis, including 0 percent or normal exam, is based on North American Symptomatic Carotid Endarterectomy Trial (NASCET) criteria. Dose reduction technique was used including one or more of the following: automated exposure control, adjustment of mA and kV according to patient size, and/or iterative reconstruction. CONTRAST: With and without; 80 mL (iohexol  (OMNIPAQUE ) 350 MG/ML injection 80 mL IOHEXOL  350 MG/ML SOLN). COMPARISON: Head CT 12/22/2023. Brain MRI 12/24/2023 09:44:00 AM. FINDINGS: CT HEAD: BRAIN: No acute intraparenchymal hemorrhage. No mass lesion. No CT evidence for acute territorial infarct. No midline shift or extra-axial collection. VENTRICLES: No hydrocephalus. ORBITS: The orbits are unremarkable. SINUSES AND MASTOIDS: The paranasal sinuses and mastoid air cells are clear. CTA NECK: COMMON CAROTID ARTERIES: Calcified aortic arch atherosclerosis. No significant brachiocephalic artery, right CCA, or left CCA atherosclerosis. No dissection or occlusion.  INTERNAL CAROTID ARTERIES: Right carotid bifurcation atherosclerosis is mostly calcified, no associated stenosis. Mild additional cervical right ICA and moderate intracranial right ICA calcified plaque. Moderate distal right ICA stenosis at the distal cavernous segment or anterior genu on series 12 image 63. Mild left carotid bifurcation mostly calcified plaque without stenosis. Left ICA siphon calcified plaque is moderate but only mild left siphon stenosis results. No dissection or occlusion. VERTEBRAL ARTERIES: 4 vessel arch configuration, left vertebral artery arises directly from the arch which is a normal variant. Mild proximal right subclavian artery calcified plaque without stenosis. Mild right vertebral artery origin calcified plaque without stenosis. Right vertebral artery is mildly non dominant to the level of the normal right PICA origin. The distal right V4 segment is diminutive, the right vertebral functionally terminates in the PICA. Left vertebral artery arises directly from the aortic arch with no origin plaque or stenosis. The left vertebral is mildly dominant, with a mildly delayed entry into the cervical transverse foramen on series 14 image 250. Left vertebral V3 and V4 segment calcified plaque with no hemodynamically significant stenosis. Normal left PICA origin. The left vertebral artery primarily supplies the basilar. No dissection or occlusion. CTA HEAD: ANTERIOR CEREBRAL ARTERIES: Left ACA A1 segment is dominant. Ectatic anterior communicating artery. Tortuous ACA A2 segments. Bilateral ACA branches are within normal limits. Normal left posterior communicating artery origin. No significant stenosis or occlusion for ACA. No aneurysm. MIDDLE CEREBRAL ARTERIES: MCA M1 segments and tortuous MCA bifurcations are patent without stenosis. Bilateral MCA branches are within normal limits. No occlusion. No aneurysm. POSTERIOR CEREBRAL ARTERIES: Fetal type bilateral PCA origins. Normal right posterior  communicating artery origin. Bilateral PCA branches are within normal limits. No significant stenosis or occlusion for PCA. No aneurysm. BASILAR ARTERY: Patent but diminutive basilar artery. No basilar stenosis. No occlusion. No aneurysm. OTHER: Major dural venous sinuses are enhancing and patent. OTHER: SOFT TISSUES: No acute finding. No masses or lymphadenopathy. BONES: Cervical spine degeneration, including facet arthropathy associated with multilevel mild degenerative cervical spondylolisthesis. No acute osseous abnormality. LUNGS AND PLEURA: Small volume retained secretions  in the distal trachea. Small layering pleural effusions. Centrilobular emphysema. Upper lung atelectasis, no upper lung consolidation. IMPRESSION: 1. Atherosclerosis with Moderate distal right ICA stenosis (RICA siphon). No other significant stenosis. 2. Emphysema, Small layering pleural effusions. 3. Stable CT appearance of the brain. Electronically signed by: Helayne Hurst MD 12/24/2023 02:34 PM EST RP Workstation: [id number]   MR BRAIN WO CONTRAST Result Date: 12/24/2023 EXAM: MRI BRAIN WITHOUT CONTRAST 12/24/2023 09:58:00 AM TECHNIQUE: Multiplanar multisequence MRI of the head/brain was performed without the administration of intravenous contrast. COMPARISON: Head CT 12/22/2023 and MRI 07/27/2023. CLINICAL HISTORY: Neuro deficit, acute, stroke suspected. FINDINGS: The examination is mildly motion degraded. BRAIN AND VENTRICLES: There is no evidence of an acute infarct, intracranial hemorrhage, mass, midline shift, hydrocephalus, or extra-axial fluid collection. There is mild cerebral atrophy. No significant white matter disease is seen for age. ORBITS: Bilateral cataract extraction. SINUSES AND MASTOIDS: Mucous retention cysts in the maxillary sinuses. Clear mastoid air cells. BONES AND SOFT TISSUES: Normal marrow signal. No acute soft tissue abnormality. IMPRESSION: 1. No acute intracranial abnormality. Electronically signed by: Dasie Hamburg MD 12/24/2023 10:21 AM EST RP Workstation: [id number]   Scheduled Meds:  aspirin  EC  81 mg Oral QHS   feeding supplement  237 mL Oral TID BM   fenofibrate   160 mg Oral QHS   metoprolol  succinate  25 mg Oral Daily   multivitamin with minerals  1 tablet Oral Daily   rosuvastatin  20 mg Oral Daily   scopolamine  1 patch Transdermal Q72H   [START ON 01/01/2024] thiamine (VITAMIN B1) injection  100 mg Intravenous Daily   Continuous Infusions:  lactated ringers  75 mL/hr at 12/24/23 1039   thiamine (VITAMIN B1) injection 500 mg (12/24/23 1416)   Followed by   NOREEN ON 12/26/2023] thiamine (VITAMIN B1) injection      LOS: 1 day   Alejandro Marker, DO [facility] Available via Epic secure chat 7am-7pm After these hours, please refer to coverage provider listed on [email or web address] 12/24/2023, 3:13 PM

## 2023-12-25 ENCOUNTER — Inpatient Hospital Stay (HOSPITAL_COMMUNITY)

## 2023-12-25 DIAGNOSIS — I739 Peripheral vascular disease, unspecified: Secondary | ICD-10-CM | POA: Diagnosis not present

## 2023-12-25 DIAGNOSIS — I10 Essential (primary) hypertension: Secondary | ICD-10-CM | POA: Diagnosis not present

## 2023-12-25 DIAGNOSIS — K219 Gastro-esophageal reflux disease without esophagitis: Secondary | ICD-10-CM | POA: Diagnosis not present

## 2023-12-25 DIAGNOSIS — R443 Hallucinations, unspecified: Secondary | ICD-10-CM

## 2023-12-25 DIAGNOSIS — G4733 Obstructive sleep apnea (adult) (pediatric): Secondary | ICD-10-CM

## 2023-12-25 DIAGNOSIS — E785 Hyperlipidemia, unspecified: Secondary | ICD-10-CM | POA: Diagnosis not present

## 2023-12-25 DIAGNOSIS — R112 Nausea with vomiting, unspecified: Secondary | ICD-10-CM | POA: Diagnosis not present

## 2023-12-25 LAB — COMPREHENSIVE METABOLIC PANEL WITH GFR
ALT: 8 U/L (ref 0–44)
AST: 25 U/L (ref 15–41)
Albumin: 3.2 g/dL — ABNORMAL LOW (ref 3.5–5.0)
Alkaline Phosphatase: 51 U/L (ref 38–126)
Anion gap: 7 (ref 5–15)
BUN: 7 mg/dL — ABNORMAL LOW (ref 8–23)
CO2: 27 mmol/L (ref 22–32)
Calcium: 8.9 mg/dL (ref 8.9–10.3)
Chloride: 108 mmol/L (ref 98–111)
Creatinine, Ser: 0.54 mg/dL — ABNORMAL LOW (ref 0.61–1.24)
GFR, Estimated: >60 mL/min (ref >60)
Glucose, Bld: 126 mg/dL — ABNORMAL HIGH (ref 70–99)
Potassium: 3.6 mmol/L (ref 3.5–5.1)
Sodium: 142 mmol/L (ref 135–145)
Total Bilirubin: 0.5 mg/dL (ref 0.0–1.2)
Total Protein: 5.2 g/dL — ABNORMAL LOW (ref 6.5–8.1)

## 2023-12-25 LAB — PHOSPHORUS: Phosphorus: 1.9 mg/dL — ABNORMAL LOW (ref 2.5–4.6)

## 2023-12-25 LAB — MAGNESIUM: Magnesium: 1.5 mg/dL — ABNORMAL LOW (ref 1.7–2.4)

## 2023-12-25 LAB — CBC WITH DIFFERENTIAL/PLATELET
Abs Immature Granulocytes: 0.01 K/uL (ref 0.00–0.07)
Basophils Absolute: 0 K/uL (ref 0.0–0.1)
Basophils Relative: 1 %
Eosinophils Absolute: 0.3 K/uL (ref 0.0–0.5)
Eosinophils Relative: 4 %
HCT: 43.3 % (ref 39.0–52.0)
Hemoglobin: 14 g/dL (ref 13.0–17.0)
Immature Granulocytes: 0 %
Lymphocytes Relative: 19 %
Lymphs Abs: 1.2 K/uL (ref 0.7–4.0)
MCH: 30.6 pg (ref 26.0–34.0)
MCHC: 32.3 g/dL (ref 30.0–36.0)
MCV: 94.7 fL (ref 80.0–100.0)
Monocytes Absolute: 0.5 K/uL (ref 0.1–1.0)
Monocytes Relative: 8 %
Neutro Abs: 4.3 K/uL (ref 1.7–7.7)
Neutrophils Relative %: 68 %
Platelets: 190 K/uL (ref 150–400)
RBC: 4.57 MIL/uL (ref 4.22–5.81)
RDW: 13 % (ref 11.5–15.5)
WBC: 6.3 K/uL (ref 4.0–10.5)
nRBC: 0 % (ref 0.0–0.2)

## 2023-12-25 LAB — GLUCOSE, CAPILLARY
Glucose-Capillary: 108 mg/dL — ABNORMAL HIGH (ref 70–99)
Glucose-Capillary: 113 mg/dL — ABNORMAL HIGH (ref 70–99)
Glucose-Capillary: 147 mg/dL — ABNORMAL HIGH (ref 70–99)
Glucose-Capillary: 148 mg/dL — ABNORMAL HIGH (ref 70–99)

## 2023-12-25 LAB — AMMONIA: Ammonia: 35 umol/L (ref 9–35)

## 2023-12-25 MED ORDER — TECHNETIUM TC 99M MEBROFENIN IV KIT
5.3500 | PACK | Freq: Once | INTRAVENOUS | Status: AC | PRN
  Administered 2023-12-25: 5.35 via INTRAVENOUS

## 2023-12-25 MED ORDER — LORAZEPAM 2 MG/ML IJ SOLN
1.0000 mg | Freq: Once | INTRAMUSCULAR | Status: AC | PRN
  Administered 2023-12-25: 1 mg via INTRAVENOUS
  Filled 2023-12-25: qty 1

## 2023-12-25 MED ORDER — QUETIAPINE FUMARATE 25 MG PO TABS
12.5000 mg | ORAL_TABLET | Freq: Every day | ORAL | Status: DC
  Administered 2023-12-25: 12.5 mg via ORAL
  Filled 2023-12-25: qty 1

## 2023-12-25 MED ORDER — LORAZEPAM 2 MG/ML IJ SOLN
0.5000 mg | Freq: Once | INTRAMUSCULAR | Status: AC
  Administered 2023-12-25: 0.5 mg via INTRAVENOUS
  Filled 2023-12-25: qty 1

## 2023-12-25 MED ORDER — POTASSIUM PHOSPHATES 15 MMOLE/5ML IV SOLN
30.0000 mmol | Freq: Once | INTRAVENOUS | Status: AC
  Administered 2023-12-25: 30 mmol via INTRAVENOUS
  Filled 2023-12-25: qty 10

## 2023-12-25 MED ORDER — MAGNESIUM SULFATE 4 GM/100ML IV SOLN
4.0000 g | Freq: Once | INTRAVENOUS | Status: AC
  Administered 2023-12-25: 4 g via INTRAVENOUS
  Filled 2023-12-25: qty 100

## 2023-12-25 NOTE — Plan of Care (Signed)

## 2023-12-25 NOTE — Progress Notes (Addendum)
       Overnight   NAME: Ray Brown MRN: [id number] DOB : 07/12/1950    Date of Service   12/25/2023   HPI/Events of Note    Notified by RN for continued hallucinations, with increased agitation, and fixation on male RN with some degree of aggression.  73 year old male medical history of osteoarthritis, spinal stenosis, bronchitis, chronic back pain, CAD, history of unstable angina, hypertension, hypertensive heart disease, hyperlipidemia, aortic arteriosclerosis, peripheral vascular disease, history of pneumonia, sleep apnea on CPAP, moderate protein malnutrition, history of unspecified obesity, DM type II, GERD, hiatal hernia, Barrett's esophagus, diverticulitis/diverticulosis, constipation, colon polyp, history of BPH.  Admitted through ER for numerous episodes of emesis since Thursday of last week. ---------------------------------------------------------------- Bedside visit   Notified originally at about 0000 hrs of continued visual hallucinations and anxiety/agitation of patient. Originally given Ativan  as well as previously, with little relief, a second dose was given to equal previous night total.  At about that time patient had became agitated, and fixated on aggression towards a single male nurse with other male staff in the room.  Patient expressed to Nursing staff that his wife's face appeared as a carved pumpkin to him.  Patient was telling male RN Go get your Robet? No known financial planner.   Patient reaching out for unknown objects. At approximately 30 minutes post second Ativan  dose, patient is more calm, and remaining in bed, wife is present. Only known new medications are (administered here) - Maxipime , Flagyl , Vancomycin, and within past 3 months - Jardiance    Psychiatry consult is previously ordered and pending.   Interventions/ Plan   Psychiatry is pending Repeat Ammonia . Maintain as calm of environment as able        Lynwood Kipper BSN  MSNA MSN ACNPC-AG Acute Care Nurse Practitioner [facility]

## 2023-12-25 NOTE — Progress Notes (Signed)
 Initial Nutrition Assessment  INTERVENTION:   -Ensure Plus High Protein po TID, each supplement provides 350 kcal and 20 grams of protein.   -Multivitamin with minerals daily  NUTRITION DIAGNOSIS:   Inadequate oral intake related to nausea, vomiting as evidenced by per patient/family report.  GOAL:   Patient will meet greater than or equal to 90% of their needs  MONITOR:   PO intake, Supplement acceptance  REASON FOR ASSESSMENT:   Consult Assessment of nutrition requirement/status, Poor PO  ASSESSMENT:   73 y.o. male with medical history significant of osteoarthritis, spinal stenosis, bronchitis, chronic back pain, CAD, history of unstable angina episode, hypertension, history of hypertensive heart disease, hyperlipidemia, aortic atherosclerosis, peripheral vascular disease, history of pneumonia, sleep apnea on CPAP, moderate protein malnutrition, history of unspecified class obesity, type 2 diabetes, GERD, hiatal hernia, Barrett's esophagus, diverticulosis/diverticulitis, constipation,, colon polyp, history of BPH who presents emergency department complaints of nausea and numerous episodes of emesis since Thursday of last week.  Patient unavailable, having HIDA scan today.  Per chart review, pt has been having N/V since 10/30. H/o GERD with esophagitis. Now with some hallucinations and agitation.  Ensure supplements were ordered, will continue. On MVI as well.   Per weight records, pt has lost 36 lbs since 01/24/23 (18% wt loss x 11 months, significant for time frame).  Medications: Thiamine, Multivitamin with minerals daily, IV Mg sulfate, K-Phos  Labs reviewed: CBGs: 108-190 Low Mg Low Phos   NUTRITION - FOCUSED PHYSICAL EXAM:  Unable to complete  Diet Order:   Diet Order             DIET SOFT Room service appropriate? Yes; Fluid consistency: Thin  Diet effective now                   EDUCATION NEEDS:   Not appropriate for education at this time  Skin:   Skin Assessment: Reviewed RN Assessment  Last BM:  11/2 -type 3  Height:   Ht Readings from Last 1 Encounters:  12/22/23 5' 8 (1.727 m)    Weight:   Wt Readings from Last 1 Encounters:  12/22/23 71 kg    BMI:  Body mass index is 23.8 kg/m.  Estimated Nutritional Needs:   Kcal:  2100-2300  Protein:  85-100g  Fluid:  2.1L/day   Morna Lee, MS, RD, LDN Inpatient Clinical Dietitian Contact via Secure chat\

## 2023-12-25 NOTE — Progress Notes (Signed)
 Spoke with RN to coordinate a good time for EEG today. Patient has a Hida Scan planned will check back in this afternoon to see if offsite EEG can be performed today.

## 2023-12-25 NOTE — Progress Notes (Signed)
 Awaiting HIDA results.  [facility] to revisit tomorrow.

## 2023-12-25 NOTE — Progress Notes (Signed)
 PROGRESS NOTE    Ray Brown  FMW:[id number] DOB: 03-Aug-1950 DOA: 12/22/2023 PCP: Loreli Kins, MD   Brief Narrative:  Ray Brown is a 73 y.o. male with medical history significant of osteoarthritis, spinal stenosis, bronchitis, chronic back pain, CAD, history of unstable angina episode, hypertension, history of hypertensive heart disease, hyperlipidemia, aortic atherosclerosis, peripheral vascular disease, history of pneumonia, sleep apnea on CPAP, moderate protein malnutrition, history of unspecified class obesity, type 2 diabetes, GERD, hiatal hernia, Barrett's esophagus, diverticulosis/diverticulitis, constipation,, colon polyp, history of BPH who presents emergency department complaints of nausea and numerous episodes of emesis since Thursday of last week.  Gastroenterology was consulted and he underwent CT of the head without contrast and as well as CTA of the chest and abdomen/pelvis as below. Still having some Nausea but diet being advanced. GI obtaining HIDA Scan and still pending to be done.   Now having sporadic paranoia and visual hallucinations so discussed the case with neurology who recommended getting further studies as below.  Also obtaining a psychiatric consult and being initiated on Seroquel.   Assessment and Plan:  Intractable Nausea and Vomiting Superimposed on  Gastroesophageal reflux disease without esophagitis Associated with Barrett's esophagus. Imprvoing. Admitted to PCU/inpatient. Continue IV fluids with LR at 75 mL/hr. CT Abd/Pelvis done and showed No acute findings in the abdomen or pelvis. Resolution of prior inflammatory changes around the pancreatic tail. Moderate prostatomegaly. Scattered sigmoid diverticulosis without evidence of diverticulitis. Patent aortic graft. GI Consulted and appreciate evaluation and checking a HIDA Scan w/ EF and pending. Repeat CBC CMP in the AM. CLD increased to D3 Diet with Thin Liquids; Was made NPO for Midnight for HIDA and  this was done and pending read. Back on Sof Diet   Dizziness: Added Meclizine 25 mg po Daily PRN and Scopolamine Patch. Head CT done w/o Contrast and showed No acute Intracranial Abnormality; Checked MRI of Brain and showed No acute intracranial abnormality.  GI believes his nausea vomiting and dizziness is related to inner ear disease and recommending ENT evaluation. TSH was 2.640. Check EEG and pending to be done, adding High Dose Thiamine as below and checking CTA Head and Neck was doe and showed Atherosclerosis with Moderate distal right ICA stenosis (RICA siphon). No other significant stenosis, Emphysema, Small layering pleural effusions and  Stable CT appearance of the brain.  Hallucinations and Sporadic Paranoia and Emotional Lability with Prosopometaorphosia: MRI done and Negative. D/w Neurology who recommends EEG, CTA Head and Neck and High Dose Thiamine (as above). Consulted Psychiatry as patient having visual hallucinations and distorted faces and demonic figures. Ammonia Level was 41 and mildly elevated and repeat was 35. -Avoid Polypharmacy and C/w Delirium Precautions -Psychiatry consulted and starting the patient on Quetiapine 12.5 mg po qHS  Hypokalemia: K+ is now 3.6. Replete w/ po KCL 40 mEQ BID. CTM and Replete as Necessary. Repeat CMP in the AM   Hypomagnesemia: Mag Level is now 1.5. Replete w/ IV Mag Sulfate 4 grams. CTM and Replete as Necessary. Repeat Mag Level in the AM  Hypophosphatemia: Phos Level is now 1.9. Replete w/ IV K Phos  30 mmol. CTM and Replete as Necessary. Repeat Phos Level in the AM   Essential Hypertension: Has had significant weight loss. Currently not on antihypertensives except Metoprolol  Succinate 25 mg po Daily. CTM BP per Protocol. Last BP reading was 139/79   Peripheral vascular disease: Initially held DAPT but now will resume ASA and continue to Hold Plavix . GI unlikely to repeat endoscopic  studies needed given recent EGD and Colonoscopy in June. GI  recommending obtaining HIDA Scan and awaiting results of this prior to resuming Clopidogrel ; HIDA done and pending read.    Dyslipidemia: Resumed Fenofibrate  160 mg po at bedtime and Rosuvastatin 20 mg po Daily    OSA (obstructive sleep apnea): Currently not using CPAP   Hypoalbuminemia: Patient's Albumin Lvl went from 4.2 -> 3.3 -> 3.1 -> 3.2. CTM and Replete as Necessary. Repeat CMP in the AM:  Weight Loss: Nutritionist Consult. CT Abd/Pelvis as above. CTA Chest PE Protocol showed No evidence of pulmonary embolism, Coronary and aortic plaque, and Pulmonary emphysema; Resume Ensure Plus TID   DVT prophylaxis: SCDs Start: 12/22/23 1554    Code Status: Full Code Family Communication: D/w Son and Daughter @ bedside   Disposition Plan:  Level of care: Progressive Status is: Inpatient Remains inpatient appropriate because: Needs further clinical improvement and clearance by the specialists    Consultants:  GI Psychiatry D/w Neurology   Procedures:  As delineated as above  Antimicrobials:  Anti-infectives (From admission, onward)    Start     Dose/Rate Route Frequency Ordered Stop   12/23/23 0200  vancomycin (VANCOREADY) IVPB 1750 mg/350 mL  Status:  Discontinued        1,750 mg 175 mL/hr over 120 Minutes Intravenous Every 24 hours 12/22/23 1607 12/23/23 1841   12/22/23 1700  ceFEPIme  (MAXIPIME ) 2 g in sodium chloride  0.9 % 100 mL IVPB  Status:  Discontinued        2 g 200 mL/hr over 30 Minutes Intravenous Every 8 hours 12/22/23 1607 12/23/23 1841   12/22/23 1600  metroNIDAZOLE  (FLAGYL ) IVPB 500 mg  Status:  Discontinued        500 mg 100 mL/hr over 60 Minutes Intravenous 2 times daily 12/22/23 1553 12/23/23 1841   12/22/23 0400  ceFEPIme  (MAXIPIME ) 2 g in sodium chloride  0.9 % 100 mL IVPB        2 g 200 mL/hr over 30 Minutes Intravenous  Once 12/22/23 0346 12/22/23 0500   12/22/23 0400  metroNIDAZOLE  (FLAGYL ) IVPB 500 mg        500 mg 100 mL/hr over 60 Minutes Intravenous   Once 12/22/23 0346 12/22/23 0616   12/22/23 0400  vancomycin (VANCOCIN) IVPB 1000 mg/200 mL premix        1,000 mg 200 mL/hr over 60 Minutes Intravenous  Once 12/22/23 0346 12/22/23 0713       Subjective: Seen and examined at bedside and and was still feeling dizzy but nausea has improved.  Still having visual hallucinations.  Having some mild left-sided tenderness as well.  Awaiting HIDA scan.  No other concerns or complaints this time.  Objective: Vitals:   12/24/23 0546 12/24/23 1013 12/24/23 2036 12/25/23 0627  BP: 122/65 115/72 (!) 123/56 139/79  Pulse: 62 (!) 58 72 66  Resp: 20 18 20  (!) 24  Temp: 97.7 F (36.5 C)  98.3 F (36.8 C) 97.6 F (36.4 C)  TempSrc:   Oral   SpO2: 96% 94% 94% 92%  Weight:      Height:        Intake/Output Summary (Last 24 hours) at 12/25/2023 1759 Last data filed at 12/25/2023 9297 Gross per 24 hour  Intake 723.41 ml  Output --  Net 723.41 ml   Filed Weights   12/22/23 1251  Weight: 71 kg   Examination: Physical Exam:  Constitutional: Thin elderly Caucasian male in no acute distress appears calm Respiratory: Diminished to  auscultation bilaterally, no wheezing, rales, rhonchi or crackles. Normal respiratory effort and patient is not tachypenic. No accessory muscle use.  Unlabored breathing Cardiovascular: RRR, no murmurs / rubs / gallops. S1 and S2 auscultated. No extremity edema.  Abdomen: Soft, slightly-tender, non-distended. Bowel sounds positive.  GU: Deferred. Musculoskeletal: No clubbing / cyanosis of digits/nails. No joint deformity upper and lower extremities.  Skin: No rashes, lesions, ulcers on a limited skin evaluation. No induration; Warm and dry.  Neurologic: CN 2-12 grossly intact with no focal deficits. Romberg sign and cerebellar reflexes not assessed.  Psychiatric: Normal judgment and insight. Alert and oriented x 3. Normal mood and appropriate affect.   Data Reviewed: I have personally reviewed following labs and imaging  studies  CBC: Recent Labs  Lab 12/22/23 0358 12/23/23 0540 12/24/23 0843 12/25/23 0722  WBC 12.2* 10.9* 6.4 6.3  NEUTROABS 11.2* 8.6* 4.5 4.3  HGB 16.5 13.4 13.9 14.0  HCT 50.4 41.9 44.1 43.3  MCV 93.5 96.1 95.5 94.7  PLT 240 198 181 190   Basic Metabolic Panel: Recent Labs  Lab 12/22/23 0358 12/22/23 0900 12/23/23 0540 12/24/23 0530 12/25/23 0722  NA 144 143 138 139 142  K 3.2* 3.3* 4.3 3.4* 3.6  CL 100 102 103 105 108  CO2 20* 21* 23 26 27   GLUCOSE 146* 132* 93 115* 126*  BUN 11 11 15 12  7*  CREATININE 0.82 0.83 0.88 0.64 0.54*  CALCIUM  9.9 9.2 8.6* 8.5* 8.9  MG  --  <0.5* 1.4* 1.9 1.5*  PHOS  --  2.8  --  1.8* 1.9*   GFR: Estimated Creatinine Clearance: 78.4 mL/min (A) (by C-G formula based on SCr of 0.54 mg/dL (L)). Liver Function Tests: Recent Labs  Lab 12/22/23 0358 12/23/23 0540 12/24/23 0530 12/25/23 0722  AST 21 25 28 25   ALT 10 7 8 8   ALKPHOS 58 44 45 51  BILITOT 0.7 0.9 0.5 0.5  PROT 6.7 5.3* 5.0* 5.2*  ALBUMIN 4.2 3.3* 3.1* 3.2*   Recent Labs  Lab 12/22/23 0900  LIPASE 11   Recent Labs  Lab 12/24/23 0530 12/25/23 0722  AMMONIA 41* 35   Coagulation Profile: Recent Labs  Lab 12/22/23 0347  INR 1.1   Cardiac Enzymes: No results for input(s): CKTOTAL, CKMB, CKMBINDEX, TROPONINI in the last 168 hours. BNP (last 3 results) No results for input(s): PROBNP in the last 8760 hours. HbA1C: No results for input(s): HGBA1C in the last 72 hours. CBG: Recent Labs  Lab 12/24/23 1626 12/24/23 2032 12/25/23 0726 12/25/23 1144 12/25/23 1630  GLUCAP 169* 190* 108* 113* 147*   Lipid Profile: No results for input(s): CHOL, HDL, LDLCALC, TRIG, CHOLHDL, LDLDIRECT in the last 72 hours. Thyroid Function Tests: Recent Labs    12/24/23 0530  TSH 2.640   Anemia Panel: Recent Labs    12/24/23 0530  VITAMINB12 1,276*  FOLATE >20.0   Sepsis Labs: Recent Labs  Lab 12/22/23 0404 12/22/23 0622 12/22/23 0900  12/22/23 0931 12/22/23 1254 12/23/23 0540  PROCALCITON  --   --  <0.10  --   --  <0.10  LATICACIDVEN 1.4 2.7*  --  1.6 1.4  --    Recent Results (from the past 240 hours)  Resp panel by RT-PCR (RSV, Flu A&B, Covid)     Status: None   Collection Time: 12/22/23  3:47 AM   Specimen: Nasal Swab  Result Value Ref Range Status   SARS Coronavirus 2 by RT PCR NEGATIVE NEGATIVE Final    Comment: (NOTE)  SARS-CoV-2 target nucleic acids are NOT DETECTED.  The SARS-CoV-2 RNA is generally detectable in upper respiratory specimens during the acute phase of infection. The lowest concentration of SARS-CoV-2 viral copies this assay can detect is 138 copies/mL. A negative result does not preclude SARS-Cov-2 infection and should not be used as the sole basis for treatment or other patient management decisions. A negative result may occur with  improper specimen collection/handling, submission of specimen other than nasopharyngeal swab, presence of viral mutation(s) within the areas targeted by this assay, and inadequate number of viral copies(<138 copies/mL). A negative result must be combined with clinical observations, patient history, and epidemiological information. The expected result is Negative.  Fact Sheet for Patients:  bloggercourse.com  Fact Sheet for Healthcare Providers:  seriousbroker.it  This test is no t yet approved or cleared by the [place]  FDA and  has been authorized for detection and/or diagnosis of SARS-CoV-2 by FDA under an Emergency Use Authorization (EUA). This EUA will remain  in effect (meaning this test can be used) for the duration of the COVID-19 declaration under Section 564(b)(1) of the Act, 21 U.S.C.section 360bbb-3(b)(1), unless the authorization is terminated  or revoked sooner.       Influenza A by PCR NEGATIVE NEGATIVE Final   Influenza B by PCR NEGATIVE NEGATIVE Final    Comment: (NOTE) The Xpert  Xpress SARS-CoV-2/FLU/RSV plus assay is intended as an aid in the diagnosis of influenza from Nasopharyngeal swab specimens and should not be used as a sole basis for treatment. Nasal washings and aspirates are unacceptable for Xpert Xpress SARS-CoV-2/FLU/RSV testing.  Fact Sheet for Patients: bloggercourse.com  Fact Sheet for Healthcare Providers: seriousbroker.it  This test is not yet approved or cleared by the [place]  FDA and has been authorized for detection and/or diagnosis of SARS-CoV-2 by FDA under an Emergency Use Authorization (EUA). This EUA will remain in effect (meaning this test can be used) for the duration of the COVID-19 declaration under Section 564(b)(1) of the Act, 21 U.S.C. section 360bbb-3(b)(1), unless the authorization is terminated or revoked.     Resp Syncytial Virus by PCR NEGATIVE NEGATIVE Final    Comment: (NOTE) Fact Sheet for Patients: bloggercourse.com  Fact Sheet for Healthcare Providers: seriousbroker.it  This test is not yet approved or cleared by the [place]  FDA and has been authorized for detection and/or diagnosis of SARS-CoV-2 by FDA under an Emergency Use Authorization (EUA). This EUA will remain in effect (meaning this test can be used) for the duration of the COVID-19 declaration under Section 564(b)(1) of the Act, 21 U.S.C. section 360bbb-3(b)(1), unless the authorization is terminated or revoked.  Performed at [facility], [address], [place], [place] [address]   Blood culture (routine x 2)     Status: None (Preliminary result)   Collection Time: 12/22/23  3:58 AM   Specimen: BLOOD  Result Value Ref Range Status   Specimen Description   Final    BLOOD LEFT ANTECUBITAL Performed at [facility], [address], [place], [place] [address]    Special Requests   Final    Blood  Culture results may not be optimal due to an inadequate volume of blood received in culture bottles BOTTLES DRAWN AEROBIC AND ANAEROBIC Performed at [facility], [address], [place], [place] [address]    Culture   Final    NO GROWTH 3 DAYS Performed at [facility], [address], [place], [place] [address]  Report Status PENDING  Incomplete  Blood culture (routine x 2)     Status: None (Preliminary result)   Collection Time: 12/22/23  4:26 AM   Specimen: BLOOD  Result Value Ref Range Status   Specimen Description   Final    BLOOD RIGHT ANTECUBITAL Performed at [facility], [address] [place], [place] [address]    Special Requests   Final    Blood Culture results may not be optimal due to an inadequate volume of blood received in culture bottles BOTTLES DRAWN AEROBIC AND ANAEROBIC Performed at [facility], [address], [place], [place] [address]    Culture   Final    NO GROWTH 3 DAYS Performed at [facility], [address], [place], [place] [address]    Report Status PENDING  Incomplete    Radiology Studies: CT ANGIO HEAD NECK W WO CM Result Date: 12/24/2023 EXAM: CTA Head and Neck with and without Intravenous Contrast. CT Head without Intravenous Contrast. CLINICAL HISTORY: 73 year old male. Neuro deficit, acute, stroke suspected; Vertigo, central. TECHNIQUE: Axial CTA images of the head and neck performed with and without intravenous contrast. MIP reconstructed images were created and reviewed. Axial computed tomography images of the head/brain performed without intravenous contrast. Note: Per PQRS, the description of internal carotid artery percent stenosis, including 0 percent or normal exam, is based on North American Symptomatic Carotid Endarterectomy Trial (NASCET) criteria. Dose reduction technique was used including one or more of the following: automated exposure control, adjustment of mA  and kV according to patient size, and/or iterative reconstruction. CONTRAST: With and without; 80 mL (iohexol  (OMNIPAQUE ) 350 MG/ML injection 80 mL IOHEXOL  350 MG/ML SOLN). COMPARISON: Head CT 12/22/2023. Brain MRI 12/24/2023 09:44:00 AM. FINDINGS: CT HEAD: BRAIN: No acute intraparenchymal hemorrhage. No mass lesion. No CT evidence for acute territorial infarct. No midline shift or extra-axial collection. VENTRICLES: No hydrocephalus. ORBITS: The orbits are unremarkable. SINUSES AND MASTOIDS: The paranasal sinuses and mastoid air cells are clear. CTA NECK: COMMON CAROTID ARTERIES: Calcified aortic arch atherosclerosis. No significant brachiocephalic artery, right CCA, or left CCA atherosclerosis. No dissection or occlusion. INTERNAL CAROTID ARTERIES: Right carotid bifurcation atherosclerosis is mostly calcified, no associated stenosis. Mild additional cervical right ICA and moderate intracranial right ICA calcified plaque. Moderate distal right ICA stenosis at the distal cavernous segment or anterior genu on series 12 image 63. Mild left carotid bifurcation mostly calcified plaque without stenosis. Left ICA siphon calcified plaque is moderate but only mild left siphon stenosis results. No dissection or occlusion. VERTEBRAL ARTERIES: 4 vessel arch configuration, left vertebral artery arises directly from the arch which is a normal variant. Mild proximal right subclavian artery calcified plaque without stenosis. Mild right vertebral artery origin calcified plaque without stenosis. Right vertebral artery is mildly non dominant to the level of the normal right PICA origin. The distal right V4 segment is diminutive, the right vertebral functionally terminates in the PICA. Left vertebral artery arises directly from the aortic arch with no origin plaque or stenosis. The left vertebral is mildly dominant, with a mildly delayed entry into the cervical transverse foramen on series 14 image 250. Left vertebral V3 and V4  segment calcified plaque with no hemodynamically significant stenosis. Normal left PICA origin. The left vertebral artery primarily supplies the basilar. No dissection or occlusion. CTA HEAD: ANTERIOR CEREBRAL ARTERIES: Left ACA A1 segment is dominant. Ectatic anterior communicating artery. Tortuous ACA A2 segments. Bilateral ACA branches are within normal limits.  Normal left posterior communicating artery origin. No significant stenosis or occlusion for ACA. No aneurysm. MIDDLE CEREBRAL ARTERIES: MCA M1 segments and tortuous MCA bifurcations are patent without stenosis. Bilateral MCA branches are within normal limits. No occlusion. No aneurysm. POSTERIOR CEREBRAL ARTERIES: Fetal type bilateral PCA origins. Normal right posterior communicating artery origin. Bilateral PCA branches are within normal limits. No significant stenosis or occlusion for PCA. No aneurysm. BASILAR ARTERY: Patent but diminutive basilar artery. No basilar stenosis. No occlusion. No aneurysm. OTHER: Major dural venous sinuses are enhancing and patent. OTHER: SOFT TISSUES: No acute finding. No masses or lymphadenopathy. BONES: Cervical spine degeneration, including facet arthropathy associated with multilevel mild degenerative cervical spondylolisthesis. No acute osseous abnormality. LUNGS AND PLEURA: Small volume retained secretions in the distal trachea. Small layering pleural effusions. Centrilobular emphysema. Upper lung atelectasis, no upper lung consolidation. IMPRESSION: 1. Atherosclerosis with Moderate distal right ICA stenosis (RICA siphon). No other significant stenosis. 2. Emphysema, Small layering pleural effusions. 3. Stable CT appearance of the brain. Electronically signed by: Helayne Hurst MD 12/24/2023 02:34 PM EST RP Workstation: [id number]   MR BRAIN WO CONTRAST Result Date: 12/24/2023 EXAM: MRI BRAIN WITHOUT CONTRAST 12/24/2023 09:58:00 AM TECHNIQUE: Multiplanar multisequence MRI of the head/brain was performed without the  administration of intravenous contrast. COMPARISON: Head CT 12/22/2023 and MRI 07/27/2023. CLINICAL HISTORY: Neuro deficit, acute, stroke suspected. FINDINGS: The examination is mildly motion degraded. BRAIN AND VENTRICLES: There is no evidence of an acute infarct, intracranial hemorrhage, mass, midline shift, hydrocephalus, or extra-axial fluid collection. There is mild cerebral atrophy. No significant white matter disease is seen for age. ORBITS: Bilateral cataract extraction. SINUSES AND MASTOIDS: Mucous retention cysts in the maxillary sinuses. Clear mastoid air cells. BONES AND SOFT TISSUES: Normal marrow signal. No acute soft tissue abnormality. IMPRESSION: 1. No acute intracranial abnormality. Electronically signed by: Dasie Hamburg MD 12/24/2023 10:21 AM EST RP Workstation: [id number]   Scheduled Meds:  aspirin  EC  81 mg Oral QHS   feeding supplement  237 mL Oral TID BM   fenofibrate   160 mg Oral QHS   metoprolol  succinate  25 mg Oral Daily   multivitamin with minerals  1 tablet Oral Daily   QUEtiapine  12.5 mg Oral QHS   rosuvastatin  20 mg Oral Daily   scopolamine  1 patch Transdermal Q72H   [START ON 01/01/2024] thiamine (VITAMIN B1) injection  100 mg Intravenous Daily   Continuous Infusions:  thiamine (VITAMIN B1) injection Stopped (12/25/23 1327)   Followed by   NOREEN ON 12/26/2023] thiamine (VITAMIN B1) injection      LOS: 2 days   Ray Marker, DO [facility] Available via Epic secure chat 7am-7pm After these hours, please refer to coverage provider listed on [email or web address] 12/25/2023, 5:59 PM

## 2023-12-25 NOTE — Progress Notes (Signed)
 OT Cancellation Note  Patient Details Name: Ray Brown MRN: [id number] DOB: [birth year]   Cancelled Treatment:    Reason Eval/Treat Not Completed: Patient at procedure or test/ unavailable.  Patient unavailable due to being out for procedure.   [name], MS, OTR/L  12/25/2023, 1:27 PM

## 2023-12-25 NOTE — TOC Initial Note (Signed)
 Transition of Care [facility]) - Initial/Assessment Note    Patient Details  Name: Ray Brown MRN: [id number] Date of Birth: [birth year]  Transition of Care [facility]) CM/SW Contact:    Bascom Service, RN Phone Number: 12/25/2023, 12:54 PM  Clinical Narrative: d/c plan home. Psych following-meds adjusted. Has own transport home.                  Expected Discharge Plan: Home/Self Care Barriers to Discharge: Continued Medical Work up   Patient Goals and CMS Choice Patient states their goals for this hospitalization and ongoing recovery are:: Home CMS Medicare.gov Compare Post Acute Care list provided to:: Patient Represenative (must comment) (Edith(spouse)) Choice offered to / list presented to : Spouse [place] ownership interest in [facility].provided to:: Spouse    Expected Discharge Plan and Services                                              Prior Living Arrangements/Services                       Activities of Daily Living   ADL Screening (condition at time of admission) Independently performs ADLs?: Yes (appropriate for developmental age) Is the patient deaf or have difficulty hearing?: No Does the patient have difficulty seeing, even when wearing glasses/contacts?: No Does the patient have difficulty concentrating, remembering, or making decisions?: No  Permission Sought/Granted                  Emotional Assessment              Admission diagnosis:  Intractable nausea and vomiting [R11.2] Sepsis with acute renal failure without septic shock, due to unspecified organism, unspecified acute renal failure type (HCC) [A41.9, R65.20, N17.9] Nausea and vomiting, unspecified vomiting type [R11.2] Patient Active Problem List   Diagnosis Date Noted   Hallucinations 12/24/2023   Intractable nausea and vomiting 12/22/2023   Gastroesophageal reflux disease without esophagitis 12/22/2023   Diverticulitis 12/22/2023   Constipation  12/22/2023   Barrett's esophagus 12/22/2023   Hardening of the aorta (main artery of the heart) 12/22/2023   History of colonic polyps 12/22/2023   Hypertensive heart disease without congestive heart failure 12/22/2023   Peripheral angiopathy due to type 2 diabetes mellitus (HCC) 12/22/2023   Spinal stenosis 12/22/2023   Essential hypertension 08/23/2022   Pure hypercholesterolemia 08/23/2022   Diverticulitis with abscess s/p robotic LAR rectosigmoid resection 01/16/2019 01/16/2019   Hypomagnesemia 12/29/2018   Protein-calorie malnutrition, moderate 12/29/2018   Chronic anticoagulation 12/28/2018   Coronary stent 2008  restenosis due to progression of disease s/p restent 2010 12/28/2018   Status post aortobifemoral bypass surgery 2013 12/28/2018   Left-sided low back pain with left-sided sciatica 12/28/2018   Hypokalemia 12/28/2018   Chronic back pain    BPPV (benign paroxysmal positional vertigo) 11/10/2018   Abscess of sigmoid colon due to diverticulitis 11/03/2018   Dyslipidemia 11/03/2018   OSA (obstructive sleep apnea) 11/03/2018   Pain of right shoulder joint on movement 06/20/2018   Dyspnea 12/05/2014   Dizziness 12/05/2014   Diabetes mellitus with peripheral artery disease (HCC) 10/04/2013   Coronary artery disease involving native coronary artery of native heart without angina pectoris 10/04/2013   Impingement syndrome of right shoulder 03/28/2013   Obesity 01/21/2013   Peripheral vascular disease    PCP:  Loreli Kins, MD Pharmacy:   [name], [place] - [address] AT [facility] [address] [place] [address] Phone: [phone] Fax: [phone]  [facility]) - [place], [place] - [address] [facility] [address] [place] [address] [place] [address] Phone: [phone] Fax: [phone]  [facility] - [place], [place] - [address] [place] [address] Phone: [phone] Fax: [phone]     Social Drivers of Health (SDOH) Social History: SDOH Screenings   Food Insecurity: No Food Insecurity (12/22/2023)  Housing: Low Risk  (12/22/2023)  Transportation Needs: No Transportation Needs (12/22/2023)  Utilities: Not At Risk (12/22/2023)  Social Connections: Socially Integrated (12/22/2023)  Tobacco Use: Medium Risk (12/22/2023)   SDOH Interventions:     Readmission Risk Interventions     No data to display

## 2023-12-25 NOTE — Plan of Care (Signed)

## 2023-12-25 NOTE — Evaluation (Signed)
 Physical Therapy Evaluation Patient Details Name: Ray Brown MRN: [id number] DOB: April 08, 1950 Today's Date: 12/25/2023  History of Present Illness  73 yo male admitted with intractable nausea and vomiting, intermittent reports of dizziness, emotional lability, hallucinations, paranoia. Hx of Barrett's esophagus, vertigo-BPPV-going to OPPT, CAD, PVD, OSA, spinal stenosis, chornic pain, obesity, DM.  Clinical Impression  On eval, pt required Min A for mobility. He ambulated ~150 feet with a RW. Pt presents with general weakness, decreased activity tolerance, and impaired gait/balance. He reported some dizziness at end of session after walk. He is at risk for falls when mobilizing. He exhibits some confusion intermittently. He can be impulsive and has poor safety awareness when mobilizing. Family was present during session. Will plan to follow pt during hospital stay. Recommend HHPT f/u unless pt prefers to continue going to OP PT for vestibular rehab.         If plan is discharge home, recommend the following: A little help with walking and/or transfers;A little help with bathing/dressing/bathroom;Assistance with cooking/housework;Assist for transportation;Help with stairs or ramp for entrance   Can travel by private vehicle        Equipment Recommendations None recommended by PT  Recommendations for Other Services       Functional Status Assessment Patient has had a recent decline in their functional status and demonstrates the ability to make significant improvements in function in a reasonable and predictable amount of time.     Precautions / Restrictions Precautions Precautions: Fall Restrictions Weight Bearing Restrictions Per Provider Order: No      Mobility  Bed Mobility Overal bed mobility: Needs Assistance Bed Mobility: Supine to Sit, Sit to Supine     Supine to sit: Contact guard, HOB elevated, Used rails Sit to supine: Contact guard assist, HOB elevated, Used  rails   General bed mobility comments: Cues for safety. LOB posteriorly when getting back into bed.    Transfers Overall transfer level: Needs assistance Equipment used: Rolling walker (2 wheels) Transfers: Sit to/from Stand Sit to Stand: Min assist           General transfer comment: Cues for safety, technique, hand placement. Impulsive and unsafe at time. Assist to stabilize.    Ambulation/Gait Ambulation/Gait assistance: Min assist Gait Distance (Feet): 150 Feet Assistive device: Rolling walker (2 wheels) Gait Pattern/deviations: Step-through pattern, Decreased stride length       General Gait Details: Assist to stabilize pt throughout distance. Unsteady with intermittent losses of balance. Cuse for safety, RW positioning, pacing. Pt reported mild dizziness once back in room-denied dizziness throughout walk in hallway.  Stairs            Wheelchair Mobility     Tilt Bed    Modified Rankin (Stroke Patients Only)       Balance Overall balance assessment: Needs assistance         Standing balance support: Bilateral upper extremity supported, During functional activity, Reliant on assistive device for balance Standing balance-Leahy Scale: Poor                               Pertinent Vitals/Pain Pain Assessment Pain Assessment: No/denies pain    Home Living Family/patient expects to be discharged to:: Private residence Living Arrangements: Spouse/significant other Available Help at Discharge: Family;Available 24 hours/day Type of Home: House Home Access: Stairs to enter Entrance Stairs-Rails: Doctor, General Practice of Steps: 6   Home Layout: One level Home Equipment: Rolling  Walker (2 wheels);Cane - single point      Prior Function Prior Level of Function : Independent/Modified Independent             Mobility Comments: using cane       Extremity/Trunk Assessment   Upper Extremity Assessment Upper Extremity  Assessment: Defer to OT evaluation    Lower Extremity Assessment Lower Extremity Assessment: Generalized weakness    Cervical / Trunk Assessment Cervical / Trunk Assessment: Normal  Communication   Communication Communication: No apparent difficulties    Cognition Arousal: Alert Behavior During Therapy: WFL for tasks assessed/performed   PT - Cognitive impairments: Safety/Judgement, Awareness, Problem solving                         Following commands: Impaired Following commands impaired: Only follows one step commands consistently     Cueing Cueing Techniques: Verbal cues, Tactile cues     General Comments      Exercises     Assessment/Plan    PT Assessment Patient needs continued PT services  PT Problem List Decreased strength;Decreased activity tolerance;Decreased balance;Decreased mobility;Decreased knowledge of use of DME       PT Treatment Interventions DME instruction;Gait training;Functional mobility training;Therapeutic activities;Therapeutic exercise;Patient/family education;Balance training    PT Goals (Current goals can be found in the Care Plan section)  Acute Rehab PT Goals Patient Stated Goal: none stated PT Goal Formulation: With patient/family Time For Goal Achievement: 01/08/24 Potential to Achieve Goals: Good    Frequency Min 3X/week     Co-evaluation               AM-PAC PT 6 Clicks Mobility  Outcome Measure Help needed turning from your back to your side while in a flat bed without using bedrails?: A Little Help needed moving from lying on your back to sitting on the side of a flat bed without using bedrails?: A Little Help needed moving to and from a bed to a chair (including a wheelchair)?: A Little Help needed standing up from a chair using your arms (e.g., wheelchair or bedside chair)?: A Little Help needed to walk in hospital room?: A Little Help needed climbing 3-5 steps with a railing? : A Lot 6 Click Score:  17    End of Session Equipment Utilized During Treatment: Gait belt Activity Tolerance: Patient tolerated treatment well Patient left: in bed;with bed alarm set;with family/visitor present   PT Visit Diagnosis: Difficulty in walking, not elsewhere classified (R26.2);Unsteadiness on feet (R26.81)    Time: 1212-1224 PT Time Calculation (min) (ACUTE ONLY): 12 min   Charges:   PT Evaluation $PT Eval Low Complexity: 1 Low   PT General Charges $$ ACUTE PT VISIT: 1 Visit            Dannial SQUIBB, PT Acute Rehabilitation  Office: [phone]

## 2023-12-26 ENCOUNTER — Inpatient Hospital Stay (HOSPITAL_COMMUNITY): Admit: 2023-12-26 | Discharge: 2023-12-26 | Disposition: A | Attending: Internal Medicine

## 2023-12-26 DIAGNOSIS — R569 Unspecified convulsions: Secondary | ICD-10-CM | POA: Diagnosis not present

## 2023-12-26 DIAGNOSIS — E785 Hyperlipidemia, unspecified: Secondary | ICD-10-CM | POA: Diagnosis not present

## 2023-12-26 DIAGNOSIS — R4182 Altered mental status, unspecified: Secondary | ICD-10-CM

## 2023-12-26 DIAGNOSIS — R112 Nausea with vomiting, unspecified: Secondary | ICD-10-CM | POA: Diagnosis not present

## 2023-12-26 DIAGNOSIS — I739 Peripheral vascular disease, unspecified: Secondary | ICD-10-CM | POA: Diagnosis not present

## 2023-12-26 DIAGNOSIS — K219 Gastro-esophageal reflux disease without esophagitis: Secondary | ICD-10-CM | POA: Diagnosis not present

## 2023-12-26 DIAGNOSIS — I10 Essential (primary) hypertension: Secondary | ICD-10-CM | POA: Diagnosis not present

## 2023-12-26 DIAGNOSIS — R443 Hallucinations, unspecified: Secondary | ICD-10-CM | POA: Diagnosis not present

## 2023-12-26 LAB — CBC WITH DIFFERENTIAL/PLATELET
Abs Immature Granulocytes: 0.02 K/uL (ref 0.00–0.07)
Basophils Absolute: 0 K/uL (ref 0.0–0.1)
Basophils Relative: 0 %
Eosinophils Absolute: 0.3 K/uL (ref 0.0–0.5)
Eosinophils Relative: 5 %
HCT: 46.2 % (ref 39.0–52.0)
Hemoglobin: 14.4 g/dL (ref 13.0–17.0)
Immature Granulocytes: 0 %
Lymphocytes Relative: 18 %
Lymphs Abs: 1.2 K/uL (ref 0.7–4.0)
MCH: 29.9 pg (ref 26.0–34.0)
MCHC: 31.2 g/dL (ref 30.0–36.0)
MCV: 95.9 fL (ref 80.0–100.0)
Monocytes Absolute: 0.5 K/uL (ref 0.1–1.0)
Monocytes Relative: 8 %
Neutro Abs: 4.3 K/uL (ref 1.7–7.7)
Neutrophils Relative %: 69 %
Platelets: 203 K/uL (ref 150–400)
RBC: 4.82 MIL/uL (ref 4.22–5.81)
RDW: 12.7 % (ref 11.5–15.5)
WBC: 6.4 K/uL (ref 4.0–10.5)
nRBC: 0 % (ref 0.0–0.2)

## 2023-12-26 LAB — COMPREHENSIVE METABOLIC PANEL WITH GFR
ALT: 8 U/L (ref 0–44)
AST: 24 U/L (ref 15–41)
Albumin: 3.3 g/dL — ABNORMAL LOW (ref 3.5–5.0)
Alkaline Phosphatase: 56 U/L (ref 38–126)
Anion gap: 8 (ref 5–15)
BUN: 8 mg/dL (ref 8–23)
CO2: 26 mmol/L (ref 22–32)
Calcium: 9.1 mg/dL (ref 8.9–10.3)
Chloride: 108 mmol/L (ref 98–111)
Creatinine, Ser: 0.57 mg/dL — ABNORMAL LOW (ref 0.61–1.24)
GFR, Estimated: >60 mL/min (ref >60)
Glucose, Bld: 121 mg/dL — ABNORMAL HIGH (ref 70–99)
Potassium: 3.6 mmol/L (ref 3.5–5.1)
Sodium: 142 mmol/L (ref 135–145)
Total Bilirubin: 0.5 mg/dL (ref 0.0–1.2)
Total Protein: 5.5 g/dL — ABNORMAL LOW (ref 6.5–8.1)

## 2023-12-26 LAB — MAGNESIUM: Magnesium: 1.9 mg/dL (ref 1.7–2.4)

## 2023-12-26 LAB — GLUCOSE, CAPILLARY
Glucose-Capillary: 134 mg/dL — ABNORMAL HIGH (ref 70–99)
Glucose-Capillary: 95 mg/dL (ref 70–99)
Glucose-Capillary: 174 mg/dL — ABNORMAL HIGH (ref 70–99)
Glucose-Capillary: 123 mg/dL — ABNORMAL HIGH (ref 70–99)

## 2023-12-26 LAB — PHOSPHORUS: Phosphorus: 2.5 mg/dL (ref 2.5–4.6)

## 2023-12-26 MED ORDER — ORAL CARE MOUTH RINSE
15.0000 mL | OROMUCOSAL | Status: DC | PRN
Start: 2023-12-26 — End: 2023-12-28

## 2023-12-26 MED ORDER — QUETIAPINE FUMARATE 25 MG PO TABS
25.0000 mg | ORAL_TABLET | Freq: Once | ORAL | Status: AC
  Administered 2023-12-26: 25 mg via ORAL
  Filled 2023-12-26: qty 1

## 2023-12-26 MED ORDER — QUETIAPINE FUMARATE 25 MG PO TABS
50.0000 mg | ORAL_TABLET | Freq: Every day | ORAL | Status: DC
  Administered 2023-12-26 – 2023-12-27 (×2): 50 mg via ORAL
  Filled 2023-12-26 (×2): qty 2

## 2023-12-26 MED ORDER — CLOPIDOGREL BISULFATE 75 MG PO TABS
75.0000 mg | ORAL_TABLET | Freq: Every day | ORAL | Status: DC
  Administered 2023-12-26 – 2023-12-27 (×2): 75 mg via ORAL
  Filled 2023-12-26 (×2): qty 1

## 2023-12-26 MED ORDER — POTASSIUM CHLORIDE CRYS ER 20 MEQ PO TBCR
40.0000 meq | EXTENDED_RELEASE_TABLET | Freq: Once | ORAL | Status: AC
  Administered 2023-12-26: 40 meq via ORAL
  Filled 2023-12-26: qty 2

## 2023-12-26 NOTE — TOC Progression Note (Signed)
 Transition of Care [facility]) - Progression Note    Patient Details  Name: Ray Brown MRN: [id number] Date of Birth: September 04, 1950  Transition of Care [facility]) CM/SW Contact  Haliyah Fryman, Nathanel, RN Phone Number: 12/26/2023, 11:24 AM  Clinical Narrative:  d/c plan home w/HHC-no preference await HHC agency to accept for HHPT. Has own transport home.     Expected Discharge Plan: Home/Self Care Barriers to Discharge: Continued Medical Work up               Expected Discharge Plan and Services   Discharge Planning Services: CM Consult Post Acute Care Choice: Home Health Living arrangements for the past 2 months: Single Family Home                                       Social Drivers of Health (SDOH) Interventions SDOH Screenings   Food Insecurity: No Food Insecurity (12/22/2023)  Housing: Low Risk  (12/22/2023)  Transportation Needs: No Transportation Needs (12/22/2023)  Utilities: Not At Risk (12/22/2023)  Social Connections: Socially Integrated (12/22/2023)  Tobacco Use: Medium Risk (12/22/2023)    Readmission Risk Interventions     No data to display

## 2023-12-26 NOTE — Final Progress Note (Signed)
 Subjective: Nausea/vomiting improving.  Objective: Vital signs in last 24 hours: Temp:  [97.6 F (36.4 C)-98 F (36.7 C)] 98 F (36.7 C) (11/04 0605) Pulse Rate:  [65-73] 65 (11/04 0906) Resp:  [18] 18 (11/04 0605) BP: (138-148)/(75-78) 148/78 (11/04 0906) SpO2:  [94 %-97 %] 94 % (11/04 0605) Weight change:  Last BM Date : 12/25/23  PE: GEN:  NAD HEENT:  No icterus NEURO:  No encephalopathy  Lab Results: CBC    Component Value Date/Time   WBC 6.4 12/26/2023 0521   RBC 4.82 12/26/2023 0521   HGB 14.4 12/26/2023 0521   HCT 46.2 12/26/2023 0521   PLT 203 12/26/2023 0521   MCV 95.9 12/26/2023 0521   MCH 29.9 12/26/2023 0521   MCHC 31.2 12/26/2023 0521   RDW 12.7 12/26/2023 0521   LYMPHSABS 1.2 12/26/2023 0521   MONOABS 0.5 12/26/2023 0521   EOSABS 0.3 12/26/2023 0521   BASOSABS 0.0 12/26/2023 0521  CMP     Component Value Date/Time   NA 142 12/26/2023 0521   K 3.6 12/26/2023 0521   CL 108 12/26/2023 0521   CO2 26 12/26/2023 0521   GLUCOSE 121 (H) 12/26/2023 0521   BUN 8 12/26/2023 0521   CREATININE 0.57 (L) 12/26/2023 0521   CALCIUM  9.1 12/26/2023 0521   PROT 5.5 (L) 12/26/2023 0521   ALBUMIN 3.3 (L) 12/26/2023 0521   AST 24 12/26/2023 0521   ALT 8 12/26/2023 0521   ALKPHOS 56 12/26/2023 0521   BILITOT 0.5 12/26/2023 0521   EGFR 79.0 12/26/2022 1328   GFRNONAA >60 12/26/2023 0521   Assessment:   Nausea, vomiting, weight loss.  Extensive unrevealing testing.  Could be inner ear/neurologic process versus other.  Doubt possible low EF gallbladder is clinically relevant.  Plan:   Pantoprazole  40 mg po every day upon discharge. Ondansetron  4 mg SL q 6 hrs prn upon discharge. Outpatient ENT evaluation possible inner ear process causing nausea/vomiting. Outpatient surgical consultation to gauge prons/cons cholecystectomy. Follow-up [facility] will be arranged. [facility] will sign-off; please call with questions; thank you for the  consultation.   BURNETTE ELSIE HERO 12/26/2023, 11:47 AM   Cell [phone] If no answer or after 5 PM call [phone]

## 2023-12-26 NOTE — Procedures (Signed)
 Patient Name: Ray Brown  MRN: [id number]  Epilepsy Attending: Arlin MALVA Krebs  Referring Physician/Provider: [name], DO  Date: 12/26/2023 Duration: 23.53 mins  Patient history: 73yo m with ams. EEG to evaluate for seizure  Level of alertness: Awake  AEDs during EEG study: None  Technical aspects: This EEG study was done with scalp electrodes positioned according to the 10-20 International system of electrode placement. Electrical activity was reviewed with band pass filter of 1-70Hz , sensitivity of 7 uV/mm, display speed of 66mm/sec with a 60Hz  notched filter applied as appropriate. EEG data were recorded continuously and digitally stored.  Video monitoring was available and reviewed as appropriate.  Description: The posterior dominant rhythm consists of 9-10 Hz activity of moderate voltage (25-35 uV) seen predominantly in posterior head regions, symmetric and reactive to eye opening and eye closing. There is an excessive amount of 15 to 18 Hz beta activity distributed symmetrically and diffusely. Hyperventilation and photic stimulation were not performed.     ABNORMALITY - Excessive beta, generalized  IMPRESSION: This study is within normal limits. The excessive beta activity seen in the background is most likely due to the effect of benzodiazepine and is a benign EEG pattern. No seizures or epileptiform discharges were seen throughout the recording.  A normal interictal EEG does not exclude the diagnosis of epilepsy.   Keiera Strathman O Caio Devera

## 2023-12-26 NOTE — Plan of Care (Signed)
  Problem: Education: Goal: Knowledge of General Education information will improve Description: Including pain rating scale, medication(s)/side effects and non-pharmacologic comfort measures Outcome: Progressing   Problem: Health Behavior/Discharge Planning: Goal: Ability to manage health-related needs will improve Outcome: Progressing   Problem: Clinical Measurements: Goal: Ability to maintain clinical measurements within normal limits will improve Outcome: Progressing Goal: Will remain free from infection Outcome: Progressing Goal: Diagnostic test results will improve Outcome: Progressing Goal: Respiratory complications will improve Outcome: Progressing Goal: Cardiovascular complication will be avoided Outcome: Progressing   Problem: Activity: Goal: Risk for activity intolerance will decrease Outcome: Progressing   Problem: Nutrition: Goal: Adequate nutrition will be maintained Outcome: Progressing   Problem: Coping: Goal: Level of anxiety will decrease Outcome: Progressing   Problem: Elimination: Goal: Will not experience complications related to bowel motility Outcome: Progressing Goal: Will not experience complications related to urinary retention Outcome: Progressing   Problem: Pain Managment: Goal: General experience of comfort will improve and/or be controlled Outcome: Progressing   Problem: Skin Integrity: Goal: Risk for impaired skin integrity will decrease Outcome: Progressing   Problem: Fluid Volume: Goal: Hemodynamic stability will improve Outcome: Progressing   Problem: Clinical Measurements: Goal: Diagnostic test results will improve Outcome: Progressing Goal: Signs and symptoms of infection will decrease Outcome: Progressing   Problem: Respiratory: Goal: Ability to maintain adequate ventilation will improve Outcome: Progressing   Problem: Safety: Goal: Ability to remain free from injury will improve Outcome: Not Progressing  Pt is not  steady on his feet and will jump up out the bed with walker.

## 2023-12-26 NOTE — TOC Transition Note (Signed)
 Transition of Care [facility]) - Discharge Note   Patient Details  Name: Ray Brown MRN: [id number] Date of Birth: [birth year]  Transition [facility] Care [facility]) CM/SW Contact:  Bascom Service, RN Phone Number: 12/26/2023, 11:43 AM   Clinical Narrative: d/c home w/HHPT-[facility] chosen. No further CM needs.      Final next level of care: Home w Home Health Services Barriers to Discharge: No Barriers Identified   Patient Goals and CMS Choice Patient states their goals for this hospitalization and ongoing recovery are:: Home CMS Medicare.gov Compare Post Acute Care list provided to:: Patient Represenative (must comment) (Edith(spouse)) Choice offered to / list presented to : Patient, Spouse [place] ownership interest in [facility].provided to:: Spouse    Discharge Placement                       Discharge Plan and Services Additional resources added to the After Visit Summary for     Discharge Planning Services: CM Consult Post Acute Care Choice: Home Health                    HH Arranged: PT [facility] Agency: [facility] Date [facility] Agency Contacted: 12/26/23 Time HH Agency Contacted: 1143 Representative spoke with at [facility] Agency: Amy  Social Drivers of Health (SDOH) Interventions SDOH Screenings   Food Insecurity: No Food Insecurity (12/22/2023)  Housing: Low Risk  (12/22/2023)  Transportation Needs: No Transportation Needs (12/22/2023)  Utilities: Not At Risk (12/22/2023)  Social Connections: Socially Integrated (12/22/2023)  Tobacco Use: Medium Risk (12/22/2023)     Readmission Risk Interventions     No data to display

## 2023-12-26 NOTE — Progress Notes (Addendum)
 PROGRESS NOTE    Ray Brown  FMW:[id number] DOB: 11/13/1950 DOA: 12/22/2023 PCP: Loreli Kins, MD   Brief Narrative:  Ray Brown is a 73 y.o. male with medical history significant of osteoarthritis, spinal stenosis, bronchitis, chronic back pain, CAD, history of unstable angina episode, hypertension, history of hypertensive heart disease, hyperlipidemia, aortic atherosclerosis, peripheral vascular disease, history of pneumonia, sleep apnea on CPAP, moderate protein malnutrition, history of unspecified class obesity, type 2 diabetes, GERD, hiatal hernia, Barrett's esophagus, diverticulosis/diverticulitis, constipation,, colon polyp, history of BPH who presented emergency department complaints of nausea and numerous episodes of emesis since Thursday of last week.  His nausea vomiting has since improved and resolved  Gastroenterology was consulted and he underwent CT of the head without contrast and as well as CTA of the chest and abdomen/pelvis as below. Was still having some Nausea but diet being advanced. GI obtaining HIDA Scan showed possible low EF and GI doubts that this is clinically relevant.    Patient started complaining of persistent dizziness and workup as below.  Now having sporadic paranoia and visual hallucinations so discussed the case with neurology who recommended getting further studies as below.  Psychiatric consult obtained and being initiated on Seroquel and now dose is being adjusted.  Assessment and Plan:  Intractable Nausea and Vomiting Superimposed on  Gastroesophageal reflux disease without esophagitis Associated with Barrett's esophagus: Improving.  IV fluid hydration is stopped. CT Abd/Pelvis showed No acute findings in the abdomen or pelvis and showed resolution of prior inflammatory changes around the pancreatic tail. Notably there was also moderate prostatomegaly, Scattered sigmoid diverticulosis without evidence of diverticulitis, and a patent aortic  graft. GI Consulted and recommended checking a HIDA Scan w/ EF. HIDA  showed that the patient gallbladder ejection fraction could not be adequately identified but was presumably found to be low due to motion artifact and patient's common bile duct and cystic duct were patent. GI doubts that presumed low EF this is clinically relevant and has now been placed on back on Soft Diet.  His nausea vomiting has improved significantly today. Repeat lab work in the a.m.  Dizziness: Added Meclizine 25 mg po Daily PRN but changed to TIDprn and Scopolamine Patch. Head CT done w/o Contrast and showed No acute Intracranial Abnormality; MRI of Brain and showed No acute intracranial abnormality.  GI believes his nausea vomiting and dizziness is related to inner ear disease and recommending ENT evaluation outpatient. TSH was 2.640. Checking EEG and pending to be read. Adding High Dose Thiamine and is s/p 500 mg q8h x 2 days and is now on 250 mg x 2 Days and then 100 mg Daily. CTA Head and Neck showed Atherosclerosis with Moderate distal right ICA stenosis (RICA siphon) with no other significant stenosis but also showed Emphysema, Small layering pleural effusions and Stable CT appearance of the brain. -PT evaluated and recommending HHPT w/ OP PT for Vestibular Rehab; No symptoms of dizziness when turning head today but feels like he has dizziness upon getting up -Check Orthostatic VS and will add TED hose  Hallucinations and Sporadic Paranoia and Emotional Lability with Prosopometaorphosia: MRI done and Negative. D/w Neurology Dr. Voncile who recommended EEG, CTA Head and Neck and High Dose Thiamine Protocol (see as above). -Consulted Psychiatry as patient having visual hallucinations and distorted faces and demonic figures. Ammonia Level was 41 and mildly elevated and repeat was 35. -Avoid Polypharmacy and C/w Delirium Precautions -Psychiatry started the patient on Quetiapine 25 mg po at bedtime  and are increasing to 50 mg at  bedtime today given his continued hallucinations   Hypokalemia: K+ is now 3.6. Replete w/ po KCL 40 mEQ x 1. CTM and Replete as Necessary. Repeat CMP in the AM   Hypomagnesemia: Mag Level is now 1.9. Replete w/ IV Mag Sulfate 4 grams yesterday. CTM and Replete as Necessary. Repeat Mag Level in the AM  Hypophosphatemia: Phos Level is now 2.5. Replete w/ IV K Phos  30 mmol yesterday. CTM and Replete as Necessary. Repeat Phos Level in the AM   Essential Hypertension: Has had significant weight loss. Currently not on antihypertensives except Metoprolol  Succinate 25 mg po Daily. CTM BP per Protocol. Last BP reading was 139/74   PVD: Initially held DAPT but ASA was resumed.. GI unlikely to repeat endoscopic studies needed given recent EGD and Colonoscopy in June. GI recommending obtaining HIDA Scan and results as above. HIDA done and as above. Will resume Clopidogrel  11/4   Dyslipidemia: Resumed Fenofibrate  160 mg po at bedtime and Rosuvastatin 20 mg po Daily    OSA (obstructive sleep apnea): Currently not using CPAP   Hypoalbuminemia: Patient's Albumin Lvl is now 3.3. CTM and Replete as Necessary. Repeat CMP in the AM:  Weight Loss: Nutritionist Consulted and recommending Ensure Plus high-protein p.o. 3 times daily and multivitamin with minerals daily. CT Abd/Pelvis as above. CTA Chest PE Protocol showed No evidence of pulmonary embolism, Coronary and aortic plaque, and Pulmonary emphysema; will need further evaluation in outpatient setting   DVT prophylaxis: Place TED hose Start: 12/26/23 1546 Place TED hose Start: 12/26/23 1014 SCDs Start: 12/22/23 1554    Code Status: Full Code Family Communication: D/w Wife @ bedside   Disposition Plan:  Level of care: Progressive Status is: Inpatient Remains inpatient appropriate because: His further clinical improvement and clearance by the specialist as Psychiatry is adjusting his medications and will need to check his orthostatics    Consultants:   GI Psychiatry D/w Neurology   Procedures:  As delineated as above  Antimicrobials:  Anti-infectives (From admission, onward)    Start     Dose/Rate Route Frequency Ordered Stop   12/23/23 0200  vancomycin (VANCOREADY) IVPB 1750 mg/350 mL  Status:  Discontinued        1,750 mg 175 mL/hr over 120 Minutes Intravenous Every 24 hours 12/22/23 1607 12/23/23 1841   12/22/23 1700  ceFEPIme  (MAXIPIME ) 2 g in sodium chloride  0.9 % 100 mL IVPB  Status:  Discontinued        2 g 200 mL/hr over 30 Minutes Intravenous Every 8 hours 12/22/23 1607 12/23/23 1841   12/22/23 1600  metroNIDAZOLE  (FLAGYL ) IVPB 500 mg  Status:  Discontinued        500 mg 100 mL/hr over 60 Minutes Intravenous 2 times daily 12/22/23 1553 12/23/23 1841   12/22/23 0400  ceFEPIme  (MAXIPIME ) 2 g in sodium chloride  0.9 % 100 mL IVPB        2 g 200 mL/hr over 30 Minutes Intravenous  Once 12/22/23 0346 12/22/23 0500   12/22/23 0400  metroNIDAZOLE  (FLAGYL ) IVPB 500 mg        500 mg 100 mL/hr over 60 Minutes Intravenous  Once 12/22/23 0346 12/22/23 0616   12/22/23 0400  vancomycin (VANCOCIN) IVPB 1000 mg/200 mL premix        1,000 mg 200 mL/hr over 60 Minutes Intravenous  Once 12/22/23 0346 12/22/23 0713       Subjective: And examined at bedside and thinks his nausea vomiting is  improved significantly.  Still feels dizzy but not as bad and thinks it is only when he stands up now or gets up.  Still hallucinating quite a bit and wife states that he had a very bad night hallucinating and seeing things.  Psychiatry is adjusting his medications further.  EEG to be done today.  No other concerns or complaints at this time.  Objective: Vitals:   12/25/23 2021 12/26/23 0605 12/26/23 0906 12/26/23 1345  BP: 138/75 (!) 147/75 (!) 148/78 139/74  Pulse: 73 66 65 68  Resp: 18 18    Temp: 97.6 F (36.4 C) 98 F (36.7 C)  97.8 F (36.6 C)  TempSrc: Oral Oral  Oral  SpO2: 97% 94%  97%  Weight:      Height:        Intake/Output  Summary (Last 24 hours) at 12/26/2023 1558 Last data filed at 12/26/2023 0800 Gross per 24 hour  Intake 120 ml  Output --  Net 120 ml   Filed Weights   12/22/23 1251  Weight: 71 kg   Examination: Physical Exam:  Constitutional: Thin elderly Caucasian male in no acute distress appears calm Respiratory: Diminished to auscultation bilaterally, no wheezing, rales, rhonchi or crackles. Normal respiratory effort and patient is not tachypenic. No accessory muscle use.  Unlabored breathing Cardiovascular: RRR, no murmurs / rubs / gallops. S1 and S2 auscultated. No extremity edema. Abdomen: Soft, non-tender, non-distended. Bowel sounds positive.  GU: Deferred. Musculoskeletal: No clubbing / cyanosis of digits/nails. No joint deformity upper and lower extremities.  Skin: No rashes, lesions, ulcers on limited skin evaluation. No induration; Warm and dry.  Neurologic: CN 2-12 grossly intact with no focal deficits. Romberg sign and cerebellar reflexes not assessed.  Psychiatric: Normal judgment and insight. Alert and oriented x 3 this a.m. Normal mood and appropriate affect.   Data Reviewed: I have personally reviewed following labs and imaging studies  CBC: Recent Labs  Lab 12/22/23 0358 12/23/23 0540 12/24/23 0843 12/25/23 0722 12/26/23 0521  WBC 12.2* 10.9* 6.4 6.3 6.4  NEUTROABS 11.2* 8.6* 4.5 4.3 4.3  HGB 16.5 13.4 13.9 14.0 14.4  HCT 50.4 41.9 44.1 43.3 46.2  MCV 93.5 96.1 95.5 94.7 95.9  PLT 240 198 181 190 203   Basic Metabolic Panel: Recent Labs  Lab 12/22/23 0900 12/23/23 0540 12/24/23 0530 12/25/23 0722 12/26/23 0521  NA 143 138 139 142 142  K 3.3* 4.3 3.4* 3.6 3.6  CL 102 103 105 108 108  CO2 21* 23 26 27 26   GLUCOSE 132* 93 115* 126* 121*  BUN 11 15 12  7* 8  CREATININE 0.83 0.88 0.64 0.54* 0.57*  CALCIUM  9.2 8.6* 8.5* 8.9 9.1  MG <0.5* 1.4* 1.9 1.5* 1.9  PHOS 2.8  --  1.8* 1.9* 2.5   GFR: Estimated Creatinine Clearance: 78.4 mL/min (A) (by C-G formula based  on SCr of 0.57 mg/dL (L)). Liver Function Tests: Recent Labs  Lab 12/22/23 0358 12/23/23 0540 12/24/23 0530 12/25/23 0722 12/26/23 0521  AST 21 25 28 25 24   ALT 10 7 8 8 8   ALKPHOS 58 44 45 51 56  BILITOT 0.7 0.9 0.5 0.5 0.5  PROT 6.7 5.3* 5.0* 5.2* 5.5*  ALBUMIN 4.2 3.3* 3.1* 3.2* 3.3*   Recent Labs  Lab 12/22/23 0900  LIPASE 11   Recent Labs  Lab 12/24/23 0530 12/25/23 0722  AMMONIA 41* 35   Coagulation Profile: Recent Labs  Lab 12/22/23 0347  INR 1.1   Cardiac Enzymes: No results for  input(s): CKTOTAL, CKMB, CKMBINDEX, TROPONINI in the last 168 hours. BNP (last 3 results) No results for input(s): PROBNP in the last 8760 hours. HbA1C: No results for input(s): HGBA1C in the last 72 hours. CBG: Recent Labs  Lab 12/25/23 1144 12/25/23 1630 12/25/23 2019 12/26/23 0728 12/26/23 1151  GLUCAP 113* 147* 148* 134* 95   Lipid Profile: No results for input(s): CHOL, HDL, LDLCALC, TRIG, CHOLHDL, LDLDIRECT in the last 72 hours. Thyroid Function Tests: Recent Labs    12/24/23 0530  TSH 2.640   Anemia Panel: Recent Labs    12/24/23 0530  VITAMINB12 1,276*  FOLATE >20.0   Sepsis Labs: Recent Labs  Lab 12/22/23 0404 12/22/23 0622 12/22/23 0900 12/22/23 0931 12/22/23 1254 12/23/23 0540  PROCALCITON  --   --  <0.10  --   --  <0.10  LATICACIDVEN 1.4 2.7*  --  1.6 1.4  --    Recent Results (from the past 240 hours)  Resp panel by RT-PCR (RSV, Flu A&B, Covid)     Status: None   Collection Time: 12/22/23  3:47 AM   Specimen: Nasal Swab  Result Value Ref Range Status   SARS Coronavirus 2 by RT PCR NEGATIVE NEGATIVE Final    Comment: (NOTE) SARS-CoV-2 target nucleic acids are NOT DETECTED.  The SARS-CoV-2 RNA is generally detectable in upper respiratory specimens during the acute phase of infection. The lowest concentration of SARS-CoV-2 viral copies this assay can detect is 138 copies/mL. A negative result does not preclude  SARS-Cov-2 infection and should not be used as the sole basis for treatment or other patient management decisions. A negative result may occur with  improper specimen collection/handling, submission of specimen other than nasopharyngeal swab, presence of viral mutation(s) within the areas targeted by this assay, and inadequate number of viral copies(<138 copies/mL). A negative result must be combined with clinical observations, patient history, and epidemiological information. The expected result is Negative.  Fact Sheet for Patients:  bloggercourse.com  Fact Sheet for Healthcare Providers:  seriousbroker.it  This test is no t yet approved or cleared by the [place]  FDA and  has been authorized for detection and/or diagnosis of SARS-CoV-2 by FDA under an Emergency Use Authorization (EUA). This EUA will remain  in effect (meaning this test can be used) for the duration of the COVID-19 declaration under Section 564(b)(1) of the Act, 21 U.S.C.section 360bbb-3(b)(1), unless the authorization is terminated  or revoked sooner.       Influenza A by PCR NEGATIVE NEGATIVE Final   Influenza B by PCR NEGATIVE NEGATIVE Final    Comment: (NOTE) The Xpert Xpress SARS-CoV-2/FLU/RSV plus assay is intended as an aid in the diagnosis of influenza from Nasopharyngeal swab specimens and should not be used as a sole basis for treatment. Nasal washings and aspirates are unacceptable for Xpert Xpress SARS-CoV-2/FLU/RSV testing.  Fact Sheet for Patients: bloggercourse.com  Fact Sheet for Healthcare Providers: seriousbroker.it  This test is not yet approved or cleared by the [place]  FDA and has been authorized for detection and/or diagnosis of SARS-CoV-2 by FDA under an Emergency Use Authorization (EUA). This EUA will remain in effect (meaning this test can be used) for the duration of  the COVID-19 declaration under Section 564(b)(1) of the Act, 21 U.S.C. section 360bbb-3(b)(1), unless the authorization is terminated or revoked.     Resp Syncytial Virus by PCR NEGATIVE NEGATIVE Final    Comment: (NOTE) Fact Sheet for Patients: bloggercourse.com  Fact Sheet for Healthcare Providers: seriousbroker.it  This test  is not yet approved or cleared by the [place]  FDA and has been authorized for detection and/or diagnosis of SARS-CoV-2 by FDA under an Emergency Use Authorization (EUA). This EUA will remain in effect (meaning this test can be used) for the duration of the COVID-19 declaration under Section 564(b)(1) of the Act, 21 U.S.C. section 360bbb-3(b)(1), unless the authorization is terminated or revoked.  Performed at [facility], [address], [place], [place] [address]   Blood culture (routine x 2)     Status: None (Preliminary result)   Collection Time: 12/22/23  3:58 AM   Specimen: BLOOD  Result Value Ref Range Status   Specimen Description   Final    BLOOD LEFT ANTECUBITAL Performed at [facility], [address], [place], [place] [address]    Special Requests   Final    Blood Culture results may not be optimal due to an inadequate volume of blood received in culture bottles BOTTLES DRAWN AEROBIC AND ANAEROBIC Performed at [facility], [address] [place], [place] [address]    Culture   Final    NO GROWTH 4 DAYS Performed at [facility], [address], [place], [place] [address]    Report Status PENDING  Incomplete  Blood culture (routine x 2)     Status: None (Preliminary result)   Collection Time: 12/22/23  4:26 AM   Specimen: BLOOD  Result Value Ref Range Status   Specimen Description   Final    BLOOD RIGHT ANTECUBITAL Performed at [facility], [address] [place], [place] [address]    Special  Requests   Final    Blood Culture results may not be optimal due to an inadequate volume of blood received in culture bottles BOTTLES DRAWN AEROBIC AND ANAEROBIC Performed at [facility], [address] [place], [place] [address]    Culture   Final    NO GROWTH 4 DAYS Performed at [facility], [address], [place], [place] [address]    Report Status PENDING  Incomplete    Radiology Studies: NM Hepato W/EF Result Date: 12/26/2023 EXAM: NM HEPATOBILLARY SCAN 12/25/2023 04:51:38 PM TECHNIQUE: RADIOPHARMACEUTICAL: 5.35 mCi Tc-12m mebrofenin Anterior planar images of the abdomen and pelvis were obtained over approximately 1 hour. mcg Cholecystokinin was administered intravenously over 60 minutes/Ensure plus or equivalent fatty meal was given by mouth. Dynamic imaging of the abdomen is obtained, and quantitative analysis is performed. Patient could not remain still during the exam. COMPARISON: None available. CLINICAL HISTORY: Abdominal pain, acute, nonlocalized; Nausea and vomiting. FINDINGS: Homogenous uptake within the liver. Normal clearance of the blood pool. Appropriate excretion into the biliary system. Gallbladder fills by 20 minutes. Tracer evident in the small bowel by 30 minutes. Gallbladder ejection fraction cannot be determined adequately quantified due to patient motion. Favor low ejection fraction. Patient reported symptoms during infusion: None. IMPRESSION: 1. Gallbladder ejection fraction cannot be adequately quantified due to patient motion; a low ejection fraction is favored. 2. Patent common bile duct and cystic duct Electronically signed by: Norleen Boxer MD 12/26/2023 08:33 AM EST RP Workstation: [id number]   Scheduled Meds:  aspirin  EC  81 mg Oral QHS   clopidogrel   75 mg Oral QHS   feeding supplement  237 mL Oral TID BM   fenofibrate   160 mg Oral QHS   metoprolol  succinate  25 mg Oral Daily   multivitamin with minerals  1 tablet Oral Daily  potassium chloride   40 mEq Oral Once   QUEtiapine  50 mg Oral QHS   rosuvastatin  20 mg Oral Daily   [START ON 01/01/2024] thiamine (VITAMIN B1) injection  100 mg Intravenous Daily   Continuous Infusions:  thiamine (VITAMIN B1) injection Stopped (12/26/23 1422)    LOS: 3 days   Ray Marker, DO [facility] Available via Epic secure chat 7am-7pm After these hours, please refer to coverage provider listed on [email or web address] 12/26/2023, 3:58 PM

## 2023-12-26 NOTE — Progress Notes (Signed)
 EEG complete - results pending

## 2023-12-26 NOTE — Consult Note (Addendum)
 [facility] Psychiatric Consult Follow Up  Patient Name: .Ray Brown  MRN: [id number]  DOB: August 12, 1950  Consult Order details:  Orders (From admission, onward)     Start     Ordered   12/24/23 1206  IP CONSULT TO PSYCHIATRY       Ordering Provider: [name], DO  Provider:  (Not yet assigned)  Question Answer Comment  Location [facility]   Reason for Consult? Paranoia, Visual Hallucinations      12/24/23 1205             Mode of Visit: In person    Psychiatry Consult Evaluation  Service Date: December 26, 2023 LOS:  LOS: 3 days  Chief Complaint Seeing things, especially at night.  Primary Psychiatric Diagnoses  Hallucinations r/t delirium  Assessment  Ray Brown is a 73 y.o. male admitted: Medicallyfor 12/22/2023  3:21 AM with medical history per Dr. Saintclair significant of osteoarthritis, spinal stenosis, bronchitis, chronic back pain, CAD, history of unstable angina episode, hypertension, history of hypertensive heart disease, hyperlipidemia, aortic atherosclerosis, peripheral vascular disease, history of pneumonia, sleep apnea on CPAP, moderate protein malnutrition, history of unspecified class obesity, type 2 diabetes, GERD, hiatal hernia, Barrett's esophagus, diverticulosis/diverticulitis, constipation, colon polyp, history of BPH who presents emergency department complaints of nausea and numerous episodes of emesis since Thursday of last week.  No psychiatric history  His current presentation of seeing things that started when he was in the ED with a current infection, none prior to this is most consistent with hallucinations under the context of delirium.  Current outpatient psychotropic medications include none. On initial examination, patient was assessed with his wife of 50 years and son at the bedside per his request.  He and the family reported the visual hallucinations started in the ED of seeing distorted faces, his son wearing  makeup when he wasn't, thinking he was standing on his bed, etc. Please see plan below for detailed recommendations.   Diagnoses:  Active Hospital problems: Principal Problem:   Intractable nausea and vomiting Active Problems:   Hallucinations   Peripheral vascular disease   Dyslipidemia   OSA (obstructive sleep apnea)   Hypokalemia   Hypomagnesemia   Essential hypertension   Gastroesophageal reflux disease without esophagitis   Barrett's esophagus    Plan   ## Psychiatric Medication Recommendations:  Increased Seroquel 50 mg at bedtime for hallucinations and sleep  ## Medical Decision Making Capacity: Not specifically addressed in this encounter  ## Further Work-up:  -- most recent EKG on 11/3 had QtC of 467, QtC 422 on 11/4 with normal sinus rhythm -- Pertinent labwork reviewed earlier this admission includes: CBC, chem panel, urine, EKG  ## Disposition:-- There are no psychiatric contraindications to discharge at this time  ## Behavioral / Environmental: -Delirium Precautions: Delirium Interventions for Nursing and Staff: - RN to open blinds every AM. - To Bedside: Glasses, hearing aide, and pt's own shoes. Make available to patients. when possible and encourage use. - Encourage po fluids when appropriate, keep fluids within reach. - OOB to chair with meals. - Passive ROM exercises to all extremities with AM & PM care. - RN to assess orientation to person, time and place QAM and PRN. - Recommend extended visitation hours with familiar family/friends as feasible. - Staff to minimize disturbances at night. Turn off television when pt asleep or when not in use.   ## Safety and Observation Level:  - Based on my clinical evaluation, I  estimate the patient to be at minimal risk of self harm in the current setting. - At this time, we recommend  routine. This decision is based on my review of the chart including patient's history and current presentation, interview of the patient,  mental status examination, and consideration of suicide risk including evaluating suicidal ideation, plan, intent, suicidal or self-harm behaviors, risk factors, and protective factors. This judgment is based on our ability to directly address suicide risk, implement suicide prevention strategies, and develop a safety plan while the patient is in the clinical setting. Please contact our team if there is a concern that risk level has changed.  CSSR Risk Category:C-SSRS RISK CATEGORY: No Risk  Suicide Risk Assessment: Patient has following modifiable risk factors for suicide: none Patient has following non-modifiable or demographic risk factors for suicide: male gender Patient has the following protective factors against suicide: Supportive family, no history of suicide attempts, and no history of NSSIB  Thank you for this consult request. Recommendations have been communicated to the primary team.  We will continue to follow at this time.   Sharlot Becker, NP       History of Present Illness  Relevant Aspects of [facility] Course:  Admitted on 12/22/2023 for N/V, consult for visual hallucinations and paranoia.   Patient Report:  The client was admitted for nausea and vomiting times one week, history of diverticulitis.  He was treated with antibiotics.  He and his family reported he started having visual hallucinations in the ED with seeing contorted faces, like his son wearing make up when he wasn't.  These continued with admission and increasing at night.  He is not sleeping well and his wife has been staying in his room in the recliner.  She said he was paranoid at night that people are outside of his room watching.  Last night, the client reported he thought he was standing on his bed and he wasn't.  These hallucinations are increasing his anxiety as he has never experienced these before.  Discussed his infection correlating with his symptoms with resolution most likely when the infection  resolves.  Recommendations made along with risks, including cardiovascular risks, of Seroquel or Risperdal or Zyprexa or Ativan .  Family and patient thought Seroquel would help best with his sleep and symptoms, will reassess tomorrow.    11/4: The client did not sleep much last night and his hallucinations are continuing.  Dr. Fredia reviewed the client and recommended a one time dose of Seroquel 25 mg to see if this is sleep related.  The dose was given with some clarity on the description of his hallucinations afterwards.  It's my eyes, not by brain.  It's weird. He complains of double vision and seeing things at times that are not there and when he looks elsewhere and back, the [facility] is gone.  At other times, he sees things in his room that appear to be other things.  For instance, he thought there was a large spider on the wall and it was the curled up tv cord.  He thought the black soap box with a smaller white box attached was the parking lot with a truck coming and told his wife to get out of the way.  His wife who is at his bedside feels his depth perception is off as he is picking at things or reaching at times for things that are not close to him.  At night, he does get paranoid and hallucinations are worse.  He has not slept much since admission which the wife confirmed.    The psychiatrist and this provider re-evaluated his medications and eliminated the anticholinergic medications (meclizine, compazine , etc.) to see if this may be a cause of his VH r/t the cholinergic effects of these medications.  His sclera are very reddened also.   The cholinergic effects, anxiety, new environment, and lack of sleep may be contributing to his VH and illusions.  Psych ROS:  Depression: some Anxiety:  high at times Mania (lifetime and current): none Psychosis: (lifetime and current): VH   Collateral information:  Contacted wife and son at bedside on 11/3 with patient's permission.  Information included in  note above.  Review of Systems  Constitutional:  Positive for malaise/fatigue.  HENT: Negative.    Eyes: Negative.   Respiratory: Negative.    Cardiovascular: Negative.   Gastrointestinal:  Positive for abdominal pain.  Genitourinary: Negative.   Musculoskeletal: Negative.   Skin: Negative.   Neurological: Negative.   Endo/Heme/Allergies: Negative.   Psychiatric/Behavioral:  Positive for hallucinations. The patient is nervous/anxious.      Psychiatric and Social History  Psychiatric History:  Information collected from patient, chart, and wife at his bedside.  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 Vital Signs:  Temp:  [97.6 F (36.4 C)-98 F (36.7 C)] 97.8 F (36.6 C) (11/04 1345) Pulse Rate:  [65-73] 68 (11/04 1345) Resp:  [18] 18 (11/04 0605) BP: (138-148)/(74-78) 139/74 (11/04 1345) SpO2:  [94 %-97 %] 97 % (11/04 1345) Blood pressure 139/74, pulse 68, temperature 97.8 F (36.6 C), temperature source Oral, resp. rate 18, height 5' 8 (1.727 m), weight 71 kg, SpO2 97%. Body mass index is 23.8 kg/m.  Physical Exam  Mental Status Exam: General Appearance: Casual  Orientation:  Full (Time, Place, and Person)  Memory:  Immediate;   Fair Recent;   Fair Remote;   Fair  Concentration:  Concentration: Fair and Attention Span: Fair  Recall:  Fair  Attention  Fair  Eye Contact:  Fair  Speech:  Normal Rate  Language:  Good  Volume:  Normal  Mood: anxious  Affect:  Blunt  Thought Process:  Coherent  Thought Content:  Hallucinations: Visual  Suicidal Thoughts:  No  Homicidal  Thoughts:  No  Judgement:  Fair  Insight:  Fair  Psychomotor Activity:  Decreased  Akathisia:  No  Fund of Knowledge:  Good      Assets:  Housing Leisure Time Resilience Social Support Transportation  Cognition:  WNL  ADL's:  assistance needed in hospital  AIMS (if indicated):        Other History   These have been pulled in through the EMR, reviewed, and updated if appropriate.  Family History:  The patient's family history includes Colon cancer in his mother; Coronary artery disease in his brother; Heart attack in his father; Thyroid cancer in his brother.  Medical History: Past Medical History:  Diagnosis Date   Arthritis    Bronchitis    Chronic back pain    Complication of anesthesia    difficulty breathing s/p back surgery at [facility] 2013  Coronary artery disease    Diabetes mellitus without complication (HCC)    Dry skin    in the back of head-left side   GERD (gastroesophageal reflux disease)    H/O hiatal hernia    Hypercholesteremia    Peripheral vascular disease    Pneumonia    Shortness of breath    Sleep apnea    has CPAP but does not use   Unstable angina (HCC) 12/05/2014    Surgical History: Past Surgical History:  Procedure Laterality Date   AORTA - BILATERAL FEMORAL ARTERY BYPASS GRAFT  11/27/2001   Dr Krystal Early.  Aortobifemoral bypass with a 14 x 8 Hemashield graft.   BACK SURGERY  2004   CARDIAC CATHETERIZATION  2010   x 2 stents RCA   CARDIAC CATHETERIZATION N/A 12/08/2014   Procedure: Left Heart Cath and Coronary Angiography;  Surgeon: Dorn JINNY Lesches, MD;  Location: [facility] INVASIVE CV LAB;  Service: Cardiovascular;  Laterality: N/A;   COLONOSCOPY  08/04/2009   CORONARY ANGIOPLASTY     2 stents   FOOT SURGERY  1980s   right foot   LYSIS OF ADHESION N/A 01/16/2019   Procedure: LYSIS OF ADHESIONS;  Surgeon: Sheldon Standing, MD;  Location: [facility] ORS;  Service: General;  Laterality: N/A;   PROCTOSCOPY N/A 01/16/2019   Procedure: RIGID PROCTOSCOPY;   Surgeon: Sheldon Standing, MD;  Location: [facility] ORS;  Service: General;  Laterality: N/A;   SHOULDER ARTHROSCOPY WITH SUBACROMIAL DECOMPRESSION Right 03/28/2013   Procedure: RIGHT SHOULDER ARTHROSCOPY WITH SUBACROMIAL DECOMPRESSION AND DISTAL CLAVICLE RESECTION;  Surgeon: Franky CHRISTELLA Pointer, MD;  Location: [facility] OR;  Service: Orthopedics;  Laterality: Right;   SHOULDER SURGERY     left shoulder rotator cuff repair   UPPER GI ENDOSCOPY     XI ROBOTIC ASSISTED LOWER ANTERIOR RESECTION N/A 01/16/2019   Procedure: XI ROBOTIC RESECTION OF SIGMOID COLON;  Surgeon: Sheldon Standing, MD;  Location: [facility] ORS;  Service: General;  Laterality: N/A;     Medications:   Current Facility-Administered Medications:    acetaminophen  (TYLENOL ) tablet 650 mg, 650 mg, Oral, Q6H PRN, 650 mg at 12/25/23 0947 **OR** acetaminophen  (TYLENOL ) suppository 650 mg, 650 mg, Rectal, Q6H PRN, Celinda Alm Lot, MD   aspirin  EC tablet 81 mg, 81 mg, Oral, QHS, Sheikh, Omair Latif, DO, 81 mg at 12/25/23 2129   feeding supplement (ENSURE PLUS HIGH PROTEIN) liquid 237 mL, 237 mL, Oral, TID BM, Sheikh, Omair Latif, DO, 237 mL at 12/26/23 1342   fenofibrate  tablet 160 mg, 160 mg, Oral, QHS, Sheikh, Omair Latif, DO, 160 mg at 12/25/23 2128   metoprolol  succinate (TOPROL -XL) 24 hr tablet 25 mg, 25 mg, Oral, Daily, Sheikh, Omair Latif, DO, 25 mg at 12/26/23 0906   multivitamin with minerals tablet 1 tablet, 1 tablet, Oral, Daily, Sheikh, Omair Latif, DO, 1 tablet at 12/26/23 0907   nitroGLYCERIN  (NITROSTAT ) SL tablet 0.4 mg, 0.4 mg, Sublingual, Q5 min PRN, Sheikh, Omair Latif, DO   ondansetron  (ZOFRAN ) tablet 4 mg, 4 mg, Oral, Q6H PRN **OR** ondansetron  (ZOFRAN ) injection 4 mg, 4 mg, Intravenous, Q6H PRN, Celinda Alm Lot, MD, 4 mg at 12/23/23 0444   Oral care mouth rinse, 15 mL, Mouth Rinse, PRN, Sherrill, Omair Latif, DO   rosuvastatin (CRESTOR) tablet 20 mg, 20 mg, Oral, Daily, Sheikh, Omair Latif, DO, 20 mg at 12/26/23 0908   [EXPIRED] thiamine  (VITAMIN B1) 500 mg in sodium chloride  0.9 % 50 mL IVPB, 500 mg, Intravenous, Q8H, Last Rate: 110 mL/hr at 12/26/23  9390, 500 mg at 12/26/23 9390 **FOLLOWED BY** thiamine (VITAMIN B1) 250 mg in sodium chloride  0.9 % 50 mL IVPB, 250 mg, Intravenous, Daily, Stopped at 12/26/23 1422 **FOLLOWED BY** [START ON 01/01/2024] thiamine (VITAMIN B1) injection 100 mg, 100 mg, Intravenous, Daily, Arora, Ashish, MD  Allergies: Allergies  Allergen Reactions   Augmentin [Amoxicillin-Pot Clavulanate] Nausea And Vomiting   Glipizide Other (See Comments)   Lipitor  [Atorvastatin ]     Muscle pain   Metformin Nausea And Vomiting   Sulfa Antibiotics Dermatitis   Ciprofloxacin Rash   Oxaprozin Rash    Other Reaction(s): Not available    Sharlot Becker, NP

## 2023-12-26 NOTE — Evaluation (Signed)
 Occupational Therapy Evaluation Patient Details Name: Ray Brown MRN: [id number] DOB: 1951-02-10 Today's Date: 12/26/2023   History of Present Illness   Patient is a 73 year old male who presented to the ED with c/o N/V for > 1 week.  Dx with intractable N/V in setting of GERD and Barrett's esophagus.  Since admission, patient began to exhibit visual hallucinations, sporadic paranoia, agitation, and aggressive behaviors.  Psych consult completed; patient started on Quetiapine.  Neuro following.  PMHx includes DM II, HTN, HLD, CAD (stent x2), aortic arthrosclerosis, PVD, BPPV, PNA, OSA (non-compliant w/ CPAP), Hx of smoking, GERD, Barrett's esophagus, hiatal hernia, sigmoid colon resection, OA, right shoulder scope, left RTC repair     Clinical Impressions PTA, patient was living at home with spouse and was grossly independent with self-care (with verbalized exception of donning & doffing socks) and participation in yard work.  Acute illness has impacted patient's activity tolerance and balance.  Patient has also demonstrated deficits in safety awareness which contribute to an increased risk of falls.  Patient's impulsivity and poor safety awareness may point to cognitive deficits; patient would benefit from cognitive assessment.  Patient was previously being followed by outpatient PT for treatment of BPPV.  Patient has no need for OT services after discharge.  Acute OT will continue to follow patient while in the hospital to address performance deficits described herein.  Thank you for allowing us  to participate in the care of this patient.    If plan is discharge home, recommend the following:   Supervision due to cognitive status;A little help with walking and/or transfers;A little help with bathing/dressing/bathroom     Functional Status Assessment   Patient has had a recent decline in their functional status and demonstrates the ability to make significant improvements in function  in a reasonable and predictable amount of time.     Equipment Recommendations   None recommended by OT      Precautions/Restrictions   Precautions Precautions: Fall (Patient has Dx of BPPV)     Mobility Bed Mobility Overal bed mobility: Needs Assistance Bed Mobility: Supine to Sit Supine to sit: Contact guard, HOB elevated, Used rails   Patient Response: Impulsive  Transfers Overall transfer level: Needs assistance Equipment used: Rolling walker (2 wheels) Transfers: Sit to/from Stand Sit to Stand: Contact guard assist   General transfer comment: Patient impulsive; required VC for hand & foot placement      Balance Overall balance assessment: Needs assistance Sitting-balance support: No upper extremity supported, Feet supported Sitting balance-Leahy Scale: Good   Standing balance support: Bilateral upper extremity supported, During functional activity, Reliant on assistive device for balance Standing balance-Leahy Scale: Poor Standing balance comment: Exhibited retrograde LOB    ADL either performed or assessed with clinical judgement   ADL Overall ADL's : Needs assistance/impaired Eating/Feeding: Independent   Grooming: Wash/dry hands;Contact guard assist;Standing;Cueing for safety   Upper Body Bathing: Contact guard assist;Sitting;Cueing for safety   Lower Body Bathing: Minimal assistance;Sitting/lateral leans;Cueing for safety   Upper Body Dressing : Sitting;Supervision/safety   Lower Body Dressing: Moderate assistance;Sit to/from stand;Cueing for safety   Toilet Transfer: Contact guard assist;Rolling walker (2 wheels);Cueing for safety   Toileting- Clothing Manipulation and Hygiene: Contact guard assist;Cueing for safety;Sit to/from stand   Functional mobility during ADLs: Contact guard assist;Cueing for safety;Rolling walker (2 wheels)       Vision Baseline Vision/History: 0 No visual deficits Ability to See in Adequate Light: 0  Adequate Patient Visual Report: No change from  baseline Vision Assessment?: No apparent visual deficits            Pertinent Vitals/Pain Pain Assessment Pain Assessment: No/denies pain     Extremity/Trunk Assessment Upper Extremity Assessment Upper Extremity Assessment: Overall WFL for tasks assessed   Lower Extremity Assessment Lower Extremity Assessment: Defer to PT evaluation   Cervical / Trunk Assessment Cervical / Trunk Assessment: Normal   Communication Communication Communication: No apparent difficulties   Cognition Arousal: Alert Behavior During Therapy: Impulsive, Agitated Cognition: Cognition impaired Orientation impairments: Time Awareness: Intellectual awareness impaired, Online awareness impaired Memory impairment (select all impairments): Short-term memory, Working civil service fast streamer, Conservation officer, historic buildings Attention impairment (select first level of impairment): Selective attention Executive functioning impairment (select all impairments): Reasoning, Problem solving OT - Cognition Comments: Patient became agitated while discussing D/C plans with spouse, insisting that he will be going home.  Patient required redirection to understand discussion was about maximizing safety upon return home.   Following commands: Impaired Following commands impaired: Only follows one step commands consistently     General Comments   Patient has no Dx of cognitive deficits but exhibited s/s of same; would benefit from cog assessment.           Home Living Family/patient expects to be discharged to:: Private residence Living Arrangements: Spouse/significant other Available Help at Discharge: Family;Available 24 hours/day Type of Home: House Home Access: Stairs to enter [employer] of Steps: 6 Entrance Stairs-Rails: Right;Left Home Layout: One level   Bathroom Shower/Tub: Health Visitor: Handicapped height Bathroom Accessibility: Yes How  Accessible: Accessible via walker Home Equipment: Rolling Walker (2 wheels);Cane - single point;Grab bars - tub/shower;Hand held shower head      Prior Functioning/Environment Prior Level of Function : Driving;Needs assist   Physical Assist : ADLs (physical) ADLs (physical): Dressing ADLs Comments: Patient reports grossly independent with ADLs with exception of donning & doffing socks; still mows grass with riding mower at home and church    OT Problem List: Decreased activity tolerance;Impaired balance (sitting and/or standing);Decreased cognition;Decreased safety awareness;Decreased knowledge of precautions   OT Treatment/Interventions: Self-care/ADL training;Therapeutic exercise;Energy conservation;DME and/or AE instruction;Therapeutic activities;Cognitive remediation/compensation;Patient/family education      OT Goals(Current goals can be found in the care plan section)   Acute Rehab OT Goals Patient Stated Goal: To return home OT Goal Formulation: With patient/family Time For Goal Achievement: 01/09/24 Potential to Achieve Goals: Good ADL Goals Pt Will Perform Lower Body Dressing: with modified independence;with adaptive equipment;sit to/from stand (while demonstrating appropriate safety awareness) Pt Will Transfer to Toilet: with modified independence;grab bars (while demonstrating appropriate safety awareness) Pt Will Perform Toileting - Clothing Manipulation and hygiene: with modified independence;sit to/from stand (while demonstrating appropriate safety awareness) Additional ADL Goal #1: Patient will verbalize 3 fall prevention strategies with 100% accuracy.   OT Frequency:  Min 2X/week       AM-PAC OT 6 Clicks Daily Activity     Outcome Measure Help from another person eating meals?: None Help from another person taking care of personal grooming?: A Little Help from another person toileting, which includes using toliet, bedpan, or urinal?: A Little Help from  another person bathing (including washing, rinsing, drying)?: A Little Help from another person to put on and taking off regular upper body clothing?: A Little Help from another person to put on and taking off regular lower body clothing?: A Little 6 Click Score: 19   End of Session Equipment Utilized During Treatment: Gait belt;Rolling walker (2 wheels) Nurse Communication: Mobility  status;Other (comment) (Impulsivity)  Activity Tolerance: Patient tolerated treatment well Patient left: in chair;with call bell/phone within reach;with chair alarm set;with family/visitor present  OT Visit Diagnosis: Unsteadiness on feet (R26.81);Other symptoms and signs involving cognitive function;Dizziness and giddiness (R42)                Time: 8990-8953 OT Time Calculation (min): 37 min Charges:  OT General Charges $OT Visit: 1 Visit OT Evaluation $OT Eval Moderate Complexity: 1 Mod OT Treatments $Therapeutic Activity: 8-22 mins  Dimitrius Steedman B. Juliyah Mergen, MS, OTR/L 12/26/2023, 12:47 PM

## 2023-12-27 DIAGNOSIS — K2270 Barrett's esophagus without dysplasia: Secondary | ICD-10-CM

## 2023-12-27 DIAGNOSIS — R443 Hallucinations, unspecified: Secondary | ICD-10-CM | POA: Diagnosis not present

## 2023-12-27 DIAGNOSIS — E785 Hyperlipidemia, unspecified: Secondary | ICD-10-CM | POA: Diagnosis not present

## 2023-12-27 DIAGNOSIS — R112 Nausea with vomiting, unspecified: Secondary | ICD-10-CM | POA: Diagnosis not present

## 2023-12-27 DIAGNOSIS — I739 Peripheral vascular disease, unspecified: Secondary | ICD-10-CM | POA: Diagnosis not present

## 2023-12-27 LAB — COMPREHENSIVE METABOLIC PANEL WITH GFR
ALT: 9 U/L (ref 0–44)
AST: 19 U/L (ref 15–41)
Albumin: 3.2 g/dL — ABNORMAL LOW (ref 3.5–5.0)
Alkaline Phosphatase: 56 U/L (ref 38–126)
Anion gap: 8 (ref 5–15)
BUN: 10 mg/dL (ref 8–23)
CO2: 24 mmol/L (ref 22–32)
Calcium: 9.2 mg/dL (ref 8.9–10.3)
Chloride: 108 mmol/L (ref 98–111)
Creatinine, Ser: 0.63 mg/dL (ref 0.61–1.24)
GFR, Estimated: >60 mL/min (ref >60)
Glucose, Bld: 140 mg/dL — ABNORMAL HIGH (ref 70–99)
Potassium: 4.2 mmol/L (ref 3.5–5.1)
Sodium: 140 mmol/L (ref 135–145)
Total Bilirubin: 0.4 mg/dL (ref 0.0–1.2)
Total Protein: 5.4 g/dL — ABNORMAL LOW (ref 6.5–8.1)

## 2023-12-27 LAB — CULTURE, BLOOD (ROUTINE X 2)
Culture: NO GROWTH
Culture: NO GROWTH

## 2023-12-27 LAB — CBC WITH DIFFERENTIAL/PLATELET
Abs Immature Granulocytes: 0.01 K/uL (ref 0.00–0.07)
Basophils Absolute: 0 K/uL (ref 0.0–0.1)
Basophils Relative: 0 %
Eosinophils Absolute: 0.3 K/uL (ref 0.0–0.5)
Eosinophils Relative: 4 %
HCT: 45.3 % (ref 39.0–52.0)
Hemoglobin: 14.6 g/dL (ref 13.0–17.0)
Immature Granulocytes: 0 %
Lymphocytes Relative: 15 %
Lymphs Abs: 1.1 K/uL (ref 0.7–4.0)
MCH: 30.6 pg (ref 26.0–34.0)
MCHC: 32.2 g/dL (ref 30.0–36.0)
MCV: 95 fL (ref 80.0–100.0)
Monocytes Absolute: 0.6 K/uL (ref 0.1–1.0)
Monocytes Relative: 9 %
Neutro Abs: 5.3 K/uL (ref 1.7–7.7)
Neutrophils Relative %: 72 %
Platelets: 211 K/uL (ref 150–400)
RBC: 4.77 MIL/uL (ref 4.22–5.81)
RDW: 12.7 % (ref 11.5–15.5)
WBC: 7.4 K/uL (ref 4.0–10.5)
nRBC: 0 % (ref 0.0–0.2)

## 2023-12-27 LAB — GLUCOSE, CAPILLARY
Glucose-Capillary: 123 mg/dL — ABNORMAL HIGH (ref 70–99)
Glucose-Capillary: 133 mg/dL — ABNORMAL HIGH (ref 70–99)
Glucose-Capillary: 177 mg/dL — ABNORMAL HIGH (ref 70–99)
Glucose-Capillary: 163 mg/dL — ABNORMAL HIGH (ref 70–99)

## 2023-12-27 LAB — MAGNESIUM: Magnesium: 1.5 mg/dL — ABNORMAL LOW (ref 1.7–2.4)

## 2023-12-27 LAB — PHOSPHORUS: Phosphorus: 2.1 mg/dL — ABNORMAL LOW (ref 2.5–4.6)

## 2023-12-27 MED ORDER — POTASSIUM PHOSPHATES 15 MMOLE/5ML IV SOLN
30.0000 mmol | Freq: Once | INTRAVENOUS | Status: AC
  Administered 2023-12-27: 30 mmol via INTRAVENOUS
  Filled 2023-12-27: qty 10

## 2023-12-27 MED ORDER — ALUM & MAG HYDROXIDE-SIMETH 200-200-20 MG/5ML PO SUSP
15.0000 mL | ORAL | Status: DC | PRN
  Administered 2023-12-27: 15 mL via ORAL
  Filled 2023-12-27: qty 30

## 2023-12-27 MED ORDER — CALCIUM CARBONATE ANTACID 500 MG PO CHEW: 400.0000 mg | CHEWABLE_TABLET | Freq: Three times a day (TID) | ORAL | Status: DC | PRN

## 2023-12-27 MED ORDER — MAGNESIUM SULFATE 2 GM/50ML IV SOLN
2.0000 g | Freq: Once | INTRAVENOUS | Status: AC
  Administered 2023-12-27: 2 g via INTRAVENOUS
  Filled 2023-12-27: qty 50

## 2023-12-27 MED ORDER — PANTOPRAZOLE SODIUM 40 MG PO TBEC
40.0000 mg | DELAYED_RELEASE_TABLET | Freq: Every day | ORAL | Status: DC
  Administered 2023-12-27 – 2023-12-28 (×2): 40 mg via ORAL
  Filled 2023-12-27 (×2): qty 1

## 2023-12-27 NOTE — TOC Transition Note (Signed)
 Transition of Care [facility]) - Discharge Note   Patient Details  Name: Ray [name] MRN: [id number] Date of Birth: May 20, 1950  Transition of Care [facility]) CM/SW Contact:  Bascom Service, RN Phone Number: 12/27/2023, 2:48 PM   Clinical Narrative:  Leopoldo for HHPT rep Amy accepted-no preference. Has own transport home.     Final next level of care: Home w Home Health Services Barriers to Discharge: No Barriers Identified   Patient Goals and CMS Choice Patient states their goals for this hospitalization and ongoing recovery are:: Home CMS Medicare.gov Compare Post Acute Care list provided to:: Patient Represenative (must comment) (Edith(spouse)) Choice offered to / list presented to : Patient, Spouse [place] ownership interest in [facility].provided to:: Spouse    Discharge Placement                       Discharge Plan and Services Additional resources added to the After Visit Summary for     Discharge Planning Services: CM Consult Post Acute Care Choice: Home Health                    HH Arranged: PT [facility] Agency: [facility] Date [facility] Agency Contacted: 12/27/23 Time HH Agency Contacted: [phone] Representative spoke with at [facility] Agency: Amy  Social Drivers of Health (SDOH) Interventions SDOH Screenings   Food Insecurity: No Food Insecurity (12/22/2023)  Housing: Low Risk  (12/22/2023)  Transportation Needs: No Transportation Needs (12/22/2023)  Utilities: Not At Risk (12/22/2023)  Social Connections: Socially Integrated (12/22/2023)  Tobacco Use: Medium Risk (12/22/2023)     Readmission Risk Interventions     No data to display

## 2023-12-27 NOTE — Progress Notes (Signed)
 PT Cancellation Note  Patient Details Name: Ray [name] MRN: [id number] DOB: [birth year]   Cancelled Treatment:    Reason Eval/Treat Not Completed: Other (comment) Pt declined PT in am and pm due to heart burn and just not feeling well. He had ambulated with nursing and was in the chair.  Did note hx of R posterior canal BPPV and was being treated in outpt therapy.  Will f/u as able.  Ray [name], PT Acute Rehab [facility] [phone]   Ray [name] [place] 12/27/2023, 3:33 PM

## 2023-12-27 NOTE — Consult Note (Signed)
 Ray [name]  Patient Name: [name]  MRN: [id number]  DOB: 1950/11/19  Consult Order details:  Orders (From admission, onward)     Start     Ordered   12/24/23 1206  IP CONSULT TO PSYCHIATRY       Ordering Provider: [name], DO  Provider:  (Not yet assigned)  Question Answer Comment  Location [facility]   Reason for Consult? Paranoia, Visual Hallucinations      12/24/23 1205             Mode of Visit: In person    Psychiatry Consult Evaluation  Service Date: December 27, 2023 LOS:  LOS: 4 days  Chief Complaint Seeing things, especially at night.  Primary Psychiatric Diagnoses  Hallucinations r/t delirium r/t to sleep deprivation vs anticholinergic medications  Assessment  Ray [name] is a 73 y.o. male admitted: Medicallyfor 12/22/2023  3:21 AM with medical history per Dr. Saintclair significant of osteoarthritis, spinal stenosis, bronchitis, chronic back pain, CAD, history of unstable angina episode, hypertension, history of hypertensive heart disease, hyperlipidemia, aortic atherosclerosis, peripheral vascular disease, history of pneumonia, sleep apnea on CPAP, moderate protein malnutrition, history of unspecified class obesity, type 2 diabetes, GERD, hiatal hernia, Barrett's esophagus, diverticulosis/diverticulitis, constipation, colon polyp, history of BPH who presents emergency department complaints of nausea and numerous episodes of emesis since Thursday of last week.  No psychiatric history  His current presentation of seeing things that started when he was in the ED with a current infection, none prior to this is most consistent with hallucinations under the context of delirium.  Current outpatient psychotropic medications include none. On initial examination, patient was assessed with his wife of 50 years and son at the bedside per his request.  He and the family reported the visual hallucinations  started in the ED of seeing distorted faces, his son wearing makeup when he wasn't, thinking he was standing on his bed, etc. Please see plan below for detailed recommendations.   Diagnoses:  Active Hospital problems: Principal Problem:   Intractable nausea and vomiting Active Problems:   Hallucinations   Peripheral vascular disease   Dyslipidemia   OSA (obstructive sleep apnea)   Hypokalemia   Hypomagnesemia   Essential hypertension   Gastroesophageal reflux disease without esophagitis   Barrett's esophagus    Plan   ## Psychiatric Medication Recommendations:  Continues Seroquel 50 mg at bedtime for hallucinations and sleep  ## Medical Decision Making Capacity: Not specifically addressed in this encounter  ## Further Work-[name]:  -- most recent EKG on 11/3 had QtC of 467, QtC 422 on 11/4 with normal sinus rhythm -- Pertinent labwork reviewed earlier this admission includes: CBC, chem panel, urine, EKG  ## Disposition:-- There are no psychiatric contraindications to discharge at this time  ## Behavioral / Environmental: -Delirium Precautions: Delirium Interventions for Nursing and Staff: - RN to open blinds every AM. - To Bedside: Glasses, hearing aide, and pt's own shoes. Make available to patients. when possible and encourage use. - Encourage po fluids when appropriate, keep fluids within reach. - OOB to chair with meals. - Passive ROM exercises to all extremities with AM & PM care. - RN to assess orientation to person, time and place QAM and PRN. - Recommend extended visitation hours with familiar family/friends as feasible. - Staff to minimize disturbances at night. Turn off television when pt asleep or when not in use.   ## Safety and Observation Level:  -  Based on my clinical evaluation, I estimate the patient to be at minimal risk of self harm in the current setting. - At this time, we recommend  routine. This decision is based on my review of the chart including patient's  history and current presentation, interview of the patient, mental status examination, and consideration of suicide risk including evaluating suicidal ideation, plan, intent, suicidal or self-harm behaviors, risk factors, and protective factors. This judgment is based on our ability to directly address suicide risk, implement suicide prevention strategies, and develop a safety plan while the patient is in the clinical setting. Please contact our team if there is a concern that risk level has changed.  CSSR Risk Category:C-SSRS RISK CATEGORY: No Risk  Suicide Risk Assessment: Patient has following modifiable risk factors for suicide: none Patient has following non-modifiable or demographic risk factors for suicide: male gender Patient has the following protective factors against suicide: Supportive family, no history of suicide attempts, and no history of NSSIB  Thank you for this consult request. Recommendations have been communicated to the primary team.  We will continue to follow at this time.   Ray Becker, NP       History of Present Illness  Relevant Aspects of [facility] Course:  Admitted on 12/22/2023 for N/V, consult for visual hallucinations and paranoia.   Patient Report:  The client was admitted for nausea and vomiting times one week, history of diverticulitis.  He was treated with antibiotics.  He and his family reported he started having visual hallucinations in the ED with seeing contorted faces, like his son wearing make [name] when he wasn't.  These continued with admission and increasing at night.  He is not sleeping well and his wife has been staying in his room in the recliner.  She said he was paranoid at night that people are outside of his room watching.  Last night, the client reported he thought he was standing on his bed and he wasn't.  These hallucinations are increasing his anxiety as he has never experienced these before.  Discussed his infection correlating with  his symptoms with resolution most likely when the infection resolves.  Recommendations made along with risks, including cardiovascular risks, of Seroquel or Risperdal or Zyprexa or Ativan .  Family and patient thought Seroquel would help best with his sleep and symptoms, will reassess tomorrow.    11/4: The client did not sleep much last night and his hallucinations are continuing.  Dr. Fredia reviewed the client and recommended a one time dose of Seroquel 25 mg to see if this is sleep related.  The dose was given with some clarity on the description of his hallucinations afterwards.  It's my eyes, not by brain.  It's weird. He complains of double vision and seeing things at times that are not there and when he looks elsewhere and back, the [facility] is gone.  At other times, he sees things in his room that appear to be other things.  For instance, he thought there was a large spider on the wall and it was the curled [name] tv cord.  He thought the black soap box with a smaller white box attached was the parking lot with a truck coming and told his wife to get out of the way.  His wife who is at his bedside feels his depth perception is off as he is picking at things or reaching at times for things that are not close to him.  At night, he does get  paranoid and hallucinations are worse.  He has not slept much since admission which the wife confirmed.    The psychiatrist and this provider re-evaluated his medications and eliminated the anticholinergic medications (meclizine, compazine , etc.) to see if this may be a cause of his VH r/t the cholinergic effects of these medications.  His sclera are very reddened also.   The cholinergic effects, anxiety, new environment, and lack of sleep may be contributing to his VH and illusions.  12/27/2023: The client was sitting upright in bed with his wife at his bedside.  They reported he slept well last night and has not experienced any hallucinations since yesterday.  His wife said  he was back to his old self.  This provider noted he was less slow to respond, did not have his mouth open when not talking, and engaged easily in the conversation.  He also said he has not been nauseous in the past few days which has been nice as he was awakening daily to nausea and vomiting at times during the day.  Appetite has improved.  Discussed taking only 1/2 of the Seroquel at bedtime when he is home PRN.  He occasionally has times where he cannot sleep at home.  During hospitalization with the different environment, noises, and interruptions to continue the dose inpatient.  The psych team will re-evaluate tomorrow to monitor if symptoms return.  Psych ROS:  Depression: some Anxiety:  high at times Mania (lifetime and current): none Psychosis: (lifetime and current): VH   Collateral information:  Contacted wife and son at bedside on 11/3 with patient's permission.  Information included in note above.  Review of Systems  Constitutional:  Positive for malaise/fatigue.  HENT: Negative.    Eyes: Negative.   Respiratory: Negative.    Cardiovascular: Negative.   Gastrointestinal: Negative.   Genitourinary: Negative.   Musculoskeletal: Negative.   Skin: Negative.   Neurological: Negative.   Endo/Heme/Allergies: Negative.   Psychiatric/Behavioral:  The patient is nervous/anxious.      Psychiatric and Social History  Psychiatric History:  Information collected from patient, chart, and wife at his bedside.  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 Vital Signs:  Temp:  [97.8 F (36.6 C)-98.7 F  (37.1 C)] 98.7 F (37.1 C) (11/05 1329) Pulse Rate:  [65-77] 77 (11/05 1329) Resp:  [16-20] 16 (11/05 1329) BP: (146-164)/(71-82) 164/82 (11/05 1329) SpO2:  [95 %-98 %] 98 % (11/05 1329) Blood pressure (!) 164/82, pulse 77, temperature 98.7 F (37.1 C), temperature source Oral, resp. rate 16, height 5' 8 (1.727 m), weight 71 kg, SpO2 98%. Body mass index is 23.8 kg/m.  Physical Exam  Mental Status Exam: General Appearance: Casual  Orientation:  Full (Time, Place, and Person)  Memory:  Good  Concentration:  Good  Recall:  Good  Attention Good  Eye Contact:  Good  Speech:  Normal Rate  Language:  Good  Volume:  Normal  Mood: anxious, improving  Affect:  appropriate  Thought Process:  Coherent  Thought Content:  WDL  Suicidal Thoughts:  No  Homicidal Thoughts:  No  Judgement:  Good  Insight:  Good  Psychomotor Activity:  Decreased  Akathisia:  No  Fund of Knowledge:  Good      Assets:  Housing Leisure Time Resilience Social Support Transportation  Cognition:  WNL  ADL's:  assistance needed in hospital  AIMS (if indicated):        Other History   These have been pulled in through the EMR, reviewed, and updated if appropriate.  Family History:  The patient's family history includes Colon cancer in his mother; Coronary artery disease in his brother; Heart attack in his father; Thyroid cancer in his brother.  Medical History: Past Medical History:  Diagnosis Date   Arthritis    Bronchitis    Chronic back pain    Complication of anesthesia    difficulty breathing s/p back surgery at [facility] 2013   Coronary artery disease    Diabetes mellitus without complication (HCC)    Dry skin    in the back of head-left side   GERD (gastroesophageal reflux disease)    H/O hiatal hernia    Hypercholesteremia    Peripheral vascular disease    Pneumonia    Shortness of breath    Sleep apnea    has CPAP but does not use   Unstable angina (HCC) 12/05/2014    Surgical  History: Past Surgical History:  Procedure Laterality Date   AORTA - BILATERAL FEMORAL ARTERY BYPASS GRAFT  11/27/2001   Dr Krystal Early.  Aortobifemoral bypass with a 14 x 8 Hemashield graft.   BACK SURGERY  2004   CARDIAC CATHETERIZATION  2010   x 2 stents RCA   CARDIAC CATHETERIZATION N/A 12/08/2014   Procedure: Left Heart Cath and Coronary Angiography;  Surgeon: Dorn JINNY Lesches, MD;  Location: [facility] INVASIVE CV LAB;  Service: Cardiovascular;  Laterality: N/A;   COLONOSCOPY  08/04/2009   CORONARY ANGIOPLASTY     2 stents   FOOT SURGERY  1980s   right foot   LYSIS OF ADHESION N/A 01/16/2019   Procedure: LYSIS OF ADHESIONS;  Surgeon: Sheldon Standing, MD;  Location: [facility] ORS;  Service: General;  Laterality: N/A;   PROCTOSCOPY N/A 01/16/2019   Procedure: RIGID PROCTOSCOPY;  Surgeon: Sheldon Standing, MD;  Location: [facility] ORS;  Service: General;  Laterality: N/A;   SHOULDER ARTHROSCOPY WITH SUBACROMIAL DECOMPRESSION Right 03/28/2013   Procedure: RIGHT SHOULDER ARTHROSCOPY WITH SUBACROMIAL DECOMPRESSION AND DISTAL CLAVICLE RESECTION;  Surgeon: Franky CHRISTELLA Pointer, MD;  Location: [facility] OR;  Service: Orthopedics;  Laterality: Right;   SHOULDER SURGERY     left shoulder rotator cuff repair   UPPER GI ENDOSCOPY     XI ROBOTIC ASSISTED LOWER ANTERIOR RESECTION N/A 01/16/2019   Procedure: XI ROBOTIC RESECTION OF SIGMOID COLON;  Surgeon: Sheldon Standing, MD;  Location: [facility] ORS;  Service: General;  Laterality: N/A;     Medications:   Current Facility-Administered Medications:    acetaminophen  (TYLENOL ) tablet 650 mg, 650 mg, Oral, Q6H PRN, 650 mg at 12/26/23 2035 **OR** acetaminophen  (TYLENOL ) suppository 650 mg, 650 mg, Rectal, Q6H PRN, Celinda Alm Lot, MD   aspirin  EC tablet 81 mg, 81 mg, Oral, QHS, Sheikh, Omair Latif, DO, 81 mg at 12/26/23 2144   calcium  carbonate (TUMS - dosed in mg elemental calcium ) chewable tablet 400 mg of elemental calcium , 400 mg of elemental calcium , Oral, TID PRN, Jadine Toribio SQUIBB,  MD   clopidogrel  (PLAVIX ) tablet 75 mg, 75 mg, Oral, QHS, Sheikh, Omair Latif, DO, 75 mg at 12/26/23 2144   feeding supplement (ENSURE PLUS HIGH PROTEIN) liquid  237 mL, 237 mL, Oral, TID BM, Sheikh, Omair Latif, DO, 237 mL at 12/27/23 0941   fenofibrate  tablet 160 mg, 160 mg, Oral, QHS, Sheikh, Omair Latif, [place], 160 mg at 12/26/23 2144   metoprolol  succinate (TOPROL -XL) 24 hr tablet 25 mg, 25 mg, Oral, Daily, Sheikh, Omair Latif, DO, 25 mg at 12/27/23 9060   multivitamin with minerals tablet 1 tablet, 1 tablet, Oral, Daily, Sheikh, Omair Latif, [place], 1 tablet at 12/27/23 9058   nitroGLYCERIN  (NITROSTAT ) SL tablet 0.4 mg, 0.4 mg, Sublingual, Q5 min PRN, Sheikh, Omair Latif, DO   ondansetron  (ZOFRAN ) tablet 4 mg, 4 mg, Oral, Q6H PRN **OR** ondansetron  (ZOFRAN ) injection 4 mg, 4 mg, Intravenous, Q6H PRN, Celinda Alm Lot, MD, 4 mg at 12/23/23 0444   Oral care mouth rinse, 15 mL, Mouth Rinse, PRN, Ray, Omair Latif, DO   pantoprazole  (PROTONIX ) EC tablet 40 mg, 40 mg, Oral, Daily, Jadine Toribio SQUIBB, MD, 40 mg at 12/27/23 1255   potassium PHOSPHATE  30 mmol in dextrose  5 % 500 mL infusion, 30 mmol, Intravenous, Once, Jadine Toribio SQUIBB, MD, Last Rate: 85 mL/hr at 12/27/23 1235, 30 mmol at 12/27/23 1235   QUEtiapine (SEROQUEL) tablet 50 mg, 50 mg, Oral, QHS, Nyree Applegate Y, NP, 50 mg at 12/26/23 2144   rosuvastatin (CRESTOR) tablet 20 mg, 20 mg, Oral, Daily, Sheikh, Omair Latif, DO, 20 mg at 12/27/23 0940   [EXPIRED] thiamine (VITAMIN B1) 500 mg in sodium chloride  0.9 % 50 mL IVPB, 500 mg, Intravenous, Q8H, Stopped at 12/27/23 0753 **FOLLOWED BY** thiamine (VITAMIN B1) 250 mg in sodium chloride  0.9 % 50 mL IVPB, 250 mg, Intravenous, Daily, Last Rate: 105 mL/hr at 12/27/23 0953, 250 mg at 12/27/23 0953 **FOLLOWED BY** [START ON 01/01/2024] thiamine (VITAMIN B1) injection 100 mg, 100 mg, Intravenous, Daily, Arora, Ashish, MD  Allergies: Allergies  Allergen Reactions   Augmentin [Amoxicillin-Pot  Clavulanate] Nausea And Vomiting   Glipizide Other (See Comments)   Lipitor  [Atorvastatin ]     Muscle pain   Metformin Nausea And Vomiting   Sulfa Antibiotics Dermatitis   Ciprofloxacin Rash   Oxaprozin Rash    Other Reaction(s): Not available    Ray Becker, NP

## 2023-12-27 NOTE — Progress Notes (Signed)
  Progress Note   Patient: Ray [name] FMW:[id number] DOB: Mar 15, 1950 DOA: 12/22/2023     4 DOS: the patient was seen and examined on 12/27/2023   Brief hospital course: 73 year old man complicated PMH presenting with vomiting.  Admitted for intractable nausea and vomiting.  Seen by gastroenterology with recommendation for HIDA.  Workup was generally unrevealing and recommendation was to follow-up with ENT as an outpatient to exclude inner ear process as causative agent.  Follow-up with gastroenterology as an outpatient.  Consultants Gastroenterology  Procedures/Events   Assessment and Plan: Intractable nausea and vomiting Unintentional 40-45 pound weight loss in 1 year.  Extensive testing unrevealing.  GI doubts low EF gallbladder clinically relevant.  Continue PPI daily on discharge, Zofran  sublingual on discharge. Suggest outpatient referral to ENT for possible inner ear process causing nausea and vomiting. Follow-up with general surgery as an outpatient to discuss possibility of cholecystectomy.  Dizziness/vertigo Etiology unclear.  Follow-up as an outpatient.  Outpatient referral to ENT suggested.  Hallucinations Paranoia Emotional lability MRI negative.  Neurology recommended EEG, CT head and neck and high-dose thiamine.  Workup unrevealing. Psychiatry felt was likely related to polypharmacy and anticholinergic side effects. Now resolved.  PVD Stable.  Continue Plavix , aspirin   Emphysema  Hypomagnesemia Replete.    Subjective:  Feels better Dizziness gone N/v gone Tolerating diet (would like regular food)  Physical Exam: Vitals:   12/26/23 1345 12/26/23 1957 12/27/23 0528 12/27/23 1329  BP: 139/74 (!) 146/71 (!) 146/75 (!) 164/82  Pulse: 68 76 65 77  Resp:  16 20 16   Temp: 97.8 F (36.6 C) 98.2 F (36.8 C) 97.8 F (36.6 C) 98.7 F (37.1 C)  TempSrc: Oral   Oral  SpO2: 97% 96% 95% 98%  Weight:      Height:       Physical Exam Vitals reviewed.   Constitutional:      General: He is not in acute distress.    Appearance: He is not ill-appearing or toxic-appearing.  Cardiovascular:     Rate and Rhythm: Normal rate and regular rhythm.     Heart sounds: No murmur heard. Pulmonary:     Effort: Pulmonary effort is normal. No respiratory distress.     Breath sounds: No wheezing, rhonchi or rales.  Neurological:     Mental Status: He is alert.  Psychiatric:        Mood and Affect: Mood normal.        Behavior: Behavior normal.     Data Reviewed: Basic metabolic panel unremarkable, phosphorus 2.1, replete Magnesium  1.5, replete CBC within normal limits  Family Communication: wife at bedside  Disposition: Status is: Inpatient Remains inpatient appropriate because: n/v     Time spent: 35 minutes  Author: Toribio Door, MD 12/27/2023 4:01 PM  For on call review [email or web address].

## 2023-12-28 ENCOUNTER — Ambulatory Visit: Payer: Self-pay | Admitting: Physical Therapy

## 2023-12-28 DIAGNOSIS — R443 Hallucinations, unspecified: Secondary | ICD-10-CM | POA: Diagnosis not present

## 2023-12-28 DIAGNOSIS — R42 Dizziness and giddiness: Secondary | ICD-10-CM | POA: Diagnosis not present

## 2023-12-28 DIAGNOSIS — R112 Nausea with vomiting, unspecified: Secondary | ICD-10-CM | POA: Diagnosis not present

## 2023-12-28 LAB — BASIC METABOLIC PANEL WITH GFR
Anion gap: 8 (ref 5–15)
BUN: 9 mg/dL (ref 8–23)
CO2: 25 mmol/L (ref 22–32)
Calcium: 9.6 mg/dL (ref 8.9–10.3)
Chloride: 106 mmol/L (ref 98–111)
Creatinine, Ser: 0.65 mg/dL (ref 0.61–1.24)
GFR, Estimated: >60 mL/min (ref >60)
Glucose, Bld: 133 mg/dL — ABNORMAL HIGH (ref 70–99)
Potassium: 4.3 mmol/L (ref 3.5–5.1)
Sodium: 139 mmol/L (ref 135–145)

## 2023-12-28 LAB — PHOSPHORUS: Phosphorus: 2.9 mg/dL (ref 2.5–4.6)

## 2023-12-28 LAB — VITAMIN B1
Vitamin B1 (Thiamine): 219.4 nmol/L — ABNORMAL HIGH (ref 66.5–200.0)
Vitamin B1 (Thiamine): 217.6 nmol/L — ABNORMAL HIGH (ref 66.5–200.0)

## 2023-12-28 LAB — MAGNESIUM: Magnesium: 1.6 mg/dL — ABNORMAL LOW (ref 1.7–2.4)

## 2023-12-28 LAB — GLUCOSE, CAPILLARY
Glucose-Capillary: 139 mg/dL — ABNORMAL HIGH (ref 70–99)
Glucose-Capillary: 170 mg/dL — ABNORMAL HIGH (ref 70–99)

## 2023-12-28 MED ORDER — QUETIAPINE FUMARATE 50 MG PO TABS: 50.0000 mg | ORAL_TABLET | Freq: Every evening | ORAL | 0 refills | Status: AC | PRN

## 2023-12-28 MED ORDER — MAGNESIUM OXIDE -MG SUPPLEMENT 400 (240 MG) MG PO TABS: 400.0000 mg | ORAL_TABLET | Freq: Every day | ORAL | Status: DC

## 2023-12-28 MED ORDER — MAGNESIUM OXIDE -MG SUPPLEMENT 400 (240 MG) MG PO TABS: 400.0000 mg | ORAL_TABLET | Freq: Every day | ORAL | Status: AC

## 2023-12-28 MED ORDER — MAGNESIUM SULFATE 4 GM/100ML IV SOLN
4.0000 g | Freq: Once | INTRAVENOUS | Status: AC
  Administered 2023-12-28: 4 g via INTRAVENOUS
  Filled 2023-12-28: qty 100

## 2023-12-28 NOTE — Plan of Care (Signed)
 Pt adequate for discharge after IV magnesium  completed

## 2023-12-28 NOTE — Consult Note (Signed)
 The client reported he slept well again last night and no hallucinations today or yesterday.  Wife confirmed this who is at his bedside.  Discussed taking Seroquel as needed at home and may only need 1/2 of the dose but can take the whole dose if needed.    Sharlot Becker, PMHNP

## 2023-12-28 NOTE — Discharge Summary (Addendum)
 Physician Discharge Summary   Patient: Ray Brown MRN: [id number] DOB: May 04, 1950  Admit date:     12/22/2023  Discharge date: 12/28/23  Discharge Physician: Toribio Door   PCP: Ray Kins, MD   Recommendations at discharge:   Intractable nausea and vomiting Suggest outpatient referral to ENT for possible inner ear process causing nausea and vomiting. Follow-up with general surgery as an outpatient to discuss possibility of cholecystectomy.   Dizziness/vertigo Etiology unclear.  Follow-up as an outpatient.  Outpatient referral to ENT suggested.   Hallucinations Paranoia Emotional lability Now resolved.  Continue Seroquel as needed on discharge.   Discharge Diagnoses: Principal Problem:   Intractable nausea and vomiting Active Problems:   Peripheral vascular disease   Dyslipidemia   OSA (obstructive sleep apnea)   Hypokalemia   Hypomagnesemia   Essential hypertension   Gastroesophageal reflux disease without esophagitis   Barrett's esophagus   Hallucinations Dizziness   Hospital Course: 73 year old man complicated PMH presenting with vomiting.  Admitted for intractable nausea and vomiting.  Seen by gastroenterology with recommendation for HIDA.  Workup was generally unrevealing and recommendation was to follow-up with ENT as an outpatient to exclude inner ear process as causative agent.  Follow-up with gastroenterology as an outpatient.  Consultants Gastroenterology  Procedures/Events  Intractable nausea and vomiting Unintentional 40-45 pound weight loss in 1 year.  Extensive testing unrevealing.  GI doubts low EF gallbladder clinically relevant.  Continue PPI daily on discharge  Suggest outpatient referral to ENT for possible inner ear process causing nausea and vomiting. Follow-up with general surgery as an outpatient to discuss possibility of cholecystectomy.   Dizziness/vertigo Etiology unclear.  Follow-up as an outpatient.  Outpatient referral to  ENT suggested.   Hallucinations Paranoia Emotional lability MRI negative.  Neurology recommended EEG, CT head and neck and high-dose thiamine.  Workup unrevealing. Psychiatry felt was likely related to polypharmacy and anticholinergic side effects. Now resolved.  Continue Seroquel as needed on discharge.   PVD Stable.  Continue Plavix , aspirin    Emphysema   Hypomagnesemia Repleted      Disposition: Home Diet recommendation:  Regular diet DISCHARGE MEDICATION: Allergies as of 12/28/2023       Reactions   Augmentin [amoxicillin-pot Clavulanate] Nausea And Vomiting   Glipizide Other (See Comments)   Lipitor  [atorvastatin ]    Muscle pain   Metformin Nausea And Vomiting   Sulfa Antibiotics Dermatitis   Ciprofloxacin Rash   Oxaprozin Rash   Other Reaction(s): Not available        Medication List     TAKE these medications    aspirin  EC 81 MG tablet Take 81 mg by mouth at bedtime.   clopidogrel  75 MG tablet Commonly known as: PLAVIX  Take 1 tablet (75 mg total) by mouth at bedtime.   fenofibrate  160 MG tablet Take 160 mg by mouth at bedtime.   Jardiance 25 MG Tabs tablet Generic drug: empagliflozin Take 25 mg by mouth daily.   Lancets Misc See admin instructions.   magnesium  oxide 400 (240 Mg) MG tablet Commonly known as: MAG-OX Take 1 tablet (400 mg total) by mouth daily. Start taking on: December 29, 2023   meclizine 25 MG tablet Commonly known as: ANTIVERT Take 25 mg by mouth as needed for dizziness or nausea.   metoprolol  succinate 25 MG 24 hr tablet Commonly known as: TOPROL -XL Take 1 tablet (25 mg total) by mouth daily.   MULTIVITAMIN ADULT PO Take 1 tablet by mouth daily.   nitroGLYCERIN  0.4 MG SL tablet Commonly  known as: NITROSTAT  Place 0.4 mg under the tongue every 5 (five) minutes as needed for chest pain.   ondansetron  4 MG tablet Commonly known as: ZOFRAN  Take 4-8 mg by mouth 3 (three) times daily as needed.   OneTouch Ultra  test strip Generic drug: glucose blood daily.   OneTouch Ultra test strip Generic drug: glucose blood CHECK BLOOD SUGAR ONCE DAILY   pantoprazole  40 MG tablet Commonly known as: PROTONIX  Take 40 mg by mouth at bedtime.   polyethylene glycol 17 g packet Commonly known as: MIRALAX  / GLYCOLAX  Take 17 g by mouth 2 (two) times daily.   QUEtiapine 50 MG tablet Commonly known as: SEROQUEL Take 1 tablet (50 mg total) by mouth at bedtime as needed (sleep).   rosuvastatin 20 MG tablet Commonly known as: CRESTOR Take 20 mg by mouth daily.        Contact information for follow-up providers     Ray Kins, MD. Schedule an appointment as soon as possible for a visit in 1 week(s).   Specialty: Family Medicine Contact information: [address] [place] [address] [phone]              Contact information for after-discharge care     Home Medical Care     [facility] .   Service: Home Health Services Why: HHPT Contact information: [address] [place]  [phone]                    Feels better No dizziness  Discharge Exam: Filed Weights   12/22/23 1251  Weight: 71 kg   Physical Exam Vitals reviewed.  Constitutional:      General: He is not in acute distress.    Appearance: He is not ill-appearing or toxic-appearing.  Cardiovascular:     Rate and Rhythm: Normal rate and regular rhythm.     Heart sounds: No murmur heard. Pulmonary:     Effort: Pulmonary effort is normal. No respiratory distress.     Breath sounds: No wheezing, rhonchi or rales.  Neurological:     Mental Status: He is alert.  Psychiatric:        Mood and Affect: Mood normal.        Behavior: Behavior normal.      Condition at discharge: good  The results of significant diagnostics from this hospitalization (including imaging, microbiology, ancillary and laboratory) are listed below for reference.    Imaging Studies: EEG adult Result Date: 12/26/2023 Ray Ray KIDD, MD     12/26/2023  5:59 PM Patient Name: Ray Brown MRN: [id number] Epilepsy Attending: [name] Ray Referring Physician/Provider: [name], DO Date: 12/26/2023 Duration: 23.53 mins Patient history: 73yo m with ams. EEG to evaluate for seizure Level of alertness: Awake AEDs during EEG study: None Technical aspects: This EEG study was done with scalp electrodes positioned according to the 10-20 International system of electrode placement. Electrical activity was reviewed with band pass filter of 1-70Hz , sensitivity of 7 uV/mm, display speed of 65mm/sec with a 60Hz  notched filter applied as appropriate. EEG data were recorded continuously and digitally stored.  Video monitoring was available and reviewed as appropriate. Description: The posterior dominant rhythm consists of 9-10 Hz activity of moderate voltage (25-35 uV) seen predominantly in posterior head regions, symmetric and reactive to eye opening and eye closing. There is an excessive amount of 15 to 18 Hz beta activity distributed symmetrically and diffusely. Hyperventilation  and photic stimulation were not performed.   ABNORMALITY - Excessive beta, generalized IMPRESSION: This study is within normal limits. The excessive beta activity seen in the background is most likely due to the effect of benzodiazepine and is a benign EEG pattern. No seizures or epileptiform discharges were seen throughout the recording. A normal interictal EEG does not exclude the diagnosis of epilepsy. Priyanka O Yadav   NM Hepato W/EF Result Date: 12/26/2023 EXAM: NM HEPATOBILLARY SCAN 12/25/2023 04:51:38 PM TECHNIQUE: RADIOPHARMACEUTICAL: 5.35 mCi Tc-41m mebrofenin Anterior planar images of the abdomen and pelvis were obtained over approximately 1 hour. mcg Cholecystokinin was administered intravenously over 60 minutes/Ensure plus or equivalent fatty meal was given by mouth. Dynamic  imaging of the abdomen is obtained, and quantitative analysis is performed. Patient could not remain still during the exam. COMPARISON: None available. CLINICAL HISTORY: Abdominal pain, acute, nonlocalized; Nausea and vomiting. FINDINGS: Homogenous uptake within the liver. Normal clearance of the blood pool. Appropriate excretion into the biliary system. Gallbladder fills by 20 minutes. Tracer evident in the small bowel by 30 minutes. Gallbladder ejection fraction cannot be determined adequately quantified due to patient motion. Favor low ejection fraction. Patient reported symptoms during infusion: None. IMPRESSION: 1. Gallbladder ejection fraction cannot be adequately quantified due to patient motion; a low ejection fraction is favored. 2. Patent common bile duct and cystic duct Electronically signed by: Norleen Boxer MD 12/26/2023 08:33 AM EST RP Workstation: [id number]   CT ANGIO HEAD NECK W WO CM Result Date: 12/24/2023 EXAM: CTA Head and Neck with and without Intravenous Contrast. CT Head without Intravenous Contrast. CLINICAL HISTORY: 73 year old male. Neuro deficit, acute, stroke suspected; Vertigo, central. TECHNIQUE: Axial CTA images of the head and neck performed with and without intravenous contrast. MIP reconstructed images were created and reviewed. Axial computed tomography images of the head/brain performed without intravenous contrast. Note: Per PQRS, the description of internal carotid artery percent stenosis, including 0 percent or normal exam, is based on [place] American Symptomatic Carotid Endarterectomy Trial (NASCET) criteria. Dose reduction technique was used including one or more of the following: automated exposure control, adjustment of mA and kV according to patient size, and/or iterative reconstruction. CONTRAST: With and without; 80 mL (iohexol  (OMNIPAQUE ) 350 MG/ML injection 80 mL IOHEXOL  350 MG/ML SOLN). COMPARISON: Head CT 12/22/2023. Brain MRI 12/24/2023 09:44:00 AM. FINDINGS: CT  HEAD: BRAIN: No acute intraparenchymal hemorrhage. No mass lesion. No CT evidence for acute territorial infarct. No midline shift or extra-axial collection. VENTRICLES: No hydrocephalus. ORBITS: The orbits are unremarkable. SINUSES AND MASTOIDS: The paranasal sinuses and mastoid air cells are clear. CTA NECK: COMMON CAROTID ARTERIES: Calcified aortic arch atherosclerosis. No significant brachiocephalic artery, right CCA, or left CCA atherosclerosis. No dissection or occlusion. INTERNAL CAROTID ARTERIES: Right carotid bifurcation atherosclerosis is mostly calcified, no associated stenosis. Mild additional cervical right ICA and moderate intracranial right ICA calcified plaque. Moderate distal right ICA stenosis at the distal cavernous segment or anterior genu on series 12 image 63. Mild left carotid bifurcation mostly calcified plaque without stenosis. Left ICA siphon calcified plaque is moderate but only mild left siphon stenosis results. No dissection or occlusion. VERTEBRAL ARTERIES: 4 vessel arch configuration, left vertebral artery arises directly from the arch which is a normal variant. Mild proximal right subclavian artery calcified plaque without stenosis. Mild right vertebral artery origin calcified plaque without stenosis. Right vertebral artery is mildly non dominant to the level of the normal right PICA origin. The distal right V4 segment is diminutive, the right  vertebral functionally terminates in the PICA. Left vertebral artery arises directly from the aortic arch with no origin plaque or stenosis. The left vertebral is mildly dominant, with a mildly delayed entry into the cervical transverse foramen on series 14 image 250. Left vertebral V3 and V4 segment calcified plaque with no hemodynamically significant stenosis. Normal left PICA origin. The left vertebral artery primarily supplies the basilar. No dissection or occlusion. CTA HEAD: ANTERIOR CEREBRAL ARTERIES: Left ACA A1 segment is dominant.  Ectatic anterior communicating artery. Tortuous ACA A2 segments. Bilateral ACA branches are within normal limits. Normal left posterior communicating artery origin. No significant stenosis or occlusion for ACA. No aneurysm. MIDDLE CEREBRAL ARTERIES: MCA M1 segments and tortuous MCA bifurcations are patent without stenosis. Bilateral MCA branches are within normal limits. No occlusion. No aneurysm. POSTERIOR CEREBRAL ARTERIES: Fetal type bilateral PCA origins. Normal right posterior communicating artery origin. Bilateral PCA branches are within normal limits. No significant stenosis or occlusion for PCA. No aneurysm. BASILAR ARTERY: Patent but diminutive basilar artery. No basilar stenosis. No occlusion. No aneurysm. OTHER: Major dural venous sinuses are enhancing and patent. OTHER: SOFT TISSUES: No acute finding. No masses or lymphadenopathy. BONES: Cervical spine degeneration, including facet arthropathy associated with multilevel mild degenerative cervical spondylolisthesis. No acute osseous abnormality. LUNGS AND PLEURA: Small volume retained secretions in the distal trachea. Small layering pleural effusions. Centrilobular emphysema. Upper lung atelectasis, no upper lung consolidation. IMPRESSION: 1. Atherosclerosis with Moderate distal right ICA stenosis (RICA siphon). No other significant stenosis. 2. Emphysema, Small layering pleural effusions. 3. Stable CT appearance of the brain. Electronically signed by: Helayne Hurst MD 12/24/2023 02:34 PM EST RP Workstation: [id number]   MR BRAIN WO CONTRAST Result Date: 12/24/2023 EXAM: MRI BRAIN WITHOUT CONTRAST 12/24/2023 09:58:00 AM TECHNIQUE: Multiplanar multisequence MRI of the head/brain was performed without the administration of intravenous contrast. COMPARISON: Head CT 12/22/2023 and MRI 07/27/2023. CLINICAL HISTORY: Neuro deficit, acute, stroke suspected. FINDINGS: The examination is mildly motion degraded. BRAIN AND VENTRICLES: There is no evidence of an  acute infarct, intracranial hemorrhage, mass, midline shift, hydrocephalus, or extra-axial fluid collection. There is mild cerebral atrophy. No significant white matter disease is seen for age. ORBITS: Bilateral cataract extraction. SINUSES AND MASTOIDS: Mucous retention cysts in the maxillary sinuses. Clear mastoid air cells. BONES AND SOFT TISSUES: Normal marrow signal. No acute soft tissue abnormality. IMPRESSION: 1. No acute intracranial abnormality. Electronically signed by: Dasie Hamburg MD 12/24/2023 10:21 AM EST RP Workstation: [id number]   CT ABDOMEN PELVIS W CONTRAST Result Date: 12/22/2023 EXAM: CT ABDOMEN AND PELVIS WITH CONTRAST 12/22/2023 07:25:18 AM TECHNIQUE: CT of the abdomen and pelvis was performed with the administration of 100 mL of iohexol  (OMNIPAQUE ) 350 MG/ML injection. Multiplanar reformatted images are provided for review. Automated exposure control, iterative reconstruction, and/or weight-based adjustment of the mA/kV was utilized to reduce the radiation dose to as low as reasonably achievable. COMPARISON: 07/11/2023 CLINICAL HISTORY: Abdominal pain, acute, nonlocalized. FINDINGS: LOWER CHEST: Scattered coronary and aortic plaque. LIVER: The liver is unremarkable. GALLBLADDER AND BILE DUCTS: Gallbladder is unremarkable. No biliary ductal dilatation. SPLEEN: No acute abnormality. PANCREAS: The inflammatory changes around the pancreatic tail seen previously have resolved. ADRENAL GLANDS: No acute abnormality. KIDNEYS, URETERS AND BLADDER: Multiple cortical lesions in kidneys, some of which can be characterized as simple cysts, largest 2.1 cm 15 HU right upper pole. Per consensus, no follow-up is needed for simple Bosniak type 1 and 2 renal cysts, unless the patient has a malignancy history or risk factors. No stones  in the kidneys or ureters. No hydronephrosis. No perinephric or periureteral stranding. Urinary bladder is unremarkable. GI AND BOWEL: Stomach demonstrates no acute  abnormality. Scattered sigmoid diverticula without adjacent inflammatory/edematous change. Anastomatic staple line near the rectosigmoid junction. Normal appendix. There is no bowel obstruction. PERITONEUM AND RETROPERITONEUM: No ascites. No free air. VASCULATURE: Aorta is normal in caliber. Patent aortobifem graft. LYMPH NODES: No lymphadenopathy. REPRODUCTIVE ORGANS: Moderate prostate enlargement. BONES AND SOFT TISSUES: Solid-appearing fusion L2-L5. Moderate adjacent level DJD and degenerative disc disease L1-L2. No acute osseous abnormality. No focal soft tissue abnormality. IMPRESSION: 1. No acute findings in the abdomen or pelvis. 2. Resolution of prior inflammatory changes around the pancreatic tail. 3. Moderate prostatomegaly. 4. Scattered sigmoid diverticulosis without evidence of diverticulitis. 5. Patent aortic graft. Electronically signed by: Katheleen Faes MD 12/22/2023 08:43 AM EDT RP Workstation: [id number]   CT Angio Chest PE W and/or Wo Contrast Result Date: 12/22/2023 EXAM: CTA of the Chest with contrast for PE 12/22/2023 07:25:18 AM TECHNIQUE: CTA of the chest was performed after the administration of intravenous contrast. Multiplanar reformatted images are provided for review. MIP images are provided for review. Automated exposure control, iterative reconstruction, and/or weight based adjustment of the mA/kV was utilized to reduce the radiation dose to as low as reasonably achievable. COMPARISON: 01/31/2019 CLINICAL HISTORY: Pulmonary embolism (PE) suspected, low to intermediate probability, positive D-dimer. FINDINGS: PULMONARY ARTERIES: Pulmonary arteries are adequately opacified for evaluation. No pulmonary embolism. Main pulmonary artery is normal in caliber. MEDIASTINUM: Scattered coronary atheromatous calcifications. The heart and pericardium demonstrate no acute abnormality. Scattered aortic atheromatous calcifications. Bovine variant aortic arch branch anatomy. Mild stenosis in the  proximal left subclavian artery. There is no acute abnormality of the thoracic aorta. LYMPH NODES: No mediastinal, hilar or axillary lymphadenopathy. LUNGS AND PLEURA: Pulmonary emphysema. No focal consolidation or pulmonary edema. No pleural effusion or pneumothorax. UPPER ABDOMEN: Limited images of the upper abdomen are unremarkable. SOFT TISSUES AND BONES: Vertebral endplate spurring at multiple levels in the lower thoracic spine. No acute soft tissue abnormality. IMPRESSION: 1. No evidence of pulmonary embolism. 2. Coronary and aortic plaque. 3. Pulmonary emphysema; consider evaluation for low-dose CT lung cancer screening if age 62. Electronically signed by: Katheleen Faes MD 12/22/2023 08:37 AM EDT RP Workstation: [id number]   CT Head Wo Contrast Result Date: 12/22/2023 EXAM: CT HEAD WITHOUT CONTRAST 12/22/2023 07:25:18 AM TECHNIQUE: CT of the head was performed without the administration of intravenous contrast. Automated exposure control, iterative reconstruction, and/or weight based adjustment of the mA/kV was utilized to reduce the radiation dose to as low as reasonably achievable. COMPARISON: MRI head 07/24/2023 CLINICAL HISTORY: Headache, new onset (Age >= 51y) FINDINGS: BRAIN AND VENTRICLES: No acute hemorrhage. No evidence of acute infarct. No hydrocephalus. No extra-axial collection. No mass effect or midline shift. ORBITS: No acute abnormality. SINUSES: No acute abnormality. SOFT TISSUES AND SKULL: No acute soft tissue abnormality. No skull fracture. IMPRESSION: 1. No acute intracranial abnormality. Electronically signed by: Gilmore Molt MD 12/22/2023 07:43 AM EDT RP Workstation: [id number]   DG Chest Portable 1 View Result Date: 12/22/2023 EXAM: 1 VIEW(S) XRAY OF THE CHEST 12/22/2023 04:19:00 AM COMPARISON: PA Chest with Rib Series 07/08/2019. CLINICAL HISTORY: sepsis FINDINGS: LUNGS AND PLEURA: There is a low inspiration on exam. The hypoexpanded lungs are generally clear. No focal  pulmonary opacity. No pulmonary edema. No pleural effusion. No pneumothorax. HEART AND MEDIASTINUM: Stable mediastinum with aortic atherosclerosis. The cardiac size is normal. BONES AND SOFT TISSUES: There is thoracic spondylosis. No  acute osseous abnormality. Overlying telemetry leads. IMPRESSION: 1. No acute cardiopulmonary radiographic findings. 2. Hypoinflated, with limited view of the bases. Electronically signed by: Francis Quam MD 12/22/2023 04:28 AM EDT RP Workstation: [id number]    Microbiology: Results for orders placed or performed during the hospital encounter of 12/22/23  Resp panel by RT-PCR (RSV, Flu A&B, Covid)     Status: None   Collection Time: 12/22/23  3:47 AM   Specimen: Nasal Swab  Result Value Ref Range Status   SARS Coronavirus 2 by RT PCR NEGATIVE NEGATIVE Final    Comment: (NOTE) SARS-CoV-2 target nucleic acids are NOT DETECTED.  The SARS-CoV-2 RNA is generally detectable in upper respiratory specimens during the acute phase of infection. The lowest concentration of SARS-CoV-2 viral copies this assay can detect is 138 copies/mL. A negative result does not preclude SARS-Cov-2 infection and should not be used as the sole basis for treatment or other patient management decisions. A negative result may occur with  improper specimen collection/handling, submission of specimen other than nasopharyngeal swab, presence of viral mutation(s) within the areas targeted by this assay, and inadequate number of viral copies(<138 copies/mL). A negative result must be combined with clinical observations, patient history, and epidemiological information. The expected result is Negative.  Fact Sheet for Patients:  bloggercourse.com  Fact Sheet for Healthcare Providers:  seriousbroker.it  This test is no t yet approved or cleared by the [place]  FDA and  has been authorized for detection and/or diagnosis of SARS-CoV-2  by FDA under an Emergency Use Authorization (EUA). This EUA will remain  in effect (meaning this test can be used) for the duration of the COVID-19 declaration under Section 564(b)(1) of the Act, 21 U.S.C.section 360bbb-3(b)(1), unless the authorization is terminated  or revoked sooner.       Influenza A by PCR NEGATIVE NEGATIVE Final   Influenza B by PCR NEGATIVE NEGATIVE Final    Comment: (NOTE) The Xpert Xpress SARS-CoV-2/FLU/RSV plus assay is intended as an aid in the diagnosis of influenza from Nasopharyngeal swab specimens and should not be used as a sole basis for treatment. Nasal washings and aspirates are unacceptable for Xpert Xpress SARS-CoV-2/FLU/RSV testing.  Fact Sheet for Patients: bloggercourse.com  Fact Sheet for Healthcare Providers: seriousbroker.it  This test is not yet approved or cleared by the [place]  FDA and has been authorized for detection and/or diagnosis of SARS-CoV-2 by FDA under an Emergency Use Authorization (EUA). This EUA will remain in effect (meaning this test can be used) for the duration of the COVID-19 declaration under Section 564(b)(1) of the Act, 21 U.S.C. section 360bbb-3(b)(1), unless the authorization is terminated or revoked.     Resp Syncytial Virus by PCR NEGATIVE NEGATIVE Final    Comment: (NOTE) Fact Sheet for Patients: bloggercourse.com  Fact Sheet for Healthcare Providers: seriousbroker.it  This test is not yet approved or cleared by the [place]  FDA and has been authorized for detection and/or diagnosis of SARS-CoV-2 by FDA under an Emergency Use Authorization (EUA). This EUA will remain in effect (meaning this test can be used) for the duration of the COVID-19 declaration under Section 564(b)(1) of the Act, 21 U.S.C. section 360bbb-3(b)(1), unless the authorization is terminated or revoked.  Performed at  [facility], [address], [place], [place] [address]   Blood culture (routine x 2)     Status: None   Collection Time: 12/22/23  3:58 AM   Specimen: BLOOD  Result Value Ref Range Status   Specimen  Description   Final    BLOOD LEFT ANTECUBITAL Performed at [facility], [address] [place], [place] [address]    Special Requests   Final    Blood Culture results may not be optimal due to an inadequate volume of blood received in culture bottles BOTTLES DRAWN AEROBIC AND ANAEROBIC Performed at [facility], [address] [place], [place] [address]    Culture   Final    NO GROWTH 5 DAYS Performed at [facility], [address], [place], [place] [address]    Report Status 12/27/2023 FINAL  Final  Blood culture (routine x 2)     Status: None   Collection Time: 12/22/23  4:26 AM   Specimen: BLOOD  Result Value Ref Range Status   Specimen Description   Final    BLOOD RIGHT ANTECUBITAL Performed at [facility], [address], [place], [place] [address]    Special Requests   Final    Blood Culture results may not be optimal due to an inadequate volume of blood received in culture bottles BOTTLES DRAWN AEROBIC AND ANAEROBIC Performed at [facility], [address], [place], [place] [address]    Culture   Final    NO GROWTH 5 DAYS Performed at [facility], [address] [place], [place] [address]    Report Status 12/27/2023 FINAL  Final    Labs: CBC: Recent Labs  Lab 12/23/23 0540 12/24/23 0843 12/25/23 0722 12/26/23 0521 12/27/23 0509  WBC 10.9* 6.4 6.3 6.4 7.4  NEUTROABS 8.6* 4.5 4.3 4.3 5.3  HGB 13.4 13.9 14.0 14.4 14.6  HCT 41.9 44.1 43.3 46.2 45.3  MCV 96.1 95.5 94.7 95.9 95.0  PLT 198 181 190 203 211   Basic Metabolic Panel: Recent Labs  Lab 12/24/23 0530 12/25/23 0722 12/26/23 0521 12/27/23 0509 12/28/23 0536  NA 139 142 142 140 139  K  3.4* 3.6 3.6 4.2 4.3  CL 105 108 108 108 106  CO2 26 27 26 24 25   GLUCOSE 115* 126* 121* 140* 133*  BUN 12 7* 8 10 9   CREATININE 0.64 0.54* 0.57* 0.63 0.65  CALCIUM  8.5* 8.9 9.1 9.2 9.6  MG 1.9 1.5* 1.9 1.5* 1.6*  PHOS 1.8* 1.9* 2.5 2.1* 2.9   Liver Function Tests: Recent Labs  Lab 12/23/23 0540 12/24/23 0530 12/25/23 0722 12/26/23 0521 12/27/23 0509  AST 25 28 25 24 19   ALT 7 8 8 8 9   ALKPHOS 44 45 51 56 56  BILITOT 0.9 0.5 0.5 0.5 0.4  PROT 5.3* 5.0* 5.2* 5.5* 5.4*  ALBUMIN 3.3* 3.1* 3.2* 3.3* 3.2*   CBG: Recent Labs  Lab 12/27/23 1156 12/27/23 1635 12/27/23 2042 12/28/23 0736 12/28/23 1118  GLUCAP 133* 177* 163* 139* 170*    Discharge time spent: less than 30 minutes.  Signed: Toribio Door, MD [facility] 12/28/2023

## 2023-12-28 NOTE — Progress Notes (Signed)
 Discharge instructions given to patient questions asked and answered.

## 2024-01-26 ENCOUNTER — Other Ambulatory Visit: Payer: Self-pay | Admitting: Physician Assistant

## 2024-03-05 ENCOUNTER — Encounter: Payer: Self-pay | Admitting: Neurology

## 2024-03-05 ENCOUNTER — Ambulatory Visit: Admitting: Neurology

## 2024-03-05 VITALS — BP 146/84 | HR 84 | Ht 68.0 in | Wt 175.6 lb

## 2024-03-05 DIAGNOSIS — H81391 Other peripheral vertigo, right ear: Secondary | ICD-10-CM | POA: Diagnosis not present

## 2024-03-05 NOTE — Progress Notes (Signed)
 "  NEUROLOGY FOLLOW UP OFFICE NOTE  Ray Brown [id number] 10-27-1950   Discussed the use of AI scribe software for clinical note transcription with the patient, who gave verbal consent to proceed.  History of Present Illness I had the pleasure of seeing Ray Brown in follow-up in the neurology clinic on 03/05/2024.  The patient was last seen 4 months ago for peripheral vertigo.  He is alone in the office today.  Records and images were personally reviewed where available.  Since his last visit, he went to Vestibular therapy with note of (+) Rt Dix-Hallpike test with Rt rotary upbeating nystagmus, indicative of Rt BPPV. He was admitted for nausea/vomiting on 10/31 to 11/6. Symptoms felt to be related to inner ear, he has seen ENT because he reports that the vertigo, nausea/vomiting all stopped on hospital discharge. He still gets briefly dizzy from time to time, but no vertigo. During his admission, he had hallucinations, paranoia, emotional lability. MRI brain normal, CTA head and neck showed atherosclerosis with moderate distal right ICA stenosis (RICA siphon). No other significant stenosis, EEG showed diffuse beta. Psychiatry felt symptoms likely related to polypharmacy and anticholinergic side effects. Symptoms resolved on hospital discharge, prn Seroquel  prescribed. He states he does not recall the first few days in the hospital. He denies any tinnitus, hearing loss. He uses a walking cane occasionally, no falls. He has back and bilateral hip pain, back pain radiates down his right leg more.    History on Initial Assessment 10/26/2023: This is a 74 year old right-handed man with a history of hyperlipidemia, DM2, diverticulitis s/p surgical resection, presenting for evaluation of vertigo. He recalls that during his admission for diverticulitis in 2020, right before surgery, he was in the ER and started having vertigo when he lay down. It felt like he was falling, things were moving around him. It  lasted a few seconds but felt long. If he looks down, things go crazy for a few seconds, sometimes he has come close to passing out. If he lays on left side at night, everything just goes crazy for 1-2 seconds. It does not occur laying on the right side. It can also occur when looking up or down for a long time. He states vertigo happens daily, sometimes he is sitting and has it, at times several times a day. His vision almost goes unfocused and feels a sensation of movement. When he lifts his head back, he waits a minute and symptoms resolve. He drives and does not get vertigo when driving. He feels nauseated constantly. No headaches, diplopia, dysarthria/dysphagia, hearing loss. There is occasional high-pitched tinnitus like a whistle or cricekt sound. No pulsatile tinnitus. No neck pain. He denies any prior head injuries. He is not sure mecllizine helps, he has not taken it that much and thinks it may help some. His wife notes when he had a sinus infection, vertigo seemed more constant and very bad. He was so nauseated he could not talk.   He was seen by ENT in 2021, he saw the vestibular therapist twice in 2022 with exam consistent with right posterior canalithiasis. He feels is may have helped but was cost-prohibitive. I personally reviewed brain MRI with and without contrast done 07/2023 which was normal.    PAST MEDICAL HISTORY: Past Medical History:  Diagnosis Date   Arthritis    Bronchitis    Chronic back pain    Complication of anesthesia    difficulty breathing s/p back surgery at [facility] 2013  Coronary artery disease    Diabetes mellitus without complication (HCC)    Dry skin    in the back of head-left side   GERD (gastroesophageal reflux disease)    H/O hiatal hernia    Hypercholesteremia    Peripheral vascular disease    Pneumonia    Shortness of breath    Sleep apnea    has CPAP but does not use   Unstable angina (HCC) 12/05/2014    MEDICATIONS: Medications Ordered Prior to  Encounter[1]  ALLERGIES: Allergies[2]  FAMILY HISTORY: Family History  Problem Relation Age of Onset   Colon cancer Mother    Heart attack Father        , massive   Coronary artery disease Brother    Thyroid  cancer Brother     SOCIAL HISTORY: Social History   Socioeconomic History   Marital status: Married    Spouse name: Not on file   Number of children: Not on file   Years of education: Not on file   Highest education level: Not on file  Occupational History   Occupation: retired  Tobacco Use   Smoking status: Former    Types: Cigarettes   Smokeless tobacco: Never  Vaping Use   Vaping status: Never Used  Substance and Sexual Activity   Alcohol use: No   Drug use: No   Sexual activity: Not on file  Other Topics Concern   Not on file  Social History Narrative   Living w/ wife, 1 story home 7 steps   Right hand    Coffee 2 cups a day   Social Drivers of Health   Tobacco Use: Medium Risk (12/22/2023)   Patient History    Smoking Tobacco Use: Former    Smokeless Tobacco Use: Never    Passive Exposure: Not on Actuary Strain: Not on file  Food Insecurity: No Food Insecurity (12/22/2023)   Epic    Worried About Programme Researcher, Broadcasting/film/video in the Last Year: Never true    Ran Out of Food in the Last Year: Never true  Transportation Needs: No Transportation Needs (12/22/2023)   Epic    Lack of Transportation (Medical): No    Lack of Transportation (Non-Medical): No  Physical Activity: Not on file  Stress: Not on file  Social Connections: Socially Integrated (12/22/2023)   Social Connection and Isolation Panel    Frequency of Communication with Friends and Family: Three times a week    Frequency of Social Gatherings with Friends and Family: Three times a week    Attends Religious Services: More than 4 times per year    Active Member of Clubs or Organizations: Yes    Attends Banker Meetings: 1 to 4 times per year    Marital Status:  Married  Catering Manager Violence: Not At Risk (12/22/2023)   Epic    Fear of Current or Ex-Partner: No    Emotionally Abused: No    Physically Abused: No    Sexually Abused: No  Depression (PHQ2-9): Not on file  Alcohol Screen: Not on file  Housing: Low Risk (12/22/2023)   Epic    Unable to Pay for Housing in the Last Year: No    Number of Times Moved in the Last Year: 0    Homeless in the Last Year: No  Utilities: Not At Risk (12/22/2023)   Epic    Threatened with loss of utilities: No  Health Literacy: Not on file     PHYSICAL  EXAM: Vitals:   03/05/24 1100  BP: (!) 146/84  Pulse: 84  SpO2: 98%   General: No acute distress Head:  Normocephalic/atraumatic Skin/Extremities: No rash, no edema Neurological Exam: alert and awake. No aphasia or dysarthria. Fund of knowledge is appropriate.  Attention and concentration are normal.   Cranial nerves: Pupils equal, round. Extraocular movements intact with no nystagmus. Visual fields full.  No facial asymmetry.  Motor: Bulk and tone normal, muscle strength 5/5 throughout with no pronator drift.   Finger to nose testing intact.  Gait narrow-based, no ataxia. No tremors.    IMPRESSION: This is a 74 year old right-handed man with a history of hyperlipidemia, DM2, diverticulitis s/p surgical resection, who presented with positional vertigo that had been ongoing since 2020. Vestibular therapy in 2022 and most recently in 2025 showed findings consistent with right posterior canalithiasis. He feels vestibular therapy did not really  help, however is happy to report that after hospitalization for nausea/vomiting with no clear etiology, he has had no further vertigo since 12/2023. He has had extensive testing with unremarkable brain MRI, CTA. We discussed that if symptoms recur, agree with ENT evaluation. Follow-up as needed, call for any changes.    Thank you for allowing me to participate in his care.  Please do not hesitate to call for any  questions or concerns.    Darice Shivers, M.D.   CC: Dr. Loreli      [1]  Current Outpatient Medications on File Prior to Visit  Medication Sig Dispense Refill   aspirin  EC 81 MG tablet Take 81 mg by mouth at bedtime.     clopidogrel  (PLAVIX ) 75 MG tablet TAKE 1 TABLET BY MOUTH AT BEDTIME. 90 tablet 3   fenofibrate  160 MG tablet Take 160 mg by mouth at bedtime.     glucose blood (ONETOUCH ULTRA) test strip daily.     JARDIANCE 25 MG TABS tablet Take 25 mg by mouth daily.     Lancets MISC See admin instructions.     magnesium  oxide (MAG-OX) 400 (240 Mg) MG tablet Take 1 tablet (400 mg total) by mouth daily.     meclizine  (ANTIVERT ) 25 MG tablet Take 25 mg by mouth as needed for dizziness or nausea.     metoprolol  succinate (TOPROL -XL) 25 MG 24 hr tablet TAKE 1 TABLET (25 MG TOTAL) BY MOUTH DAILY. 90 tablet 3   Multiple Vitamins-Minerals (MULTIVITAMIN ADULT PO) Take 1 tablet by mouth daily.      nitroGLYCERIN  (NITROSTAT ) 0.4 MG SL tablet Place 0.4 mg under the tongue every 5 (five) minutes as needed for chest pain.     ondansetron  (ZOFRAN ) 4 MG tablet Take 4-8 mg by mouth 3 (three) times daily as needed. (Patient not taking: Reported on 12/22/2023)     ONETOUCH ULTRA test strip CHECK BLOOD SUGAR ONCE DAILY     pantoprazole  (PROTONIX ) 40 MG tablet Take 40 mg by mouth at bedtime.     polyethylene glycol (MIRALAX  / GLYCOLAX ) 17 g packet Take 17 g by mouth 2 (two) times daily. (Patient not taking: Reported on 12/22/2023) 14 each 0   QUEtiapine  (SEROQUEL ) 50 MG tablet Take 1 tablet (50 mg total) by mouth at bedtime as needed (sleep). 90 tablet 0   rosuvastatin  (CRESTOR ) 20 MG tablet Take 20 mg by mouth daily.     No current facility-administered medications on file prior to visit.  [2]  Allergies Allergen Reactions   Augmentin [Amoxicillin-Pot Clavulanate] Nausea And Vomiting   Glipizide Other (See  Comments)   Lipitor  [Atorvastatin ]     Muscle pain   Metformin Nausea And Vomiting    Sulfa Antibiotics Dermatitis   Ciprofloxacin Rash   Oxaprozin Rash    Other Reaction(s): Not available   "

## 2024-03-05 NOTE — Patient Instructions (Signed)
 Good to see you doing better! If symptoms recur, would see an ENT (ear nose and throat) specialist. Follow-up as needed, call for any changes.

## 2024-03-14 LAB — LAB REPORT - SCANNED
A1c: 8
EGFR: 65

## 2024-03-15 ENCOUNTER — Telehealth: Payer: Self-pay | Admitting: Pharmacist

## 2024-03-15 ENCOUNTER — Other Ambulatory Visit: Payer: Self-pay | Admitting: Family Medicine

## 2024-03-15 DIAGNOSIS — M4800 Spinal stenosis, site unspecified: Secondary | ICD-10-CM

## 2024-03-15 NOTE — Progress Notes (Signed)
" ° °  03/15/2024  Patient ID: Ray Brown, male   DOB: 01/06/1951, 74 y.o.   MRN: [id number]  Called and spoke with the patient on the phone today. Discussed increased A1c of 8% and options of metformin 1 tablet daily or insulin . [name] he remembers his father taking insulin  shots every day- not ready to start that both due to the needle and daily administration. Would prefer to work on his diet. [name] his wife has put him on a more strict regimen by removing carbs, which should help.   Offered phone or office visit to discuss diet (free of charge) or referral to dietician if he prefers. Declined for now. Also discussed trying a Libre or Dexcom sample to see how his blood sugars are affected by his diet choices to assist. Would be interested in trying this. Advised I will call in 1 month to see how his readings are doing and ask about setting up a date for CGM administration in the office.  Said if the A1c is not down by next visit in May, he would consider re-starting Metformin XR 1 tablet daily- didn't remember it being too terrible.   Other concern is that imaging office called today and needed information about his groin stent from 11/27/2001. Patient couldn't find his card with that information. Will call cardiology on Monday to assist in getting this information.    Aloysius Lewis, PharmD, [facility] Phone Number: [phone]  "

## 2024-03-20 NOTE — Progress Notes (Signed)
 Called and spoke with Cardiology on the phone. Said they don't have medical records that far back and would be located in storage. Provided number for Medical records. Phone: [phone]. Fax: [phone]. Attention to: Medical Records Department. Called them and said we would have to submit a request to go that far back.

## 2024-03-28 ENCOUNTER — Inpatient Hospital Stay: Admission: RE | Admit: 2024-03-28 | Discharge: 2024-03-28 | Attending: Family Medicine | Admitting: Family Medicine

## 2024-03-28 DIAGNOSIS — M4800 Spinal stenosis, site unspecified: Secondary | ICD-10-CM

## 2024-03-28 MED ORDER — GADOPICLENOL 0.5 MMOL/ML IV SOLN
8.0000 mL | Freq: Once | INTRAVENOUS | Status: AC | PRN
  Administered 2024-03-28: 8 mL via INTRAVENOUS
